# Patient Record
Sex: Female | Born: 1950 | Race: White | Hispanic: No | Marital: Single | Smoking: Former smoker
Health system: Southern US, Community
[De-identification: ages and names within clinical notes are randomized; demographics above are authoritative.]

## PROBLEM LIST (undated history)

## (undated) DIAGNOSIS — I77810 Thoracic aortic ectasia: Secondary | ICD-10-CM

## (undated) DIAGNOSIS — J309 Allergic rhinitis, unspecified: Secondary | ICD-10-CM

## (undated) DIAGNOSIS — M1990 Unspecified osteoarthritis, unspecified site: Secondary | ICD-10-CM

## (undated) DIAGNOSIS — I25119 Atherosclerotic heart disease of native coronary artery with unspecified angina pectoris: Secondary | ICD-10-CM

## (undated) DIAGNOSIS — K3189 Other diseases of stomach and duodenum: Secondary | ICD-10-CM

## (undated) DIAGNOSIS — K219 Gastro-esophageal reflux disease without esophagitis: Secondary | ICD-10-CM

## (undated) DIAGNOSIS — Z720 Tobacco use: Secondary | ICD-10-CM

## (undated) DIAGNOSIS — E782 Mixed hyperlipidemia: Secondary | ICD-10-CM

## (undated) DIAGNOSIS — F32A Depression, unspecified: Secondary | ICD-10-CM

## (undated) DIAGNOSIS — G8929 Other chronic pain: Secondary | ICD-10-CM

## (undated) DIAGNOSIS — I10 Essential (primary) hypertension: Secondary | ICD-10-CM

## (undated) DIAGNOSIS — I493 Ventricular premature depolarization: Secondary | ICD-10-CM

## (undated) DIAGNOSIS — H269 Unspecified cataract: Secondary | ICD-10-CM

## (undated) DIAGNOSIS — J449 Chronic obstructive pulmonary disease, unspecified: Secondary | ICD-10-CM

## (undated) DIAGNOSIS — M546 Pain in thoracic spine: Secondary | ICD-10-CM

## (undated) DIAGNOSIS — M545 Low back pain: Secondary | ICD-10-CM

## (undated) DIAGNOSIS — F329 Major depressive disorder, single episode, unspecified: Secondary | ICD-10-CM

## (undated) DIAGNOSIS — I359 Nonrheumatic aortic valve disorder, unspecified: Secondary | ICD-10-CM

## (undated) DIAGNOSIS — I2510 Atherosclerotic heart disease of native coronary artery without angina pectoris: Secondary | ICD-10-CM

## (undated) DIAGNOSIS — T4145XA Adverse effect of unspecified anesthetic, initial encounter: Secondary | ICD-10-CM

## (undated) DIAGNOSIS — J189 Pneumonia, unspecified organism: Secondary | ICD-10-CM

## (undated) DIAGNOSIS — G4700 Insomnia, unspecified: Secondary | ICD-10-CM

## (undated) DIAGNOSIS — IMO0002 Reserved for concepts with insufficient information to code with codable children: Secondary | ICD-10-CM

## (undated) DIAGNOSIS — E785 Hyperlipidemia, unspecified: Secondary | ICD-10-CM

## (undated) DIAGNOSIS — M542 Cervicalgia: Secondary | ICD-10-CM

## (undated) DIAGNOSIS — F419 Anxiety disorder, unspecified: Secondary | ICD-10-CM

## (undated) DIAGNOSIS — T7840XA Allergy, unspecified, initial encounter: Secondary | ICD-10-CM

## (undated) DIAGNOSIS — F411 Generalized anxiety disorder: Secondary | ICD-10-CM

## (undated) DIAGNOSIS — T380X5A Adverse effect of glucocorticoids and synthetic analogues, initial encounter: Secondary | ICD-10-CM

## (undated) DIAGNOSIS — N3281 Overactive bladder: Secondary | ICD-10-CM

## (undated) HISTORY — DX: Low back pain: M54.5

## (undated) HISTORY — DX: Major depressive disorder, single episode, unspecified: F32.9

## (undated) HISTORY — DX: Overactive bladder: N32.81

## (undated) HISTORY — DX: Chronic obstructive pulmonary disease, unspecified: J44.9

## (undated) HISTORY — DX: Cervicalgia: M54.2

## (undated) HISTORY — DX: Unspecified osteoarthritis, unspecified site: M19.90

## (undated) HISTORY — DX: Tobacco use: Z72.0

## (undated) HISTORY — DX: Atherosclerotic heart disease of native coronary artery without angina pectoris: I25.10

## (undated) HISTORY — DX: Depression, unspecified: F32.A

## (undated) HISTORY — DX: Nonrheumatic aortic valve disorder, unspecified: I35.9

## (undated) HISTORY — DX: Anxiety disorder, unspecified: F41.9

## (undated) HISTORY — DX: Essential (primary) hypertension: I10

## (undated) HISTORY — PX: EYE SURGERY: SHX253

## (undated) HISTORY — DX: Ventricular premature depolarization: I49.3

## (undated) HISTORY — DX: Pain in thoracic spine: M54.6

## (undated) HISTORY — DX: Insomnia, unspecified: G47.00

## (undated) HISTORY — DX: Gastro-esophageal reflux disease without esophagitis: K21.9

## (undated) HISTORY — DX: Mixed hyperlipidemia: E78.2

## (undated) HISTORY — DX: Hyperlipidemia, unspecified: E78.5

## (undated) HISTORY — DX: Other chronic pain: G89.29

## (undated) HISTORY — DX: Reserved for concepts with insufficient information to code with codable children: IMO0002

## (undated) HISTORY — DX: Unspecified cataract: H26.9

## (undated) HISTORY — DX: Thoracic aortic ectasia: I77.810

## (undated) HISTORY — DX: Allergy, unspecified, initial encounter: T78.40XA

## (undated) HISTORY — DX: Atherosclerotic heart disease of native coronary artery with unspecified angina pectoris: I25.119

## (undated) HISTORY — PX: CARPAL TUNNEL RELEASE: SHX101

## (undated) HISTORY — PX: BREAST BIOPSY: SHX20

## (undated) HISTORY — DX: Allergic rhinitis, unspecified: J30.9

## (undated) HISTORY — DX: Other diseases of stomach and duodenum: K31.89

## (undated) HISTORY — DX: Adverse effect of glucocorticoids and synthetic analogues, initial encounter: T38.0X5A

## (undated) HISTORY — DX: Generalized anxiety disorder: F41.1

---
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
## 2001-04-03 DIAGNOSIS — T8859XA Other complications of anesthesia, initial encounter: Secondary | ICD-10-CM

## 2001-04-03 HISTORY — DX: Other complications of anesthesia, initial encounter: T88.59XA

## 2001-04-03 HISTORY — PX: OTHER SURGICAL HISTORY: SHX169

## 2001-12-11 ENCOUNTER — Ambulatory Visit (HOSPITAL_BASED_OUTPATIENT_CLINIC_OR_DEPARTMENT_OTHER): Admission: RE | Admit: 2001-12-11 | Discharge: 2001-12-11 | Payer: Self-pay | Admitting: Orthopedic Surgery

## 2002-06-20 ENCOUNTER — Other Ambulatory Visit: Admission: RE | Admit: 2002-06-20 | Discharge: 2002-06-20 | Payer: Self-pay | Admitting: Family Medicine

## 2003-04-20 ENCOUNTER — Ambulatory Visit (HOSPITAL_COMMUNITY): Admission: RE | Admit: 2003-04-20 | Discharge: 2003-04-20 | Payer: Self-pay | Admitting: Family Medicine

## 2003-07-07 ENCOUNTER — Encounter: Admission: RE | Admit: 2003-07-07 | Discharge: 2003-07-07 | Payer: Self-pay | Admitting: Family Medicine

## 2003-08-18 ENCOUNTER — Other Ambulatory Visit: Admission: RE | Admit: 2003-08-18 | Discharge: 2003-08-18 | Payer: Self-pay | Admitting: Family Medicine

## 2004-09-26 ENCOUNTER — Encounter: Admission: RE | Admit: 2004-09-26 | Discharge: 2004-09-26 | Payer: Self-pay | Admitting: General Surgery

## 2004-09-28 ENCOUNTER — Ambulatory Visit (HOSPITAL_BASED_OUTPATIENT_CLINIC_OR_DEPARTMENT_OTHER): Admission: RE | Admit: 2004-09-28 | Discharge: 2004-09-28 | Payer: Self-pay | Admitting: General Surgery

## 2004-09-28 ENCOUNTER — Ambulatory Visit (HOSPITAL_COMMUNITY): Admission: RE | Admit: 2004-09-28 | Discharge: 2004-09-28 | Payer: Self-pay | Admitting: General Surgery

## 2004-09-29 ENCOUNTER — Encounter (INDEPENDENT_AMBULATORY_CARE_PROVIDER_SITE_OTHER): Payer: Self-pay | Admitting: *Deleted

## 2004-12-16 ENCOUNTER — Ambulatory Visit: Payer: Self-pay | Admitting: Family Medicine

## 2004-12-20 ENCOUNTER — Encounter: Admission: RE | Admit: 2004-12-20 | Discharge: 2004-12-20 | Payer: Self-pay | Admitting: Family Medicine

## 2005-01-02 ENCOUNTER — Ambulatory Visit: Payer: Self-pay | Admitting: Family Medicine

## 2005-06-21 ENCOUNTER — Ambulatory Visit: Payer: Self-pay | Admitting: Family Medicine

## 2005-06-29 ENCOUNTER — Ambulatory Visit: Payer: Self-pay | Admitting: Family Medicine

## 2006-02-06 ENCOUNTER — Ambulatory Visit: Payer: Self-pay | Admitting: Family Medicine

## 2006-05-29 ENCOUNTER — Ambulatory Visit: Payer: Self-pay | Admitting: Family Medicine

## 2006-07-12 ENCOUNTER — Ambulatory Visit: Payer: Self-pay | Admitting: Family Medicine

## 2006-07-12 LAB — CONVERTED CEMR LAB
ALT: 18 units/L (ref 0–40)
AST: 20 units/L (ref 0–37)
Albumin: 3.7 g/dL (ref 3.5–5.2)
Alkaline Phosphatase: 54 units/L (ref 39–117)
BUN: 13 mg/dL (ref 6–23)
Basophils Absolute: 0 10*3/uL (ref 0.0–0.1)
Basophils Relative: 0.2 % (ref 0.0–1.0)
Bilirubin, Direct: 0.1 mg/dL (ref 0.0–0.3)
CO2: 29 meq/L (ref 19–32)
Calcium: 8.7 mg/dL (ref 8.4–10.5)
Chloride: 109 meq/L (ref 96–112)
Cholesterol: 108 mg/dL (ref 0–200)
Creatinine, Ser: 0.6 mg/dL (ref 0.4–1.2)
Eosinophils Absolute: 0.2 10*3/uL (ref 0.0–0.6)
Eosinophils Relative: 1.9 % (ref 0.0–5.0)
GFR calc Af Amer: 133 mL/min
GFR calc non Af Amer: 110 mL/min
Glucose, Bld: 95 mg/dL (ref 70–99)
HCT: 39.9 % (ref 36.0–46.0)
HDL: 43.4 mg/dL (ref >39.0)
Hemoglobin: 13.7 g/dL (ref 12.0–15.0)
LDL Cholesterol: 49 mg/dL (ref 0–99)
Lymphocytes Relative: 40.2 % (ref 12.0–46.0)
MCHC: 34.4 g/dL (ref 30.0–36.0)
MCV: 94.3 fL (ref 78.0–100.0)
Monocytes Absolute: 0.6 10*3/uL (ref 0.2–0.7)
Monocytes Relative: 8 % (ref 3.0–11.0)
Neutro Abs: 4 10*3/uL (ref 1.4–7.7)
Neutrophils Relative %: 49.7 % (ref 43.0–77.0)
Platelets: 348 10*3/uL (ref 150–400)
Potassium: 3.3 meq/L — ABNORMAL LOW (ref 3.5–5.1)
RBC: 4.23 M/uL (ref 3.87–5.11)
RDW: 13.4 % (ref 11.5–14.6)
Sodium: 145 meq/L (ref 135–145)
TSH: 1.85 microintl units/mL (ref 0.35–5.50)
Total Bilirubin: 0.4 mg/dL (ref 0.3–1.2)
Total CHOL/HDL Ratio: 2.5
Total Protein: 6.7 g/dL (ref 6.0–8.3)
Triglycerides: 78 mg/dL (ref 0–149)
VLDL: 16 mg/dL (ref 0–40)
WBC: 8.1 10*3/uL (ref 4.5–10.5)

## 2007-03-13 ENCOUNTER — Encounter: Payer: Self-pay | Admitting: Family Medicine

## 2007-05-08 ENCOUNTER — Ambulatory Visit: Payer: Self-pay | Admitting: Family Medicine

## 2007-05-08 DIAGNOSIS — F321 Major depressive disorder, single episode, moderate: Secondary | ICD-10-CM

## 2007-05-08 DIAGNOSIS — J209 Acute bronchitis, unspecified: Secondary | ICD-10-CM | POA: Insufficient documentation

## 2007-05-08 DIAGNOSIS — E782 Mixed hyperlipidemia: Secondary | ICD-10-CM | POA: Insufficient documentation

## 2007-05-08 DIAGNOSIS — M1990 Unspecified osteoarthritis, unspecified site: Secondary | ICD-10-CM

## 2007-05-08 DIAGNOSIS — R51 Headache: Secondary | ICD-10-CM | POA: Insufficient documentation

## 2007-05-08 DIAGNOSIS — I10 Essential (primary) hypertension: Secondary | ICD-10-CM | POA: Insufficient documentation

## 2007-05-08 DIAGNOSIS — Z8711 Personal history of peptic ulcer disease: Secondary | ICD-10-CM | POA: Insufficient documentation

## 2007-05-08 DIAGNOSIS — J309 Allergic rhinitis, unspecified: Secondary | ICD-10-CM

## 2007-05-08 DIAGNOSIS — R519 Headache, unspecified: Secondary | ICD-10-CM | POA: Insufficient documentation

## 2007-05-08 DIAGNOSIS — K219 Gastro-esophageal reflux disease without esophagitis: Secondary | ICD-10-CM

## 2007-05-08 DIAGNOSIS — Z9189 Other specified personal risk factors, not elsewhere classified: Secondary | ICD-10-CM | POA: Insufficient documentation

## 2007-05-08 DIAGNOSIS — F17200 Nicotine dependence, unspecified, uncomplicated: Secondary | ICD-10-CM

## 2007-05-08 HISTORY — DX: Allergic rhinitis, unspecified: J30.9

## 2007-05-08 HISTORY — DX: Nicotine dependence, unspecified, uncomplicated: F17.200

## 2007-05-08 HISTORY — DX: Major depressive disorder, single episode, moderate: F32.1

## 2007-05-08 HISTORY — DX: Gastro-esophageal reflux disease without esophagitis: K21.9

## 2007-07-26 ENCOUNTER — Ambulatory Visit: Payer: Self-pay | Admitting: Family Medicine

## 2007-07-26 LAB — CONVERTED CEMR LAB
Bilirubin Urine: NEGATIVE
Blood in Urine, dipstick: NEGATIVE
Glucose, Urine, Semiquant: NEGATIVE
Ketones, urine, test strip: NEGATIVE
Nitrite: NEGATIVE
Protein, U semiquant: NEGATIVE
Specific Gravity, Urine: 1.015
Urobilinogen, UA: 0.2
WBC Urine, dipstick: NEGATIVE
pH: 5.5

## 2007-08-01 ENCOUNTER — Other Ambulatory Visit: Admission: RE | Admit: 2007-08-01 | Discharge: 2007-08-01 | Payer: Self-pay | Admitting: Family Medicine

## 2007-08-01 ENCOUNTER — Ambulatory Visit: Payer: Self-pay | Admitting: Family Medicine

## 2007-08-01 ENCOUNTER — Encounter: Payer: Self-pay | Admitting: Family Medicine

## 2007-08-01 LAB — CONVERTED CEMR LAB
ALT: 16 units/L (ref 0–35)
AST: 19 units/L (ref 0–37)
Albumin: 3.9 g/dL (ref 3.5–5.2)
Alkaline Phosphatase: 61 units/L (ref 39–117)
BUN: 16 mg/dL (ref 6–23)
Basophils Absolute: 0 10*3/uL (ref 0.0–0.1)
Basophils Relative: 0 % (ref 0.0–1.0)
Bilirubin, Direct: 0.1 mg/dL (ref 0.0–0.3)
CO2: 32 meq/L (ref 19–32)
Calcium: 9.2 mg/dL (ref 8.4–10.5)
Chloride: 106 meq/L (ref 96–112)
Cholesterol: 154 mg/dL (ref 0–200)
Creatinine, Ser: 0.7 mg/dL (ref 0.4–1.2)
Eosinophils Absolute: 0.2 10*3/uL (ref 0.0–0.7)
Eosinophils Relative: 2.6 % (ref 0.0–5.0)
GFR calc Af Amer: 111 mL/min
GFR calc non Af Amer: 91 mL/min
Glucose, Bld: 106 mg/dL — ABNORMAL HIGH (ref 70–99)
HCT: 41.3 % (ref 36.0–46.0)
HDL: 48.2 mg/dL (ref >39.0)
Hemoglobin: 14.2 g/dL (ref 12.0–15.0)
LDL Cholesterol: 74 mg/dL (ref 0–99)
Lymphocytes Relative: 35.3 % (ref 12.0–46.0)
MCHC: 34.2 g/dL (ref 30.0–36.0)
MCV: 92.6 fL (ref 78.0–100.0)
Monocytes Absolute: 0.6 10*3/uL (ref 0.1–1.0)
Monocytes Relative: 7.9 % (ref 3.0–12.0)
Neutro Abs: 4.1 10*3/uL (ref 1.4–7.7)
Neutrophils Relative %: 54.2 % (ref 43.0–77.0)
Platelets: 364 10*3/uL (ref 150–400)
Potassium: 4.1 meq/L (ref 3.5–5.1)
RBC: 4.46 M/uL (ref 3.87–5.11)
RDW: 13.8 % (ref 11.5–14.6)
Sodium: 144 meq/L (ref 135–145)
TSH: 1.86 microintl units/mL (ref 0.35–5.50)
Total Bilirubin: 0.5 mg/dL (ref 0.3–1.2)
Total CHOL/HDL Ratio: 3.2
Total Protein: 7.5 g/dL (ref 6.0–8.3)
Triglycerides: 159 mg/dL — ABNORMAL HIGH (ref 0–149)
VLDL: 32 mg/dL (ref 0–40)
WBC: 7.6 10*3/uL (ref 4.5–10.5)

## 2007-08-08 ENCOUNTER — Encounter: Payer: Self-pay | Admitting: Family Medicine

## 2008-10-07 ENCOUNTER — Telehealth: Payer: Self-pay | Admitting: Family Medicine

## 2008-11-16 ENCOUNTER — Ambulatory Visit: Payer: Self-pay | Admitting: Family Medicine

## 2008-11-19 LAB — CONVERTED CEMR LAB
ALT: 11 units/L (ref 0–35)
AST: 15 units/L (ref 0–37)
Albumin: 4 g/dL (ref 3.5–5.2)
Alkaline Phosphatase: 52 units/L (ref 39–117)
BUN: 18 mg/dL (ref 6–23)
Basophils Absolute: 0 10*3/uL (ref 0.0–0.1)
Basophils Relative: 0.4 % (ref 0.0–3.0)
Bilirubin, Direct: 0 mg/dL (ref 0.0–0.3)
CO2: 27 meq/L (ref 19–32)
Calcium: 9.2 mg/dL (ref 8.4–10.5)
Chloride: 108 meq/L (ref 96–112)
Cholesterol: 208 mg/dL — ABNORMAL HIGH (ref 0–200)
Creatinine, Ser: 0.7 mg/dL (ref 0.4–1.2)
Direct LDL: 130.9 mg/dL
Eosinophils Absolute: 0.3 10*3/uL (ref 0.0–0.7)
Eosinophils Relative: 3.8 % (ref 0.0–5.0)
GFR calc non Af Amer: 90.79 mL/min (ref >60)
Glucose, Bld: 105 mg/dL — ABNORMAL HIGH (ref 70–99)
HCT: 44.5 % (ref 36.0–46.0)
HDL: 47.8 mg/dL (ref >39.00)
Hemoglobin: 15.3 g/dL — ABNORMAL HIGH (ref 12.0–15.0)
Lymphocytes Relative: 33.2 % (ref 12.0–46.0)
Lymphs Abs: 2.3 10*3/uL (ref 0.7–4.0)
MCHC: 34.3 g/dL (ref 30.0–36.0)
MCV: 92.7 fL (ref 78.0–100.0)
Monocytes Absolute: 0.5 10*3/uL (ref 0.1–1.0)
Monocytes Relative: 6.7 % (ref 3.0–12.0)
Neutro Abs: 3.8 10*3/uL (ref 1.4–7.7)
Neutrophils Relative %: 55.9 % (ref 43.0–77.0)
Platelets: 315 10*3/uL (ref 150.0–400.0)
Potassium: 4 meq/L (ref 3.5–5.1)
RBC: 4.8 M/uL (ref 3.87–5.11)
RDW: 13.9 % (ref 11.5–14.6)
Sodium: 142 meq/L (ref 135–145)
TSH: 1.49 microintl units/mL (ref 0.35–5.50)
Total Bilirubin: 0.6 mg/dL (ref 0.3–1.2)
Total CHOL/HDL Ratio: 4
Total Protein: 7.5 g/dL (ref 6.0–8.3)
Triglycerides: 176 mg/dL — ABNORMAL HIGH (ref 0.0–149.0)
VLDL: 35.2 mg/dL (ref 0.0–40.0)
WBC: 6.9 10*3/uL (ref 4.5–10.5)

## 2008-11-25 ENCOUNTER — Ambulatory Visit: Payer: Self-pay | Admitting: Family Medicine

## 2008-11-27 LAB — CONVERTED CEMR LAB
Bilirubin Urine: NEGATIVE
Glucose, Urine, Semiquant: NEGATIVE
Ketones, urine, test strip: NEGATIVE
Nitrite: NEGATIVE
Specific Gravity, Urine: 1.015
Urobilinogen, UA: 0.2
pH: 7

## 2009-04-20 ENCOUNTER — Ambulatory Visit: Payer: Self-pay | Admitting: Family Medicine

## 2009-04-20 DIAGNOSIS — J1189 Influenza due to unidentified influenza virus with other manifestations: Secondary | ICD-10-CM

## 2009-12-20 ENCOUNTER — Ambulatory Visit: Payer: Self-pay | Admitting: Family Medicine

## 2009-12-20 LAB — CONVERTED CEMR LAB
Bilirubin Urine: NEGATIVE
Blood in Urine, dipstick: NEGATIVE
Glucose, Urine, Semiquant: NEGATIVE
Ketones, urine, test strip: NEGATIVE
Nitrite: NEGATIVE
Protein, U semiquant: NEGATIVE
Specific Gravity, Urine: 1.025
Urobilinogen, UA: 0.2
pH: 5.5

## 2009-12-22 LAB — CONVERTED CEMR LAB
ALT: 14 units/L (ref 0–35)
AST: 19 units/L (ref 0–37)
Albumin: 3.9 g/dL (ref 3.5–5.2)
Alkaline Phosphatase: 54 units/L (ref 39–117)
BUN: 19 mg/dL (ref 6–23)
Basophils Absolute: 0.1 10*3/uL (ref 0.0–0.1)
Basophils Relative: 1 % (ref 0.0–3.0)
Bilirubin, Direct: 0 mg/dL (ref 0.0–0.3)
CO2: 26 meq/L (ref 19–32)
Calcium: 9.1 mg/dL (ref 8.4–10.5)
Chloride: 107 meq/L (ref 96–112)
Cholesterol: 170 mg/dL (ref 0–200)
Creatinine, Ser: 0.7 mg/dL (ref 0.4–1.2)
Eosinophils Absolute: 0.2 10*3/uL (ref 0.0–0.7)
Eosinophils Relative: 2.7 % (ref 0.0–5.0)
GFR calc non Af Amer: 93.53 mL/min (ref >60)
Glucose, Bld: 105 mg/dL — ABNORMAL HIGH (ref 70–99)
HCT: 46.3 % — ABNORMAL HIGH (ref 36.0–46.0)
HDL: 55.2 mg/dL (ref >39.00)
Hemoglobin: 15.6 g/dL — ABNORMAL HIGH (ref 12.0–15.0)
LDL Cholesterol: 88 mg/dL (ref 0–99)
Lymphocytes Relative: 41.7 % (ref 12.0–46.0)
Lymphs Abs: 3.1 10*3/uL (ref 0.7–4.0)
MCHC: 33.7 g/dL (ref 30.0–36.0)
MCV: 93.4 fL (ref 78.0–100.0)
Monocytes Absolute: 0.5 10*3/uL (ref 0.1–1.0)
Monocytes Relative: 7.4 % (ref 3.0–12.0)
Neutro Abs: 3.5 10*3/uL (ref 1.4–7.7)
Neutrophils Relative %: 47.2 % (ref 43.0–77.0)
Platelets: 283 10*3/uL (ref 150.0–400.0)
Potassium: 4 meq/L (ref 3.5–5.1)
RBC: 4.96 M/uL (ref 3.87–5.11)
RDW: 15.1 % — ABNORMAL HIGH (ref 11.5–14.6)
Sodium: 144 meq/L (ref 135–145)
TSH: 1.88 microintl units/mL (ref 0.35–5.50)
Total Bilirubin: 0.4 mg/dL (ref 0.3–1.2)
Total CHOL/HDL Ratio: 3
Total Protein: 6.7 g/dL (ref 6.0–8.3)
Triglycerides: 135 mg/dL (ref 0.0–149.0)
VLDL: 27 mg/dL (ref 0.0–40.0)
WBC: 7.4 10*3/uL (ref 4.5–10.5)

## 2010-03-07 ENCOUNTER — Telehealth: Payer: Self-pay | Admitting: Family Medicine

## 2010-05-05 NOTE — Assessment & Plan Note (Signed)
Summary: FLU-LIKE SYMPTOMS // RS   Vital Signs:  Patient profile:   60 year old female Weight:      207 pounds Temp:     98.1 degrees F oral BP sitting:   122 / 92  (left arm) Cuff size:   large  Vitals Entered By: Alfred Levins, CMA (April 20, 2009 2:38 PM) CC: achy, fever, bilateral ear pain   History of Present Illness: Here for 3 days of diffuse body aches, fevers, and a dry cough. Some nausea but no vomitting.   Allergies: 1)  ! Vicodin 2)  ! Percocet  Past History:  Past Medical History: Reviewed history from 05/08/2007 and no changes required. Depression GERD Osteoarthritis Allergic rhinitis Headache Hyperlipidemia Hypertension insomnia overactive bladder DEXA 2002 normal  Review of Systems  The patient denies anorexia, weight loss, weight gain, vision loss, decreased hearing, hoarseness, chest pain, syncope, dyspnea on exertion, peripheral edema, hemoptysis, abdominal pain, melena, hematochezia, severe indigestion/heartburn, hematuria, incontinence, genital sores, muscle weakness, suspicious skin lesions, transient blindness, difficulty walking, depression, unusual weight change, abnormal bleeding, enlarged lymph nodes, angioedema, breast masses, and testicular masses.    Physical Exam  General:  Well-developed,well-nourished,in no acute distress; alert,appropriate and cooperative throughout examination Head:  Normocephalic and atraumatic without obvious abnormalities. No apparent alopecia or balding. Eyes:  No corneal or conjunctival inflammation noted. EOMI. Perrla. Funduscopic exam benign, without hemorrhages, exudates or papilledema. Vision grossly normal. Ears:  External ear exam shows no significant lesions or deformities.  Otoscopic examination reveals clear canals, tympanic membranes are intact bilaterally without bulging, retraction, inflammation or discharge. Hearing is grossly normal bilaterally. Nose:  External nasal examination shows no deformity  or inflammation. Nasal mucosa are pink and moist without lesions or exudates. Mouth:  Oral mucosa and oropharynx without lesions or exudates.  Teeth in good repair. Neck:  No deformities, masses, or tenderness noted. Lungs:  Normal respiratory effort, chest expands symmetrically. Lungs are clear to auscultation, no crackles or wheezes. Heart:  Normal rate and regular rhythm. S1 and S2 normal without gallop, murmur, click, rub or other extra sounds. Abdomen:  Bowel sounds positive,abdomen soft and non-tender without masses, organomegaly or hernias noted.   Impression & Recommendations:  Problem # 1:  INFLUENZA (ICD-487.8)  Complete Medication List: 1)  Avapro 300 Mg Tabs (Irbesartan) .... Once daily 2)  Norvasc 5 Mg Tabs (Amlodipine besylate) .Marland Kitchen.. 1 by mouth once daily 3)  Protonix 40 Mg Tbec (Pantoprazole sodium) .... Once daily 4)  Zoloft 100 Mg Tabs (Sertraline hcl) .... 2 by mouth once daily 5)  Zyrtec Allergy 10 Mg Tabs (Cetirizine hcl) .Marland Kitchen.. 1 once daily prn 6)  Darvocet-n 100 100-650 Mg Tabs (Propoxyphene n-apap) .Marland Kitchen.. 1 by mouth four times a day as needed 7)  Lasix 20 Mg Tabs (Furosemide) .Marland Kitchen.. 1 by mouth once daily as needed 8)  Cyclobenzaprine Hcl 10 Mg Tabs (Cyclobenzaprine hcl) .Marland Kitchen.. 1 two times a day as needed 9)  Ambien 10 Mg Tabs (Zolpidem tartrate) .Marland Kitchen.. 1 by mouth at bedtime as needed 10)  Nasonex 50 Mcg/act Susp (Mometasone furoate) .... 2 sprays each nostril once daily 11)  Potassium Chloride Cr 10 Meq Tbcr (Potassium chloride) .Marland Kitchen.. 1 by mouth once daily 12)  Inderal La 160 Mg Xr24h-cap (Propranolol hcl) .... Once daily 13)  Zocor 40 Mg Tabs (Simvastatin) .... Once daily 14)  Tamiflu 75 Mg Caps (Oseltamivir phosphate) .... Two times a day  Patient Instructions: 1)  Off work today. rest , fluids.  2)  Please schedule  a follow-up appointment as needed .  Prescriptions: TAMIFLU 75 MG CAPS (OSELTAMIVIR PHOSPHATE) two times a day  #10 x 0   Entered and Authorized by:    [name] MD   Signed by:   [name] MD on 04/20/2009   Method used:   Electronically to        [id number] (ret[facility])       [address]       [place], [place]  [address]       Ph: [phone] or [phone]       Fax: [phone]   RxID:   [id number]

## 2010-05-05 NOTE — Progress Notes (Signed)
Summary: refills  Phone Note Call from Patient   Summary of Call: Refill Inderal LA  and Zoloft to [facility])  Has no insurance to get CPX. [phone] Initial call taken by: [facility] CMA AAMA,  March 07, 2010 3:08 PM  Follow-up for Phone Call        call in 6 months of both. Hopefully she can come in for just an ROV next year to see how she is doing  Follow-up by: [name] MD,  March 07, 2010 3:59 PM  Additional Follow-up for Phone Call Additional follow up Details #1::        pt notified. Additional Follow-up by: Pura Spice, RN,  March 07, 2010 5:05 PM    Additional Follow-up for Phone Call Additional follow up Details #2::    number given not accepting mess. work number --no longer employed there and home number not in service  Follow-up by: Pura Spice, RN,  March 07, 2010 5:07 PM  New/Updated Medications: ZOLOFT 100 MG TABS (SERTRALINE HCL) 2 by mouth once daily INDERAL LA 160 MG XR24H-CAP (PROPRANOLOL HCL) once daily Prescriptions: INDERAL LA 160 MG XR24H-CAP (PROPRANOLOL HCL) once daily  #30 x 5   Entered by:   Pura Spice, RN   Authorized by:   [name] MD   Signed by:   Pura Spice, RN on 03/07/2010   Method used:   Electronically to        [id number] (ret[facility])       [address]       [place], [place]  [address]       Ph: [phone] or [phone]       Fax: [phone]   RxID:   [id number] ZOLOFT 100 MG TABS (SERTRALINE HCL) 2 by mouth once daily  #60 x 5   Entered by:   Pura Spice, RN   Authorized by:   [name] MD   Signed by:   Pura Spice, RN on 03/07/2010   Method used:   Electronically to        [id number] (retail)       [address]       [place], [place]  [address]       Ph: [phone] or [phone]       Fax: [phone]   RxID:   [id number]   Appended Document: refills pt notifed with above number.

## 2010-08-17 ENCOUNTER — Telehealth: Payer: Self-pay | Admitting: Family Medicine

## 2010-08-17 NOTE — Telephone Encounter (Signed)
Has been sick since last Wednesday, with a low grade fever,congestion and a cough. She is requesting [facility] Patient does not have insurance, and cannot afford to come in. Please advise.

## 2010-08-18 NOTE — Telephone Encounter (Signed)
Call in a Zpack  ?

## 2010-08-18 NOTE — Telephone Encounter (Signed)
rx called into pharmacy

## 2010-08-19 NOTE — Assessment & Plan Note (Signed)
[facility]                            [facility] OFFICE NOTE   NAME:[name], [name] [name]                       MRN:          [id number]  DATE:07/12/2006                            DOB:          15-Jul-1950    This is a 60 year old woman here for a nongynecological physical  examination. Generally she is doing well and has no complaints. She had  been seeing Dr. Katrinka Blazing for gynecologic examinations. Unfortunately, she  has not seen her since 2005. She did have a normal mammogram in 2007  otherwise she had a normal cardiac stress test in January 2005. She has  had several colonoscopies per Dr. Juanda Chance, the last one being in 2005,  some adenomatous polyps were removed at that time. A one year followup  colonoscopy was recommended by Dr. Juanda Chance, unfortunately she has not  been back since. On multiple occasions over the last several years, I  have tried to set her up for a repeat colonoscopy but for one reason or  another she keeps canceling it.   For further details of her past medical history, family history, social  history, habits, etc., refer to her last physical note dated January 02, 2005.   ALLERGIES:  HYDROCODONE, PERCOCET.   CURRENT MEDICATIONS:  1. Avapro 300 mg per day.  2. Norvasc 5 mg per day.  3. Inderal LA 160 mg per day.  4. Protonix 40 mg per day.  5. Celebrex 200 mg per day.  6. Lipitor 20 mg per day.  7. Zoloft 100 mg 2 tablets per day.  8. Zyrtec 10 mg per day as needed for allergies.  9. Darvocet as needed for joint pain.  10.Lasix 20 mg once a day as needed for edema.  11.Cyclobenzaprine 10 mg t.i.d. as needed for muscle spasms.  12.Ambien 10 mg q.h.s. as needed for sleep.   OBJECTIVE:  VITAL SIGNS:  Weight 211, blood pressure 150/92, pulse 80  and regular.  GENERAL:  She remains overweight.  SKIN:  Clear.  HEENT:  Eyes clear. Pharynx clear.  NECK:  Supple without lymphadenopathy or masses.  LUNGS:  Clear.  CARDIAC:  Rate and  rhythm regular without gallops, murmurs or rubs.  Distal pulses are full. EKG is within normal limits.  ABDOMEN:  Soft, normal bowel sounds, nontender, no masses.  EXTREMITIES:  No clubbing, cyanosis or edema.  NEUROLOGIC:  Grossly intact.   ASSESSMENT/PLAN:  1. Complete physical. Will send her for the usual laboratories today      and I encouraged her to lose weight.  2. Hyperlipidemia. Will check a fasting lipid panel today.  3. Health maintenance. I encouraged her to contact Dr. Michaelle Copas office      to get a GYN exam soon.  4. History of colon polyps. Will try yet again to set her up for a      repeat colonoscopy with Dr. Juanda Chance.  5. Hypertension. Probably stable. For some reason today it is up,      possibly because she has not taken her medications yet. She will      continue to watch  it closely at home.  6. Overactive bladder, stable. The patient stopped taking Vesicare at      one point because of dry mouth. At this point she simply wants to      put up with her symptoms.  7. Chronic back pain, stable.  8. Anxiety, stable.     Tera Mater. Clent Ridges, MD  Electronically Signed    SAF/MedQ  DD: 07/13/2006  DT: 07/13/2006  Job #: [id number]

## 2010-08-19 NOTE — Op Note (Signed)
[name] [name], [name] [name]                ACCOUNT NO.:  [id number]   MEDICAL RECORD NO.:  [id number]          PATIENT TYPE:  AMB   LOCATION:  [facility]                          FACILITY:  [facility]   PHYSICIAN:  Sharlet Salina T. Hoxworth, M.D.DATE OF BIRTH:  [birth year]   DATE OF PROCEDURE:  09/28/2004  DATE OF DISCHARGE:                                 OPERATIVE REPORT   PREOPERATIVE DIAGNOSIS:  Abnormal mammogram/intraductal papilloma left  breast.   PREOPERATIVE DIAGNOSIS:  Abnormal mammogram/intraductal papilloma left  breast.   SURGICAL PROCEDURES:  Needle localized left breast biopsy.   SURGEON:  Lorne Skeens. Hoxworth, M.D.   ANESTHESIA:  General.   BRIEF HISTORY:  Ms. Kallen is a 60 year old female followed by Dr. Jeralyn Ruths for small area calcifications in the left breast. Recent mammogram  so some increase in the calcifications a large core needle biopsy was  performed. 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 With this diagnosis, it was felt that complete excision of  the papilloma was recommended and the patient is in agreement. Following  successful needle localization by Dr. Yolanda Bonine, the patient was brought the  operating room for excision.   DESCRIPTION OF PROCEDURE:  Patient was brought to the operating room, placed  in the supine position on the operating table and general anesthesia was  induced. Left breast was sterilely prepped and draped. A curvilinear  incision was made at the wire insertion site in the lateral left breast and  dissection carried down through the subcutaneous tissue to the breast  capsule. A core of breast tissue was then excised around the shaft and tip  of the wire with cautery.  Specimen mammography was performed which showed complete excision of the  area. Hemostasis was obtained with cautery. The skin was then closed with  running subcuticular  4-0 Monocryl and Steri-Strips.  Sponge, needle and  instrument counts  were correct Dry sterile dressings were applied. The  patient taken to the recovery room in good condition.       BTH/MEDQ  D:  09/28/2004  T:  09/28/2004  Job:  [id number]   cc:   Netty Starring, MD

## 2010-09-07 ENCOUNTER — Telehealth: Payer: Self-pay | Admitting: Family Medicine

## 2010-09-07 NOTE — Telephone Encounter (Signed)
Pt called and said that [facility] sent a refill req on Monday, and have not rcvd response. Pt req refill of generic Zoloft 100 mg bid and generic Enderal 160 mg LA 1 a day. Pls call in today.

## 2010-09-08 ENCOUNTER — Telehealth: Payer: Self-pay | Admitting: *Deleted

## 2010-09-08 MED ORDER — SERTRALINE HCL 100 MG PO TABS: ORAL_TABLET | ORAL | Status: DC

## 2010-09-08 NOTE — Telephone Encounter (Signed)
Pt needs OV then I can call in 1 mth supply

## 2010-09-09 MED ORDER — PROPRANOLOL HCL ER 160 MG PO CP24: 160.0000 mg | ORAL_CAPSULE | Freq: Every day | ORAL | Status: DC

## 2010-09-09 MED ORDER — SERTRALINE HCL 100 MG PO TABS: 100.0000 mg | ORAL_TABLET | Freq: Two times a day (BID) | ORAL | Status: DC

## 2010-09-09 NOTE — Telephone Encounter (Signed)
Call in Zoloft 100 mg to take 2 tabs a day, #60 with 5 rf, also Inderal LA 160 mg per day, #30 with 5 rf. She will absolutely need to see me for  any more refills after that

## 2010-09-09 NOTE — Telephone Encounter (Signed)
Is this okay to fill? 

## 2010-09-09 NOTE — Telephone Encounter (Signed)
Pt called to check on status of Zoloft and Enderal LA. This needs to be called in to [facility] According to pts med list, zoloft was sent to incorrect pharmacy location.Pt unable to sch ov at this time, because she does not have insurance and can't afford ov. Meds are expensive enough.

## 2010-09-12 ENCOUNTER — Ambulatory Visit (INDEPENDENT_AMBULATORY_CARE_PROVIDER_SITE_OTHER): Payer: Self-pay | Admitting: Internal Medicine

## 2010-09-12 ENCOUNTER — Telehealth: Payer: Self-pay | Admitting: *Deleted

## 2010-09-12 ENCOUNTER — Encounter: Payer: Self-pay | Admitting: Internal Medicine

## 2010-09-12 DIAGNOSIS — I10 Essential (primary) hypertension: Secondary | ICD-10-CM

## 2010-09-12 DIAGNOSIS — F329 Major depressive disorder, single episode, unspecified: Secondary | ICD-10-CM

## 2010-09-12 MED ORDER — PROPRANOLOL HCL ER 160 MG PO CP24: 160.0000 mg | ORAL_CAPSULE | Freq: Every day | ORAL | Status: DC

## 2010-09-12 MED ORDER — LISINOPRIL-HYDROCHLOROTHIAZIDE 20-12.5 MG PO TABS: 1.0000 | ORAL_TABLET | Freq: Every day | ORAL | Status: DC

## 2010-09-12 MED ORDER — SERTRALINE HCL 100 MG PO TABS: 200.0000 mg | ORAL_TABLET | Freq: Every day | ORAL | Status: DC

## 2010-09-12 MED ORDER — SIMVASTATIN 40 MG PO TABS: 40.0000 mg | ORAL_TABLET | Freq: Every day | ORAL | Status: DC

## 2010-09-12 NOTE — Progress Notes (Signed)
  Subjective:    Patient ID: [name] [name], female    DOB: 12/04/1950, 60 y.o.   MRN: [id number]  HPI  60 year old patient who is seen today for followup of her hypertension she has a history of depression. She has been out of all her medications and is seen today basically for a medicine refill and office evaluation. She presently is without health insurance. She has been on multiple medications including Inderal. This was prescribed for migraine prophylaxis as well as for palpitations. She requests a refill on Inderal and Zoloft but due to cost concerns does not feel she can afford other medications. Blood pressure today is elevated but has been off all blood pressure medication for several days    Review of Systems  Constitutional: Negative.   HENT: Negative for hearing loss, congestion, sore throat, rhinorrhea, dental problem, sinus pressure and tinnitus.   Eyes: Negative for pain, discharge and visual disturbance.  Respiratory: Negative for cough and shortness of breath.   Cardiovascular: Negative for chest pain, palpitations and leg swelling.  Gastrointestinal: Negative for nausea, vomiting, abdominal pain, diarrhea, constipation, blood in stool and abdominal distention.  Genitourinary: Negative for dysuria, urgency, frequency, hematuria, flank pain, vaginal bleeding, vaginal discharge, difficulty urinating, vaginal pain and pelvic pain.  Musculoskeletal: Negative for joint swelling, arthralgias and gait problem.  Skin: Negative for rash.  Neurological: Negative for dizziness, syncope, speech difficulty, weakness, numbness and headaches.  Hematological: Negative for adenopathy.  Psychiatric/Behavioral: Negative for behavioral problems, dysphoric mood and agitation. The patient is not nervous/anxious.        Objective:   Physical Exam  Constitutional: She is oriented to person, place, and time. She appears well-developed and well-nourished.  HENT:  Head: Normocephalic.  Right Ear:  External ear normal.  Left Ear: External ear normal.  Mouth/Throat: Oropharynx is clear and moist.  Eyes: Conjunctivae and EOM are normal. Pupils are equal, round, and reactive to light.  Neck: Normal range of motion. Neck supple. No thyromegaly present.  Cardiovascular: Normal rate, regular rhythm, normal heart sounds and intact distal pulses.   Pulmonary/Chest: Effort normal and breath sounds normal.  Abdominal: Soft. Bowel sounds are normal. She exhibits no mass. There is no tenderness.  Musculoskeletal: Normal range of motion.  Lymphadenopathy:    She has no cervical adenopathy.  Neurological: She is alert and oriented to person, place, and time.  Skin: Skin is warm and dry. No rash noted.  Psychiatric: She has a normal mood and affect. Her behavior is normal.          Assessment & Plan:   Hypertension. We'll resume Inderal. Patient has required triple therapy in the past Will also provide a prescription for lisinopril hydrochlorothiazide Depression. We'll resume sertraline  Return office visit 3 months

## 2010-09-12 NOTE — Patient Instructions (Signed)
Limit your sodium (Salt) intake  Please check your blood pressure on a regular basis.  If it is consistently greater than 150/90, please make an office appointment.  Follow up with Dr. Clent Ridges next month

## 2010-09-12 NOTE — Telephone Encounter (Signed)
error 

## 2010-10-20 NOTE — Telephone Encounter (Signed)
Take care of

## 2011-10-03 ENCOUNTER — Other Ambulatory Visit: Payer: Self-pay | Admitting: Family Medicine

## 2011-10-06 ENCOUNTER — Other Ambulatory Visit: Payer: Self-pay

## 2011-10-06 MED ORDER — PROPRANOLOL HCL ER 160 MG PO CP24: 160.0000 mg | ORAL_CAPSULE | Freq: Every day | ORAL | Status: DC

## 2011-10-20 ENCOUNTER — Other Ambulatory Visit: Payer: Self-pay | Admitting: Internal Medicine

## 2011-10-20 NOTE — Telephone Encounter (Signed)
Can we refill this? 

## 2011-10-20 NOTE — Telephone Encounter (Signed)
Dr Fry pt 

## 2011-10-20 NOTE — Telephone Encounter (Signed)
Call in #30 only. She needs an OV  

## 2011-10-25 ENCOUNTER — Other Ambulatory Visit: Payer: Self-pay | Admitting: Family Medicine

## 2011-10-26 NOTE — Telephone Encounter (Signed)
Call in #30 only. She needs an OV  

## 2011-10-26 NOTE — Telephone Encounter (Signed)
Please see note from pharmacy

## 2011-10-27 ENCOUNTER — Other Ambulatory Visit: Payer: Self-pay | Admitting: Family Medicine

## 2011-10-30 ENCOUNTER — Telehealth: Payer: Self-pay | Admitting: Family Medicine

## 2011-10-30 NOTE — Telephone Encounter (Signed)
Pt called and said that she does not have health insurance and can not afford ov at this time. Pt req to get a 90 day supply of lisinopril-hydrochlorothiazide (PRINZIDE,ZESTORETIC) 20-12.5 MG per tablet to [facility] [address] [facility]  Pt says 90 day supply is only $12.00, instead of $30.00 for 30 day supply.

## 2011-10-31 ENCOUNTER — Other Ambulatory Visit: Payer: Self-pay | Admitting: Family Medicine

## 2011-10-31 NOTE — Telephone Encounter (Signed)
Can you call pt and give the below message? 

## 2011-10-31 NOTE — Telephone Encounter (Signed)
Called and lft vm that pt will need to sch ov, before any more refills can be given.

## 2011-10-31 NOTE — Telephone Encounter (Signed)
NO refills without an OV  

## 2011-11-06 ENCOUNTER — Encounter: Payer: Self-pay | Admitting: Family Medicine

## 2011-11-06 ENCOUNTER — Ambulatory Visit (INDEPENDENT_AMBULATORY_CARE_PROVIDER_SITE_OTHER): Payer: Self-pay | Admitting: Family Medicine

## 2011-11-06 VITALS — BP 148/94 | HR 79 | Temp 98.8°F | Wt 205.0 lb

## 2011-11-06 DIAGNOSIS — I10 Essential (primary) hypertension: Secondary | ICD-10-CM

## 2011-11-06 DIAGNOSIS — F411 Generalized anxiety disorder: Secondary | ICD-10-CM

## 2011-11-06 DIAGNOSIS — J449 Chronic obstructive pulmonary disease, unspecified: Secondary | ICD-10-CM

## 2011-11-06 DIAGNOSIS — F419 Anxiety disorder, unspecified: Secondary | ICD-10-CM

## 2011-11-06 MED ORDER — LISINOPRIL-HYDROCHLOROTHIAZIDE 20-12.5 MG PO TABS: 1.0000 | ORAL_TABLET | Freq: Every day | ORAL | Status: DC

## 2011-11-06 MED ORDER — METOPROLOL SUCCINATE ER 100 MG PO TB24: 100.0000 mg | ORAL_TABLET | Freq: Every day | ORAL | Status: DC

## 2011-11-06 MED ORDER — ALPRAZOLAM 1 MG PO TABS: 1.0000 mg | ORAL_TABLET | Freq: Two times a day (BID) | ORAL | Status: AC | PRN

## 2011-11-06 MED ORDER — ALBUTEROL SULFATE HFA 108 (90 BASE) MCG/ACT IN AERS: 2.0000 | INHALATION_SPRAY | Freq: Four times a day (QID) | RESPIRATORY_TRACT | Status: DC | PRN

## 2011-11-06 NOTE — Progress Notes (Signed)
  Subjective:    Patient ID: [name] [name], female    DOB: 1951-01-10, 61 y.o.   MRN: [id number]  HPI Here to follow up after not seeing me for 2 and 1/ 2 years. She has no insurance so she is trying not to come in. She ran out of BP meds a week ago, but she says they had been working well before then. She checks this at work and it usually runs 120s or 130s over 80s. She stopped taking Zoloft because it is too expensive, so she asks if we can try something on a prn basis. She [facility]. She still smokes and refuses to quit, abut she is getting more SOB all the time. She checks her oxygen at work and it runs 90-93 % most of the time. Again she refuses to get a CXR or any other type of testing for this.    Review of Systems  Constitutional: Negative.   Respiratory: Positive for shortness of breath. Negative for cough and wheezing.   Cardiovascular: Negative.   Psychiatric/Behavioral: The patient is nervous/anxious.        Objective:   Physical Exam  Constitutional: She is oriented to person, place, and time. She appears well-developed and well-nourished.  Neck: No thyromegaly present.  Cardiovascular: Normal rate, regular rhythm, normal heart sounds and intact distal pulses.   Pulmonary/Chest: Effort normal and breath sounds normal.  Lymphadenopathy:    She has no cervical adenopathy.  Neurological: She is alert and oriented to person, place, and time.  Psychiatric: She has a normal mood and affect.          Assessment & Plan:  She almost certainly has COPD but she will not quit smoking. I gave her a sample for Proair inhaler to use prn. Switched her HTN meds to lisinopril HCT and Metoprolol. Try Xanax prn. Recheck in one month

## 2011-11-28 ENCOUNTER — Telehealth: Payer: Self-pay | Admitting: Family Medicine

## 2011-11-28 NOTE — Telephone Encounter (Signed)
Pt would like to switch from Toprol XL to Metoprolol Tartrate due to cost savings.

## 2011-11-29 NOTE — Telephone Encounter (Signed)
Okay. Stop the Metoprolol succinate and switch to Metoprolol tartrate 50 mg bid. Call in one year supply

## 2011-11-30 MED ORDER — METOPROLOL TARTRATE 50 MG PO TABS: 50.0000 mg | ORAL_TABLET | Freq: Two times a day (BID) | ORAL | Status: DC

## 2011-11-30 NOTE — Telephone Encounter (Signed)
I sent new script e-scribe and I tried to call pt, no answer or option to leave a message.

## 2012-11-06 ENCOUNTER — Other Ambulatory Visit: Payer: Self-pay | Admitting: Family Medicine

## 2012-11-07 NOTE — Telephone Encounter (Signed)
Okay for 30 days only. Needs an OV  

## 2012-11-19 ENCOUNTER — Ambulatory Visit (INDEPENDENT_AMBULATORY_CARE_PROVIDER_SITE_OTHER): Payer: Self-pay | Admitting: Family Medicine

## 2012-11-19 ENCOUNTER — Encounter: Payer: Self-pay | Admitting: Family Medicine

## 2012-11-19 VITALS — BP 130/90 | HR 111 | Temp 98.5°F | Wt 192.0 lb

## 2012-11-19 DIAGNOSIS — I10 Essential (primary) hypertension: Secondary | ICD-10-CM

## 2012-11-19 DIAGNOSIS — M1990 Unspecified osteoarthritis, unspecified site: Secondary | ICD-10-CM

## 2012-11-19 LAB — CBC WITH DIFFERENTIAL/PLATELET
Basophils Absolute: 0.1 10*3/uL (ref 0.0–0.1)
Eosinophils Absolute: 0.2 10*3/uL (ref 0.0–0.7)
HCT: 45.3 % (ref 36.0–46.0)
Hemoglobin: 15.5 g/dL — ABNORMAL HIGH (ref 12.0–15.0)
Lymphs Abs: 4.4 10*3/uL — ABNORMAL HIGH (ref 0.7–4.0)
MCHC: 34.3 g/dL (ref 30.0–36.0)
Monocytes Relative: 6.7 % (ref 3.0–12.0)
Neutro Abs: 4.5 10*3/uL (ref 1.4–7.7)
RDW: 14.2 % (ref 11.5–14.6)

## 2012-11-19 LAB — BASIC METABOLIC PANEL
BUN: 20 mg/dL (ref 6–23)
CO2: 27 mEq/L (ref 19–32)
Chloride: 105 mEq/L (ref 96–112)
Glucose, Bld: 96 mg/dL (ref 70–99)
Potassium: 3.5 mEq/L (ref 3.5–5.1)

## 2012-11-19 MED ORDER — ALPRAZOLAM 1 MG PO TABS: 1.0000 mg | ORAL_TABLET | Freq: Two times a day (BID) | ORAL | Status: DC

## 2012-11-19 MED ORDER — METOPROLOL TARTRATE 50 MG PO TABS: 50.0000 mg | ORAL_TABLET | Freq: Three times a day (TID) | ORAL | Status: DC

## 2012-11-19 MED ORDER — LISINOPRIL-HYDROCHLOROTHIAZIDE 10-12.5 MG PO TABS: 1.0000 | ORAL_TABLET | Freq: Every day | ORAL | Status: DC

## 2012-11-19 MED ORDER — HYDROCODONE-ACETAMINOPHEN 5-325 MG PO TABS: 1.0000 | ORAL_TABLET | Freq: Four times a day (QID) | ORAL | Status: DC | PRN

## 2012-11-19 NOTE — Progress Notes (Signed)
  Subjective:    Patient ID: [name] [name], female    DOB: 19-Aug-1950, 62 y.o.   MRN: [id number]  HPI Here to follow up. Her BP has been stable but her heart rate remains high. Sometimes she feels her chest pounding. No chest pain or SOB. She asks for some pain meds for her neck and lower back. She still has no insurance and wants [place] to do the minimum of testing we can.    Review of Systems  Constitutional: Negative.   Respiratory: Negative.   Cardiovascular: Positive for palpitations. Negative for chest pain and leg swelling.       Objective:   Physical Exam  Constitutional: She appears well-developed and well-nourished.  Neck: No thyromegaly present.  Cardiovascular: Regular rhythm, normal heart sounds and intact distal pulses.   Rapid rate   Pulmonary/Chest: Effort normal and breath sounds normal.  Lymphadenopathy:    She has no cervical adenopathy.          Assessment & Plan:  We will increase the metoprolol to TID and decrease the lisinopril HCT to 10/12.5 daily. Get labs today

## 2013-01-14 ENCOUNTER — Telehealth: Payer: Self-pay | Admitting: Family Medicine

## 2013-01-14 MED ORDER — HYDROCODONE-ACETAMINOPHEN 5-325 MG PO TABS: 1.0000 | ORAL_TABLET | Freq: Four times a day (QID) | ORAL | Status: DC | PRN

## 2013-01-14 NOTE — Telephone Encounter (Signed)
Called and spoke with pt and rx is ready for pick up.

## 2013-01-14 NOTE — Telephone Encounter (Signed)
Rx last filled 11/19/12 #60 x 2rf.  Pt last seen 11/19/2012. Pls advise.

## 2013-01-14 NOTE — Telephone Encounter (Signed)
Pt would like a refill of HYDROcodone-acetaminophen (NORCO/VICODIN) 5-325 MG per tablet Need hard copy

## 2013-01-14 NOTE — Telephone Encounter (Signed)
Done

## 2013-04-09 ENCOUNTER — Telehealth: Payer: Self-pay | Admitting: Family Medicine

## 2013-04-09 NOTE — Telephone Encounter (Signed)
Requesting refill of HYDROcodone-acetaminophen (NORCO/VICODIN) 5-325 MG per tablet

## 2013-04-10 MED ORDER — HYDROCODONE-ACETAMINOPHEN 5-325 MG PO TABS: 1.0000 | ORAL_TABLET | Freq: Four times a day (QID) | ORAL | Status: DC | PRN

## 2013-04-10 NOTE — Telephone Encounter (Signed)
Done but she needs testing and a contract  

## 2013-04-10 NOTE — Addendum Note (Signed)
Addended by: Alysia Penna A on: 04/10/2013 12:23 PM   Modules accepted: Orders

## 2013-04-11 NOTE — Telephone Encounter (Signed)
Script is ready for pick up, contract printed and I left a voice message for pt. 

## 2013-05-07 ENCOUNTER — Encounter: Payer: Self-pay | Admitting: Family Medicine

## 2013-06-27 ENCOUNTER — Encounter: Payer: Self-pay | Admitting: Family Medicine

## 2013-06-27 ENCOUNTER — Ambulatory Visit (INDEPENDENT_AMBULATORY_CARE_PROVIDER_SITE_OTHER): Payer: No Typology Code available for payment source | Admitting: Family Medicine

## 2013-06-27 ENCOUNTER — Telehealth: Payer: Self-pay | Admitting: Family Medicine

## 2013-06-27 VITALS — BP 140/86 | HR 105 | Temp 99.4°F | Ht 61.0 in | Wt 182.0 lb

## 2013-06-27 DIAGNOSIS — I10 Essential (primary) hypertension: Secondary | ICD-10-CM

## 2013-06-27 DIAGNOSIS — E785 Hyperlipidemia, unspecified: Secondary | ICD-10-CM

## 2013-06-27 DIAGNOSIS — I499 Cardiac arrhythmia, unspecified: Secondary | ICD-10-CM

## 2013-06-27 DIAGNOSIS — M1990 Unspecified osteoarthritis, unspecified site: Secondary | ICD-10-CM

## 2013-06-27 LAB — BASIC METABOLIC PANEL
BUN: 16 mg/dL (ref 6–23)
CALCIUM: 9.5 mg/dL (ref 8.4–10.5)
CO2: 28 meq/L (ref 19–32)
CREATININE: 0.9 mg/dL (ref 0.4–1.2)
Chloride: 98 mEq/L (ref 96–112)
GFR: 69.59 mL/min (ref >60.00)
GLUCOSE: 98 mg/dL (ref 70–99)
Potassium: 3.6 mEq/L (ref 3.5–5.1)
Sodium: 137 mEq/L (ref 135–145)

## 2013-06-27 LAB — CBC WITH DIFFERENTIAL/PLATELET
BASOS ABS: 0.1 10*3/uL (ref 0.0–0.1)
BASOS PCT: 0.7 % (ref 0.0–3.0)
Eosinophils Absolute: 0.2 10*3/uL (ref 0.0–0.7)
Eosinophils Relative: 1.9 % (ref 0.0–5.0)
HEMATOCRIT: 48.7 % — AB (ref 36.0–46.0)
HEMOGLOBIN: 16.3 g/dL — AB (ref 12.0–15.0)
LYMPHS ABS: 4.2 10*3/uL — AB (ref 0.7–4.0)
LYMPHS PCT: 42.7 % (ref 12.0–46.0)
MCHC: 33.5 g/dL (ref 30.0–36.0)
MCV: 93.4 fl (ref 78.0–100.0)
MONOS PCT: 6.2 % (ref 3.0–12.0)
Monocytes Absolute: 0.6 10*3/uL (ref 0.1–1.0)
Neutro Abs: 4.8 10*3/uL (ref 1.4–7.7)
Neutrophils Relative %: 48.5 % (ref 43.0–77.0)
Platelets: 304 10*3/uL (ref 150.0–400.0)
RBC: 5.22 Mil/uL — AB (ref 3.87–5.11)
RDW: 13.5 % (ref 11.5–14.6)
WBC: 9.9 10*3/uL (ref 4.5–10.5)

## 2013-06-27 LAB — POCT URINALYSIS DIPSTICK
Bilirubin, UA: NEGATIVE
Blood, UA: NEGATIVE
GLUCOSE UA: NEGATIVE
KETONES UA: NEGATIVE
Nitrite, UA: NEGATIVE
PROTEIN UA: NEGATIVE
SPEC GRAV UA: 1.015
Urobilinogen, UA: 0.2
pH, UA: 7

## 2013-06-27 LAB — HEPATIC FUNCTION PANEL
ALT: 11 U/L (ref 0–35)
AST: 15 U/L (ref 0–37)
Albumin: 4.3 g/dL (ref 3.5–5.2)
Alkaline Phosphatase: 47 U/L (ref 39–117)
Bilirubin, Direct: 0 mg/dL (ref 0.0–0.3)
TOTAL PROTEIN: 7.6 g/dL (ref 6.0–8.3)
Total Bilirubin: 0.5 mg/dL (ref 0.3–1.2)

## 2013-06-27 LAB — LIPID PANEL
Cholesterol: 256 mg/dL — ABNORMAL HIGH (ref 0–200)
HDL: 54.1 mg/dL (ref >39.00)
LDL Cholesterol: 168 mg/dL — ABNORMAL HIGH (ref 0–99)
TRIGLYCERIDES: 168 mg/dL — AB (ref 0.0–149.0)
Total CHOL/HDL Ratio: 5
VLDL: 33.6 mg/dL (ref 0.0–40.0)

## 2013-06-27 LAB — TSH: TSH: 0.49 u[IU]/mL (ref 0.35–5.50)

## 2013-06-27 MED ORDER — PROPRANOLOL HCL ER 160 MG PO CP24: 160.0000 mg | ORAL_CAPSULE | Freq: Every day | ORAL | Status: DC

## 2013-06-27 MED ORDER — HYDROCODONE-ACETAMINOPHEN 5-325 MG PO TABS: 1.0000 | ORAL_TABLET | Freq: Four times a day (QID) | ORAL | Status: DC | PRN

## 2013-06-27 MED ORDER — ALPRAZOLAM 1 MG PO TABS: 1.0000 mg | ORAL_TABLET | Freq: Two times a day (BID) | ORAL | Status: DC

## 2013-06-27 NOTE — Progress Notes (Signed)
   Subjective:    Patient ID: [name] [name], female    DOB: October 02, 1950, 63 y.o.   MRN: [id number]  HPI Here asking about her BP and heart rate. She had been on Propranolol LA some years ago and she did very well. Then when she lost her insurance she could no longer afford this so we switched to Metoprolol. Ever since she has had labile BP which can drop low at times making her feel weak and tired. Also her heart rate is labile, and it may go up to 120 and then drop to the 40s. She now has medical insurance again and asks of she could go back on propranolol.    Review of Systems  Constitutional: Negative.   Respiratory: Negative.   Cardiovascular: Positive for palpitations. Negative for chest pain and leg swelling.       Objective:   Physical Exam  Constitutional: She appears well-developed and well-nourished. No distress.  Cardiovascular: Normal heart sounds and intact distal pulses.  Exam reveals no gallop and no friction rub.   No murmur heard. Rate is rapid and she has occasional ectopy. Her EKG is normal except for occasional PVCs.   Pulmonary/Chest: Effort normal and breath sounds normal.          Assessment & Plan:  Get las. Switch from Metoprolol to Propranolol ER 160 mg daily. Recheck in 2 weeks

## 2013-06-27 NOTE — Telephone Encounter (Signed)
Relevant patient education mailed to patient.  

## 2013-06-27 NOTE — Progress Notes (Signed)
Pre visit review using our clinic review tool, if applicable. No additional management support is needed unless otherwise documented below in the visit note. 

## 2013-06-28 ENCOUNTER — Telehealth: Payer: Self-pay | Admitting: Family Medicine

## 2013-06-28 NOTE — Telephone Encounter (Signed)
Relevant patient education mailed to patient.  

## 2013-07-14 ENCOUNTER — Ambulatory Visit (INDEPENDENT_AMBULATORY_CARE_PROVIDER_SITE_OTHER): Payer: No Typology Code available for payment source | Admitting: Family Medicine

## 2013-07-14 ENCOUNTER — Encounter: Payer: Self-pay | Admitting: Family Medicine

## 2013-07-14 ENCOUNTER — Telehealth: Payer: Self-pay | Admitting: Family Medicine

## 2013-07-14 ENCOUNTER — Encounter: Payer: Self-pay | Admitting: Internal Medicine

## 2013-07-14 VITALS — BP 130/80 | HR 93 | Temp 98.5°F | Ht 60.5 in | Wt 182.0 lb

## 2013-07-14 DIAGNOSIS — Z0000 Encounter for general adult medical examination without abnormal findings: Secondary | ICD-10-CM

## 2013-07-14 DIAGNOSIS — I10 Essential (primary) hypertension: Secondary | ICD-10-CM

## 2013-07-14 DIAGNOSIS — R0989 Other specified symptoms and signs involving the circulatory and respiratory systems: Secondary | ICD-10-CM

## 2013-07-14 MED ORDER — ATORVASTATIN CALCIUM 20 MG PO TABS: 20.0000 mg | ORAL_TABLET | Freq: Every day | ORAL | Status: DC

## 2013-07-14 MED ORDER — HYDROCODONE-ACETAMINOPHEN 5-325 MG PO TABS: 1.0000 | ORAL_TABLET | Freq: Four times a day (QID) | ORAL | Status: DC | PRN

## 2013-07-14 MED ORDER — HYDROCODONE-ACETAMINOPHEN 5-325 MG PO TABS
1.0000 | ORAL_TABLET | Freq: Four times a day (QID) | ORAL | Status: DC | PRN
Start: 2013-07-14 — End: 2013-07-14

## 2013-07-14 NOTE — Progress Notes (Signed)
Pre visit review using our clinic review tool, if applicable. No additional management support is needed unless otherwise documented below in the visit note. 

## 2013-07-14 NOTE — Progress Notes (Signed)
   Subjective:    Patient ID: [name] [name], female    DOB: 08-23-1950, 63 y.o.   MRN: [id number]  HPI 63 yr old female for a cpx. She has a few issues. We recently put he ron Propranolol ER and this has worked better to control her heart rate and BP, but she still has drops in rate to the 40s at times. She tolerates this well but it concerns her. Her labs show her LDL has jumped to 168 despite eating a healthy diet. She still smokes around 1/2 ppd. She still has daily low back pain.    Review of Systems  Constitutional: Negative.   HENT: Negative.   Eyes: Negative.   Respiratory: Negative.   Cardiovascular: Negative.   Gastrointestinal: Negative.   Genitourinary: Negative for dysuria, urgency, frequency, hematuria, flank pain, decreased urine volume, enuresis, difficulty urinating, pelvic pain and dyspareunia.  Musculoskeletal: Positive for back pain. Negative for arthralgias, gait problem, joint swelling, myalgias, neck pain and neck stiffness.  Skin: Negative.   Neurological: Negative.   Psychiatric/Behavioral: Negative.        Objective:   Physical Exam  Constitutional: She is oriented to person, place, and time. She appears well-developed and well-nourished. No distress.  HENT:  Head: Normocephalic and atraumatic.  Right Ear: External ear normal.  Left Ear: External ear normal.  Nose: Nose normal.  Mouth/Throat: Oropharynx is clear and moist. No oropharyngeal exudate.  Eyes: Conjunctivae and EOM are normal. Pupils are equal, round, and reactive to light. No scleral icterus.  Neck: Normal range of motion. Neck supple. No JVD present. No thyromegaly present.  Cardiovascular: Normal rate, regular rhythm, normal heart sounds and intact distal pulses.  Exam reveals no gallop and no friction rub.   No murmur heard. Pulmonary/Chest: Effort normal. No respiratory distress. She has no wheezes. She has no rales. She exhibits no tenderness.  Scattered rhonchi   Abdominal: Soft. Bowel  sounds are normal. She exhibits no distension and no mass. There is no tenderness. There is no rebound and no guarding.  Musculoskeletal: Normal range of motion. She exhibits no edema and no tenderness.  Lymphadenopathy:    She has no cervical adenopathy.  Neurological: She is alert and oriented to person, place, and time. She has normal reflexes. No cranial nerve deficit. She exhibits normal muscle tone. Coordination normal.  Skin: Skin is warm and dry. No rash noted. No erythema.  Psychiatric: She has a normal mood and affect. Her behavior is normal. Judgment and thought content normal.          Assessment & Plan:  Well exam. She has not had a CXR for 9 years so we will set one up. I urged her to quit smoking, and she agreed to try. Set up a colonoscopy. Refer to  Cardiology. Start on Lipitor.

## 2013-07-14 NOTE — Telephone Encounter (Signed)
Relevant patient education mailed to patient.  

## 2013-07-16 ENCOUNTER — Ambulatory Visit (INDEPENDENT_AMBULATORY_CARE_PROVIDER_SITE_OTHER)
Admission: RE | Admit: 2013-07-16 | Discharge: 2013-07-16 | Disposition: A | Discharge status: Home / Self Care | Payer: No Typology Code available for payment source | Source: Ambulatory Visit | Attending: Family Medicine | Admitting: Family Medicine

## 2013-07-16 DIAGNOSIS — R0989 Other specified symptoms and signs involving the circulatory and respiratory systems: Secondary | ICD-10-CM

## 2013-08-12 ENCOUNTER — Ambulatory Visit (INDEPENDENT_AMBULATORY_CARE_PROVIDER_SITE_OTHER): Payer: No Typology Code available for payment source | Admitting: Cardiovascular Disease

## 2013-08-12 ENCOUNTER — Encounter: Payer: Self-pay | Admitting: Cardiovascular Disease

## 2013-08-12 VITALS — BP 114/72 | HR 50 | Ht 61.5 in | Wt 181.0 lb

## 2013-08-12 DIAGNOSIS — I4949 Other premature depolarization: Secondary | ICD-10-CM

## 2013-08-12 DIAGNOSIS — I493 Ventricular premature depolarization: Secondary | ICD-10-CM

## 2013-08-12 DIAGNOSIS — R0600 Dyspnea, unspecified: Secondary | ICD-10-CM

## 2013-08-12 DIAGNOSIS — R0989 Other specified symptoms and signs involving the circulatory and respiratory systems: Secondary | ICD-10-CM

## 2013-08-12 DIAGNOSIS — R002 Palpitations: Secondary | ICD-10-CM

## 2013-08-12 DIAGNOSIS — R0609 Other forms of dyspnea: Secondary | ICD-10-CM

## 2013-08-12 DIAGNOSIS — R079 Chest pain, unspecified: Secondary | ICD-10-CM

## 2013-08-12 NOTE — Patient Instructions (Signed)
Your physician recommends that you schedule a follow-up appointment in:  About 6 weeks.   Your physician has requested that you have an echocardiogram. Echocardiography is a painless test that uses sound waves to create images of your heart. It provides your doctor with information about the size and shape of your heart and how well your heart's chambers and valves are working. This procedure takes approximately one hour. There are no restrictions for this procedure.   Your physician has recommended that you wear an event monitor. Event monitors are medical devices that record the heart's electrical activity. Doctors most often us these monitors to diagnose arrhythmias. Arrhythmias are problems with the speed or rhythm of the heartbeat. The monitor is a small, portable device. You can wear one while you do your normal daily activities. This is usually used to diagnose what is causing palpitations/syncope (passing out).   

## 2013-08-12 NOTE — Progress Notes (Signed)
/    History of Present Illness: 63 yo female with history of HTN, HLD, GERD, OA, depression, ongoing tobacco abuse, palpitations here today as a new patient for cardiovascular examination and risk assessment. She tells me that she has been on a beta blocker for years. She had been on metoprolol in the past and now is on Propranolol. She tells me that she has been having episodes of weakness and feels that her heart rate is slow. Her HR has been as low as 40 bpm at home. She has noted HR of 120 at rest at home. She has no prior documented CAD. She had a stress test remotely. She tells me that she has dyspnea with exertion. She also has exertional chest pain occasionally but not more than once per month.  EKG from 06/27/13 with sinus, PVCs. No LE edema.   Primary Care Physician: Sharlene Motts  Last Lipid Profile:Lipid Panel     Component Value Date/Time   CHOL 256* 06/27/2013 1011   TRIG 168.0* 06/27/2013 1011   HDL 54.10 06/27/2013 1011   CHOLHDL 5 06/27/2013 1011   VLDL 33.6 06/27/2013 1011   LDLCALC 168* 06/27/2013 1011     Past Medical History  Diagnosis Date  . Depression   . GERD (gastroesophageal reflux disease)   . Osteoarthritis   . Allergic rhinitis   . Headache(784.0)   . Hyperlipidemia   . Hypertension   . Insomnia   . Overactive bladder   . Dexamethasone adverse reaction     2002 normal    Past Surgical History  Procedure Laterality Date  . Tonsillectomy    . Carpal tunnel release    . Torn cartilage repaired rt wrist  2003  . Colonoscopy  2005    per Dr. Olevia Perches, polpys, repeat in 5 years    Current Outpatient Prescriptions  Medication Sig Dispense Refill  . ALPRAZolam (XANAX) 1 MG tablet Take 1 tablet (1 mg total) by mouth 2 (two) times daily.  60 tablet  5  . atorvastatin (LIPITOR) 20 MG tablet Take 1 tablet (20 mg total) by mouth daily.  30 tablet  11  . cetirizine (ZYRTEC) 10 MG chewable tablet Chew 10 mg by mouth daily.      Marland Kitchen HYDROcodone-acetaminophen  (NORCO/VICODIN) 5-325 MG per tablet Take 1 tablet by mouth every 6 (six) hours as needed for moderate pain.  100 tablet  0  . lisinopril-hydrochlorothiazide (PRINZIDE,ZESTORETIC) 10-12.5 MG per tablet Take 1 tablet by mouth daily.  90 tablet  3  . propranolol ER (INDERAL LA) 160 MG SR capsule Take 1 capsule (160 mg total) by mouth daily.  30 capsule  11  . albuterol (PROVENTIL HFA;VENTOLIN HFA) 108 (90 BASE) MCG/ACT inhaler Inhale 2 puffs into the lungs every 6 (six) hours as needed for wheezing.  1 Inhaler  0   No current facility-administered medications for this visit.    Allergies  Allergen Reactions  . Oxycodone-Acetaminophen     REACTION: n/v    History   Social History  . Marital Status: Single    Spouse Name: N/A    Number of Children: 0  . Years of Education: N/A   Occupational History  . CNA    Social History Main Topics  . Smoking status: Current Every Day Smoker -- 1.00 packs/day for 52 years    Types: Cigarettes  . Smokeless tobacco: Never Used     Comment: 1/2 pack per day or less  . Alcohol Use: No  . Drug Use:  No  . Sexual Activity: Not on file   Other Topics Concern  . Not on file   Social History Narrative  . No narrative on file    Family History  Problem Relation Age of Onset  . Hyperlipidemia    . Hypertension    . Kidney disease    . CAD Brother   . Emphysema Mother     Review of Systems:  As stated in the HPI and otherwise negative.   BP 114/72  Pulse 50  Ht 5' 1.5" (1.562 m)  Wt 181 lb (82.101 kg)  BMI 33.65 kg/m2  Physical Examination: General: Well developed, well nourished, NAD HEENT: OP clear, mucus membranes moist SKIN: warm, dry. No rashes. Neuro: No focal deficits Musculoskeletal: Muscle strength 5/5 all ext Psychiatric: Mood and affect normal Neck: No JVD, no carotid bruits, no thyromegaly, no lymphadenopathy. Lungs:Clear bilaterally, no wheezes, rhonci, crackles Cardiovascular: Regular rate and rhythm. No murmurs,  gallops or rubs. Abdomen:Soft. Bowel sounds present. Non-tender.  Extremities: No lower extremity edema. Pulses are 2 + in the bilateral DP/PT.  EKG: Sinus, PVCs.   Assessment and Plan:   1. Palpitations/PVCs: She describes episodes of tachycardia with palpitations and episodes of bradycardia with weakness. Will arrange 30 day cardiac monitor to assess for bradycardia and tachycardia, exclude arrythmias. Will arrange echo to assess LV size and function.   2. Dyspnea/chest pain: Will assess LV function with echo. She has risk factors for CAD including 50 pack year tobacco abuse, HTN, HLD. I discussed arranging a stress test but she refuses.

## 2013-08-14 ENCOUNTER — Encounter: Payer: Self-pay | Admitting: *Deleted

## 2013-08-14 ENCOUNTER — Encounter (INDEPENDENT_AMBULATORY_CARE_PROVIDER_SITE_OTHER): Payer: No Typology Code available for payment source

## 2013-08-14 DIAGNOSIS — I4949 Other premature depolarization: Secondary | ICD-10-CM

## 2013-08-14 DIAGNOSIS — R002 Palpitations: Secondary | ICD-10-CM

## 2013-08-14 DIAGNOSIS — I493 Ventricular premature depolarization: Secondary | ICD-10-CM

## 2013-08-14 NOTE — Progress Notes (Signed)
Patient ID: [name] [name], female   DOB: [birth year], 63 y.o.   MRN: [id number] E-Cardio verite 30 day cardiac event monitor applied to patient.

## 2013-08-27 ENCOUNTER — Encounter: Payer: No Typology Code available for payment source | Admitting: Internal Medicine

## 2013-09-03 ENCOUNTER — Ambulatory Visit (HOSPITAL_COMMUNITY): Payer: No Typology Code available for payment source | Attending: Cardiology | Admitting: Radiology

## 2013-09-03 DIAGNOSIS — I77819 Aortic ectasia, unspecified site: Secondary | ICD-10-CM | POA: Insufficient documentation

## 2013-09-03 DIAGNOSIS — E785 Hyperlipidemia, unspecified: Secondary | ICD-10-CM | POA: Insufficient documentation

## 2013-09-03 DIAGNOSIS — R0609 Other forms of dyspnea: Secondary | ICD-10-CM | POA: Insufficient documentation

## 2013-09-03 DIAGNOSIS — F17200 Nicotine dependence, unspecified, uncomplicated: Secondary | ICD-10-CM | POA: Insufficient documentation

## 2013-09-03 DIAGNOSIS — R002 Palpitations: Secondary | ICD-10-CM | POA: Insufficient documentation

## 2013-09-03 DIAGNOSIS — I10 Essential (primary) hypertension: Secondary | ICD-10-CM | POA: Insufficient documentation

## 2013-09-03 DIAGNOSIS — R0989 Other specified symptoms and signs involving the circulatory and respiratory systems: Secondary | ICD-10-CM | POA: Insufficient documentation

## 2013-09-03 DIAGNOSIS — R079 Chest pain, unspecified: Secondary | ICD-10-CM

## 2013-09-03 DIAGNOSIS — R0600 Dyspnea, unspecified: Secondary | ICD-10-CM

## 2013-09-03 DIAGNOSIS — I4949 Other premature depolarization: Secondary | ICD-10-CM | POA: Insufficient documentation

## 2013-09-03 NOTE — Progress Notes (Signed)
Echocardiogram performed.  

## 2013-09-04 ENCOUNTER — Telehealth: Payer: Self-pay | Admitting: Cardiovascular Disease

## 2013-09-04 DIAGNOSIS — R079 Chest pain, unspecified: Secondary | ICD-10-CM

## 2013-09-04 DIAGNOSIS — R0989 Other specified symptoms and signs involving the circulatory and respiratory systems: Principal | ICD-10-CM

## 2013-09-04 DIAGNOSIS — R0609 Other forms of dyspnea: Secondary | ICD-10-CM

## 2013-09-04 NOTE — Telephone Encounter (Signed)
Spoke with pt and she would like to proceed with cardiac CT prior to follow up appt with Dr. Angelena Form. She will come in for BMP early next week.

## 2013-09-04 NOTE — Telephone Encounter (Signed)
New Message  Pt called states that she is ready to complete the CT scan. Please call back to discuss.

## 2013-09-08 ENCOUNTER — Other Ambulatory Visit: Payer: No Typology Code available for payment source

## 2013-09-10 ENCOUNTER — Encounter: Payer: No Typology Code available for payment source | Admitting: Internal Medicine

## 2013-09-17 ENCOUNTER — Encounter: Payer: Self-pay | Admitting: Cardiovascular Disease

## 2013-09-18 ENCOUNTER — Ambulatory Visit (INDEPENDENT_AMBULATORY_CARE_PROVIDER_SITE_OTHER): Payer: No Typology Code available for payment source | Admitting: *Deleted

## 2013-09-18 DIAGNOSIS — R0989 Other specified symptoms and signs involving the circulatory and respiratory systems: Principal | ICD-10-CM

## 2013-09-18 DIAGNOSIS — R0609 Other forms of dyspnea: Secondary | ICD-10-CM

## 2013-09-18 LAB — BASIC METABOLIC PANEL
BUN: 13 mg/dL (ref 6–23)
CHLORIDE: 104 meq/L (ref 96–112)
CO2: 28 mEq/L (ref 19–32)
Calcium: 9.3 mg/dL (ref 8.4–10.5)
Creatinine, Ser: 0.9 mg/dL (ref 0.4–1.2)
GFR: 70.47 mL/min (ref >60.00)
Glucose, Bld: 145 mg/dL — ABNORMAL HIGH (ref 70–99)
POTASSIUM: 3.6 meq/L (ref 3.5–5.1)
SODIUM: 140 meq/L (ref 135–145)

## 2013-09-23 ENCOUNTER — Ambulatory Visit (HOSPITAL_COMMUNITY): Admission: RE | Admit: 2013-09-23 | Payer: No Typology Code available for payment source | Source: Ambulatory Visit

## 2013-09-24 ENCOUNTER — Ambulatory Visit (HOSPITAL_COMMUNITY): Payer: No Typology Code available for payment source

## 2013-09-29 ENCOUNTER — Ambulatory Visit (HOSPITAL_COMMUNITY)
Admission: RE | Admit: 2013-09-29 | Discharge: 2013-09-29 | Disposition: A | Discharge status: Home / Self Care | Payer: No Typology Code available for payment source | Source: Ambulatory Visit | Attending: Cardiovascular Disease | Admitting: Cardiovascular Disease

## 2013-09-29 ENCOUNTER — Other Ambulatory Visit: Payer: Self-pay | Admitting: Cardiovascular Disease

## 2013-09-29 DIAGNOSIS — I7100 Dissection of unspecified site of aorta: Secondary | ICD-10-CM

## 2013-09-29 DIAGNOSIS — R0609 Other forms of dyspnea: Secondary | ICD-10-CM

## 2013-09-29 DIAGNOSIS — R0989 Other specified symptoms and signs involving the circulatory and respiratory systems: Secondary | ICD-10-CM

## 2013-09-29 DIAGNOSIS — R079 Chest pain, unspecified: Secondary | ICD-10-CM

## 2013-09-29 DIAGNOSIS — I2510 Atherosclerotic heart disease of native coronary artery without angina pectoris: Secondary | ICD-10-CM | POA: Insufficient documentation

## 2013-09-29 DIAGNOSIS — E041 Nontoxic single thyroid nodule: Secondary | ICD-10-CM | POA: Insufficient documentation

## 2013-09-29 DIAGNOSIS — I700 Atherosclerosis of aorta: Secondary | ICD-10-CM | POA: Insufficient documentation

## 2013-09-29 MED ORDER — IOHEXOL 300 MG/ML  SOLN
80.0000 mL | Freq: Once | INTRAMUSCULAR | Status: AC | PRN
  Administered 2013-09-29: 80 mL via INTRAVENOUS

## 2013-09-29 MED ORDER — METOPROLOL TARTRATE 1 MG/ML IV SOLN
INTRAVENOUS | Status: AC
  Filled 2013-09-29: qty 5

## 2013-09-29 MED ORDER — NITROGLYCERIN 0.4 MG SL SUBL
SUBLINGUAL_TABLET | SUBLINGUAL | Status: AC
  Filled 2013-09-29: qty 1

## 2013-09-29 MED ORDER — IOHEXOL 350 MG/ML SOLN: 80.0000 mL | Freq: Once | INTRAVENOUS | Status: AC | PRN

## 2013-09-29 NOTE — Progress Notes (Signed)
Pt brought back to radiology nurses station w/ sudden onset of nausea and diaphoresis in waiting area.  Cool cloth placed to head.  Pt laid on stretcher to monitor. VSS.

## 2013-09-30 ENCOUNTER — Encounter: Payer: Self-pay | Admitting: Cardiovascular Disease

## 2013-09-30 ENCOUNTER — Ambulatory Visit (INDEPENDENT_AMBULATORY_CARE_PROVIDER_SITE_OTHER): Payer: No Typology Code available for payment source | Admitting: Cardiovascular Disease

## 2013-09-30 VITALS — BP 142/94 | HR 70 | Ht 61.5 in | Wt 177.0 lb

## 2013-09-30 DIAGNOSIS — R002 Palpitations: Secondary | ICD-10-CM

## 2013-09-30 DIAGNOSIS — R079 Chest pain, unspecified: Secondary | ICD-10-CM

## 2013-09-30 DIAGNOSIS — R0600 Dyspnea, unspecified: Secondary | ICD-10-CM

## 2013-09-30 DIAGNOSIS — I493 Ventricular premature depolarization: Secondary | ICD-10-CM

## 2013-09-30 DIAGNOSIS — R0609 Other forms of dyspnea: Secondary | ICD-10-CM

## 2013-09-30 DIAGNOSIS — I4949 Other premature depolarization: Secondary | ICD-10-CM

## 2013-09-30 DIAGNOSIS — R0989 Other specified symptoms and signs involving the circulatory and respiratory systems: Secondary | ICD-10-CM

## 2013-09-30 MED ORDER — METOPROLOL TARTRATE 50 MG PO TABS: 50.0000 mg | ORAL_TABLET | Freq: Two times a day (BID) | ORAL | Status: DC

## 2013-09-30 NOTE — Patient Instructions (Addendum)
Your physician wants you to follow-up in:  Mid November. You will receive a reminder letter in the mail two months in advance. If you don't receive a letter, please call our office to schedule the follow-up appointment.     Your physician has recommended you make the following change in your medication: Stop Inderal. Start metoprolol 50 mg by mouth twice daily.

## 2013-09-30 NOTE — Progress Notes (Signed)
/    History of Present Illness: 63 yo female with history of HTN, HLD, GERD, OA, depression, ongoing tobacco abuse, palpitations here today for cardiac follow up. I saw her as a new patient 08/12/13 for cardiovascular examination and risk assessment. She told me that she has been on a beta blocker for years. She had been on metoprolol in the past and now is on Propranolol. She tells me that she has been having episodes of weakness and feels that her heart rate is slow. She also described dyspnea on exertion. She has no prior documented CAD. She had a stress test remotely.  She also has exertional chest pain occasionally but not more than once per month.  EKG from 06/27/13 with sinus, PVCs. No LE edema. I arranged an echo on 09/03/13 which showed normal LV size and function, possible bicuspid aortic valve, mildly dilated aortic root with possible dissection flap in aorta. CTA chest with no evidence of aortic dissection, thoracic aorta 3.7 cm. Coronary calcification noted. Event monitor showed sinus rhythm with PVCs, bigeminy.   She is here today for follow up. She continues to have dyspnea with exertion and chest pains. No near syncope or syncope.   Primary Care Physician: Sharlene Motts  Last Lipid Profile:Lipid Panel     Component Value Date/Time   CHOL 256* 06/27/2013 1011   TRIG 168.0* 06/27/2013 1011   HDL 54.10 06/27/2013 1011   CHOLHDL 5 06/27/2013 1011   VLDL 33.6 06/27/2013 1011   LDLCALC 168* 06/27/2013 1011     Past Medical History  Diagnosis Date  . Depression   . GERD (gastroesophageal reflux disease)   . Osteoarthritis   . Allergic rhinitis   . Headache(784.0)   . Hyperlipidemia   . Hypertension   . Insomnia   . Overactive bladder   . Dexamethasone adverse reaction     2002 normal    Past Surgical History  Procedure Laterality Date  . Tonsillectomy    . Carpal tunnel release    . Torn cartilage repaired rt wrist  2003  . Colonoscopy  2005    per Dr. Olevia Perches, polpys, repeat in 5  years    Current Outpatient Prescriptions  Medication Sig Dispense Refill  . ALPRAZolam (XANAX) 1 MG tablet Take 1 tablet (1 mg total) by mouth 2 (two) times daily.  60 tablet  5  . atorvastatin (LIPITOR) 20 MG tablet Take 1 tablet (20 mg total) by mouth daily.  30 tablet  11  . cetirizine (ZYRTEC) 10 MG tablet Take 10 mg by mouth daily.      Marland Kitchen HYDROcodone-acetaminophen (NORCO/VICODIN) 5-325 MG per tablet Take 1 tablet by mouth every 6 (six) hours as needed for moderate pain.  100 tablet  0  . lisinopril-hydrochlorothiazide (PRINZIDE,ZESTORETIC) 10-12.5 MG per tablet Take 1 tablet by mouth daily.  90 tablet  3  . propranolol ER (INDERAL LA) 160 MG SR capsule Take 1 capsule (160 mg total) by mouth daily.  30 capsule  11  . albuterol (PROVENTIL HFA;VENTOLIN HFA) 108 (90 BASE) MCG/ACT inhaler Inhale 2 puffs into the lungs every 6 (six) hours as needed for wheezing.  1 Inhaler  0   No current facility-administered medications for this visit.    Allergies  Allergen Reactions  . Oxycodone-Acetaminophen     REACTION: n/v    History   Social History  . Marital Status: Single    Spouse Name: N/A    Number of Children: 0  . Years of Education: N/A  Occupational History  . CNA    Social History Main Topics  . Smoking status: Current Every Day Smoker -- 1.00 packs/day for 52 years    Types: Cigarettes  . Smokeless tobacco: Never Used     Comment: 1/2 pack per day or less  . Alcohol Use: No  . Drug Use: No  . Sexual Activity: Not on file   Other Topics Concern  . Not on file   Social History Narrative  . No narrative on file    Family History  Problem Relation Age of Onset  . Hyperlipidemia    . Hypertension    . Kidney disease    . CAD Brother   . Emphysema Mother     Review of Systems:  As stated in the HPI and otherwise negative.   BP 142/94  Pulse 70  Ht 5' 1.5" (1.562 m)  Wt 177 lb (80.287 kg)  BMI 32.91 kg/m2  Physical Examination: General: Well developed,  well nourished, NAD HEENT: OP clear, mucus membranes moist SKIN: warm, dry. No rashes. Neuro: No focal deficits Musculoskeletal: Muscle strength 5/5 all ext Psychiatric: Mood and affect normal Neck: No JVD, no carotid bruits, no thyromegaly, no lymphadenopathy. Lungs:Clear bilaterally, no wheezes, rhonci, crackles Cardiovascular: Regular rate and rhythm. No murmurs, gallops or rubs. Abdomen:Soft. Bowel sounds present. Non-tender.  Extremities: No lower extremity edema. Pulses are 2 + in the bilateral DP/PT.  EKG: Sinus, PVCs.   Echo 09/03/13: Left ventricle: The cavity size was normal. Systolic function was normal. The estimated ejection fraction was in the range of 60% to 65%. Wall motion was normal; there were no regional wall motion abnormalities. - Aortic valve: Possibly bicuspid; normal thickness leaflets. - Aorta: Dilated aortic root and ascending aorta measuring 40 mm. There is possible dissection flap in the ascending aorta 48 mm from the aortic annulus. - Mitral valve: Structurally normal valve. There was no regurgitation. - Left atrium: The atrium was normal in size. - Right ventricle: The cavity size was normal. Wall thickness was normal. - Right atrium: The atrium was normal in size. - Tricuspid valve: Structurally normal valve. There was no regurgitation. - Pulmonic valve: Structurally normal valve. - Inferior vena cava: The vessel was normal in size. - Pericardium, extracardiac: There was no pericardial effusion. Impressions: - Dilated aortic root and ascending aorta measuring 40 mm. There is possible dissection flap in the ascending aorta that needs to be further evaluated by either TEE or CTA.  Chest CTA 09/29/13:  1. Prominent ascending aorta measuring 3.7 cm. 2. Thoracic aorta atherosclerosis without dissection. 3. Age advanced coronary artery atherosclerosis. Recommend assessment of coronary risk factors and consideration of medical therapy. 4. Faint  airspace disease especially involving the upper lobes, consistent with alveolitis. Correlation is recommended with pulmonary symptoms. 5. Small right thyroid nodule. No further evaluation is felt to be necessary. This follows ACR consensus guidelines: Managing Incidental Thyroid Nodules Detected on Imaging: White Paper of the ACR Incidental Thyroid Findings Committee. J Am Coll Radiol 2015; 12:143-150. 6. Possible adrenal hyperplasia.    Assessment and Plan:   1. Palpitations/PVCs: She is seen to have multiple PVCs, bigeminy on her event monitor. She feels frequent palpitations. Will stop Inderal and will start metoprolol 50 mg po BID.   2. Dyspnea/chest pain: LV function is normal. No severe valvular disease. She has risk factors for CAD including 50 pack year tobacco abuse, HTN, HLD. I have recommended a cardiac cath but she does not wish to schedule at this  time due to finances. She will call back if her symptoms changes. I will see her back in 3 months to discuss. I also suspect that she has underlying lung disease.

## 2013-10-02 ENCOUNTER — Ambulatory Visit (AMBULATORY_SURGERY_CENTER): Payer: Self-pay | Admitting: *Deleted

## 2013-10-02 VITALS — Ht 60.5 in | Wt 177.0 lb

## 2013-10-02 DIAGNOSIS — Z86010 Personal history of colonic polyps: Secondary | ICD-10-CM

## 2013-10-02 MED ORDER — MOVIPREP 100 G PO SOLR: 1.0000 | Freq: Once | ORAL | Status: DC

## 2013-10-02 NOTE — Progress Notes (Signed)
No egg or soy allergy. No anesthesia problems.  No home O2.  No diet meds.  

## 2013-10-06 ENCOUNTER — Encounter: Payer: Self-pay | Admitting: Internal Medicine

## 2013-10-17 ENCOUNTER — Ambulatory Visit (AMBULATORY_SURGERY_CENTER): Payer: No Typology Code available for payment source | Admitting: Internal Medicine

## 2013-10-17 ENCOUNTER — Encounter: Payer: Self-pay | Admitting: Internal Medicine

## 2013-10-17 VITALS — BP 141/85 | HR 64 | Temp 96.9°F | Resp 26 | Ht 60.0 in | Wt 177.0 lb

## 2013-10-17 DIAGNOSIS — D126 Benign neoplasm of colon, unspecified: Secondary | ICD-10-CM

## 2013-10-17 DIAGNOSIS — Z1211 Encounter for screening for malignant neoplasm of colon: Secondary | ICD-10-CM

## 2013-10-17 DIAGNOSIS — Z86010 Personal history of colonic polyps: Secondary | ICD-10-CM

## 2013-10-17 HISTORY — PX: COLONOSCOPY: SHX174

## 2013-10-17 MED ORDER — SODIUM CHLORIDE 0.9 % IV SOLN: 500.0000 mL | INTRAVENOUS | Status: DC

## 2013-10-17 NOTE — Progress Notes (Signed)
Called to room to assist during endoscopic procedure.  Patient ID and intended procedure confirmed with present staff. Received instructions for my participation in the procedure from the performing physician.  

## 2013-10-17 NOTE — Progress Notes (Signed)
A/ox3 pleased with MAC, report to Suzanne RN 

## 2013-10-17 NOTE — Patient Instructions (Addendum)
YOU HAD AN ENDOSCOPIC PROCEDURE TODAY AT THE [facility]: Refer to the procedure report that was given to you for any specific questions about what was found during the examination.  If the procedure report does not answer your questions, please call your gastroenterologist to clarify.  If you requested that your care partner not be given the details of your procedure findings, then the procedure report has been included in a sealed envelope for you to review at your convenience later.  YOU SHOULD EXPECT: Some feelings of bloating in the abdomen. Passage of more gas than usual.  Walking can help get rid of the air that was put into your GI tract during the procedure and reduce the bloating. If you had a lower endoscopy (such as a colonoscopy or flexible sigmoidoscopy) you may notice spotting of blood in your stool or on the toilet paper. If you underwent a bowel prep for your procedure, then you may not have a normal bowel movement for a few days.  DIET: Your first meal following the procedure should be a light meal and then it is ok to progress to your normal diet.  A half-sandwich or bowl of soup is an example of a good first meal.  Heavy or fried foods are harder to digest and may make you feel nauseous or bloated.  Likewise meals heavy in dairy and vegetables can cause extra gas to form and this can also increase the bloating.  Drink plenty of fluids but you should avoid alcoholic beverages for 24 hours.  ACTIVITY: Your care partner should take you home directly after the procedure.  You should plan to take it easy, moving slowly for the rest of the day.  You can resume normal activity the day after the procedure however you should NOT DRIVE or use heavy machinery for 24 hours (because of the sedation medicines used during the test).    SYMPTOMS TO REPORT IMMEDIATELY: A gastroenterologist can be reached at any hour.  During normal business hours, 8:30 AM to 5:00 PM Monday through Friday,  call [phone].  After hours and on weekends, please call the GI answering service at [phone] who will take a message and have the physician on call contact you.   Following lower endoscopy (colonoscopy or flexible sigmoidoscopy):  Excessive amounts of blood in the stool  Significant tenderness or worsening of abdominal pains  Swelling of the abdomen that is new, acute  Fever of 100F or higher  FOLLOW UP: If any biopsies were taken you will be contacted by phone or by letter within the next 1-3 weeks.  Call your gastroenterologist if you have not heard about the biopsies in 3 weeks.  Our staff will call the home number listed on your records the next business day following your procedure to check on you and address any questions or concerns that you may have at that time regarding the information given to you following your procedure. This is a courtesy call and so if there is no answer at the home number and we have not heard from you through the emergency physician on call, we will assume that you have returned to your regular daily activities without incident.  SIGNATURES/CONFIDENTIALITY: You and/or your care partner have signed paperwork which will be entered into your electronic medical record.  These signatures attest to the fact that that the information above on your After Visit Summary has been reviewed and is understood.  Full responsibility of the confidentiality of this   discharge information lies with you and/or your care-partner.  Dr.Brodie suggested mirilax 1 capful as needed.  If you get diarrhea, cut your mirilax back.   She also suggested more fiber in your diet.  Also milk of magnesia is good for you too.  Read all of your handouts about Diverticulosis and polyps given to you by your recovery room nurse.

## 2013-10-17 NOTE — Op Note (Signed)
[place]  [address] [place], [id number]   COLONOSCOPY PROCEDURE REPORT  PATIENT: [name] [name], [name] [name].  MR#: [id number] BIRTHDATE: 1950/10/07 , 62  yrs. old GENDER: Female ENDOSCOPIST: Lafayette Dragon, MD REFERRED ZE:[id number] Sarajane Jews, M.D. PROCEDURE DATE:  10/17/2013 PROCEDURE:   Colonoscopy with cold biopsy polypectomy First Screening Colonoscopy - Avg.  risk and is 50 yrs.  old or older - No.  Prior Negative Screening - Now for repeat screening. N/A  History of Adenoma - Now for follow-up colonoscopy & has been > or = to 3 yrs.  N/A  Polyps Removed Today? Yes. ASA CLASS:   Class II INDICATIONS:last colonoscopy June 2004 areas hyperplastic polyp removed. MEDICATIONS: MAC sedation, administered by CRNA and Propofol (Diprivan) 180 mg IV  DESCRIPTION OF PROCEDURE:   After the risks benefits and alternatives of the procedure were thoroughly explained, informed consent was obtained.  A digital rectal exam revealed no abnormalities of the rectum.   The [id number]  endoscope was introduced through the anus and advanced to the cecum, which was identified by both the appendix and ileocecal valve. No adverse events experienced.   The quality of the prep was good, using MoviPrep  The instrument was then slowly withdrawn as the colon was fully examined.      COLON FINDINGS: A single polyp ranging between 3-84mm in size was found in the descending colon.  A polypectomy was performed with cold forceps.  The resection was complete and the polyp tissue was completely retrieved.   Mild diverticulosis was noted in the descending colon.  Retroflexed views revealed no abnormalities. The time to cecum=5.  minutes 52 seconds.  Withdrawal time=10 minutes 17 seconds.  The scope was withdrawn and the procedure completed. COMPLICATIONS: There were no complications.  ENDOSCOPIC IMPRESSION: 1.   Single polyp ranging between 3-36mm in size was found in the descending colon;  polypectomy was performed with cold forceps 2.   Mild diverticulosis was noted in the descending colon  RECOMMENDATIONS: 1.  Await pathology results 2.  high-fiber diet recall colonoscopy pending path report   eSigned:  Lafayette Dragon, MD 10/17/2013 11:57 AM   cc:   PATIENT NAME:  [name] [name], [name] [name]. MR#: [id number]

## 2013-10-20 ENCOUNTER — Telehealth: Payer: Self-pay | Admitting: *Deleted

## 2013-10-20 NOTE — Telephone Encounter (Signed)
  Follow up Call-  Call back number 10/17/2013  Post procedure Call Back phone  # [phone]  Permission to leave phone message Yes     Patient questions:  Do you have a fever, pain , or abdominal swelling? No. Pain Score  0 *  Have you tolerated food without any problems? Yes.    Have you been able to return to your normal activities? Yes.    Do you have any questions about your discharge instructions: Diet   No. Medications  No. Follow up visit  No.  Do you have questions or concerns about your Care? Yes.  Questions about constipation  Actions: * If pain score is 4 or above:0 No action needed, pain <4.

## 2013-10-21 ENCOUNTER — Encounter: Payer: Self-pay | Admitting: Internal Medicine

## 2013-10-29 ENCOUNTER — Other Ambulatory Visit: Payer: Self-pay | Admitting: Family Medicine

## 2013-12-09 ENCOUNTER — Telehealth: Payer: Self-pay | Admitting: Family Medicine

## 2013-12-09 NOTE — Telephone Encounter (Signed)
Pt requesting refill of HYDROcodone-acetaminophen (NORCO/VICODIN) 5-325 MG per tablet.  Pt advised PCP is out of office today.

## 2013-12-10 MED ORDER — HYDROCODONE-ACETAMINOPHEN 5-325 MG PO TABS: 1.0000 | ORAL_TABLET | Freq: Four times a day (QID) | ORAL | Status: DC | PRN

## 2013-12-10 NOTE — Telephone Encounter (Signed)
Script is ready for pick up and I spoke with pt.  

## 2013-12-10 NOTE — Telephone Encounter (Signed)
done

## 2014-01-09 ENCOUNTER — Encounter: Payer: Self-pay | Admitting: Internal Medicine

## 2014-02-17 ENCOUNTER — Other Ambulatory Visit: Payer: Self-pay | Admitting: Family Medicine

## 2014-02-17 NOTE — Telephone Encounter (Signed)
Call in #[phone] with 5 rf 

## 2014-03-17 ENCOUNTER — Telehealth: Payer: Self-pay | Admitting: Family Medicine

## 2014-03-17 NOTE — Telephone Encounter (Signed)
Pt request refill of the following: HYDROcodone-acetaminophen (NORCO/VICODIN) 5-325 MG per tablet  Pt said she now  has [facility] and the rx can be filled for 120 pills   Phamacy: pick up

## 2014-03-18 MED ORDER — HYDROCODONE-ACETAMINOPHEN 5-325 MG PO TABS: 1.0000 | ORAL_TABLET | Freq: Four times a day (QID) | ORAL | Status: DC | PRN

## 2014-03-18 NOTE — Telephone Encounter (Signed)
done

## 2014-03-18 NOTE — Telephone Encounter (Signed)
Script is ready for pick up and I spoke with pt.  

## 2014-04-09 ENCOUNTER — Encounter: Payer: Self-pay | Admitting: Cardiovascular Disease

## 2014-04-09 ENCOUNTER — Ambulatory Visit (INDEPENDENT_AMBULATORY_CARE_PROVIDER_SITE_OTHER): Payer: Medicare Other | Admitting: Cardiovascular Disease

## 2014-04-09 ENCOUNTER — Encounter: Payer: Self-pay | Admitting: *Deleted

## 2014-04-09 VITALS — BP 110/84 | HR 72 | Ht 60.0 in | Wt 165.0 lb

## 2014-04-09 DIAGNOSIS — R072 Precordial pain: Secondary | ICD-10-CM

## 2014-04-09 DIAGNOSIS — R002 Palpitations: Secondary | ICD-10-CM | POA: Diagnosis not present

## 2014-04-09 DIAGNOSIS — R0600 Dyspnea, unspecified: Secondary | ICD-10-CM | POA: Diagnosis not present

## 2014-04-09 NOTE — Progress Notes (Signed)
/    History of Present Illness: 64 yo female with history of HTN, HLD, GERD, OA, depression, ongoing tobacco abuse, palpitations here today for cardiac follow up. I saw her as a new patient 08/12/13 for cardiovascular examination and risk assessment. She told me that she has been on a beta blocker for years. She had been on metoprolol in the past and now is on Propranolol. She tells me that she has been having episodes of weakness and feels that her heart rate is slow. She also described dyspnea on exertion. She has no prior documented CAD. She had a stress test remotely.  She also has exertional chest pain occasionally but not more than once per month.  EKG from 06/27/13 with sinus, PVCs. No LE edema. I arranged an echo on 09/03/13 which showed normal LV size and function, possible bicuspid aortic valve, mildly dilated aortic root with possible dissection flap in aorta. CTA chest with no evidence of aortic dissection, thoracic aorta 3.7 cm. Coronary calcification noted. Event monitor showed sinus rhythm with PVCs, bigeminy. Inderal changed to metoprolol.   She is here today for follow up. She continues to have dyspnea with exertion and chest pains. No near syncope or syncope. She is still smoking.   Primary Care Physician: Sharlene Motts  Last Lipid Profile:Lipid Panel     Component Value Date/Time   CHOL 256* 06/27/2013 1011   TRIG 168.0* 06/27/2013 1011   HDL 54.10 06/27/2013 1011   CHOLHDL 5 06/27/2013 1011   VLDL 33.6 06/27/2013 1011   LDLCALC 168* 06/27/2013 1011     Past Medical History  Diagnosis Date  . Depression   . GERD (gastroesophageal reflux disease)   . Osteoarthritis   . Allergic rhinitis   . Headache(784.0)   . Hyperlipidemia   . Hypertension   . Insomnia   . Overactive bladder   . Dexamethasone adverse reaction     2002 normal  . Allergy     seasonal  . Anxiety   . Cataract   . Ulcer     duodenal    Past Surgical History  Procedure Laterality Date  . Tonsillectomy     . Carpal tunnel release    . Torn cartilage repaired rt wrist  2003  . Colonoscopy  2005    per Dr. Olevia Perches, polpys, repeat in 5 years    Current Outpatient Prescriptions  Medication Sig Dispense Refill  . ALPRAZolam (XANAX) 1 MG tablet TAKE 1 TABLET TWICE DAILY (Patient taking differently: TAKE 1 TABLET BY MOUTH TWICE DAILY) 60 tablet 5  . atorvastatin (LIPITOR) 20 MG tablet Take 1 tablet (20 mg total) by mouth daily. 30 tablet 11  . cetirizine (ZYRTEC) 10 MG tablet Take 10 mg by mouth daily.    Marland Kitchen HYDROcodone-acetaminophen (NORCO/VICODIN) 5-325 MG per tablet Take 1 tablet by mouth every 6 (six) hours as needed for moderate pain. 120 tablet 0  . ibuprofen (ADVIL,MOTRIN) 200 MG tablet Take 200 mg by mouth every 6 (six) hours as needed for moderate pain.     Marland Kitchen lisinopril-hydrochlorothiazide (PRINZIDE,ZESTORETIC) 10-12.5 MG per tablet TAKE 1 TABLET BY MOUTH EVERY DAY 90 tablet 3  . metoprolol (LOPRESSOR) 50 MG tablet Take 1 tablet (50 mg total) by mouth 2 (two) times daily. 60 tablet 11  . MOVIPREP 100 G SOLR Take 1 kit (200 g total) by mouth once. Name brand only, movi prep as directed, no substitutions. 1 kit 0  . ranitidine (ZANTAC) 150 MG capsule Take 150 mg by mouth as  needed for heartburn.    . albuterol (PROVENTIL HFA;VENTOLIN HFA) 108 (90 BASE) MCG/ACT inhaler Inhale 2 puffs into the lungs every 6 (six) hours as needed for wheezing. 1 Inhaler 0   No current facility-administered medications for this visit.    Allergies  Allergen Reactions  . Dexamethasone Other (See Comments)    Pt thinks they used it during surgery and she quit breathing, not sure this was the exact medication.  . Oxycodone-Acetaminophen     REACTION: n/v    History   Social History  . Marital Status: Single    Spouse Name: N/A    Number of Children: 0  . Years of Education: N/A   Occupational History  . CNA    Social History Main Topics  . Smoking status: Current Every Day Smoker -- 0.75 packs/day  for 52 years    Types: Cigarettes  . Smokeless tobacco: Never Used     Comment: 1/2 pack per day or less  . Alcohol Use: No  . Drug Use: No  . Sexual Activity: Not on file   Other Topics Concern  . Not on file   Social History Narrative    Family History  Problem Relation Age of Onset  . Hyperlipidemia    . Hypertension    . Kidney disease    . CAD Brother   . Emphysema Mother   . Colon cancer Neg Hx     Review of Systems:  As stated in the HPI and otherwise negative.   BP 110/84 mmHg  Pulse 72  Ht 5' (1.524 m)  Wt 165 lb (74.844 kg)  BMI 32.22 kg/m2  SpO2 93%  Physical Examination: General: Well developed, well nourished, NAD HEENT: OP clear, mucus membranes moist SKIN: warm, dry. No rashes. Neuro: No focal deficits Musculoskeletal: Muscle strength 5/5 all ext Psychiatric: Mood and affect normal Neck: No JVD, no carotid bruits, no thyromegaly, no lymphadenopathy. Lungs:Clear bilaterally, no wheezes, rhonci, crackles Cardiovascular: Regular rate and rhythm. No murmurs, gallops or rubs. Abdomen:Soft. Bowel sounds present. Non-tender.  Extremities: No lower extremity edema. Pulses are 2 + in the bilateral DP/PT.  EKG: Sinus, PVCs.   Echo 09/03/13: Left ventricle: The cavity size was normal. Systolic function was normal. The estimated ejection fraction was in the range of 60% to 65%. Wall motion was normal; there were no regional wall motion abnormalities. - Aortic valve: Possibly bicuspid; normal thickness leaflets. - Aorta: Dilated aortic root and ascending aorta measuring 40 mm. There is possible dissection flap in the ascending aorta 48 mm from the aortic annulus. - Mitral valve: Structurally normal valve. There was no regurgitation. - Left atrium: The atrium was normal in size. - Right ventricle: The cavity size was normal. Wall thickness was normal. - Right atrium: The atrium was normal in size. - Tricuspid valve: Structurally normal valve. There was  no regurgitation. - Pulmonic valve: Structurally normal valve. - Inferior vena cava: The vessel was normal in size. - Pericardium, extracardiac: There was no pericardial effusion. Impressions: - Dilated aortic root and ascending aorta measuring 40 mm. There is possible dissection flap in the ascending aorta that needs to be further evaluated by either TEE or CTA.  Chest CTA 09/29/13:  1. Prominent ascending aorta measuring 3.7 cm. 2. Thoracic aorta atherosclerosis without dissection. 3. Age advanced coronary artery atherosclerosis. Recommend assessment of coronary risk factors and consideration of medical therapy. 4. Faint airspace disease especially involving the upper lobes, consistent with alveolitis. Correlation is recommended with pulmonary symptoms.   5. Small right thyroid nodule. No further evaluation is felt to be necessary. This follows ACR consensus guidelines: Managing Incidental Thyroid Nodules Detected on Imaging: White Paper of the ACR Incidental Thyroid Findings Committee. J Am Coll Radiol 2015; 12:143-150. 6. Possible adrenal hyperplasia.  EKG: NSR, rate 72 bpm.   Assessment and Plan:   1. Palpitations/PVCs: She is seen to have multiple PVCs, bigeminy on her event monitor. Improved on metoprolol.    2. Dyspnea/chest pain: LV function is normal. No severe valvular disease. She has risk factors for CAD including 50 pack year tobacco abuse, HTN, HLD. I have recommended a cardiac cath. She now wishes to proceed. Will arrange on 04/22/14 at [facility]. Risks and benefits reviewed with pt.

## 2014-04-09 NOTE — Patient Instructions (Addendum)
Your physician recommends that you schedule a follow-up appointment in:  4-6 weeks after procedure.  Scheduled for June 04, 2014 at 10:30  Your physician has requested that you have a cardiac catheterization. Cardiac catheterization is used to diagnose and/or treat various heart conditions. Doctors may recommend this procedure for a number of different reasons. The most common reason is to evaluate chest pain. Chest pain can be a symptom of coronary artery disease (CAD), and cardiac catheterization can show whether plaque is narrowing or blocking your heart's arteries. This procedure is also used to evaluate the valves, as well as measure the blood flow and oxygen levels in different parts of your heart. For further information please visit [email or web address]. Please follow instruction sheet, as given. Scheduled for April 22, 2014   Coronary Angiogram A coronary angiogram, also called coronary angiography, is an X-ray procedure used to look at the arteries in the heart. In this procedure, a dye (contrast dye) is injected through a long, hollow tube (catheter). The catheter is about the size of a piece of cooked spaghetti and is inserted through your groin, wrist, or arm. The dye is injected into each artery, and X-rays are then taken to show if there is a blockage in the arteries of your heart. LET [facility] CARE PROVIDER KNOW ABOUT:  Any allergies you have, including allergies to shellfish or contrast dye.   All medicines you are taking, including vitamins, herbs, eye drops, creams, and over-the-counter medicines.   Previous problems you or members of your family have had with the use of anesthetics.   Any blood disorders you have.   Previous surgeries you have had.  History of kidney problems or failure.   Other medical conditions you have. RISKS AND COMPLICATIONS  Generally, a coronary angiogram is a safe procedure. However, problems can occur and include:  Allergic reaction to  the dye.  Bleeding from the access site or other locations.  Kidney injury, especially in people with impaired kidney function.  Stroke (rare).  Heart attack (rare). BEFORE THE PROCEDURE   Do not eat or drink anything after midnight the night before the procedure or as directed by your health care provider.   Ask your health care provider about changing or stopping your regular medicines. This is especially important if you are taking diabetes medicines or blood thinners. PROCEDURE  You may be given a medicine to help you relax (sedative) before the procedure. This medicine is given through an intravenous (IV) access tube that is inserted into one of your veins.   The area where the catheter will be inserted will be washed and shaved. This is usually done in the groin but may be done in the fold of your arm (near your elbow) or in the wrist.   A medicine will be given to numb the area where the catheter will be inserted (local anesthetic).   The health care provider will insert the catheter into an artery. The catheter will be guided by using a special type of X-ray (fluoroscopy) of the blood vessel being examined.   A special dye will then be injected into the catheter, and X-rays will be taken. The dye will help to show where any narrowing or blockages are located in the heart arteries.  AFTER THE PROCEDURE   If the procedure is done through the leg, you will be kept in bed lying flat for several hours. You will be instructed to not bend or cross your legs.  The insertion site will  be checked frequently.   The pulse in your feet or wrist will be checked frequently.   Additional blood tests, X-rays, and an electrocardiogram may be done.  Document Released: 09/24/2002 Document Revised: 08/04/2013 Document Reviewed: 08/12/2012 [facility] Patient Information 2015 [place], [place]. This information is not intended to replace advice given to you by your health care provider.  Make sure you discuss any questions you have with your health care provider.

## 2014-04-20 ENCOUNTER — Other Ambulatory Visit (INDEPENDENT_AMBULATORY_CARE_PROVIDER_SITE_OTHER): Payer: Medicare Other | Admitting: *Deleted

## 2014-04-20 DIAGNOSIS — R072 Precordial pain: Secondary | ICD-10-CM | POA: Diagnosis not present

## 2014-04-20 DIAGNOSIS — R0600 Dyspnea, unspecified: Secondary | ICD-10-CM

## 2014-04-20 LAB — BASIC METABOLIC PANEL
BUN: 17 mg/dL (ref 6–23)
CO2: 26 mEq/L (ref 19–32)
Calcium: 9.4 mg/dL (ref 8.4–10.5)
Chloride: 101 mEq/L (ref 96–112)
Creatinine, Ser: 0.88 mg/dL (ref 0.40–1.20)
GFR: 68.5 mL/min (ref >60.00)
GLUCOSE: 92 mg/dL (ref 70–99)
Potassium: 4.3 mEq/L (ref 3.5–5.1)
Sodium: 135 mEq/L (ref 135–145)

## 2014-04-20 LAB — CBC WITH DIFFERENTIAL/PLATELET
Basophils Absolute: 0 10*3/uL (ref 0.0–0.1)
Basophils Relative: 0.5 % (ref 0.0–3.0)
Eosinophils Absolute: 0.2 10*3/uL (ref 0.0–0.7)
Eosinophils Relative: 2.4 % (ref 0.0–5.0)
HCT: 45.3 % (ref 36.0–46.0)
Hemoglobin: 14.9 g/dL (ref 12.0–15.0)
Lymphocytes Relative: 59.4 % — ABNORMAL HIGH (ref 12.0–46.0)
Lymphs Abs: 5.2 10*3/uL — ABNORMAL HIGH (ref 0.7–4.0)
MCHC: 32.9 g/dL (ref 30.0–36.0)
MCV: 93.2 fl (ref 78.0–100.0)
Monocytes Absolute: 0.6 10*3/uL (ref 0.1–1.0)
Monocytes Relative: 6.8 % (ref 3.0–12.0)
Neutro Abs: 2.7 10*3/uL (ref 1.4–7.7)
Neutrophils Relative %: 30.9 % — ABNORMAL LOW (ref 43.0–77.0)
PLATELETS: 307 10*3/uL (ref 150.0–400.0)
RBC: 4.85 Mil/uL (ref 3.87–5.11)
RDW: 14.2 % (ref 11.5–15.5)
WBC: 8.7 10*3/uL (ref 4.0–10.5)

## 2014-04-20 LAB — PROTIME-INR
INR: 1 ratio (ref 0.8–1.0)
Prothrombin Time: 10.8 s (ref 9.6–13.1)

## 2014-04-22 ENCOUNTER — Encounter (HOSPITAL_COMMUNITY)
Admission: RE | Disposition: A | Discharge status: Home / Self Care | Payer: Self-pay | Source: Ambulatory Visit | Attending: Cardiovascular Disease

## 2014-04-22 ENCOUNTER — Encounter (HOSPITAL_COMMUNITY): Payer: Self-pay | Admitting: Cardiovascular Disease

## 2014-04-22 ENCOUNTER — Ambulatory Visit (HOSPITAL_COMMUNITY)
Admission: RE | Admit: 2014-04-22 | Discharge: 2014-04-22 | Disposition: A | Discharge status: Home / Self Care | Payer: Medicare Other | Source: Ambulatory Visit | Attending: Cardiovascular Disease | Admitting: Cardiovascular Disease

## 2014-04-22 DIAGNOSIS — E785 Hyperlipidemia, unspecified: Secondary | ICD-10-CM | POA: Insufficient documentation

## 2014-04-22 DIAGNOSIS — F17210 Nicotine dependence, cigarettes, uncomplicated: Secondary | ICD-10-CM | POA: Insufficient documentation

## 2014-04-22 DIAGNOSIS — I10 Essential (primary) hypertension: Secondary | ICD-10-CM | POA: Diagnosis not present

## 2014-04-22 DIAGNOSIS — F329 Major depressive disorder, single episode, unspecified: Secondary | ICD-10-CM | POA: Diagnosis not present

## 2014-04-22 DIAGNOSIS — Z8249 Family history of ischemic heart disease and other diseases of the circulatory system: Secondary | ICD-10-CM | POA: Insufficient documentation

## 2014-04-22 DIAGNOSIS — R079 Chest pain, unspecified: Secondary | ICD-10-CM | POA: Diagnosis present

## 2014-04-22 DIAGNOSIS — Z791 Long term (current) use of non-steroidal anti-inflammatories (NSAID): Secondary | ICD-10-CM | POA: Diagnosis not present

## 2014-04-22 DIAGNOSIS — G4700 Insomnia, unspecified: Secondary | ICD-10-CM | POA: Diagnosis not present

## 2014-04-22 DIAGNOSIS — R072 Precordial pain: Secondary | ICD-10-CM

## 2014-04-22 DIAGNOSIS — M1990 Unspecified osteoarthritis, unspecified site: Secondary | ICD-10-CM | POA: Diagnosis not present

## 2014-04-22 DIAGNOSIS — K219 Gastro-esophageal reflux disease without esophagitis: Secondary | ICD-10-CM | POA: Insufficient documentation

## 2014-04-22 DIAGNOSIS — Z79899 Other long term (current) drug therapy: Secondary | ICD-10-CM | POA: Insufficient documentation

## 2014-04-22 DIAGNOSIS — I2510 Atherosclerotic heart disease of native coronary artery without angina pectoris: Secondary | ICD-10-CM | POA: Diagnosis not present

## 2014-04-22 HISTORY — PX: LEFT AND RIGHT HEART CATHETERIZATION WITH CORONARY ANGIOGRAM: SHX5449

## 2014-04-22 LAB — POCT I-STAT 3, ART BLOOD GAS (G3+)
ACID-BASE DEFICIT: 2 mmol/L (ref 0.0–2.0)
Bicarbonate: 23.6 mEq/L (ref 20.0–24.0)
O2 Saturation: 99 %
TCO2: 25 mmol/L (ref 0–100)
pCO2 arterial: 42.5 mmHg (ref 35.0–45.0)
pH, Arterial: 7.352 (ref 7.350–7.450)
pO2, Arterial: 122 mmHg — ABNORMAL HIGH (ref 80.0–100.0)

## 2014-04-22 LAB — POCT I-STAT 3, VENOUS BLOOD GAS (G3P V)
ACID-BASE DEFICIT: 2 mmol/L (ref 0.0–2.0)
Bicarbonate: 24.4 mEq/L — ABNORMAL HIGH (ref 20.0–24.0)
O2 Saturation: 69 %
PO2 VEN: 39 mmHg (ref 30.0–45.0)
TCO2: 26 mmol/L (ref 0–100)
pCO2, Ven: 47.2 mmHg (ref 45.0–50.0)
pH, Ven: 7.322 — ABNORMAL HIGH (ref 7.250–7.300)

## 2014-04-22 LAB — POCT ACTIVATED CLOTTING TIME: ACTIVATED CLOTTING TIME: 313 s

## 2014-04-22 SURGERY — LEFT AND RIGHT HEART CATHETERIZATION WITH CORONARY ANGIOGRAM
Anesthesia: LOCAL

## 2014-04-22 MED ORDER — FENTANYL CITRATE 0.05 MG/ML IJ SOLN
INTRAMUSCULAR | Status: AC
  Filled 2014-04-22: qty 2

## 2014-04-22 MED ORDER — SODIUM CHLORIDE 0.9 % IJ SOLN
3.0000 mL | Freq: Two times a day (BID) | INTRAMUSCULAR | Status: DC
Start: 2014-04-22 — End: 2014-04-22

## 2014-04-22 MED ORDER — HEPARIN SODIUM (PORCINE) 1000 UNIT/ML IJ SOLN
INTRAMUSCULAR | Status: AC
  Filled 2014-04-22: qty 1

## 2014-04-22 MED ORDER — LIDOCAINE HCL (PF) 1 % IJ SOLN
INTRAMUSCULAR | Status: AC
  Filled 2014-04-22: qty 30

## 2014-04-22 MED ORDER — SODIUM CHLORIDE 0.9 % IV SOLN
INTRAVENOUS | Status: DC
  Administered 2014-04-22: 08:00:00 via INTRAVENOUS

## 2014-04-22 MED ORDER — VERAPAMIL HCL 2.5 MG/ML IV SOLN
INTRAVENOUS | Status: AC
  Filled 2014-04-22: qty 2

## 2014-04-22 MED ORDER — MIDAZOLAM HCL 2 MG/2ML IJ SOLN
INTRAMUSCULAR | Status: AC
  Filled 2014-04-22: qty 2

## 2014-04-22 MED ORDER — SODIUM CHLORIDE 0.9 % IV SOLN: 250.0000 mL | INTRAVENOUS | Status: DC | PRN

## 2014-04-22 MED ORDER — HEPARIN (PORCINE) IN NACL 2-0.9 UNIT/ML-% IJ SOLN
INTRAMUSCULAR | Status: AC
  Filled 2014-04-22: qty 1000

## 2014-04-22 MED ORDER — HEPARIN (PORCINE) IN NACL 2-0.9 UNIT/ML-% IJ SOLN
INTRAMUSCULAR | Status: AC
  Filled 2014-04-22: qty 500

## 2014-04-22 MED ORDER — ASPIRIN 81 MG PO CHEW
CHEWABLE_TABLET | ORAL | Status: AC
  Administered 2014-04-22: 81 mg via ORAL
  Filled 2014-04-22: qty 1

## 2014-04-22 MED ORDER — ADENOSINE 12 MG/4ML IV SOLN
12.0000 mL | Freq: Once | INTRAVENOUS | Status: DC
  Filled 2014-04-22: qty 12

## 2014-04-22 MED ORDER — SODIUM CHLORIDE 0.9 % IJ SOLN: 3.0000 mL | INTRAMUSCULAR | Status: DC | PRN

## 2014-04-22 MED ORDER — NITROGLYCERIN 1 MG/10 ML FOR IR/CATH LAB
INTRA_ARTERIAL | Status: AC
  Filled 2014-04-22: qty 10

## 2014-04-22 MED ORDER — ASPIRIN 81 MG PO CHEW
81.0000 mg | CHEWABLE_TABLET | ORAL | Status: AC
  Administered 2014-04-22: 81 mg via ORAL

## 2014-04-22 NOTE — Progress Notes (Signed)
Pt states her heart feels like it is "pounding funny".  Pts HR is 44 on the monitor and 48 when counted for 1 full min. Pts HR has been 70-80.  Occasionally dropping down in the 40s.  Pt states this is when she feels the pounding.  Dr. Angelena Form notified and came to assess and speak with pt. Changed pts dose of lopressor to half.  Pt verbalizes understanding. Instructions repeated verbally and in writing.

## 2014-04-22 NOTE — H&P (View-Only) (Signed)
/    History of Present Illness: 64 yo female with history of HTN, HLD, GERD, OA, depression, ongoing tobacco abuse, palpitations here today for cardiac follow up. I saw her as a new patient 08/12/13 for cardiovascular examination and risk assessment. She told me that she has been on a beta blocker for years. She had been on metoprolol in the past and now is on Propranolol. She tells me that she has been having episodes of weakness and feels that her heart rate is slow. She also described dyspnea on exertion. She has no prior documented CAD. She had a stress test remotely.  She also has exertional chest pain occasionally but not more than once per month.  EKG from 06/27/13 with sinus, PVCs. No LE edema. I arranged an echo on 09/03/13 which showed normal LV size and function, possible bicuspid aortic valve, mildly dilated aortic root with possible dissection flap in aorta. CTA chest with no evidence of aortic dissection, thoracic aorta 3.7 cm. Coronary calcification noted. Event monitor showed sinus rhythm with PVCs, bigeminy. Inderal changed to metoprolol.   She is here today for follow up. She continues to have dyspnea with exertion and chest pains. No near syncope or syncope. She is still smoking.   Primary Care Physician: Sharlene Motts  Last Lipid Profile:Lipid Panel     Component Value Date/Time   CHOL 256* 06/27/2013 1011   TRIG 168.0* 06/27/2013 1011   HDL 54.10 06/27/2013 1011   CHOLHDL 5 06/27/2013 1011   VLDL 33.6 06/27/2013 1011   LDLCALC 168* 06/27/2013 1011     Past Medical History  Diagnosis Date  . Depression   . GERD (gastroesophageal reflux disease)   . Osteoarthritis   . Allergic rhinitis   . Headache(784.0)   . Hyperlipidemia   . Hypertension   . Insomnia   . Overactive bladder   . Dexamethasone adverse reaction     2002 normal  . Allergy     seasonal  . Anxiety   . Cataract   . Ulcer     duodenal    Past Surgical History  Procedure Laterality Date  . Tonsillectomy     . Carpal tunnel release    . Torn cartilage repaired rt wrist  2003  . Colonoscopy  2005    per Dr. Olevia Perches, polpys, repeat in 5 years    Current Outpatient Prescriptions  Medication Sig Dispense Refill  . ALPRAZolam (XANAX) 1 MG tablet TAKE 1 TABLET TWICE DAILY (Patient taking differently: TAKE 1 TABLET BY MOUTH TWICE DAILY) 60 tablet 5  . atorvastatin (LIPITOR) 20 MG tablet Take 1 tablet (20 mg total) by mouth daily. 30 tablet 11  . cetirizine (ZYRTEC) 10 MG tablet Take 10 mg by mouth daily.    Marland Kitchen HYDROcodone-acetaminophen (NORCO/VICODIN) 5-325 MG per tablet Take 1 tablet by mouth every 6 (six) hours as needed for moderate pain. 120 tablet 0  . ibuprofen (ADVIL,MOTRIN) 200 MG tablet Take 200 mg by mouth every 6 (six) hours as needed for moderate pain.     Marland Kitchen lisinopril-hydrochlorothiazide (PRINZIDE,ZESTORETIC) 10-12.5 MG per tablet TAKE 1 TABLET BY MOUTH EVERY DAY 90 tablet 3  . metoprolol (LOPRESSOR) 50 MG tablet Take 1 tablet (50 mg total) by mouth 2 (two) times daily. 60 tablet 11  . MOVIPREP 100 G SOLR Take 1 kit (200 g total) by mouth once. Name brand only, movi prep as directed, no substitutions. 1 kit 0  . ranitidine (ZANTAC) 150 MG capsule Take 150 mg by mouth as  needed for heartburn.    Marland Kitchen albuterol (PROVENTIL HFA;VENTOLIN HFA) 108 (90 BASE) MCG/ACT inhaler Inhale 2 puffs into the lungs every 6 (six) hours as needed for wheezing. 1 Inhaler 0   No current facility-administered medications for this visit.    Allergies  Allergen Reactions  . Dexamethasone Other (See Comments)    Pt thinks they used it during surgery and she quit breathing, not sure this was the exact medication.  . Oxycodone-Acetaminophen     REACTION: n/v    History   Social History  . Marital Status: Single    Spouse Name: N/A    Number of Children: 0  . Years of Education: N/A   Occupational History  . CNA    Social History Main Topics  . Smoking status: Current Every Day Smoker -- 0.75 packs/day  for 52 years    Types: Cigarettes  . Smokeless tobacco: Never Used     Comment: 1/2 pack per day or less  . Alcohol Use: No  . Drug Use: No  . Sexual Activity: Not on file   Other Topics Concern  . Not on file   Social History Narrative    Family History  Problem Relation Age of Onset  . Hyperlipidemia    . Hypertension    . Kidney disease    . CAD Brother   . Emphysema Mother   . Colon cancer Neg Hx     Review of Systems:  As stated in the HPI and otherwise negative.   BP 110/84 mmHg  Pulse 72  Ht 5' (1.524 m)  Wt 165 lb (74.844 kg)  BMI 32.22 kg/m2  SpO2 93%  Physical Examination: General: Well developed, well nourished, NAD HEENT: OP clear, mucus membranes moist SKIN: warm, dry. No rashes. Neuro: No focal deficits Musculoskeletal: Muscle strength 5/5 all ext Psychiatric: Mood and affect normal Neck: No JVD, no carotid bruits, no thyromegaly, no lymphadenopathy. Lungs:Clear bilaterally, no wheezes, rhonci, crackles Cardiovascular: Regular rate and rhythm. No murmurs, gallops or rubs. Abdomen:Soft. Bowel sounds present. Non-tender.  Extremities: No lower extremity edema. Pulses are 2 + in the bilateral DP/PT.  EKG: Sinus, PVCs.   Echo 09/03/13: Left ventricle: The cavity size was normal. Systolic function was normal. The estimated ejection fraction was in the range of 60% to 65%. Wall motion was normal; there were no regional wall motion abnormalities. - Aortic valve: Possibly bicuspid; normal thickness leaflets. - Aorta: Dilated aortic root and ascending aorta measuring 40 mm. There is possible dissection flap in the ascending aorta 48 mm from the aortic annulus. - Mitral valve: Structurally normal valve. There was no regurgitation. - Left atrium: The atrium was normal in size. - Right ventricle: The cavity size was normal. Wall thickness was normal. - Right atrium: The atrium was normal in size. - Tricuspid valve: Structurally normal valve. There was  no regurgitation. - Pulmonic valve: Structurally normal valve. - Inferior vena cava: The vessel was normal in size. - Pericardium, extracardiac: There was no pericardial effusion. Impressions: - Dilated aortic root and ascending aorta measuring 40 mm. There is possible dissection flap in the ascending aorta that needs to be further evaluated by either TEE or CTA.  Chest CTA 09/29/13:  1. Prominent ascending aorta measuring 3.7 cm. 2. Thoracic aorta atherosclerosis without dissection. 3. Age advanced coronary artery atherosclerosis. Recommend assessment of coronary risk factors and consideration of medical therapy. 4. Faint airspace disease especially involving the upper lobes, consistent with alveolitis. Correlation is recommended with pulmonary symptoms.  5. Small right thyroid nodule. No further evaluation is felt to be necessary. This follows ACR consensus guidelines: Managing Incidental Thyroid Nodules Detected on Imaging: White Paper of the ACR Incidental Thyroid Findings Committee. J Am Coll Radiol 2015; 12:143-150. 6. Possible adrenal hyperplasia.  EKG: NSR, rate 72 bpm.   Assessment and Plan:   1. Palpitations/PVCs: She is seen to have multiple PVCs, bigeminy on her event monitor. Improved on metoprolol.    2. Dyspnea/chest pain: LV function is normal. No severe valvular disease. She has risk factors for CAD including 50 pack year tobacco abuse, HTN, HLD. I have recommended a cardiac cath. She now wishes to proceed. Will arrange on 04/22/14 at [facility]. Risks and benefits reviewed with pt.

## 2014-04-22 NOTE — Discharge Instructions (Signed)
Cut  Lopressor dose in half and take 25mg   twice daily.    Radial Site Care Refer to this sheet in the next few weeks. These instructions provide you with information on caring for yourself after your procedure. Your caregiver may also give you more specific instructions. Your treatment has been planned according to current medical practices, but problems sometimes occur. Call your caregiver if you have any problems or questions after your procedure. HOME CARE INSTRUCTIONS  You may shower the day after the procedure.Remove the bandage (dressing) and gently wash the site with plain soap and water.Gently pat the site dry.  Do not apply powder or lotion to the site.  Do not submerge the affected site in water for 3 to 5 days.  Inspect the site at least twice daily.  Do not flex or bend the affected arm for 24 hours.  No lifting over 5 pounds (2.3 kg) for 5 days after your procedure.  Do not drive home if you are discharged the same day of the procedure. Have someone else drive you.  You may drive 24 hours after the procedure unless otherwise instructed by your caregiver.  Do not operate machinery or power tools for 24 hours.  A responsible adult should be with you for the first 24 hours after you arrive home. What to expect:  Any bruising will usually fade within 1 to 2 weeks.  Blood that collects in the tissue (hematoma) may be painful to the touch. It should usually decrease in size and tenderness within 1 to 2 weeks. SEEK IMMEDIATE MEDICAL CARE IF:  You have unusual pain at the radial site.  You have redness, warmth, swelling, or pain at the radial site.  You have drainage (other than a small amount of blood on the dressing).  You have chills.  You have a fever or persistent symptoms for more than 72 hours.  You have a fever and your symptoms suddenly get worse.  Your arm becomes pale, cool, tingly, or numb.  You have heavy bleeding from the site. Hold pressure on the  site. Document Released: 04/22/2010 Document Revised: 06/12/2011 Document Reviewed: 04/22/2010 [facility] Patient Information 2015 [place], [place]. This information is not intended to replace advice given to you by your health care provider. Make sure you discuss any questions you have with your health care provider.

## 2014-04-22 NOTE — Interval H&P Note (Signed)
History and Physical Interval Note:  04/22/2014 8:24 AM  Arie Sabina  has presented today for cardiac cath with the diagnosis of cp, sob.  The various methods of treatment have been discussed with the patient and family. After consideration of risks, benefits and other options for treatment, the patient has consented to  Procedure(s): LEFT AND RIGHT HEART CATHETERIZATION WITH CORONARY ANGIOGRAM (N/A) as a surgical intervention .  The patient's history has been reviewed, patient examined, no change in status, stable for surgery.  I have reviewed the patient's chart and labs.  Questions were answered to the patient's satisfaction.    Cath Lab Visit (complete for each Cath Lab visit)  Clinical Evaluation Leading to the Procedure:   ACS: No.  Non-ACS:    Anginal Classification: CCS III  Anti-ischemic medical therapy: Minimal Therapy (1 class of medications)  Non-Invasive Test Results: No non-invasive testing performed  Prior CABG: No previous CABG         Hau Sanor

## 2014-04-22 NOTE — Progress Notes (Signed)
Site area: right brachial  Site Prior to Removal:  Level 0  Pressure Applied For 20 MINUTES    Minutes Beginning at 1030  Manual:   Yes.    Patient Status During Pull: stable  Post Pull brachial Site:  Level 0  Post Pull Instructions Given:  Yes.    Post Pull Pulses Present:  Yes.    Dressing Applied:  Yes.    Comments:  Pt tolerated well

## 2014-04-22 NOTE — CV Procedure (Signed)
Cardiac Catheterization Operative Report  [name] [name] [id number] 1/20/20169:43 AM [name], MD  Procedure Performed:  1. Left Heart Catheterization 2. Selective Coronary Angiography 3. Right Heart Catheterization 4. Left ventricular angiogram 5. Fractional flow reserve of the LAD  Operator: Lauree Chandler, MD  Access: Right radial artery and right antecubital vein  Indication:  64 yo female  with history of HTN, HLD, GERD, OA, depression, ongoing tobacco abuse, palpitations here today for cardiac follow up. I saw her as a new patient 08/12/13 for cardiovascular examination and risk assessment. She told me that she has been on a beta blocker for years. She had been on metoprolol in the past and now is on Propranolol. She tells me that she has been having episodes of weakness and feels that her heart rate is slow. She also described dyspnea on exertion. She has no prior documented CAD. She had a stress test remotely. She also has exertional chest pain occasionally but not more than once per month. EKG from 06/27/13 with sinus, PVCs. No LE edema. I arranged an echo on 09/03/13 which showed normal LV size and function, possible bicuspid aortic valve, mildly dilated aortic root with possible dissection flap in aorta. CTA chest with no evidence of aortic dissection, thoracic aorta 3.7 cm. Coronary calcification noted. Event monitor showed sinus rhythm with PVCs, bigeminy. Inderal changed to metoprolol.  Still having palpitations and associated chest pressure/dyspnea so cardiac cath arranged.                          Procedure Details: The risks, benefits, complications, treatment options, and expected outcomes were discussed with the patient. The patient and/or family concurred with the proposed plan, giving informed consent. The patient was brought to the cath lab after IV hydration was begun and oral premedication was given. The patient was further sedated with Versed and  Fentanyl. The right antecubital IV was prepped and draped. I then exchanged the IV in the antecubital vein for a 5 Fr sheath. Right heart cath performed via the right antecubital vein. I then prepped the right wrist. A 5 Fr sheath was placed in the right radial artery. 3500 units IV heparin given. 3 mg Verapamil given through the sheath. Standard diagnostic catheters were used to perform selective coronary angiography. A pigtail catheter was used to perform a left ventricular angiogram. She was found to have a moderate stenosis in the mid LAD. I elected to perform FFR of the LAD.   PCI Note: Left main engaged with a XB LAD 3.5 guiding catheter. Additional heparin 5000 units given x 1. ACT over 300. Volcano flow wire down LAD. FFR baseline 0.90. After 3 minutes of adenosine infusion, FFR was 0.82-0.85.   There were no immediate complications. The patient was taken to the recovery area in stable condition.   Hemodynamic Findings: Ao:   134/74             LV: 132/8/18 RA: 1           RV: 38/4/8 PA:  33/12 (mean 21) PCWP:  4 Fick Cardiac Output: 3.8 L/min Fick Cardiac Index: 2.19 L/min/m2 Central Aortic Saturation: 99% Pulmonary Artery Saturation: 69%  Angiographic Findings:  Left main: No obstructive disease  Left Anterior Descending Artery: Large caliber vessel that courses to the apex. The proximal vessel has diffuse 20% stenosis. The mid vessel has a 50-60% stenosis. There is a small septal branch that arises from this plaque  with ostial 90% stenosis. Several small caliber diagonal branches.   Circumflex Artery:  Large caliber vessel with large caliber first obtuse marginal branch. No obstructive disease.   Right Coronary Artery: Large dominant vessel with proximal and mid calcification, diffuse 20% stenosis in the proximal and mid vessel.   Left Ventricular Angiogram: LVEF=55-60%.   Impression: 1. Moderate non-obstructive CAD 2. Moderate mid LAD stenosis with FFR borderline at 0.82.    3. Normal LV systolic function  Recommendations: Will attempt medical management of mid LAD stenosis per guidelines (FFR is not flow limiting). Will increase beta blocker. Continue statin.        Complications:  None; patient tolerated the procedure well.

## 2014-04-23 ENCOUNTER — Telehealth: Payer: Self-pay | Admitting: Cardiovascular Disease

## 2014-04-23 NOTE — Telephone Encounter (Signed)
New message     Pt had a cath yesterday.  Today her fingers are tingling and a little numb.  They are also a little swollen.  This is the same arm where they did the cath.  Please advise

## 2014-04-23 NOTE — Telephone Encounter (Signed)
Pt c/o of tingling, cool, puffy fingers after right radial cath on 1/20.  Pt states that there is no pain or having bleeding from cath site, band-aide still applied to site. Pt states there is not radiation up the arm or difficulty completing ADL's.  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  Pt instructed it is okay to write, she is just to restrain from strenuous activity and heavy lifting on this side for a few days.  The symptoms she is experiencing are expected and to continue to monitor and let us know if it gets worse or new symptoms start.  Pt instructed to call our office anytime if she has any other questions or concerns. Pt expressed understanding agreed with plan.

## 2014-05-25 ENCOUNTER — Ambulatory Visit (INDEPENDENT_AMBULATORY_CARE_PROVIDER_SITE_OTHER): Payer: Medicare Other | Admitting: Family Medicine

## 2014-05-25 ENCOUNTER — Encounter: Payer: Self-pay | Admitting: Family Medicine

## 2014-05-25 VITALS — BP 131/95 | HR 70 | Temp 98.5°F | Ht 61.0 in | Wt 162.0 lb

## 2014-05-25 DIAGNOSIS — R002 Palpitations: Secondary | ICD-10-CM | POA: Diagnosis not present

## 2014-05-25 DIAGNOSIS — I10 Essential (primary) hypertension: Secondary | ICD-10-CM | POA: Diagnosis not present

## 2014-05-25 DIAGNOSIS — J209 Acute bronchitis, unspecified: Secondary | ICD-10-CM | POA: Diagnosis not present

## 2014-05-25 DIAGNOSIS — F411 Generalized anxiety disorder: Secondary | ICD-10-CM

## 2014-05-25 HISTORY — DX: Generalized anxiety disorder: F41.1

## 2014-05-25 MED ORDER — AZITHROMYCIN 250 MG PO TABS: ORAL_TABLET | ORAL | Status: DC

## 2014-05-25 MED ORDER — HYDROCODONE-ACETAMINOPHEN 5-325 MG PO TABS: 1.0000 | ORAL_TABLET | Freq: Four times a day (QID) | ORAL | Status: DC | PRN

## 2014-05-25 MED ORDER — ALPRAZOLAM 0.25 MG PO TABS: 0.7500 mg | ORAL_TABLET | Freq: Four times a day (QID) | ORAL | Status: DC | PRN

## 2014-05-25 NOTE — Progress Notes (Signed)
Pre visit review using our clinic review tool, if applicable. No additional management support is needed unless otherwise documented below in the visit note. 

## 2014-05-25 NOTE — Progress Notes (Signed)
   Subjective:    Patient ID: [name] [name], female    DOB: 10-17-1950, 64 y.o.   MRN: [id number]  HPI Here for several things. First she asks about the pounding in her chest. She has been taking Metoprolol for some time and she is currently on 50 mg bid. She recently had a full workup with Dr. Glennie Hawk and she has non-obstructive CAD. She has a lot of ectopy so beta blockers seem to be a good choice for her to take. However if the dose of metoprolol is increased this drops her heart rate too low. She also has a lot of anxiety and she finds that Xanax helps the pounding in her chest fairly well. However her current dose of 0.5 mg is too low to help much, and increasing the dose to 1 mg really helps the pounding. However she cannot take this regularly due to sedation. Second, she has had one week of chest congestion and a dry cough. No fever.    Review of Systems  Constitutional: Negative.   HENT: Positive for congestion. Negative for postnasal drip and sinus pressure.   Eyes: Negative.   Respiratory: Positive for cough. Negative for shortness of breath.   Cardiovascular: Positive for palpitations. Negative for chest pain and leg swelling.       Objective:   Physical Exam  Constitutional: She appears well-developed and well-nourished.  HENT:  Right Ear: External ear normal.  Left Ear: External ear normal.  Nose: Nose normal.  Mouth/Throat: Oropharynx is clear and moist.  Eyes: Conjunctivae are normal.  Cardiovascular: Normal rate, regular rhythm, normal heart sounds and intact distal pulses.   Pulmonary/Chest: Effort normal. No respiratory distress. She has wheezes. She has no rales.  Lymphadenopathy:    She has no cervical adenopathy.          Assessment & Plan:  Treat the bronchitis with a Zpack. I think she should stay on her currrent dose of metoprolol but we can hopefully get her anxiety under better control. Increase the dose of Xanax to the equivalent of 0.75 mg every 6  hours.

## 2014-05-29 ENCOUNTER — Telehealth: Payer: Self-pay | Admitting: Family Medicine

## 2014-05-29 NOTE — Telephone Encounter (Signed)
Pt said she has to go to work tonight and is not feeling any better and would like to know what else she can do. Pt request a call back .

## 2014-05-29 NOTE — Telephone Encounter (Signed)
Pt was seen on 05-25-14 and has taken last  abx zpak. Pt is little better. Pt has been taking mucinex. Pt still is congestion.[facility] [address]. Please advise. Pt will make other appt if needed

## 2014-05-30 ENCOUNTER — Encounter: Payer: Self-pay | Admitting: Family Medicine

## 2014-05-30 ENCOUNTER — Ambulatory Visit (INDEPENDENT_AMBULATORY_CARE_PROVIDER_SITE_OTHER): Payer: Medicare Other | Admitting: Family Medicine

## 2014-05-30 VITALS — BP 128/88 | HR 60 | Temp 97.8°F | Resp 17 | Wt 164.8 lb

## 2014-05-30 DIAGNOSIS — R062 Wheezing: Secondary | ICD-10-CM | POA: Diagnosis not present

## 2014-05-30 DIAGNOSIS — J209 Acute bronchitis, unspecified: Secondary | ICD-10-CM | POA: Diagnosis not present

## 2014-05-30 DIAGNOSIS — M546 Pain in thoracic spine: Secondary | ICD-10-CM

## 2014-05-30 HISTORY — DX: Pain in thoracic spine: M54.6

## 2014-05-30 MED ORDER — ALBUTEROL SULFATE (2.5 MG/3ML) 0.083% IN NEBU
2.5000 mg | INHALATION_SOLUTION | Freq: Once | RESPIRATORY_TRACT | Status: AC
  Administered 2014-05-30: 2.5 mg via RESPIRATORY_TRACT

## 2014-05-30 MED ORDER — ALBUTEROL SULFATE HFA 108 (90 BASE) MCG/ACT IN AERS: 2.0000 | INHALATION_SPRAY | Freq: Four times a day (QID) | RESPIRATORY_TRACT | Status: DC | PRN

## 2014-05-30 MED ORDER — PREDNISONE 10 MG PO TABS: ORAL_TABLET | ORAL | Status: DC

## 2014-05-30 NOTE — Patient Instructions (Signed)
Follow up as needed Start the Prednisone to improve both your air movement and your back pain You can take tylenol as needed for breakthrough soreness If no improvement or symptoms worsen- please call or go to the ER Call with any questions or concerns Hang in there!!

## 2014-05-30 NOTE — Assessment & Plan Note (Signed)
New.  Pt's sxs not consistent w/ UTI or pyelo- no CVA tenderness, no dysuria, frequency/urgency.  Pt's lungs cleared after neb tx in office w/o obvious crackles or wheeze.  Explained that the only way to r/o PNA would be w/ xray- pt not interested in this due to cost and her recent medical bills.  Pt not having CP- had recent cath.  Will not repeat abx at this time.  Pain may be pleurisy or musculoskeletal but not reproducible on exam.  Start Prednisone.  No need to repeat abx at this time.  Reviewed supportive care and red flags that should prompt return.  Pt expressed understanding and is in agreement w/ plan.

## 2014-05-30 NOTE — Progress Notes (Signed)
   Subjective:    Patient ID: [name] [name], female    DOB: March 17, 1951, 64 y.o.   MRN: [id number]  HPI Back pain- pt was seen on 2/22 and dx'd w/ bronchitis and tx'd w/ Zpack.  + tobacco abuse.  Pt reports Zpack did not improve sxs.  Cough is intermittent and rarely productive.  But having back pain bilaterally that radiates around to front.  'it's just sore'.  Not worse w/ movement.  No burning w/ urination or change in frequency.  Denies increased SOB.  Not using albuterol inhaler- needs a refill.  Back pain started Thursday but worsened on Friday.     Review of Systems For ROS see HPI     Objective:   Physical Exam  Constitutional: She is oriented to person, place, and time. She appears well-developed and well-nourished. No distress.  HENT:  Head: Normocephalic and atraumatic.  Nose: Nose normal.  Mouth/Throat: Oropharynx is clear and moist. No oropharyngeal exudate.  TMs obscured by cerumen  Neck: Normal range of motion. Neck supple.  Cardiovascular: Normal rate, regular rhythm and normal heart sounds.   Pulmonary/Chest:  Poor air movement diffusely- improved s/p neb tx.  No wheezes or crackles heard  Musculoskeletal: She exhibits no tenderness (no CVA tenderness, no TTP over spine).  Lymphadenopathy:    She has no cervical adenopathy.  Neurological: She is alert and oriented to person, place, and time.  Skin: Skin is warm and dry.  Psychiatric: She has a normal mood and affect. Her behavior is normal.  Vitals reviewed.         Assessment & Plan:

## 2014-05-30 NOTE — Progress Notes (Signed)
Pre visit review using our clinic review tool, if applicable. No additional management support is needed unless otherwise documented below in the visit note. 

## 2014-06-01 NOTE — Telephone Encounter (Signed)
Left message for pt to call back  °

## 2014-06-01 NOTE — Telephone Encounter (Signed)
If she is still feeling bad, call in Augmentin [phone] bid for 10 days

## 2014-06-03 NOTE — Telephone Encounter (Signed)
Pt was seen at the [facility] clinic on Saturday 05/30/14 for same reason. She as given a prednisone dose pack and started on that, now she has some itching in the hands & feet, she does not want to stop the medication because it is helping. Per Dr. Sarajane Jews pt can try some oral benadryl over the counter and it should help with the itching. I spoke with pt and went over this information. She will give this a try and call back if needed. She was also given a work note from the Saturday clinic.

## 2014-06-04 ENCOUNTER — Encounter: Payer: Self-pay | Admitting: Cardiovascular Disease

## 2014-06-04 ENCOUNTER — Ambulatory Visit (INDEPENDENT_AMBULATORY_CARE_PROVIDER_SITE_OTHER): Payer: Medicare Other | Admitting: Cardiovascular Disease

## 2014-06-04 VITALS — BP 110/70 | HR 67 | Ht 61.0 in | Wt 163.0 lb

## 2014-06-04 DIAGNOSIS — I493 Ventricular premature depolarization: Secondary | ICD-10-CM | POA: Diagnosis not present

## 2014-06-04 DIAGNOSIS — Z720 Tobacco use: Secondary | ICD-10-CM

## 2014-06-04 DIAGNOSIS — I2510 Atherosclerotic heart disease of native coronary artery without angina pectoris: Secondary | ICD-10-CM

## 2014-06-04 DIAGNOSIS — R002 Palpitations: Secondary | ICD-10-CM

## 2014-06-04 NOTE — Patient Instructions (Signed)
Your physician wants you to follow-up in: 6 months with Dr. Angelena Form. You will receive a reminder letter in the mail two months in advance. If you don't receive a letter, please call our office to schedule the follow-up appointment.  You have been referred to  Dr. Rayann Heman.  Please schedule patient for new patient appt.

## 2014-06-04 NOTE — Progress Notes (Signed)
/    History of Present Illness: 64 yo female with history of HTN, HLD, GERD, OA, depression, ongoing tobacco abuse, palpitations here today for cardiac follow up. I saw her as a new patient 08/12/13 for cardiovascular examination and risk assessment. She told me that she has been on a beta blocker for years. She had been on metoprolol in the past and now is on Propranolol. She c/o episodes of weakness and feels that her heart rate is slow. She also described dyspnea on exertion. She has no prior documented CAD. She had a stress test remotely.  She also has exertional chest pain occasionally but not more than once per month.  EKG from 06/27/13 with sinus, PVCs. No LE edema. I arranged an echo on 09/03/13 which showed normal LV size and function, possible bicuspid aortic valve, mildly dilated aortic root with possible dissection flap in aorta. CTA chest with no evidence of aortic dissection, thoracic aorta 3.7 cm. Coronary calcification noted. Event monitor showed sinus rhythm with PVCs, bigeminy. Inderal changed to metoprolol. Given ongoing dyspnea and chest pressure, I arranged a right and left heart cath on 04/22/14. She was found to have a moderate mid LAD stenosis (50-60% stenosis). FFR was 0.82-0.85 suggesting this stenosis was not flow limiting. The Circumflex and RCA were free of significant disease.   She is here today for follow up. She continues to have dyspnea and is being treated for a URI by Dr. Sharlene Motts. She has no chest pain. She is very concerned about her palpitations which seem to be worsened and are now making her feel weak and dizzy. She describes daily palpitations with tightness in her neck when she notices these. She then feels weak and dizzy. Taking metoprolol 50 mg twice daily but still having frequent palpitations and noting HR of 35-45 bpm at times on her BP cuff at home. Of note, HR dropped to 45 in post cath holding and she felt dizzy. She is still smoking. She tells me that she cannot keep  working if she continues having these palpitations.   Primary Care Physician: Sharlene Motts  Last Lipid Profile:Lipid Panel     Component Value Date/Time   CHOL 256* 06/27/2013 1011   TRIG 168.0* 06/27/2013 1011   HDL 54.10 06/27/2013 1011   CHOLHDL 5 06/27/2013 1011   VLDL 33.6 06/27/2013 1011   LDLCALC 168* 06/27/2013 1011     Past Medical History  Diagnosis Date  . Depression   . GERD (gastroesophageal reflux disease)   . Osteoarthritis   . Allergic rhinitis   . Headache(784.0)   . Hyperlipidemia   . Hypertension   . Insomnia   . Overactive bladder   . Dexamethasone adverse reaction     2002 normal  . Allergy     seasonal  . Anxiety   . Cataract   . Ulcer     duodenal    Past Surgical History  Procedure Laterality Date  . Tonsillectomy    . Carpal tunnel release    . Torn cartilage repaired rt wrist  2003  . Colonoscopy  2005    per Dr. Olevia Perches, polpys, repeat in 5 years  . Left and right heart catheterization with coronary angiogram N/A 04/22/2014    Procedure: LEFT AND RIGHT HEART CATHETERIZATION WITH CORONARY ANGIOGRAM;  Surgeon: Burnell Blanks, MD;  Location: [facility] CATH LAB;  Service: Cardiovascular;  Laterality: N/A;    Current Outpatient Prescriptions  Medication Sig Dispense Refill  . albuterol (PROVENTIL HFA;VENTOLIN HFA) 108 (90  BASE) MCG/ACT inhaler Inhale 2 puffs into the lungs every 6 (six) hours as needed for wheezing. 1 Inhaler 1  . ALPRAZolam (XANAX) 0.25 MG tablet Take 3 tablets (0.75 mg total) by mouth every 6 (six) hours as needed for anxiety. 360 tablet 5  . atorvastatin (LIPITOR) 20 MG tablet Take 1 tablet (20 mg total) by mouth daily. 30 tablet 11  . cetirizine (ZYRTEC) 10 MG tablet Take 10 mg by mouth daily.    Marland Kitchen HYDROcodone-acetaminophen (NORCO/VICODIN) 5-325 MG per tablet Take 1 tablet by mouth every 6 (six) hours as needed for moderate pain. 120 tablet 0  . ibuprofen (ADVIL,MOTRIN) 200 MG tablet Take 200 mg by mouth every 6 (six) hours as  needed for moderate pain.     Marland Kitchen lisinopril-hydrochlorothiazide (PRINZIDE,ZESTORETIC) 10-12.5 MG per tablet TAKE 1 TABLET BY MOUTH EVERY DAY 90 tablet 3  . metoprolol (LOPRESSOR) 50 MG tablet Take 1 tablet (50 mg total) by mouth 2 (two) times daily. 60 tablet 11  . predniSONE (DELTASONE) 10 MG tablet 3 tabs x3 days and then 2 tabs x3 days and then 1 tab x3 days.  Take w/ food. 18 tablet 0  . ranitidine (ZANTAC) 150 MG capsule Take 150 mg by mouth as needed for heartburn.     No current facility-administered medications for this visit.    Allergies  Allergen Reactions  . Dexamethasone Other (See Comments)    Pt thinks they used it during surgery and she quit breathing, not sure this was the exact medication.  . Oxycodone-Acetaminophen     REACTION: n/v    History   Social History  . Marital Status: Single    Spouse Name: N/A  . Number of Children: 0  . Years of Education: N/A   Occupational History  . CNA    Social History Main Topics  . Smoking status: Current Every Day Smoker -- 0.75 packs/day for 52 years    Types: Cigarettes  . Smokeless tobacco: Never Used     Comment: 1/2 pack per day or less  . Alcohol Use: No  . Drug Use: No  . Sexual Activity: Not on file   Other Topics Concern  . Not on file   Social History Narrative    Family History  Problem Relation Age of Onset  . Hyperlipidemia    . Hypertension    . Kidney disease    . CAD Brother   . Emphysema Mother   . Colon cancer Neg Hx     Review of Systems:  As stated in the HPI and otherwise negative.   BP 110/70 mmHg  Pulse 67  Ht 5\' 1"  (1.549 m)  Wt 163 lb (73.936 kg)  BMI 30.81 kg/m2  SpO2 95%  Physical Examination: General: Well developed, well nourished, NAD HEENT: OP clear, mucus membranes moist SKIN: warm, dry. No rashes. Neuro: No focal deficits Musculoskeletal: Muscle strength 5/5 all ext Psychiatric: Mood and affect normal Neck: No JVD, no carotid bruits, no thyromegaly, no  lymphadenopathy. Lungs:Clear bilaterally, no wheezes, rhonci, crackles Cardiovascular: Regular rate and rhythm. No murmurs, gallops or rubs. Abdomen:Soft. Bowel sounds present. Non-tender.  Extremities: No lower extremity edema. Pulses are 2 + in the bilateral DP/PT.  Echo 09/03/13: Left ventricle: The cavity size was normal. Systolic function was normal. The estimated ejection fraction was in the range of 60% to 65%. Wall motion was normal; there were no regional wall motion abnormalities. - Aortic valve: Possibly bicuspid; normal thickness leaflets. - Aorta: Dilated aortic root  and ascending aorta measuring 40 mm. - Mitral valve: Structurally normal valve. There was no regurgitation. - Left atrium: The atrium was normal in size. - Right ventricle: The cavity size was normal. Wall thickness was normal. - Right atrium: The atrium was normal in size. - Tricuspid valve: Structurally normal valve. There was no regurgitation. - Pulmonic valve: Structurally normal valve. - Inferior vena cava: The vessel was normal in size. - Pericardium, extracardiac: There was no pericardial effusion. Impressions: - Dilated aortic root and ascending aorta measuring 40 mm. There is possible dissection flap in the ascending aorta that needs to be further evaluated by either TEE or CTA.  Chest CTA 09/29/13:  1. Prominent ascending aorta measuring 3.7 cm. 2. Thoracic aorta atherosclerosis without dissection. 3. Age advanced coronary artery atherosclerosis. Recommend assessment of coronary risk factors and consideration of medical therapy. 4. Faint airspace disease especially involving the upper lobes, consistent with alveolitis. Correlation is recommended with pulmonary symptoms. 5. Small right thyroid nodule. No further evaluation is felt to be necessary. This follows ACR consensus guidelines: Managing Incidental Thyroid Nodules Detected on Imaging: White Paper of the ACR Incidental Thyroid Findings  Committee. J Am Coll Radiol 2015; 12:143-150. 6. Possible adrenal hyperplasia.  Cardiac cath 04/22/14: Hemodynamic Findings: Ao: 134/74  LV: 132/8/18 RA: 1  RV: 38/4/8 PA: 33/12 (mean 21) PCWP: 4 Fick Cardiac Output: 3.8 L/min Fick Cardiac Index: 2.19 L/min/m2 Central Aortic Saturation: 99% Pulmonary Artery Saturation: 69%  Angiographic Findings: Left main: No obstructive disease Left Anterior Descending Artery: Large caliber vessel that courses to the apex. The proximal vessel has diffuse 20% stenosis. The mid vessel has a 50-60% stenosis. There is a small septal branch that arises from this plaque with ostial 90% stenosis. Several small caliber diagonal branches.  Circumflex Artery: Large caliber vessel with large caliber first obtuse marginal branch. No obstructive disease.  Right Coronary Artery: Large dominant vessel with proximal and mid calcification, diffuse 20% stenosis in the proximal and mid vessel.  Left Ventricular Angiogram: LVEF=55-60%.  Impression: 1. Moderate non-obstructive CAD 2. Moderate mid LAD stenosis with FFR borderline at 0.82.  3. Normal LV systolic function  EKG:   Assessment and Plan:   1. Palpitations/PVCs: She appears to be symptomatic from her PVCs. Recent event monitor shows frequent PVCs with bigeminy. She is describing palpitations with pressure in her throat when she notices these associated with weakness and dizziness. She has seen no improvement on metoprolol. She has been noted at home and while in the recovery from her cath to have bradycardia with heart rates in the 30-40s. I do not think I can titrate her beta blocker further as she is symptomatic when she is bradycardic. As above, she does have CAD althought not felt to be obstructive. Her LV function is normal. She continues to smoke and this is likely making her PVCs worse. I will refer her to see EP to discuss treatment strategies and possible PVC ablation as I  think medical management will be difficult. She is very symptomatic and I think this is primary driven by her PVCs.   2. CAD: Moderate LAD stenosis by cath 04/22/14. FFR of the LAD showed that this lesion is not flow limiting. Will continue beta blocker and statin  3. Tobacco abuse: Smoking cessation is encouraged.   4. Bicuspid aortic valve: No aortic stenosis at this time. Will follow

## 2014-06-14 ENCOUNTER — Other Ambulatory Visit: Payer: Self-pay | Admitting: Family Medicine

## 2014-06-18 ENCOUNTER — Telehealth: Payer: Self-pay | Admitting: Family Medicine

## 2014-06-18 NOTE — Telephone Encounter (Signed)
PA was approved.  Called pharmacy and it is currently a re-fill to soon, next fill date is 06/21/14.  Tried to call patient but voicemail didn't have patient's name on it for me to know if I was calling the correct number, did not leave a message.

## 2014-06-18 NOTE — Telephone Encounter (Signed)
Received a call from [facility] stating patient received a letter stating she needs a PA for Xanax.  PA has been submitted. [id number]

## 2014-07-15 ENCOUNTER — Other Ambulatory Visit: Payer: Self-pay

## 2014-07-16 ENCOUNTER — Ambulatory Visit (INDEPENDENT_AMBULATORY_CARE_PROVIDER_SITE_OTHER): Payer: Medicare Other | Admitting: Internal Medicine

## 2014-07-16 ENCOUNTER — Encounter: Payer: Self-pay | Admitting: Internal Medicine

## 2014-07-16 VITALS — BP 120/78 | HR 65 | Ht 61.0 in | Wt 161.6 lb

## 2014-07-16 DIAGNOSIS — Z720 Tobacco use: Secondary | ICD-10-CM | POA: Diagnosis not present

## 2014-07-16 DIAGNOSIS — I10 Essential (primary) hypertension: Secondary | ICD-10-CM | POA: Diagnosis not present

## 2014-07-16 DIAGNOSIS — F17200 Nicotine dependence, unspecified, uncomplicated: Secondary | ICD-10-CM

## 2014-07-16 DIAGNOSIS — I493 Ventricular premature depolarization: Secondary | ICD-10-CM | POA: Diagnosis not present

## 2014-07-16 NOTE — Progress Notes (Signed)
Electrophysiology Office Note   Date:  07/16/2014   ID:  [name] [name], DOB 1951/02/22, MRN [id number]  PCP:  [name], MD  Cardiologist:  Dr Angelena Form Primary Electrophysiologist: Thompson Grayer, MD    Chief Complaint  Patient presents with  . Palpitations     History of Present Illness: [name] [name] is a 64 y.o. female who presents today for electrophysiology evaluation.   She presents for evaluation of PVCs.  She reports frequent heart pounding with fatigue and weakness.  She is unware of triggers or precipitants.  She has tried metoprolol with some relief.   She also has dyspnea on exertion.  She is a heavy smoker. Echo on 09/03/13 showed normal LV size and function, possible bicuspid aortic valve, mildly dilated aortic root with possible dissection flap in aorta. CTA chest with no evidence of aortic dissection, thoracic aorta 3.7 cm. Coronary calcification noted. Event monitor showed sinus rhythm with PVCs, bigeminy. Inderal changed to metoprolol.  She is s/p heart cath on 04/22/14. She was found to have a moderate mid LAD stenosis (50-60% stenosis). FFR was 0.82-0.85 suggesting this stenosis was not flow limiting. The Circumflex and RCA were free of significant disease.   Today, she denies symptoms of  chest pain,   orthopnea, PND, lower extremity edema, claudication, dizziness, presyncope, syncope, bleeding, or neurologic sequela. The patient is tolerating medications without difficulties and is otherwise without complaint today.    Past Medical History  Diagnosis Date  . Depression   . GERD (gastroesophageal reflux disease)   . Osteoarthritis   . Allergic rhinitis   . Headache(784.0)   . Hyperlipidemia   . Hypertension   . Insomnia   . Overactive bladder   . Dexamethasone adverse reaction     2002 normal  . Allergy     seasonal  . Anxiety   . Cataract   . Ulcer     duodenal  . Premature ventricular contractions     LBBB inferior axis PVCs  . Tobacco abuse     Past Surgical History  Procedure Laterality Date  . Tonsillectomy    . Carpal tunnel release    . Torn cartilage repaired rt wrist  2003  . Colonoscopy  2005    per Dr. Olevia Perches, polpys, repeat in 5 years  . Left and right heart catheterization with coronary angiogram N/A 04/22/2014    Procedure: LEFT AND RIGHT HEART CATHETERIZATION WITH CORONARY ANGIOGRAM;  Surgeon: Burnell Blanks, MD;  Location: [facility] CATH LAB;  Service: Cardiovascular;  Laterality: N/A;     Current Outpatient Prescriptions  Medication Sig Dispense Refill  . albuterol (PROVENTIL HFA;VENTOLIN HFA) 108 (90 BASE) MCG/ACT inhaler Inhale 2 puffs into the lungs every 6 (six) hours as needed for wheezing. 1 Inhaler 1  . ALPRAZolam (XANAX) 0.25 MG tablet Take 3 tablets (0.75 mg total) by mouth every 6 (six) hours as needed for anxiety. 360 tablet 5  . atorvastatin (LIPITOR) 20 MG tablet TAKE 1 TABLET DAILY (Patient taking differently: TAKE 1 TABLET BY MOUTH DAILY) 30 tablet 1  . cetirizine (ZYRTEC) 10 MG tablet Take 10 mg by mouth daily.    Marland Kitchen HYDROcodone-acetaminophen (NORCO/VICODIN) 5-325 MG per tablet Take 1 tablet by mouth every 6 (six) hours as needed for moderate pain. 120 tablet 0  . ibuprofen (ADVIL,MOTRIN) 200 MG tablet Take 200 mg by mouth every 6 (six) hours as needed for moderate pain.     Marland Kitchen lisinopril-hydrochlorothiazide (PRINZIDE,ZESTORETIC) 10-12.5 MG per tablet TAKE 1 TABLET  BY MOUTH EVERY DAY 90 tablet 3  . metoprolol (LOPRESSOR) 50 MG tablet Take 1 tablet (50 mg total) by mouth 2 (two) times daily. 60 tablet 11  . ranitidine (ZANTAC) 150 MG capsule Take 150 mg by mouth daily as needed for heartburn.      No current facility-administered medications for this visit.    Allergies:   Dexamethasone and Oxycodone-acetaminophen   Social History:  The patient  reports that she has been smoking Cigarettes.  She has a 39 pack-year smoking history. She has never used smokeless tobacco. She reports that she does not  drink alcohol or use illicit drugs.   Family History:  The patient's  family history includes CAD in her brother; Emphysema in her mother; Hyperlipidemia in an other family member; Hypertension in an other family member; Kidney disease in an other family member. There is no history of Colon cancer.    ROS:  Please see the history of present illness.   All other systems are reviewed and negative.    PHYSICAL EXAM: VS:  BP 120/78 mmHg  Pulse 65  Ht 5\' 1"  (1.549 m)  Wt 161 lb 9.6 oz (73.301 kg)  BMI 30.55 kg/m2 , BMI Body mass index is 30.55 kg/(m^2). GEN: Well nourished, well developed, in no acute distress, smells heavily of tobacco HEENT: normal, poor dentition, Neck: no JVD, carotid bruits, or masses Cardiac: RRR with frequent ectopy; no murmurs, rubs, or gallops,no edema  Respiratory:  clear to auscultation bilaterally, normal work of breathing GI: soft, nontender, nondistended, + BS MS: no deformity or atrophy Skin: warm and dry  Neuro:  Strength and sensation are intact Psych: euthymic mood, full affect  EKG:  EKG is ordered today. The ekg ordered today shows sinus rhythm 65 bpm, LBBB inferior axis pvcs with precordial transition at V3.   Recent Labs: 04/20/2014: BUN 17; Creatinine 0.88; Hemoglobin 14.9; Platelets 307.0; Potassium 4.3; Sodium 135    Lipid Panel     Component Value Date/Time   CHOL 256* 06/27/2013 1011   TRIG 168.0* 06/27/2013 1011   HDL 54.10 06/27/2013 1011   CHOLHDL 5 06/27/2013 1011   VLDL 33.6 06/27/2013 1011   LDLCALC 168* 06/27/2013 1011   LDLDIRECT 130.9 11/16/2008 1013     Wt Readings from Last 3 Encounters:  07/16/14 161 lb 9.6 oz (73.301 kg)  06/04/14 163 lb (73.936 kg)  05/30/14 164 lb 12 oz (74.73 kg)      Other studies Reviewed: Additional studies/ records that were reviewed today include: Dr Camillia Herter notes are reviewed    ASSESSMENT AND PLAN:  1.  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  She is  quite symptomatic with these.  At this point, I will advise 24 hour holter to better characterize the burden/ frequency of PVCs.  Given nonobstructive CAD and significant symptoms, I would consider initiation of low dose flecainide at that time.  I would reserve ablation for medicine refractory PVCs.  2. Tobacco Smells heavily of tobacco She needs to quit.  She becomes angry when I suggest that she quit and does not wish to discuss further.  3. HTN Stable No change required today   Current medicines are reviewed at length with the patient today.   The patient does not have concerns regarding her medicines.  The following changes were made today:  none  Follow-up: return to see me in 2weeks to discuss results of holter   Signed, Thompson Grayer, MD  07/16/2014 12:21  PM     [address] [address] [phone] (office) [phone] (fax)

## 2014-07-16 NOTE — Patient Instructions (Signed)
Your physician recommends that you schedule a follow-up appointment in: 2 weeks with Dr Rayann Heman  Your physician has recommended that you wear a holter monitor. Holter monitors are medical devices that record the heart's electrical activity. Doctors most often use these monitors to diagnose arrhythmias. Arrhythmias are problems with the speed or rhythm of the heartbeat. The monitor is a small, portable device. You can wear one while you do your normal daily activities. This is usually used to diagnose what is causing palpitations/syncope (passing out).

## 2014-07-22 ENCOUNTER — Encounter: Payer: Self-pay | Admitting: *Deleted

## 2014-07-22 ENCOUNTER — Encounter (INDEPENDENT_AMBULATORY_CARE_PROVIDER_SITE_OTHER): Payer: Medicare Other

## 2014-07-22 DIAGNOSIS — I493 Ventricular premature depolarization: Secondary | ICD-10-CM | POA: Diagnosis not present

## 2014-07-22 NOTE — Progress Notes (Unsigned)
Patient ID: [name] [name], female   DOB: November 15, 1950, 64 y.o.   MRN: [id number] [facility] 24 hour holter monitor applied

## 2014-07-28 ENCOUNTER — Other Ambulatory Visit: Payer: Self-pay

## 2014-07-29 ENCOUNTER — Ambulatory Visit (INDEPENDENT_AMBULATORY_CARE_PROVIDER_SITE_OTHER): Payer: Medicare Other | Admitting: Internal Medicine

## 2014-07-29 ENCOUNTER — Encounter: Payer: Self-pay | Admitting: Internal Medicine

## 2014-07-29 VITALS — BP 100/62 | HR 72 | Ht 61.0 in | Wt 162.0 lb

## 2014-07-29 DIAGNOSIS — I493 Ventricular premature depolarization: Secondary | ICD-10-CM | POA: Diagnosis not present

## 2014-07-29 DIAGNOSIS — I10 Essential (primary) hypertension: Secondary | ICD-10-CM | POA: Diagnosis not present

## 2014-07-29 MED ORDER — LISINOPRIL 10 MG PO TABS: 10.0000 mg | ORAL_TABLET | Freq: Every day | ORAL | Status: DC

## 2014-07-29 MED ORDER — FLECAINIDE ACETATE 50 MG PO TABS: 50.0000 mg | ORAL_TABLET | Freq: Two times a day (BID) | ORAL | Status: DC

## 2014-07-29 NOTE — Patient Instructions (Signed)
Medication Instructions:  Your physician has recommended you make the following change in your medication:  1) Start Flecainide 50 mg twice daily 2) Start Lisinopril 10 mg daily 3) Stop Lisinipril/HCTZ   Labwork: None ordered  Testing/Procedures: None ordered  Follow-Up: Your physician recommends that you schedule a follow-up appointment in: 4 weeks with Chanetta Marshall, NP   Any Other Special Instructions Will Be Listed Below (If Applicable).

## 2014-07-31 ENCOUNTER — Encounter: Payer: Self-pay | Admitting: Internal Medicine

## 2014-07-31 NOTE — Progress Notes (Signed)
Electrophysiology Office Note   Date:  07/31/2014   ID:  [name] [name], DOB 1950/04/25, MRN [id number]  PCP:  [name], MD  Cardiologist:  Dr Angelena Form Primary Electrophysiologist: Thompson Grayer, MD    Chief Complaint  Patient presents with  . PVCs     History of Present Illness: [name] [name] is a 64 y.o. female who presents today for electrophysiology follow-up.   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  With nadolol, episodes have improved but not resolved.  She is unware of triggers or precipitants.    She also has dyspnea on exertion.  She is a heavy smoker.  Echo on 09/03/13 showed normal LV size and function, possible bicuspid aortic valve, mildly dilated aortic root with possible dissection flap in aorta. CTA chest with no evidence of aortic dissection, thoracic aorta 3.7 cm. Coronary calcification noted. Event monitor showed sinus rhythm with PVCs, bigeminy. Inderal changed to metoprolol.  She is s/p heart cath on 04/22/14. She was found to have a moderate mid LAD stenosis (50-60% stenosis). FFR was 0.82-0.85 suggesting this stenosis was not flow limiting. The Circumflex and RCA were free of significant disease.   Today, she denies symptoms of  chest pain,   orthopnea, PND, lower extremity edema, claudication, dizziness, presyncope, syncope, bleeding, or neurologic sequela. The patient is tolerating medications without difficulties and is otherwise without complaint today.    Past Medical History  Diagnosis Date  . Depression   . GERD (gastroesophageal reflux disease)   . Osteoarthritis   . Allergic rhinitis   . Headache(784.0)   . Hyperlipidemia   . Hypertension   . Insomnia   . Overactive bladder   . Dexamethasone adverse reaction     2002 normal  . Allergy     seasonal  . Anxiety   . Cataract   . Ulcer     duodenal  . Premature ventricular contractions     LBBB inferior axis PVCs  . Tobacco abuse    Past Surgical History  Procedure  Laterality Date  . Tonsillectomy    . Carpal tunnel release    . Torn cartilage repaired rt wrist  2003  . Colonoscopy  2005    per Dr. Olevia Perches, polpys, repeat in 5 years  . Left and right heart catheterization with coronary angiogram N/A 04/22/2014    Procedure: LEFT AND RIGHT HEART CATHETERIZATION WITH CORONARY ANGIOGRAM;  Surgeon: Burnell Blanks, MD;  Location: [facility] CATH LAB;  Service: Cardiovascular;  Laterality: N/A;     Current Outpatient Prescriptions  Medication Sig Dispense Refill  . albuterol (PROVENTIL HFA;VENTOLIN HFA) 108 (90 BASE) MCG/ACT inhaler Inhale 2 puffs into the lungs every 6 (six) hours as needed for wheezing. 1 Inhaler 1  . ALPRAZolam (XANAX) 0.25 MG tablet Take 3 tablets (0.75 mg total) by mouth every 6 (six) hours as needed for anxiety. 360 tablet 5  . atorvastatin (LIPITOR) 20 MG tablet TAKE 1 TABLET DAILY (Patient taking differently: TAKE 1 TABLET BY MOUTH DAILY) 30 tablet 1  . cetirizine (ZYRTEC) 10 MG tablet Take 10 mg by mouth daily.    Marland Kitchen HYDROcodone-acetaminophen (NORCO/VICODIN) 5-325 MG per tablet Take 1 tablet by mouth every 6 (six) hours as needed for moderate pain. 120 tablet 0  . ibuprofen (ADVIL,MOTRIN) 200 MG tablet Take 400 mg by mouth every 6 (six) hours as needed for moderate pain.     . metoprolol (LOPRESSOR) 50 MG tablet Take 1 tablet (50 mg total) by  mouth 2 (two) times daily. 60 tablet 11  . ranitidine (ZANTAC) 150 MG capsule Take 150 mg by mouth daily as needed for heartburn.     . flecainide (TAMBOCOR) 50 MG tablet Take 1 tablet (50 mg total) by mouth 2 (two) times daily. 180 tablet 3  . lisinopril (PRINIVIL,ZESTRIL) 10 MG tablet Take 1 tablet (10 mg total) by mouth daily. 90 tablet 3   No current facility-administered medications for this visit.    Allergies:   Dexamethasone and Oxycodone-acetaminophen   Social History:  The patient  reports that she has been smoking Cigarettes.  She has a 39 pack-year smoking history. She has never  used smokeless tobacco. She reports that she does not drink alcohol or use illicit drugs.   Family History:  The patient's  family history includes CAD in her brother; Emphysema in her mother; Hyperlipidemia in an other family member; Hypertension in an other family member; Kidney disease in an other family member. There is no history of Colon cancer.    ROS:  Please see the history of present illness.   All other systems are reviewed and negative.    PHYSICAL EXAM: VS:  BP 100/62 mmHg  Pulse 72  Ht 5\' 1"  (1.549 m)  Wt 162 lb (73.483 kg)  BMI 30.63 kg/m2 , BMI Body mass index is 30.63 kg/(m^2). GEN: Well nourished, well developed, in no acute distress, smells heavily of tobacco HEENT: normal, poor dentition, Neck: no JVD, carotid bruits, or masses Cardiac: RRR with frequent ectopy; no murmurs, rubs, or gallops,no edema  Respiratory:  clear to auscultation bilaterally, normal work of breathing GI: soft, nontender, nondistended, + BS MS: no deformity or atrophy Skin: warm and dry  Neuro:  Strength and sensation are intact Psych: euthymic mood, full affect  EKG:  EKG is ordered today. The ekg ordered today shows sinus rhythm today.   Recent Labs: 04/20/2014: BUN 17; Creatinine 0.88; Hemoglobin 14.9; Platelets 307.0; Potassium 4.3; Sodium 135    Lipid Panel     Component Value Date/Time   CHOL 256* 06/27/2013 1011   TRIG 168.0* 06/27/2013 1011   HDL 54.10 06/27/2013 1011   CHOLHDL 5 06/27/2013 1011   VLDL 33.6 06/27/2013 1011   LDLCALC 168* 06/27/2013 1011   LDLDIRECT 130.9 11/16/2008 1013     Wt Readings from Last 3 Encounters:  07/29/14 162 lb (73.483 kg)  07/16/14 161 lb 9.6 oz (73.301 kg)  06/04/14 163 lb (73.936 kg)      ASSESSMENT AND PLAN:  1.  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  She is quite symptomatic with these. 24 hour monitor is reviewed and reveals modest PVC burden.  Given nonobstructive CAD and significant symptoms, I  would favor initiation of flecainide at this time.  I have therefore started flecainide 50mg  BID.   I would reserve ablation for medicine refractory PVCs.  2. Tobacco Smells heavily of tobacco She needs to quit.  She becomes angry when I suggest that she quit and does not wish to discuss further.  3. HTN Stable No change required today  Current medicines are reviewed at length with the patient today.   The patient does not have concerns regarding her medicines.  The following changes were made today:  none  Follow-up with EP NP in 2 weeks for ekg and follow-up.  If PVCs are improved, would favor continuing this strategy going forward.  IF not improved, could either consider increasing flecainide to 100mg  BID or  scheduling for ablation with me.  Would hold all medicine 72 hours prior to ablation.  I worry that she may not have PVCs at the time of her ablation.   Army Fossa, MD  07/31/2014 11:22 AM     [facility] [address] [place] [address] [phone] (office) [phone] (fax)

## 2014-08-12 ENCOUNTER — Other Ambulatory Visit: Payer: Self-pay | Admitting: Family Medicine

## 2014-08-25 ENCOUNTER — Telehealth: Payer: Self-pay

## 2014-08-25 NOTE — Progress Notes (Signed)
Electrophysiology Office Note Date: 08/26/2014  ID:  [name] [name], DOB 1950-08-17, MRN [id number]  PCP: [name], MD Primary Cardiologist: Julianne Handler Electrophysiologist: Allred  CC: follow up on PVC's  [name] [name] is a 64 y.o. female is seen today for Dr Rayann Heman.  She presents today for routine electrophysiology followup.  She has symptomiatic PVC's and has failed medical therapy with Nadolol.  At last visit with Dr Rayann Heman, she was started on Flecainide 50mg  twice daily with planned follow up today.  Since last being seen in our clinic, the patient reports doing reasonably well.  She is struggling with fatigue and daytime somnolence. Her PVC burden is improved but not resolved. She feels that they are most bothersome at work or when active. She denies chest pain, palpitations, dyspnea, PND, orthopnea, nausea, vomiting, dizziness, syncope.  Past Medical History  Diagnosis Date  . Depression   . GERD (gastroesophageal reflux disease)   . Osteoarthritis   . Allergic rhinitis   . Headache(784.0)   . Hyperlipidemia   . Hypertension   . Insomnia   . Overactive bladder   . Dexamethasone adverse reaction     2002 normal  . Allergy     seasonal  . Anxiety   . Cataract   . Ulcer     duodenal  . Premature ventricular contractions     LBBB inferior axis PVCs  . Tobacco abuse    Past Surgical History  Procedure Laterality Date  . Tonsillectomy    . Carpal tunnel release    . Torn cartilage repaired rt wrist  2003  . Colonoscopy  2005    per Dr. Olevia Perches, polpys, repeat in 5 years  . Left and right heart catheterization with coronary angiogram N/A 04/22/2014    Procedure: LEFT AND RIGHT HEART CATHETERIZATION WITH CORONARY ANGIOGRAM;  Surgeon: Burnell Blanks, MD;  Location: [facility] CATH LAB;  Service: Cardiovascular;  Laterality: N/A;    Current Outpatient Prescriptions  Medication Sig Dispense Refill  . albuterol (PROVENTIL HFA;VENTOLIN HFA) 108 (90 BASE) MCG/ACT  inhaler Inhale 2 puffs into the lungs every 6 (six) hours as needed for wheezing. 1 Inhaler 1  . ALPRAZolam (XANAX) 0.25 MG tablet Take 3 tablets (0.75 mg total) by mouth every 6 (six) hours as needed for anxiety. 360 tablet 5  . atorvastatin (LIPITOR) 20 MG tablet TAKE 1 TABLET BY MOUTH EVERY DAY 30 tablet 1  . cetirizine (ZYRTEC) 10 MG tablet Take 10 mg by mouth daily.    . flecainide (TAMBOCOR) 50 MG tablet Take 1 tablet (50 mg total) by mouth 2 (two) times daily. 180 tablet 3  . HYDROcodone-acetaminophen (NORCO/VICODIN) 5-325 MG per tablet Take 1 tablet by mouth every 6 (six) hours as needed for moderate pain. 120 tablet 0  . ibuprofen (ADVIL,MOTRIN) 200 MG tablet Take 400 mg by mouth every 6 (six) hours as needed for moderate pain.     Marland Kitchen lisinopril (PRINIVIL,ZESTRIL) 10 MG tablet Take 1 tablet (10 mg total) by mouth daily. 90 tablet 3  . metoprolol (LOPRESSOR) 50 MG tablet Take 1 tablet (50 mg total) by mouth 2 (two) times daily. 60 tablet 11  . ranitidine (ZANTAC) 150 MG capsule Take 150 mg by mouth daily as needed for heartburn.      No current facility-administered medications for this visit.    Allergies:   Dexamethasone and Oxycodone-acetaminophen   Social History: History   Social History  . Marital Status: Single    Spouse Name: N/A  .  Number of Children: 0  . Years of Education: N/A   Occupational History  . CNA    Social History Main Topics  . Smoking status: Current Every Day Smoker -- 0.75 packs/day for 52 years    Types: Cigarettes  . Smokeless tobacco: Never Used     Comment: 1/2 pack per day or less  . Alcohol Use: No  . Drug Use: No  . Sexual Activity: Not on file   Other Topics Concern  . Not on file   Social History Narrative   Works as Quarry manager at [employer].    Family History: Family History  Problem Relation Age of Onset  . Hyperlipidemia    . Hypertension    . Kidney disease    . CAD Brother   . Emphysema Mother   . Colon cancer Neg Hx      Review of Systems: All other systems reviewed and are otherwise negative except as noted above.   Physical Exam: VS:  BP 152/98 mmHg  Pulse 61  Ht 5\' 1"  (1.549 m)  Wt 163 lb (73.936 kg)  BMI 30.81 kg/m2 , BMI Body mass index is 30.81 kg/(m^2). Wt Readings from Last 3 Encounters:  08/26/14 163 lb (73.936 kg)  07/29/14 162 lb (73.483 kg)  07/16/14 161 lb 9.6 oz (73.301 kg)    GEN- The patient is elderly appearing, alert and oriented x 3 today +tobacco smell HEENT: normocephalic, atraumatic; sclera clear, conjunctiva pink; hearing intact; oropharynx clear; neck supple, no JVP Lymph- no cervical lymphadenopathy Lungs- Clear to ausculation bilaterally, normal work of breathing.  No wheezes, rales, rhonchi Heart- Regular rate and rhythm, no murmurs, rubs or gallops, PMI not laterally displaced GI- soft, non-tender, non-distended, bowel sounds present, no hepatosplenomegaly Extremities- no clubbing, cyanosis, or edema; DP/PT/radial pulses 2+ bilaterally MS- no significant deformity or atrophy Skin- warm and dry, no rash or lesion  Psych- euthymic mood, full affect Neuro- strength and sensation are intact   EKG:  EKG is ordered today. The ekg ordered today shows sinus rhythm, rate 61, normal intervals  Recent Labs: 04/20/2014: BUN 17; Creatinine 0.88; Hemoglobin 14.9; Platelets 307.0; Potassium 4.3; Sodium 135    Other studies Reviewed: Additional studies/ records that were reviewed today include: Dr Jackalyn Lombard office note  Assessment and Plan: 1.  Symptomatic PVC's She has symptomatic PVC's and has failed medial therapy with nadolol. She was started on Flecainide 50mg  twice daily at last office visit.  Today, she reports that her PVC burden is improved. Will continue Flecainide 50mg  twice daily for now.   2.  Tobacco abuse Cessation advised  3.  HTN Blood pressure elevated in office today but has been stable at home.  No changes today  4.  Fatigue/daytime somnolence The  patient has been having worsening fatigue and daytime somnolence.  She is unsure if she snores. She has not seen her PCP recently for physical. Will check TSH, BMET, CBC today in anticipation of that visit. Will also order sleep study to evaluate for sleep apnea.    5.  Depression Previously on SSRI but with little benefit. She is currently taking Xanax as prescribed by her PCP.  Recommended following up with PCP for alternative therapies.     Current medicines are reviewed at length with the patient today.   The patient does not have concerns regarding her medicines.  The following changes were made today: none  Labs/ tests ordered today include: BMET, CBC, TSH, sleep study   Disposition:  Follow up with Dr Rayann Heman in 6 months, follow up with Dr Julianne Handler as scheduled   Signed, Chanetta Marshall, NP 08/26/2014 1:38 PM   [place] [address] [phone] (office) [phone] (fax)

## 2014-08-25 NOTE — Telephone Encounter (Signed)
Left message for pt to call back concerning need for mammogram 

## 2014-08-26 ENCOUNTER — Ambulatory Visit (INDEPENDENT_AMBULATORY_CARE_PROVIDER_SITE_OTHER): Payer: Medicare Other | Admitting: Nurse Practitioner

## 2014-08-26 ENCOUNTER — Encounter: Payer: Self-pay | Admitting: Nurse Practitioner

## 2014-08-26 VITALS — BP 152/98 | HR 61 | Ht 61.0 in | Wt 163.0 lb

## 2014-08-26 DIAGNOSIS — I493 Ventricular premature depolarization: Secondary | ICD-10-CM | POA: Diagnosis not present

## 2014-08-26 DIAGNOSIS — Z79899 Other long term (current) drug therapy: Secondary | ICD-10-CM | POA: Diagnosis not present

## 2014-08-26 DIAGNOSIS — R400 Somnolence: Secondary | ICD-10-CM

## 2014-08-26 DIAGNOSIS — G4710 Hypersomnia, unspecified: Secondary | ICD-10-CM | POA: Diagnosis not present

## 2014-08-26 DIAGNOSIS — R5382 Chronic fatigue, unspecified: Secondary | ICD-10-CM | POA: Diagnosis not present

## 2014-08-26 DIAGNOSIS — I10 Essential (primary) hypertension: Secondary | ICD-10-CM

## 2014-08-26 LAB — BASIC METABOLIC PANEL
BUN: 12 mg/dL (ref 6–23)
CHLORIDE: 104 meq/L (ref 96–112)
CO2: 29 meq/L (ref 19–32)
Calcium: 9 mg/dL (ref 8.4–10.5)
Creatinine, Ser: 0.85 mg/dL (ref 0.40–1.20)
GFR: 71.22 mL/min (ref >60.00)
Glucose, Bld: 116 mg/dL — ABNORMAL HIGH (ref 70–99)
POTASSIUM: 3.7 meq/L (ref 3.5–5.1)
Sodium: 139 mEq/L (ref 135–145)

## 2014-08-26 LAB — CBC
HCT: 44.1 % (ref 36.0–46.0)
Hemoglobin: 14.9 g/dL (ref 12.0–15.0)
MCHC: 33.8 g/dL (ref 30.0–36.0)
MCV: 91.3 fl (ref 78.0–100.0)
PLATELETS: 298 10*3/uL (ref 150.0–400.0)
RBC: 4.84 Mil/uL (ref 3.87–5.11)
RDW: 14.8 % (ref 11.5–15.5)
WBC: 9.4 10*3/uL (ref 4.0–10.5)

## 2014-08-26 LAB — TSH: TSH: 1.64 u[IU]/mL (ref 0.35–4.50)

## 2014-08-26 NOTE — Patient Instructions (Addendum)
Medication Instructions:   Your physician recommends that you continue on your current medications as directed. Please refer to the Current Medication list given to you today.   Labwork:  CBC BMET AND TSH   Testing/Procedures:  Your physician has recommended that you have a sleep study. This test records several body functions during sleep, including: brain activity, eye movement, oxygen and carbon dioxide blood levels, heart rate and rhythm, breathing rate and rhythm, the flow of air through your mouth and nose, snoring, body muscle movements, and chest and belly movement.    Follow-Up:                     WITH DR Rayann Heman IN 6 MONTHS RECALL PUT IN SYSTEM                      WITH DR Angelena Form  IN 3 MONTHS   Any Other Special Instructions Will Be Listed Below (If Applicable).

## 2014-08-27 ENCOUNTER — Ambulatory Visit (HOSPITAL_BASED_OUTPATIENT_CLINIC_OR_DEPARTMENT_OTHER): Payer: Medicare Other

## 2014-09-08 ENCOUNTER — Other Ambulatory Visit (INDEPENDENT_AMBULATORY_CARE_PROVIDER_SITE_OTHER): Payer: Medicare Other

## 2014-09-08 ENCOUNTER — Ambulatory Visit (INDEPENDENT_AMBULATORY_CARE_PROVIDER_SITE_OTHER): Payer: Medicare Other | Admitting: Family Medicine

## 2014-09-08 ENCOUNTER — Encounter: Payer: Self-pay | Admitting: Family Medicine

## 2014-09-08 VITALS — BP 132/74 | HR 71 | Temp 98.4°F | Wt 162.9 lb

## 2014-09-08 DIAGNOSIS — E785 Hyperlipidemia, unspecified: Secondary | ICD-10-CM

## 2014-09-08 DIAGNOSIS — Z0000 Encounter for general adult medical examination without abnormal findings: Secondary | ICD-10-CM | POA: Diagnosis not present

## 2014-09-08 DIAGNOSIS — R5382 Chronic fatigue, unspecified: Secondary | ICD-10-CM | POA: Diagnosis not present

## 2014-09-08 DIAGNOSIS — R0602 Shortness of breath: Secondary | ICD-10-CM

## 2014-09-08 DIAGNOSIS — I10 Essential (primary) hypertension: Secondary | ICD-10-CM | POA: Diagnosis not present

## 2014-09-08 LAB — POCT URINALYSIS DIPSTICK
Bilirubin, UA: NEGATIVE
GLUCOSE UA: NEGATIVE
Ketones, UA: NEGATIVE
Nitrite, UA: NEGATIVE
Protein, UA: NEGATIVE
RBC UA: NEGATIVE
Spec Grav, UA: 1.015
Urobilinogen, UA: 0.2
pH, UA: 6

## 2014-09-08 LAB — CBC WITH DIFFERENTIAL/PLATELET
BASOS PCT: 0.7 % (ref 0.0–3.0)
Basophils Absolute: 0.1 10*3/uL (ref 0.0–0.1)
Eosinophils Absolute: 0.2 10*3/uL (ref 0.0–0.7)
Eosinophils Relative: 2.4 % (ref 0.0–5.0)
HCT: 42.5 % (ref 36.0–46.0)
HEMOGLOBIN: 14.1 g/dL (ref 12.0–15.0)
Lymphocytes Relative: 49.7 % — ABNORMAL HIGH (ref 12.0–46.0)
Lymphs Abs: 4 10*3/uL (ref 0.7–4.0)
MCHC: 33.3 g/dL (ref 30.0–36.0)
MCV: 93.1 fl (ref 78.0–100.0)
MONO ABS: 0.5 10*3/uL (ref 0.1–1.0)
Monocytes Relative: 6.1 % (ref 3.0–12.0)
NEUTROS PCT: 41.1 % — AB (ref 43.0–77.0)
Neutro Abs: 3.3 10*3/uL (ref 1.4–7.7)
Platelets: 251 10*3/uL (ref 150.0–400.0)
RBC: 4.57 Mil/uL (ref 3.87–5.11)
RDW: 14.7 % (ref 11.5–15.5)
WBC: 8 10*3/uL (ref 4.0–10.5)

## 2014-09-08 LAB — BASIC METABOLIC PANEL
BUN: 15 mg/dL (ref 6–23)
CALCIUM: 9 mg/dL (ref 8.4–10.5)
CHLORIDE: 105 meq/L (ref 96–112)
CO2: 28 mEq/L (ref 19–32)
CREATININE: 0.96 mg/dL (ref 0.40–1.20)
GFR: 61.88 mL/min (ref >60.00)
GLUCOSE: 88 mg/dL (ref 70–99)
POTASSIUM: 4.3 meq/L (ref 3.5–5.1)
Sodium: 139 mEq/L (ref 135–145)

## 2014-09-08 LAB — HEPATIC FUNCTION PANEL
ALT: 6 U/L (ref 0–35)
AST: 12 U/L (ref 0–37)
Albumin: 4.1 g/dL (ref 3.5–5.2)
Alkaline Phosphatase: 49 U/L (ref 39–117)
Bilirubin, Direct: 0.1 mg/dL (ref 0.0–0.3)
Total Bilirubin: 0.4 mg/dL (ref 0.2–1.2)
Total Protein: 6.9 g/dL (ref 6.0–8.3)

## 2014-09-08 LAB — LIPID PANEL
Cholesterol: 134 mg/dL (ref 0–200)
HDL: 49.7 mg/dL (ref >39.00)
LDL CALC: 65 mg/dL (ref 0–99)
NonHDL: 84.3
Total CHOL/HDL Ratio: 3
Triglycerides: 96 mg/dL (ref 0.0–149.0)
VLDL: 19.2 mg/dL (ref 0.0–40.0)

## 2014-09-08 LAB — TSH: TSH: 1.34 u[IU]/mL (ref 0.35–4.50)

## 2014-09-08 MED ORDER — HYDROCODONE-ACETAMINOPHEN 5-325 MG PO TABS: 1.0000 | ORAL_TABLET | Freq: Four times a day (QID) | ORAL | Status: DC | PRN

## 2014-09-08 NOTE — Progress Notes (Signed)
Pre visit review using our clinic review tool, if applicable. No additional management support is needed unless otherwise documented below in the visit note. 

## 2014-09-08 NOTE — Progress Notes (Signed)
   Subjective:    Patient ID: [name] [name], female    DOB: 1951-02-08, 64 y.o.   MRN: [id number]  HPI Here to discuss chronic fatigue. She feels tired all the time and often feels sleepy. She has trouble sleeping quite often but even if she does sleep well, this does not help. She as been seeing Cardiology a lot recently and they have actually set her up for a sleep study on 11-06-14. She used to snore a lot but now she does not know if she snores or not. She has some SOB on exertion and she does have an occasional dry cough. No chest pains. Her last CXR was in 2006. She continues to smoke daily. Her labs from this am are totally normal.    Review of Systems  Constitutional: Positive for fatigue.  Respiratory: Positive for cough and shortness of breath. Negative for chest tightness.   Cardiovascular: Negative.        Objective:   Physical Exam  Constitutional: She appears well-developed and well-nourished.  Neck: Neck supple. No thyromegaly present.  Cardiovascular: Normal rate, regular rhythm, normal heart sounds and intact distal pulses.   Pulmonary/Chest: Effort normal and breath sounds normal. No respiratory distress. She has no rales.  Lymphadenopathy:    She has no cervical adenopathy.          Assessment & Plan:  SOB probably of mixed etiology. She likely has some COPD so we will set up a CXR. Of course she needs to stop smoking. She may also have sleep apnea, so I encouraged her to participate in the sleep study.

## 2014-09-09 ENCOUNTER — Ambulatory Visit (INDEPENDENT_AMBULATORY_CARE_PROVIDER_SITE_OTHER)
Admission: RE | Admit: 2014-09-09 | Discharge: 2014-09-09 | Disposition: A | Discharge status: Home / Self Care | Payer: Medicare Other | Source: Ambulatory Visit | Attending: Family Medicine | Admitting: Family Medicine

## 2014-09-09 DIAGNOSIS — R0602 Shortness of breath: Secondary | ICD-10-CM

## 2014-09-09 DIAGNOSIS — R062 Wheezing: Secondary | ICD-10-CM | POA: Diagnosis not present

## 2014-09-16 ENCOUNTER — Ambulatory Visit (INDEPENDENT_AMBULATORY_CARE_PROVIDER_SITE_OTHER): Payer: Medicare Other | Admitting: Family Medicine

## 2014-09-16 ENCOUNTER — Encounter: Payer: Self-pay | Admitting: Family Medicine

## 2014-09-16 VITALS — BP 155/103 | HR 66 | Temp 98.6°F | Ht 61.0 in | Wt 162.0 lb

## 2014-09-16 DIAGNOSIS — Z0000 Encounter for general adult medical examination without abnormal findings: Secondary | ICD-10-CM | POA: Diagnosis not present

## 2014-09-16 DIAGNOSIS — G4733 Obstructive sleep apnea (adult) (pediatric): Secondary | ICD-10-CM

## 2014-09-16 DIAGNOSIS — J438 Other emphysema: Secondary | ICD-10-CM | POA: Diagnosis not present

## 2014-09-16 MED ORDER — LISINOPRIL 20 MG PO TABS: 20.0000 mg | ORAL_TABLET | Freq: Every day | ORAL | Status: DC

## 2014-09-16 MED ORDER — BUPROPION HCL ER (XL) 150 MG PO TB24: 150.0000 mg | ORAL_TABLET | Freq: Every day | ORAL | Status: DC

## 2014-09-16 NOTE — Progress Notes (Signed)
   Subjective:    Patient ID: [name] [name], female    DOB: [birth year], 64 y.o.   MRN: [id number]  HPI 64 yr old female for a well exam. At our last exam she described SOB which sounds clinically to be emphysema. Her CXR that day was clear. She is also scheduled for a split sleep study on 11-06-14. She still describes feeling tired most of the time. Also today she wants to be treated for depression again. She had taken Zoloft a few years ago for this but she says it never really helped much. She now descrinbes feeling sad or hopeless at times, of decreased interest in things, and of social isolation.    Review of Systems  Constitutional: Positive for fatigue. Negative for fever, chills, diaphoresis, activity change, appetite change and unexpected weight change.  HENT: Negative.   Eyes: Negative.   Respiratory: Negative.   Cardiovascular: Negative.   Gastrointestinal: Negative.   Genitourinary: Negative for dysuria, urgency, frequency, hematuria, flank pain, decreased urine volume, enuresis, difficulty urinating, pelvic pain and dyspareunia.  Musculoskeletal: Negative.   Skin: Negative.   Neurological: Negative.   Psychiatric/Behavioral: Positive for dysphoric mood and decreased concentration. Negative for suicidal ideas, hallucinations, behavioral problems, confusion, self-injury and agitation. The patient is nervous/anxious. The patient is not hyperactive.        Objective:   Physical Exam  Constitutional: She is oriented to person, place, and time. She appears well-developed and well-nourished. No distress.  HENT:  Head: Normocephalic and atraumatic.  Right Ear: External ear normal.  Left Ear: External ear normal.  Nose: Nose normal.  Mouth/Throat: Oropharynx is clear and moist. No oropharyngeal exudate.  Eyes: Conjunctivae and EOM are normal. Pupils are equal, round, and reactive to light. No scleral icterus.  Neck: Normal range of motion. Neck supple. No JVD present. No thyromegaly  present.  Cardiovascular: Normal rate, regular rhythm, normal heart sounds and intact distal pulses.  Exam reveals no gallop and no friction rub.   No murmur heard. Pulmonary/Chest: Effort normal and breath sounds normal. No respiratory distress. She has no wheezes. She has no rales. She exhibits no tenderness.  Abdominal: Soft. Bowel sounds are normal. She exhibits no distension and no mass. There is no tenderness. There is no rebound and no guarding.  Musculoskeletal: Normal range of motion. She exhibits no edema or tenderness.  Lymphadenopathy:    She has no cervical adenopathy.  Neurological: She is alert and oriented to person, place, and time. She has normal reflexes. No cranial nerve deficit. She exhibits normal muscle tone. Coordination normal.  Skin: Skin is warm and dry. No rash noted. No erythema.  Psychiatric: She has a normal mood and affect. Her behavior is normal. Judgment and thought content normal.          Assessment & Plan:  We will increase the Lisinopril to 20 mg daily top get te BP down. Refer to Pulmonary to evaluate SOB and probable COPD. We will let Pulmonary evaluate possible sleep apnea as well. Start on Wellbutrin XL 150 mg daily for the depression. Recheck in one month

## 2014-09-16 NOTE — Progress Notes (Signed)
Pre visit review using our clinic review tool, if applicable. No additional management support is needed unless otherwise documented below in the visit note. 

## 2014-09-30 ENCOUNTER — Encounter: Payer: Self-pay | Admitting: Family Medicine

## 2014-09-30 ENCOUNTER — Ambulatory Visit (INDEPENDENT_AMBULATORY_CARE_PROVIDER_SITE_OTHER): Payer: Medicare Other | Admitting: Family Medicine

## 2014-09-30 VITALS — BP 144/100 | HR 65 | Temp 99.0°F | Ht 61.0 in | Wt 158.0 lb

## 2014-09-30 DIAGNOSIS — R1013 Epigastric pain: Secondary | ICD-10-CM | POA: Diagnosis not present

## 2014-09-30 MED ORDER — PANTOPRAZOLE SODIUM 40 MG PO TBEC: 40.0000 mg | DELAYED_RELEASE_TABLET | Freq: Two times a day (BID) | ORAL | Status: DC

## 2014-09-30 NOTE — Progress Notes (Signed)
   Subjective:    Patient ID: [name] [name], female    DOB: 1950-11-13, 64 y.o.   MRN: [id number]  HPI  Here for 4 days of epigastric pains which are constant but which wax and wane. They have a dull acing quality. They sometimes radiate around the right side to the back. No cough or SOB. No nausea or vomiting. No fever. Her BMs are normal. This reminds her of what she felt like when she had a duodenal ulcer. She had been off PPIs for several years but this week she has taken a few Zantac OTC with no benefit.   Review of Systems  Constitutional: Negative.   Respiratory: Negative.   Cardiovascular: Negative.   Gastrointestinal: Positive for abdominal pain. Negative for nausea, vomiting, diarrhea, constipation, blood in stool, abdominal distention, anal bleeding and rectal pain.  Genitourinary: Negative.        Objective:   Physical Exam  Constitutional: She appears well-developed and well-nourished. No distress.  Neck: Neck supple. No thyromegaly present.  Cardiovascular: Normal rate, regular rhythm, normal heart sounds and intact distal pulses.   Pulmonary/Chest: Effort normal and breath sounds normal.  Abdominal: Soft. Bowel sounds are normal. She exhibits no distension and no mass. There is no rebound and no guarding.  Mildly tender in the epigastrium and RUQ  Lymphadenopathy:    She has no cervical adenopathy.          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 We will get her back on Protonix 40 mg bid. Set up an [place] soon to rule out gall bladder disease.

## 2014-09-30 NOTE — Progress Notes (Signed)
Pre visit review using our clinic review tool, if applicable. No additional management support is needed unless otherwise documented below in the visit note. 

## 2014-10-02 ENCOUNTER — Ambulatory Visit
Admission: RE | Admit: 2014-10-02 | Discharge: 2014-10-02 | Disposition: A | Discharge status: Home / Self Care | Payer: Medicare Other | Source: Ambulatory Visit | Attending: Family Medicine | Admitting: Family Medicine

## 2014-10-02 ENCOUNTER — Telehealth: Payer: Self-pay | Admitting: Family Medicine

## 2014-10-02 DIAGNOSIS — N281 Cyst of kidney, acquired: Secondary | ICD-10-CM | POA: Diagnosis not present

## 2014-10-02 DIAGNOSIS — K828 Other specified diseases of gallbladder: Secondary | ICD-10-CM | POA: Diagnosis not present

## 2014-10-02 DIAGNOSIS — R1013 Epigastric pain: Secondary | ICD-10-CM

## 2014-10-02 MED ORDER — TRAMADOL HCL 50 MG PO TABS: 50.0000 mg | ORAL_TABLET | Freq: Four times a day (QID) | ORAL | Status: DC | PRN

## 2014-10-02 NOTE — Telephone Encounter (Signed)
Pt has gallstones and having pain. General surgeon can not see her until 7/14. I transferred pt to [place] office and ask her to ask their office can she be put on cancellation list. [facility] [address]

## 2014-10-02 NOTE — Telephone Encounter (Signed)
I spoke with pt and went over results. 

## 2014-10-02 NOTE — Telephone Encounter (Signed)
I spoke with pt and called in script.

## 2014-10-02 NOTE — Telephone Encounter (Signed)
Pt is on hydrocodone and that is not helping the pain. Pt takes med for back pain.

## 2014-10-02 NOTE — Addendum Note (Signed)
Addended by: Alysia Penna A on: 10/02/2014 01:55 PM   Modules accepted: Orders

## 2014-10-02 NOTE — Telephone Encounter (Signed)
[facility] called with results and pt would like instructions for the weekend.

## 2014-10-02 NOTE — Telephone Encounter (Signed)
See the result note

## 2014-10-02 NOTE — Telephone Encounter (Signed)
Call in Tramadol 50 mg to take 1-2 tabs every 6 hours prn pain, #60 with one rf

## 2014-10-06 ENCOUNTER — Other Ambulatory Visit: Payer: Self-pay | Admitting: Family Medicine

## 2014-10-07 ENCOUNTER — Telehealth: Payer: Self-pay | Admitting: Family Medicine

## 2014-10-07 ENCOUNTER — Emergency Department (HOSPITAL_COMMUNITY): Payer: Medicare Other

## 2014-10-07 ENCOUNTER — Emergency Department (HOSPITAL_COMMUNITY)
Admission: EM | Admit: 2014-10-07 | Discharge: 2014-10-07 | Disposition: A | Discharge status: Home / Self Care | Payer: Medicare Other | Attending: Emergency Medicine | Admitting: Emergency Medicine

## 2014-10-07 ENCOUNTER — Encounter (HOSPITAL_COMMUNITY): Payer: Self-pay | Admitting: Emergency Medicine

## 2014-10-07 DIAGNOSIS — Z79899 Other long term (current) drug therapy: Secondary | ICD-10-CM | POA: Insufficient documentation

## 2014-10-07 DIAGNOSIS — K5900 Constipation, unspecified: Secondary | ICD-10-CM | POA: Diagnosis not present

## 2014-10-07 DIAGNOSIS — R110 Nausea: Secondary | ICD-10-CM | POA: Insufficient documentation

## 2014-10-07 DIAGNOSIS — I10 Essential (primary) hypertension: Secondary | ICD-10-CM | POA: Insufficient documentation

## 2014-10-07 DIAGNOSIS — Z8669 Personal history of other diseases of the nervous system and sense organs: Secondary | ICD-10-CM | POA: Insufficient documentation

## 2014-10-07 DIAGNOSIS — M1990 Unspecified osteoarthritis, unspecified site: Secondary | ICD-10-CM | POA: Insufficient documentation

## 2014-10-07 DIAGNOSIS — R1011 Right upper quadrant pain: Secondary | ICD-10-CM | POA: Diagnosis not present

## 2014-10-07 DIAGNOSIS — Z87448 Personal history of other diseases of urinary system: Secondary | ICD-10-CM | POA: Insufficient documentation

## 2014-10-07 DIAGNOSIS — F419 Anxiety disorder, unspecified: Secondary | ICD-10-CM | POA: Insufficient documentation

## 2014-10-07 DIAGNOSIS — R112 Nausea with vomiting, unspecified: Secondary | ICD-10-CM | POA: Diagnosis not present

## 2014-10-07 DIAGNOSIS — K219 Gastro-esophageal reflux disease without esophagitis: Secondary | ICD-10-CM | POA: Insufficient documentation

## 2014-10-07 DIAGNOSIS — F329 Major depressive disorder, single episode, unspecified: Secondary | ICD-10-CM | POA: Diagnosis not present

## 2014-10-07 DIAGNOSIS — R630 Anorexia: Secondary | ICD-10-CM | POA: Insufficient documentation

## 2014-10-07 DIAGNOSIS — Z720 Tobacco use: Secondary | ICD-10-CM | POA: Diagnosis not present

## 2014-10-07 DIAGNOSIS — R109 Unspecified abdominal pain: Secondary | ICD-10-CM | POA: Diagnosis present

## 2014-10-07 DIAGNOSIS — Z87828 Personal history of other (healed) physical injury and trauma: Secondary | ICD-10-CM | POA: Diagnosis not present

## 2014-10-07 DIAGNOSIS — R1013 Epigastric pain: Secondary | ICD-10-CM

## 2014-10-07 DIAGNOSIS — R1084 Generalized abdominal pain: Secondary | ICD-10-CM | POA: Diagnosis not present

## 2014-10-07 DIAGNOSIS — E785 Hyperlipidemia, unspecified: Secondary | ICD-10-CM | POA: Insufficient documentation

## 2014-10-07 LAB — COMPREHENSIVE METABOLIC PANEL
ALBUMIN: 4.1 g/dL (ref 3.5–5.0)
ALT: 10 U/L — ABNORMAL LOW (ref 14–54)
AST: 16 U/L (ref 15–41)
Alkaline Phosphatase: 54 U/L (ref 38–126)
Anion gap: 10 (ref 5–15)
BILIRUBIN TOTAL: 0.8 mg/dL (ref 0.3–1.2)
BUN: 13 mg/dL (ref 6–20)
CHLORIDE: 104 mmol/L (ref 101–111)
CO2: 24 mmol/L (ref 22–32)
Calcium: 9.2 mg/dL (ref 8.9–10.3)
Creatinine, Ser: 0.92 mg/dL (ref 0.44–1.00)
GFR calc Af Amer: >60 mL/min (ref >60)
GFR calc non Af Amer: >60 mL/min (ref >60)
Glucose, Bld: 113 mg/dL — ABNORMAL HIGH (ref 65–99)
Potassium: 3.7 mmol/L (ref 3.5–5.1)
Sodium: 138 mmol/L (ref 135–145)
Total Protein: 7.4 g/dL (ref 6.5–8.1)

## 2014-10-07 LAB — CBC WITH DIFFERENTIAL/PLATELET
BASOS ABS: 0.1 10*3/uL (ref 0.0–0.1)
Basophils Relative: 1 % (ref 0–1)
EOS PCT: 1 % (ref 0–5)
Eosinophils Absolute: 0.1 10*3/uL (ref 0.0–0.7)
HCT: 46.2 % — ABNORMAL HIGH (ref 36.0–46.0)
Hemoglobin: 15.4 g/dL — ABNORMAL HIGH (ref 12.0–15.0)
Lymphocytes Relative: 50 % — ABNORMAL HIGH (ref 12–46)
Lymphs Abs: 4.7 10*3/uL — ABNORMAL HIGH (ref 0.7–4.0)
MCH: 31.2 pg (ref 26.0–34.0)
MCHC: 33.3 g/dL (ref 30.0–36.0)
MCV: 93.5 fL (ref 78.0–100.0)
Monocytes Absolute: 0.4 10*3/uL (ref 0.1–1.0)
Monocytes Relative: 4 % (ref 3–12)
NEUTROS ABS: 4.2 10*3/uL (ref 1.7–7.7)
NEUTROS PCT: 44 % (ref 43–77)
Platelets: 283 10*3/uL (ref 150–400)
RBC: 4.94 MIL/uL (ref 3.87–5.11)
RDW: 13.6 % (ref 11.5–15.5)
WBC: 9.5 10*3/uL (ref 4.0–10.5)

## 2014-10-07 LAB — URINALYSIS, ROUTINE W REFLEX MICROSCOPIC
Bilirubin Urine: NEGATIVE
GLUCOSE, UA: NEGATIVE mg/dL
Hgb urine dipstick: NEGATIVE
KETONES UR: NEGATIVE mg/dL
Leukocytes, UA: NEGATIVE
Nitrite: NEGATIVE
PH: 6.5 (ref 5.0–8.0)
Protein, ur: NEGATIVE mg/dL
Specific Gravity, Urine: 1.007 (ref 1.005–1.030)
Urobilinogen, UA: 0.2 mg/dL (ref 0.0–1.0)

## 2014-10-07 LAB — LIPASE, BLOOD: Lipase: 17 U/L — ABNORMAL LOW (ref 22–51)

## 2014-10-07 MED ORDER — TECHNETIUM TC 99M MEBROFENIN IV KIT
5.0000 | PACK | Freq: Once | INTRAVENOUS | Status: AC | PRN
  Administered 2014-10-07: 5.4 via INTRAVENOUS

## 2014-10-07 MED ORDER — ONDANSETRON HCL 4 MG/2ML IJ SOLN
4.0000 mg | Freq: Once | INTRAMUSCULAR | Status: AC
  Administered 2014-10-07: 4 mg via INTRAVENOUS
  Filled 2014-10-07: qty 2

## 2014-10-07 MED ORDER — FENTANYL CITRATE (PF) 100 MCG/2ML IJ SOLN
75.0000 ug | Freq: Once | INTRAMUSCULAR | Status: AC
  Administered 2014-10-07: 75 ug via INTRAVENOUS
  Filled 2014-10-07: qty 2

## 2014-10-07 MED ORDER — KCL IN DEXTROSE-NACL 20-5-0.45 MEQ/L-%-% IV SOLN
INTRAVENOUS | Status: DC
  Administered 2014-10-07: 17:00:00 via INTRAVENOUS
  Filled 2014-10-07: qty 1000

## 2014-10-07 NOTE — Discharge Instructions (Signed)

## 2014-10-07 NOTE — Telephone Encounter (Signed)
Let's see what happens in the ER today

## 2014-10-07 NOTE — Telephone Encounter (Signed)
Looks like pt went to ER today.

## 2014-10-07 NOTE — Consult Note (Signed)
Reason for Consult: RUQ abdominal pain Referring Physician: Dr. Marye Round   HPI: [name] [name] is a 64 year old female with a history of tobacco use, anxiety/depression, PVCs, moderate CAD per left heart catheterization in January of 2016 by Dr. Angelena Form managed medically, GERD, remote duodenal ulcer as an adolescent, HTN, HLD presenting with abdominal pain.  Duration of symptoms is 9 days.  Onset was sudden.  Coarse is unchanged.  Mild to moderate in severity.  Time pattern is constant.  No aggravating or alleviating factors.  Modifying factors include; tramadol and hydrocodone which has not helped much.  It is not associated by oral intake.  She had a egg/sausage biscuit this morning and has not had worsening symptoms.  The patient also reports 59lb weight loss in the past year.  Last colonoscopy was 10/2013 which showed polyps. She has been smoking for 53 years and complains of fatigue, shortness of breath and chest pains with ADLs.  She was seen by her pcp and started on Protonix for suspicion of ulcer and also referred for an abdominal US which showed sludge.  She was due to see Dr. Arnoldo Morale on the 14th.  She was apparently told by surgeons office to come to the ED if symptoms persist.    LFTs are normal, normal lipase and normal white count.    Denies NSAID use.   Past Medical History  Diagnosis Date  . Depression   . GERD (gastroesophageal reflux disease)   . Osteoarthritis   . Allergic rhinitis   . Headache(784.0)   . Hyperlipidemia   . Hypertension   . Insomnia   . Overactive bladder   . Dexamethasone adverse reaction     2002 normal  . Allergy     seasonal  . Anxiety   . Cataract   . Ulcer     duodenal  . Premature ventricular contractions     LBBB inferior axis PVCs  . Tobacco abuse     Past Surgical History  Procedure Laterality Date  . Tonsillectomy    . Carpal tunnel release    . Torn cartilage repaired rt wrist  2003  . Colonoscopy  10-17-13    per Dr. Olevia Perches,  benign polyps, repeat in 10 years  . Left and right heart catheterization with coronary angiogram N/A 04/22/2014    Procedure: LEFT AND RIGHT HEART CATHETERIZATION WITH CORONARY ANGIOGRAM;  Surgeon: Burnell Blanks, MD;  Location: [facility] CATH LAB;  Service: Cardiovascular;  Laterality: N/A;    Family History  Problem Relation Age of Onset  . Hyperlipidemia    . Hypertension    . Kidney disease    . CAD Brother   . Kidney disease Brother   . Emphysema Mother   . Colon cancer Neg Hx   . Heart disease    . Other Father     brain tumor  . Throat cancer Brother     Social History:  reports that she has been smoking Cigarettes.  She has a 39 pack-year smoking history. She has never used smokeless tobacco. She reports that she does not drink alcohol or use illicit drugs.  Allergies:  Allergies  Allergen Reactions  . Dexamethasone Other (See Comments)    Pt thinks they used it during surgery and she quit breathing, not sure this was the exact medication.  . Oxycodone-Acetaminophen     REACTION: n/v    Medications:  Prior to Admission medications   Medication Sig Start Date End Date Taking? Authorizing Provider  albuterol (PROVENTIL HFA;VENTOLIN HFA) 108 (90 BASE) MCG/ACT inhaler Inhale 2 puffs into the lungs every 6 (six) hours as needed for wheezing. 05/30/14 05/30/15 Yes Midge Minium, MD  ALPRAZolam Duanne Moron) 0.25 MG tablet Take 3 tablets (0.75 mg total) by mouth every 6 (six) hours as needed for anxiety. 05/25/14  Yes [name], MD  buPROPion (WELLBUTRIN XL) 150 MG 24 hr tablet Take 1 tablet (150 mg total) by mouth daily. 09/16/14  Yes [name], MD  cetirizine (ZYRTEC) 10 MG tablet Take 10 mg by mouth daily as needed for allergies.    Yes Historical Provider, MD  flecainide (TAMBOCOR) 50 MG tablet Take 1 tablet (50 mg total) by mouth 2 (two) times daily. 07/29/14  Yes Thompson Grayer, MD  HYDROcodone-acetaminophen (NORCO/VICODIN) 5-325 MG per tablet Take 1 tablet by mouth  every 6 (six) hours as needed for moderate pain. 09/08/14  Yes [name], MD  ibuprofen (ADVIL,MOTRIN) 200 MG tablet Take 400 mg by mouth every 6 (six) hours as needed for moderate pain.    Yes Historical Provider, MD  lisinopril (PRINIVIL,ZESTRIL) 20 MG tablet Take 1 tablet (20 mg total) by mouth daily. 09/16/14  Yes [name], MD  metoprolol (LOPRESSOR) 50 MG tablet Take 1 tablet (50 mg total) by mouth 2 (two) times daily. 09/30/13  Yes Burnell Blanks, MD  pantoprazole (PROTONIX) 40 MG tablet Take 1 tablet (40 mg total) by mouth 2 (two) times daily. 09/30/14  Yes [name], MD  atorvastatin (LIPITOR) 20 MG tablet TAKE 1 TABLET BY MOUTH EVERY DAY 10/07/14   [name], MD  traMADol (ULTRAM) 50 MG tablet Take 1-2 tablets (50-100 mg total) by mouth every 6 (six) hours as needed. Patient not taking: Reported on 10/07/2014 10/02/14   [name], MD     Results for orders placed or performed during the hospital encounter of 10/07/14 (from the past 48 hour(s))  CBC with Differential     Status: Abnormal   Collection Time: 10/07/14  1:38 PM  Result Value Ref Range   WBC 9.5 4.0 - 10.5 K/uL   RBC 4.94 3.87 - 5.11 MIL/uL   Hemoglobin 15.4 (H) 12.0 - 15.0 g/dL   HCT 46.2 (H) 36.0 - 46.0 %   MCV 93.5 78.0 - 100.0 fL   MCH 31.2 26.0 - 34.0 pg   MCHC 33.3 30.0 - 36.0 g/dL   RDW 13.6 11.5 - 15.5 %   Platelets 283 150 - 400 K/uL   Neutrophils Relative % 44 43 - 77 %   Neutro Abs 4.2 1.7 - 7.7 K/uL   Lymphocytes Relative 50 (H) 12 - 46 %   Lymphs Abs 4.7 (H) 0.7 - 4.0 K/uL   Monocytes Relative 4 3 - 12 %   Monocytes Absolute 0.4 0.1 - 1.0 K/uL   Eosinophils Relative 1 0 - 5 %   Eosinophils Absolute 0.1 0.0 - 0.7 K/uL   Basophils Relative 1 0 - 1 %   Basophils Absolute 0.1 0.0 - 0.1 K/uL  Comprehensive metabolic panel     Status: Abnormal   Collection Time: 10/07/14  1:38 PM  Result Value Ref Range   Sodium 138 135 - 145 mmol/L   Potassium 3.7 3.5 - 5.1 mmol/L   Chloride 104 101  - 111 mmol/L   CO2 24 22 - 32 mmol/L   Glucose, Bld 113 (H) 65 - 99 mg/dL   BUN 13 6 - 20 mg/dL   Creatinine,  Ser 0.92 0.44 - 1.00 mg/dL   Calcium 9.2 8.9 - 10.3 mg/dL   Total Protein 7.4 6.5 - 8.1 g/dL   Albumin 4.1 3.5 - 5.0 g/dL   AST 16 15 - 41 U/L   ALT 10 (L) 14 - 54 U/L   Alkaline Phosphatase 54 38 - 126 U/L   Total Bilirubin 0.8 0.3 - 1.2 mg/dL   GFR calc non Af Amer >60 >60 mL/min   GFR calc Af Amer >60 >60 mL/min    Comment: (NOTE) The eGFR has been calculated using the CKD EPI equation. This calculation has not been validated in all clinical situations. eGFR's persistently <60 mL/min signify possible Chronic Kidney Disease.    Anion gap 10 5 - 15  Lipase, blood     Status: Abnormal   Collection Time: 10/07/14  1:38 PM  Result Value Ref Range   Lipase 17 (L) 22 - 51 U/L    No results found.  Review of Systems  Constitutional: Positive for fever, chills, weight loss, malaise/fatigue and diaphoresis.  Eyes: Negative.   Respiratory: Positive for shortness of breath. Negative for cough, hemoptysis and wheezing.   Cardiovascular: Positive for chest pain and palpitations. Negative for leg swelling.  Gastrointestinal: Positive for nausea and abdominal pain. Negative for heartburn, vomiting, diarrhea, constipation, blood in stool and melena.  Genitourinary: Negative for dysuria, urgency, frequency, hematuria and flank pain.  Musculoskeletal: Positive for myalgias and back pain.  Neurological: Positive for weakness and headaches. Negative for dizziness, tingling, sensory change, speech change, focal weakness, seizures and loss of consciousness.  Psychiatric/Behavioral: Positive for depression. Negative for suicidal ideas, hallucinations, memory loss and substance abuse. The patient is nervous/anxious. The patient does not have insomnia.    Blood pressure 159/102, pulse 65, temperature 98.1 F (36.7 C), temperature source Oral, resp. rate 16, SpO2 97 %. Physical Exam   Constitutional: She is oriented to person, place, and time. She appears well-developed and well-nourished. No distress.  Cardiovascular: Normal rate, regular rhythm, normal heart sounds and intact distal pulses.  Exam reveals no gallop and no friction rub.   No murmur heard. Respiratory: Effort normal and breath sounds normal. No respiratory distress. She has no wheezes. She has no rales. She exhibits no tenderness.  GI: Soft. Bowel sounds are normal. She exhibits no distension and no mass. There is no tenderness. There is no rebound and no guarding.  Musculoskeletal: Normal range of motion. She exhibits no edema or tenderness.  Neurological: She is alert and oriented to person, place, and time.  Skin: Skin is warm and dry. No rash noted. She is not diaphoretic. No erythema. No pallor.  Psychiatric: She has a normal mood and affect. Her behavior is normal. Judgment and thought content normal.    Assessment/Plan: Abdominal pain/nausea Weight loss Gallbladder sludge CAD PVCs Tobacco use HTN HLD  No acute surgical issues.  Symptoms are atypical for biliary disease.  Will proceed with a HIDA scan for further evaluation.  If positive, we will re-evaluate the patient and consider surgical intervention with cardiac evaluation before.  If HIDA is negative, she will need further evaluation ie CT and/or EGD per primary team.  Would keep her NPO.  Unfortunately, she had fentanyl and HIDA scan will need to be delayed, would hold off on additional narcotics until HIDA completed. Thank you for this interesting consult.    Fabiana Dromgoole ANP-BC 10/07/2014, 3:15 PM

## 2014-10-07 NOTE — ED Provider Notes (Signed)
Patient's HIDA scan is negative. Per prior plan with surgery, Dr. Dalbert Batman and Dr. Tomi Bamberger, patient is to be discharged home with f/u with PCP and surgery. Will give GI referral.  Results for orders placed or performed during the hospital encounter of 10/07/14  CBC with Differential  Result Value Ref Range   WBC 9.5 4.0 - 10.5 K/uL   RBC 4.94 3.87 - 5.11 MIL/uL   Hemoglobin 15.4 (H) 12.0 - 15.0 g/dL   HCT 46.2 (H) 36.0 - 46.0 %   MCV 93.5 78.0 - 100.0 fL   MCH 31.2 26.0 - 34.0 pg   MCHC 33.3 30.0 - 36.0 g/dL   RDW 13.6 11.5 - 15.5 %   Platelets 283 150 - 400 K/uL   Neutrophils Relative % 44 43 - 77 %   Neutro Abs 4.2 1.7 - 7.7 K/uL   Lymphocytes Relative 50 (H) 12 - 46 %   Lymphs Abs 4.7 (H) 0.7 - 4.0 K/uL   Monocytes Relative 4 3 - 12 %   Monocytes Absolute 0.4 0.1 - 1.0 K/uL   Eosinophils Relative 1 0 - 5 %   Eosinophils Absolute 0.1 0.0 - 0.7 K/uL   Basophils Relative 1 0 - 1 %   Basophils Absolute 0.1 0.0 - 0.1 K/uL  Comprehensive metabolic panel  Result Value Ref Range   Sodium 138 135 - 145 mmol/L   Potassium 3.7 3.5 - 5.1 mmol/L   Chloride 104 101 - 111 mmol/L   CO2 24 22 - 32 mmol/L   Glucose, Bld 113 (H) 65 - 99 mg/dL   BUN 13 6 - 20 mg/dL   Creatinine, Ser 0.92 0.44 - 1.00 mg/dL   Calcium 9.2 8.9 - 10.3 mg/dL   Total Protein 7.4 6.5 - 8.1 g/dL   Albumin 4.1 3.5 - 5.0 g/dL   AST 16 15 - 41 U/L   ALT 10 (L) 14 - 54 U/L   Alkaline Phosphatase 54 38 - 126 U/L   Total Bilirubin 0.8 0.3 - 1.2 mg/dL   GFR calc non Af Amer >60 >60 mL/min   GFR calc Af Amer >60 >60 mL/min   Anion gap 10 5 - 15  Lipase, blood  Result Value Ref Range   Lipase 17 (L) 22 - 51 U/L  Urinalysis, Routine w reflex microscopic (not at [facility])  Result Value Ref Range   Color, Urine YELLOW YELLOW   APPearance CLEAR CLEAR   Specific Gravity, Urine 1.007 1.005 - 1.030   pH 6.5 5.0 - 8.0   Glucose, UA NEGATIVE NEGATIVE mg/dL   Hgb urine dipstick NEGATIVE NEGATIVE   Bilirubin Urine NEGATIVE NEGATIVE    Ketones, ur NEGATIVE NEGATIVE mg/dL   Protein, ur NEGATIVE NEGATIVE mg/dL   Urobilinogen, UA 0.2 0.0 - 1.0 mg/dL   Nitrite NEGATIVE NEGATIVE   Leukocytes, UA NEGATIVE NEGATIVE   Dg Chest 2 View  09/09/2014   CLINICAL DATA:  Wheezing.  EXAM: CHEST  2 VIEW  COMPARISON:  Chest x-ray 07/16/2013, 09/26/2004.  FINDINGS: Mediastinum hilar structures are normal. Stable basal pleural parenchymal thickening consistent scarring . Heart size normal. Thoracic spine scoliosis and degenerative change .  IMPRESSION: No acute cardiopulmonary disease .   Electronically Signed   By: Marcello Moores  Register   On: 09/09/2014 10:08   Nm Hepatobiliary Liver Func  10/07/2014   CLINICAL DATA:  Subacute onset of generalized abdominal pain, nausea and vomiting. Initial encounter.  EXAM: NUCLEAR MEDICINE HEPATOBILIARY IMAGING  TECHNIQUE: Sequential images of the abdomen were  obtained out to 45 minutes following intravenous administration of radiopharmaceutical.  RADIOPHARMACEUTICALS:  5.4 mCi Tc-42m  Choletec IV  COMPARISON:  Abdominal ultrasound performed 10/02/2014  FINDINGS: Images demonstrate filling of the common bile duct at 15 minutes following intravenous administration of radiopharmaceutical. There is opacification of the small bowel starting at 20 minutes, with opacification of the gallbladder seen at 35 minutes. No abnormal focal uptake or defect is visualized.  IMPRESSION: Normal HIDA scan.   Electronically Signed   By: Garald Balding M.D.   On: 10/07/2014 21:53   US Abdomen Complete  10/02/2014   CLINICAL DATA:  Six days of abdominal pain without nausea or vomiting ; history of duodenal ulcer  EXAM: ULTRASOUND ABDOMEN COMPLETE  COMPARISON:  Abdominal ultrasound of July 07, 2003  FINDINGS: Gallbladder: The gallbladder is adequately distended. There is mobile echogenic bile or sludge present. There is a non mobile 4 mm echogenic non shadowing focus adherent to the mucosa. There is no gallbladder wall thickening,  pericholecystic fluid, or reported positive Murphy's sign.  Common bile duct: Diameter: 3.2-4.3 mm  Liver: The liver exhibits normal echotexture with no focal mass or ductal dilation.  IVC: No abnormality visualized.  Pancreas: Evaluation of the pancreas is limited by bowel gas.  Spleen: Size and appearance within normal limits.  Right Kidney: Length: 9.4 cm. The renal cortical echotexture remains slightly lower than that of the liver. There is an upper pole cyst measuring 1.6 cm in greatest dimension. There is no hydronephrosis.  Left Kidney: Length: 10.3 cm. Echogenicity within normal limits. No mass or hydronephrosis visualized.  Abdominal aorta: No aneurysm visualized.  Other findings: No ascites is observed.  IMPRESSION: 1. There is gallbladder sludge and probable adherent non shadowing stone versus polyp. There is no sonographic evidence of acute cholecystitis. Further evaluation with nuclear medicine hepatobiliary scanning may be useful if gallbladder dysfunction is felt to be likely. 2. Limited pancreatic evaluation due to bowel gas. 3. Simple appearing cyst in the upper pole of the right kidney.   Electronically Signed   By: David  Martinique M.D.   On: 10/02/2014 11:01      Sherwood Gambler, MD 10/07/14 [id number]

## 2014-10-07 NOTE — ED Notes (Signed)
Pt transported to [facility] 

## 2014-10-07 NOTE — Telephone Encounter (Signed)
Pt has an appt to see surgeon on 10/15/14. Pt needs a work note to return to work on 10/16/14. Pt states tramdol is not working. Pt would like to take hydrocodone every 4 hrs instead of 6 hrs.

## 2014-10-07 NOTE — ED Notes (Addendum)
Pt c/o right abdominal pain onset a couple weeks ago, anorexia, nausea without emesis, and constipation with last BM 4 days ago. Pt states that her PCP found sludge in her gall bladder and also believes pt may have an ulcer in her abdomen.

## 2014-10-07 NOTE — ED Notes (Addendum)
Surgeon and EDP notified that pt cannot have NM scan as she has had opiates in the past 6 hours (fentanyl at 2:45pm).

## 2014-10-07 NOTE — ED Notes (Signed)
NP at bedside.

## 2014-10-07 NOTE — Telephone Encounter (Signed)
Is pt taking this medication?

## 2014-10-07 NOTE — ED Notes (Signed)
NM to send in an on call tech for NM scan at 8:45. Nothing to eat or drink or opiates until after that time. EDP to be notified.

## 2014-10-07 NOTE — ED Provider Notes (Signed)
CSN: [id number]     Arrival date & time 10/07/14  1302 History   First MD Initiated Contact with Patient 10/07/14 1307     No chief complaint on file.   Patient is a 64 y.o. female presenting with abdominal pain. The history is provided by the patient.  Abdominal Pain Pain location:  RUQ Pain radiates to:  Epigastric region Pain severity:  Severe Onset quality:  Gradual Duration:  1 week Timing:  Constant Associated symptoms: anorexia, constipation, fever (100.1) and nausea   Associated symptoms: no diarrhea, no dysuria and no vomiting   Pt saw her PCP and had an [place] that showed gallbladder sludge.  She was referred to surgery.  Her appointment is not until the 14th.  Her symptoms are not getting better.  She called the office and was told to come the ED.  Past Medical History  Diagnosis Date  . Depression   . GERD (gastroesophageal reflux disease)   . Osteoarthritis   . Allergic rhinitis   . Headache(784.0)   . Hyperlipidemia   . Hypertension   . Insomnia   . Overactive bladder   . Dexamethasone adverse reaction     2002 normal  . Allergy     seasonal  . Anxiety   . Cataract   . Ulcer     duodenal  . Premature ventricular contractions     LBBB inferior axis PVCs  . Tobacco abuse    Past Surgical History  Procedure Laterality Date  . Tonsillectomy    . Carpal tunnel release    . Torn cartilage repaired rt wrist  2003  . Colonoscopy  10-17-13    per Dr. Olevia Perches, benign polyps, repeat in 10 years  . Left and right heart catheterization with coronary angiogram N/A 04/22/2014    Procedure: LEFT AND RIGHT HEART CATHETERIZATION WITH CORONARY ANGIOGRAM;  Surgeon: Burnell Blanks, MD;  Location: [facility] CATH LAB;  Service: Cardiovascular;  Laterality: N/A;   Family History  Problem Relation Age of Onset  . Hyperlipidemia    . Hypertension    . Kidney disease    . CAD Brother   . Kidney disease Brother   . Emphysema Mother   . Colon cancer Neg Hx   . Heart disease    .  Other Father     brain tumor  . Throat cancer Brother    History  Substance Use Topics  . Smoking status: Current Every Day Smoker -- 0.75 packs/day for 52 years    Types: Cigarettes  . Smokeless tobacco: Never Used     Comment: 1/2 pack per day or less  . Alcohol Use: No   OB History    No data available     Review of Systems  Constitutional: Positive for fever (100.1).  Gastrointestinal: Positive for nausea, abdominal pain, constipation and anorexia. Negative for vomiting and diarrhea.  Genitourinary: Negative for dysuria.  All other systems reviewed and are negative.     Allergies  Dexamethasone and Oxycodone-acetaminophen  Home Medications   Prior to Admission medications   Medication Sig Start Date End Date Taking? Authorizing Provider  albuterol (PROVENTIL HFA;VENTOLIN HFA) 108 (90 BASE) MCG/ACT inhaler Inhale 2 puffs into the lungs every 6 (six) hours as needed for wheezing. 05/30/14 05/30/15 Yes Midge Minium, MD  ALPRAZolam Duanne Moron) 0.25 MG tablet Take 3 tablets (0.75 mg total) by mouth every 6 (six) hours as needed for anxiety. 05/25/14  Yes [name], MD  buPROPion (WELLBUTRIN XL) 150  MG 24 hr tablet Take 1 tablet (150 mg total) by mouth daily. 09/16/14  Yes [name], MD  cetirizine (ZYRTEC) 10 MG tablet Take 10 mg by mouth daily as needed for allergies.    Yes Historical Provider, MD  flecainide (TAMBOCOR) 50 MG tablet Take 1 tablet (50 mg total) by mouth 2 (two) times daily. 07/29/14  Yes Thompson Grayer, MD  HYDROcodone-acetaminophen (NORCO/VICODIN) 5-325 MG per tablet Take 1 tablet by mouth every 6 (six) hours as needed for moderate pain. 09/08/14  Yes [name], MD  ibuprofen (ADVIL,MOTRIN) 200 MG tablet Take 400 mg by mouth every 6 (six) hours as needed for moderate pain.    Yes Historical Provider, MD  lisinopril (PRINIVIL,ZESTRIL) 20 MG tablet Take 1 tablet (20 mg total) by mouth daily. 09/16/14  Yes [name], MD  metoprolol (LOPRESSOR) 50 MG  tablet Take 1 tablet (50 mg total) by mouth 2 (two) times daily. 09/30/13  Yes Burnell Blanks, MD  pantoprazole (PROTONIX) 40 MG tablet Take 1 tablet (40 mg total) by mouth 2 (two) times daily. 09/30/14  Yes [name], MD  atorvastatin (LIPITOR) 20 MG tablet TAKE 1 TABLET BY MOUTH EVERY DAY 10/07/14   [name], MD  traMADol (ULTRAM) 50 MG tablet Take 1-2 tablets (50-100 mg total) by mouth every 6 (six) hours as needed. Patient not taking: Reported on 10/07/2014 10/02/14   [name], MD   BP 159/102 mmHg  Pulse 65  Temp(Src) 98.1 F (36.7 C) (Oral)  Resp 16  SpO2 97% Physical Exam  Constitutional: She appears well-developed and well-nourished. No distress.  HENT:  Head: Normocephalic and atraumatic.  Right Ear: External ear normal.  Left Ear: External ear normal.  Eyes: Conjunctivae are normal. Right eye exhibits no discharge. Left eye exhibits no discharge. No scleral icterus.  Neck: Neck supple. No tracheal deviation present.  Cardiovascular: Normal rate, regular rhythm and intact distal pulses.   Pulmonary/Chest: Effort normal and breath sounds normal. No stridor. No respiratory distress. She has no wheezes. She has no rales.  Abdominal: Soft. Bowel sounds are normal. She exhibits no distension. There is tenderness in the right upper quadrant. There is no rigidity, no rebound, no guarding and negative Murphy's sign. No hernia.  Musculoskeletal: She exhibits no edema or tenderness.  Neurological: She is alert. She has normal strength. No cranial nerve deficit (no facial droop, extraocular movements intact, no slurred speech) or sensory deficit. She exhibits normal muscle tone. She displays no seizure activity. Coordination normal.  Skin: Skin is warm and dry. No rash noted.  Psychiatric: She has a normal mood and affect.  Nursing note and vitals reviewed.   ED Course  Procedures (including critical care time) Labs Review Labs Reviewed  CBC WITH DIFFERENTIAL/PLATELET -  Abnormal; Notable for the following:    Hemoglobin 15.4 (*)    HCT 46.2 (*)    Lymphocytes Relative 50 (*)    Lymphs Abs 4.7 (*)    All other components within normal limits  COMPREHENSIVE METABOLIC PANEL - Abnormal; Notable for the following:    Glucose, Bld 113 (*)    ALT 10 (*)    All other components within normal limits  LIPASE, BLOOD - Abnormal; Notable for the following:    Lipase 17 (*)    All other components within normal limits  URINALYSIS, ROUTINE W REFLEX MICROSCOPIC (NOT AT [facility])    Medications  ondansetron (ZOFRAN) injection 4 mg (4 mg Intravenous Given 10/07/14  1443)  fentaNYL (SUBLIMAZE) injection 75 mcg (75 mcg Intravenous Given 10/07/14 1443)     MDM   Final diagnoses:  Abdominal pain    Pt with ruq pain, gallbladder sludge noted on [place].  Possible GB etiology.  Discussed case with general surgery who will see patient in the ED.  HIDA scan ordered by general surgery.  Dispo pending.    Dorie Rank, MD 10/07/14 [phone]

## 2014-10-08 ENCOUNTER — Institutional Professional Consult (permissible substitution): Payer: Medicare Other | Admitting: Pulmonary Disease

## 2014-10-08 ENCOUNTER — Telehealth: Payer: Self-pay | Admitting: Internal Medicine

## 2014-10-08 NOTE — Telephone Encounter (Signed)
Patient scheduled on 10/13/14 at 2:00 PM with Dr. Olevia Perches.

## 2014-10-08 NOTE — Telephone Encounter (Signed)
The work note is okay to give her. I referred her to see Dr Olevia Perches ([facility])

## 2014-10-08 NOTE — Telephone Encounter (Signed)
I spoke with pt and wrote work note, okay per Dr. Sarajane Jews.

## 2014-10-08 NOTE — Telephone Encounter (Signed)
Pt had an appt for 1 month follow that I changed to post hosp  Follow up on 10-16-14. Pt still needs work note to return to work on 10-16-14. Pt stated er md would like her to see gi specialist. Pt does not want to go to [facility]	[place].

## 2014-10-08 NOTE — Telephone Encounter (Signed)
Left a message for patient to call back. 

## 2014-10-12 ENCOUNTER — Encounter: Payer: Self-pay | Admitting: Pulmonary Disease

## 2014-10-12 ENCOUNTER — Other Ambulatory Visit: Payer: Self-pay | Admitting: Cardiovascular Disease

## 2014-10-12 ENCOUNTER — Ambulatory Visit (INDEPENDENT_AMBULATORY_CARE_PROVIDER_SITE_OTHER): Payer: Medicare Other | Admitting: Pulmonary Disease

## 2014-10-12 VITALS — BP 130/80 | HR 62 | Temp 97.6°F | Ht 65.0 in | Wt 156.2 lb

## 2014-10-12 DIAGNOSIS — J4489 Other specified chronic obstructive pulmonary disease: Secondary | ICD-10-CM | POA: Insufficient documentation

## 2014-10-12 DIAGNOSIS — J449 Chronic obstructive pulmonary disease, unspecified: Secondary | ICD-10-CM | POA: Diagnosis not present

## 2014-10-12 DIAGNOSIS — R0600 Dyspnea, unspecified: Secondary | ICD-10-CM | POA: Diagnosis not present

## 2014-10-12 HISTORY — DX: Chronic obstructive pulmonary disease, unspecified: J44.9

## 2014-10-12 HISTORY — DX: Other specified chronic obstructive pulmonary disease: J44.89

## 2014-10-12 MED ORDER — BUDESONIDE-FORMOTEROL FUMARATE 160-4.5 MCG/ACT IN AERO: 2.0000 | INHALATION_SPRAY | Freq: Two times a day (BID) | RESPIRATORY_TRACT | Status: DC

## 2014-10-12 NOTE — Patient Instructions (Signed)
[name]- it was nice meeting you today...  To help with your Stage2 COPD>>     You MUST quit the smoking!!!    Start the new SYMBICORT inhaler- 2 sprays twice daily on a regular basis...    Continue your ALBUTEROL rescue inhaler as needed...   Proceed w/ the sleep study that has been arranged by your cardiologists  Call for any questions...  Let's plan a follow up visit in 6weeks, sooner if needed for problems.Marland KitchenMarland Kitchen

## 2014-10-12 NOTE — Progress Notes (Signed)
Subjective:     Patient ID: [name] [name], female   DOB: 1950/12/14, 64 y.o.   MRN: [id number]  HPI 64 y/o WF, referred by DrFry, for a pulmonary evaluation due to dyspnea>   [name] has been a heavy smoker, starting at age 48, smoking up to 2ppd transiently but mostly 1ppd in her adult life, and still smoking 1/2-1ppd at present, for a 55-60 pack-year smoking history;  Her CC is SOB/ DOE esp when walking stairs but also notes SOB w/ ADLs over the last yr or so; she as assoc cough but not much sput & no hemoptysis;  She has had occas bouts of bronchitis treated over the yrs w/ antibiotics etc, notes prev pneumonia x1, no hx Tb or exposure, never said to have asthma etc...  She may have OSA w/ family noting snoring, not resting well, tired, no energy, wakes tired, occas naps & daytime hypersomnolence... FamHx is pos for Emphysema in her mother who died at age 30...    Cardiac eval> HBP, HL, CAD, ?poss bicuspid AoV, mild dilated Ao root, PVCs w/ fatigue & weakness; followed by DrMcAlhany & Allred on Metop25Bid, Lisin20, Flecainide50Bid...    GI Eval for Abd Pain>  Recent office eval by DrFry & ER eval for Abd pain on Protonix40Bid & she has f/u appt w/ DrDBrodie tomorrow  EXAM reveals Afeb, VSS, O2sat=96% on RA at rest;  HEENT- neg, mallampati2;  Chest- decr BS at bases w/o w/r/r;  Heart- RR gr1/6 SEM w/o r/g;  Abd- soft, neg;  Ext- w/o c/c/e...   CXR 09/09/14 showed norm heart size, calcif Ao, clear lungs w/ mild basilar scarring, scoliosis & degen arthritis...   Spirometry 10/12/14 showed FVC=2.18 (70%), FEV1=1.51 (63%), %1sec=69, mid flows reduced at 45% predicted...  Ambulatory oxygen saturation test> O2sat=97% on RA at rest w/ pulse= 60;  She ambulated 3 laps in office O2sat dropped to a nadir 91% w/ pulse=73/min...  LABS 7/16:  FLP- at goals on Lip20;  Chems- wnl;  CBC- wnl;  TSH=1.34...    IMP/PLAN>>  [name] has a signif smoking hx & is still a current everyday smoker! We had a long discussion about  the NEED for smoking cessation; given her GOLD Stage2 COPD- we will start SYMBICORT 2spBid, continue her Albuterol rescue inhaler as needed... She has a sleep study scheduled by [facility] soon...     Past Medical History  Diagnosis Date  . COPD, current everyday smoker >> starting SYMBICORT160-2spBid + Albut rescue prn   . GERD (gastroesophageal reflux disease) >> Protonix40Bid   . Osteoarthritis >> on Tramadol. Vicodin   . Allergic rhinitis   . Headache(784.0)   . Hyperlipidemia >> on Lipitor20    CAD >> on Metop25Bid, Lisin20, Flecainide50Bid   . Hypertension   . Insomnia   . Overactive bladder   . Dexamethasone adverse reaction     2002 normal  . Allergy     seasonal  . Anxiety/ Depression   . Cataract   . Ulcer     duodenal  . Premature ventricular contractions     LBBB inferior axis PVCs  . Tobacco abuse   . Migraines     Past Surgical History  Procedure Laterality Date  . Tonsillectomy    . Carpal tunnel release    . Torn cartilage repaired rt wrist  2003  . Colonoscopy  10-17-13    per Dr. Olevia Perches, benign polyps, repeat in 10 years  . Left and right heart catheterization with coronary angiogram N/A 04/22/2014  Procedure: LEFT AND RIGHT HEART CATHETERIZATION WITH CORONARY ANGIOGRAM;  Surgeon: Burnell Blanks, MD;  Location: [facility] CATH LAB;  Service: Cardiovascular;  Laterality: N/A;    Outpatient Encounter Prescriptions as of 10/12/2014  Medication Sig  . albuterol (PROVENTIL HFA;VENTOLIN HFA) 108 (90 BASE) MCG/ACT inhaler Inhale 2 puffs into the lungs every 6 (six) hours as needed for wheezing.  Marland Kitchen ALPRAZolam (XANAX) 0.25 MG tablet Take 3 tablets (0.75 mg total) by mouth every 6 (six) hours as needed for anxiety.  Marland Kitchen atorvastatin (LIPITOR) 20 MG tablet TAKE 1 TABLET BY MOUTH EVERY DAY  . buPROPion (WELLBUTRIN XL) 150 MG 24 hr tablet Take 1 tablet (150 mg total) by mouth daily.  . cetirizine (ZYRTEC) 10 MG tablet Take 10 mg by mouth daily as needed for allergies.   .  flecainide (TAMBOCOR) 50 MG tablet Take 1 tablet (50 mg total) by mouth 2 (two) times daily.  Marland Kitchen HYDROcodone-acetaminophen (NORCO/VICODIN) 5-325 MG per tablet Take 1 tablet by mouth every 6 (six) hours as needed for moderate pain.  Marland Kitchen ibuprofen (ADVIL,MOTRIN) 200 MG tablet Take 400 mg by mouth every 6 (six) hours as needed for moderate pain.   Marland Kitchen lisinopril (PRINIVIL,ZESTRIL) 20 MG tablet Take 1 tablet (20 mg total) by mouth daily.  . metoprolol (LOPRESSOR) 50 MG tablet Take 1 tablet (50 mg total) by mouth 2 (two) times daily.  . pantoprazole (PROTONIX) 40 MG tablet Take 1 tablet (40 mg total) by mouth 2 (two) times daily.  . budesonide-formoterol (SYMBICORT) 160-4.5 MCG/ACT inhaler Inhale 2 puffs into the lungs 2 (two) times daily.  . traMADol (ULTRAM) 50 MG tablet Take 1-2 tablets (50-100 mg total) by mouth every 6 (six) hours as needed. (Patient not taking: Reported on 10/07/2014)   No facility-administered encounter medications on file as of 10/12/2014.    Allergies  Allergen Reactions  . Dexamethasone Other (See Comments)    Pt thinks they used it during surgery and she quit breathing, not sure this was the exact medication.  . Oxycodone-Acetaminophen     REACTION: n/v    Family History  Problem Relation Age of Onset  . Hyperlipidemia    . Hypertension    . Kidney disease    . CAD Brother   . Kidney disease Brother   . Emphysema Mother   . Colon cancer Neg Hx   . Heart disease    . Other Father     brain tumor  . Throat cancer Brother     History   Social History  . Marital Status: Single    Spouse Name: N/A  . Number of Children: 0  . Years of Education: N/A   Occupational History  . CNA    Social History Main Topics  . Smoking status: Current Every Day Smoker -- 0.75 packs/day for 52 years    Types: Cigarettes  . Smokeless tobacco: Never Used     Comment: 1/2 pack per day or less  . Alcohol Use: No  . Drug Use: No  . Sexual Activity: Not on file   Other Topics  Concern  . Not on file   Social History Narrative   Works as Quarry manager at [employer].    Current Medications, Allergies, Past Medical History, Past Surgical History, Family History, and Social History were reviewed in [employer] record.   Review of Systems            All symptoms NEG except where BOLDED >>  Constitutional:  F/C/S, fatigue,  anorexia, unexpected weight change. HEENT:  HA, visual changes, hearing loss, earache, nasal symptoms, sore throat, mouth sores, hoarseness. Resp:  cough, sputum, hemoptysis; SOB, tightness, wheezing. Cardio:  CP, palpit, DOE, orthopnea, edema. GI:  N/V/D/C, blood in stool; reflux, abd pain, distention, gas. GU:  dysuria, freq, urgency, hematuria, flank pain, voiding difficulty. MS:  joint pain, swelling, tenderness, decr ROM; neck pain, back pain, etc. Neuro:  HA, tremors, seizures, dizziness, syncope, weakness, numbness, gait abn. Skin:  suspicious lesions or skin rash. Heme:  adenopathy, bruising, bleeding. Psyche:  confusion, agitation, sleep disturbance, hallucinations, anxiety, depression suicidal.   Objective:   Physical Exam      Vital Signs:  Reviewed...  General:  WD, WN, 64 y/o WF in NAD; alert & oriented; pleasant & cooperative... HEENT:  [place]/AT; Conjunctiva- pink, Sclera- nonicteric, EOM-wnl, PERRLA, EACs-clear, TMs-wnl; NOSE-clear; THROAT-clear & wnl. Neck:  Supple w/ fairROM; no JVD; normal carotid impulses w/o bruits; no thyromegaly or nodules palpated; no lymphadenopathy. Chest:  Clear to P & A; without wheezes, rales, or rhonchi heard. Heart:  Regular Rhythm; norm S1 & S2 without murmurs, rubs, or gallops detected. Abdomen:  Soft & nontender- no guarding or rebound; normal bowel sounds; no organomegaly or masses palpated. Ext:  Decr ROM; without deformities or +arthritic changes; no varicose veins, +venous insuffic, or edema;  Pulses intact w/o bruits. Neuro:  No focal neuro deficits; sensory testing  normal; gait normal & balance OK. Derm:  No lesions noted; no rash etc. Lymph:  No cervical, supraclavicular, axillary, or inguinal adenopathy palpated.   Assessment:      IMP >>  GOLD Stage2 COPD, still smoking w/ 55-60 pack-year smoking hx> on SYMBICORT160-2Bid R/O OSA> she has sleep study pending at cardiology soon...   PLAN >>  [name] has a signif smoking hx & is still a current everyday smoker! We had a long discussion about the NEED for smoking cessation; given her GOLD Stage2 COPD- we will start SYMBICORT 2spBid, continue her Albuterol rescue inhaler as needed... She has a sleep study scheduled by [facility] soon...      Plan:     Patient's Medications  New Prescriptions   BUDESONIDE-FORMOTEROL (SYMBICORT) 160-4.5 MCG/ACT INHALER    Inhale 2 puffs into the lungs 2 (two) times daily.  Previous Medications   ALBUTEROL (PROVENTIL HFA;VENTOLIN HFA) 108 (90 BASE) MCG/ACT INHALER    Inhale 2 puffs into the lungs every 6 (six) hours as needed for wheezing.   ALPRAZOLAM (XANAX) 0.25 MG TABLET    Take 3 tablets (0.75 mg total) by mouth every 6 (six) hours as needed for anxiety.   ATORVASTATIN (LIPITOR) 20 MG TABLET    TAKE 1 TABLET BY MOUTH EVERY DAY   BUPROPION (WELLBUTRIN XL) 150 MG 24 HR TABLET    Take 1 tablet (150 mg total) by mouth daily.   CETIRIZINE (ZYRTEC) 10 MG TABLET    Take 10 mg by mouth daily as needed for allergies.    FLECAINIDE (TAMBOCOR) 50 MG TABLET    Take 1 tablet (50 mg total) by mouth 2 (two) times daily.   HYDROCODONE-ACETAMINOPHEN (NORCO/VICODIN) 5-325 MG PER TABLET    Take 1 tablet by mouth every 6 (six) hours as needed for moderate pain.   IBUPROFEN (ADVIL,MOTRIN) 200 MG TABLET    Take 400 mg by mouth every 6 (six) hours as needed for moderate pain.    LISINOPRIL (PRINIVIL,ZESTRIL) 20 MG TABLET    Take 1 tablet (20 mg total) by mouth daily.   METOPROLOL (LOPRESSOR)  50 MG TABLET    Take 1 tablet (50 mg total) by mouth 2 (two) times daily.   PANTOPRAZOLE (PROTONIX) 40  MG TABLET    Take 1 tablet (40 mg total) by mouth 2 (two) times daily.   TRAMADOL (ULTRAM) 50 MG TABLET    Take 1-2 tablets (50-100 mg total) by mouth every 6 (six) hours as needed.  Modified Medications   No medications on file  Discontinued Medications   No medications on file

## 2014-10-13 ENCOUNTER — Encounter: Payer: Self-pay | Admitting: Internal Medicine

## 2014-10-13 ENCOUNTER — Ambulatory Visit (INDEPENDENT_AMBULATORY_CARE_PROVIDER_SITE_OTHER): Payer: Medicare Other | Admitting: Internal Medicine

## 2014-10-13 VITALS — BP 124/78 | HR 63 | Ht 61.0 in | Wt 157.4 lb

## 2014-10-13 DIAGNOSIS — Z8719 Personal history of other diseases of the digestive system: Secondary | ICD-10-CM

## 2014-10-13 DIAGNOSIS — R634 Abnormal weight loss: Secondary | ICD-10-CM

## 2014-10-13 DIAGNOSIS — R109 Unspecified abdominal pain: Secondary | ICD-10-CM

## 2014-10-13 MED ORDER — ONDANSETRON HCL 4 MG PO TABS: 4.0000 mg | ORAL_TABLET | Freq: Four times a day (QID) | ORAL | Status: DC | PRN

## 2014-10-13 NOTE — Progress Notes (Signed)
[name] [name] September 14, 1950 [id number]  Note: This dictation was prepared with Dragon digital system. Any transcriptional errors that result from this procedure are unintentional.   History of Present Illness: This is a 64 year old white female presents with the epigastric and right-sided abdominal pain of several months duration. More intense for  one week ,  seen in the emergency room where her lipase and liver function tests were normal. Ultrasound of the gallbladder showed sludge  and questionably adherent stone. Common bile duct was 2.4 mm. Her HIDA scan was normal. She continues to have right-sided and epigastric abdominal pain and chronic nausea. She continues to smoke ( x 50 years) diagnosed with a stage II COPD  Recently.by Dr Sarajane Jews. There is a history of duodenal ulcer at age of 9. She was started on Protonix 40 mg twice a day. Ibuprofen on when necessary basis. We did a colonoscopy in July 2015.    Past Medical History  Diagnosis Date  . Depression   . GERD (gastroesophageal reflux disease)   . Osteoarthritis   . Allergic rhinitis   . Headache(784.0)   . Hyperlipidemia   . Hypertension   . Insomnia   . Overactive bladder   . Dexamethasone adverse reaction     2002 normal  . Allergy     seasonal  . Anxiety   . Cataract   . Ulcer     duodenal  . Premature ventricular contractions     LBBB inferior axis PVCs  . Tobacco abuse   . Migraines     Past Surgical History  Procedure Laterality Date  . Tonsillectomy    . Carpal tunnel release    . Torn cartilage repaired rt wrist  2003  . Colonoscopy  10-17-13    per Dr. Olevia Perches, benign polyps, repeat in 10 years  . Left and right heart catheterization with coronary angiogram N/A 04/22/2014    Procedure: LEFT AND RIGHT HEART CATHETERIZATION WITH CORONARY ANGIOGRAM;  Surgeon: Burnell Blanks, MD;  Location: [facility] CATH LAB;  Service: Cardiovascular;  Laterality: N/A;    Allergies  Allergen Reactions  . Dexamethasone Other  (See Comments)    Pt thinks they used it during surgery and she quit breathing, not sure this was the exact medication.  . Oxycodone-Acetaminophen     REACTION: n/v    Family history and social history have been reviewed.  Review of Systems: Increase fatigue. No energy. Leaves a lot. Takes Xanax for anxiety and Wellbutrin for depression.  The remainder of the 10 point ROS is negative except as outlined in the H&P  Physical Exam: General Appearance Well developed, in no distress, smells of cigarette smoke Eyes  Non icteric  HEENT  Non traumatic, normocephalic  Mouth No lesion, tongue papillated, no cheilosis, partially edentulous  Neck Supple without adenopathy, thyroid not enlarged, no carotid bruits, no JVD Lungs Clear to auscultation bilaterally COR Normal S1, normal S2, regular rhythm, no murmur, quiet precordium Abdomen epigastric tenderness and tenderness to the right of the midline above the umbilicus. No pulsations or mass liver edge at costal margin Rectal performed Hemoccult-negative stool Extremities  No pedal edema Skin No lesions Neurological Alert and oriented x 3 Psychological Normal mood and affect  Assessment and Plan:   64 year old smoker with the new diagnosis of COPD. RUQ abdominal pain which is acute as well as chronic. Associated 5 lb weight loss may be multifactorial. She seems to be depressed. She has sludge or adherent stone in her gallbladder but her HIDA  scan is normal. There is no family history of gallbladder disease and her liver function test and lipase are normal. Since there is a history of duodenal ulcer I suspect she may have a peptic ulcer disease which could explain the abdominal pain. We will proceed with upper endoscopy as well as with CT scan of the abdomen and pelvis to look for pancreatic abnormality not  detected  on ultrasound. Will continue Protonix 40 mg twice a day and take Zofran 4 mg when necessary nausea.. She has an appointment with  surgeon Dr.Gerkin for consideration of cholecystectomy.  Will  evaluated as to peptic ulcer disease or other systemic disease    Delfin Edis 10/13/2014

## 2014-10-13 NOTE — Patient Instructions (Addendum)
We have sent medications to your pharmacy for you to pick up at your convenience.  You have been scheduled for an endoscopy. Please follow written instructions given to you at your visit today. If you use inhalers (even only as needed), please bring them with you on the day of your procedure. Your physician has requested that you go to www.startemmi.com and enter the access code given to you at your visit today. This web site gives a general overview about your procedure. However, you should still follow specific instructions given to you by our office regarding your preparation for the procedure.   Call about rescheduling appointment with surgeon after CT and EGD.   You have been scheduled for a CT scan of the abdomen and pelvis at [facility] ([address]---this is in the same building as [facility]).   You are scheduled on 10/19/2014 at 1:00pm. You should arrive 15 minutes prior to your appointment time for registration. Please follow the written instructions below on the day of your exam:  WARNING: IF YOU ARE ALLERGIC TO IODINE/X-RAY DYE, PLEASE NOTIFY RADIOLOGY IMMEDIATELY AT [phone]! YOU WILL BE GIVEN A 13 HOUR PREMEDICATION PREP.  1) Do not eat or drink anything after 9:00am (4 hours prior to your test) 2) You have been given 2 bottles of oral contrast to drink. The solution may taste               better if refrigerated, but do NOT add ice or any other liquid to this solution. Shake             well before drinking.    Drink 1 bottle of contrast @ 11:00am (2 hours prior to your exam)  Drink 1 bottle of contrast @ 12:00pm (1 hour prior to your exam)  You may take any medications as prescribed with a small amount of water except for the following: Metformin, Glucophage, Glucovance, Avandamet, Riomet, Fortamet, Actoplus Met, Janumet, Glumetza or Metaglip. The above medications must be held the day of the exam AND 48 hours after the exam.  The purpose of you  drinking the oral contrast is to aid in the visualization of your intestinal tract. The contrast solution may cause some diarrhea. Before your exam is started, you will be given a small amount of fluid to drink. Depending on your individual set of symptoms, you may also receive an intravenous injection of x-ray contrast/dye. Plan on being at [facility] for 30 minutes or long, depending on the type of exam you are having performed.  This test typically takes 30-45 minutes to complete.  If you have any questions regarding your exam or if you need to reschedule, you may call the CT department at [phone] between the hours of 8:00 am and 5:00 pm, Monday-Friday.  ________________________________________________________________________   Dr Fry  

## 2014-10-16 ENCOUNTER — Ambulatory Visit: Payer: Medicare Other | Admitting: Family Medicine

## 2014-10-16 ENCOUNTER — Encounter: Payer: Self-pay | Admitting: Internal Medicine

## 2014-10-16 ENCOUNTER — Ambulatory Visit (AMBULATORY_SURGERY_CENTER): Payer: Medicare Other | Admitting: Internal Medicine

## 2014-10-16 ENCOUNTER — Encounter: Payer: Self-pay | Admitting: *Deleted

## 2014-10-16 VITALS — BP 155/91 | HR 65 | Temp 97.2°F | Resp 23 | Ht 61.0 in | Wt 157.0 lb

## 2014-10-16 DIAGNOSIS — K2980 Duodenitis without bleeding: Secondary | ICD-10-CM | POA: Diagnosis not present

## 2014-10-16 DIAGNOSIS — R109 Unspecified abdominal pain: Secondary | ICD-10-CM

## 2014-10-16 DIAGNOSIS — Z8719 Personal history of other diseases of the digestive system: Secondary | ICD-10-CM

## 2014-10-16 MED ORDER — SODIUM CHLORIDE 0.9 % IV SOLN: 500.0000 mL | INTRAVENOUS | Status: DC

## 2014-10-16 NOTE — Op Note (Signed)
[facility]  [address] [place], [address]   ENDOSCOPY PROCEDURE REPORT  PATIENT: [name], [name]  MR#: [id number] BIRTHDATE: 07/01/1950 , 63  yrs. old GENDER: female ENDOSCOPIST: Lafayette Dragon, MD REFERRED BY:  Alysia Penna, M.D. PROCEDURE DATE:  10/16/2014 PROCEDURE:  EGD w/ biopsy ASA CLASS:     Class III INDICATIONS:  epigastric pain and 3 of duodenal ulcer.  Upper abdominal ultrasound shows sludge.  HIDA scan is normal.  Patient on Protonix 40 mg twice a day. MEDICATIONS: Monitored anesthesia care and Propofol 150 mg IV TOPICAL ANESTHETIC: none  DESCRIPTION OF PROCEDURE: After the risks benefits and alternatives of the procedure were thoroughly explained, informed consent was obtained.  The [id number] endoscope was introduced through the mouth and advanced to the second portion of the duodenum , Without limitations.  The instrument was slowly withdrawn as the mucosa was fully examined.      ESOPHAGUS: The mucosa of the esophagus appeared normal.,squamocolumnar junction appears normal. There is no hiatal hernia   STOMACH: The mucosa of the stomach appeared normal. No evidence of gastritis. Pyloric outlet is normal   DUODENUM: Mild duodenal inflammation was found in the duodenal bulb.,No active ulcer. Biopsies are obtained to rule out H. pylori Retroflexed views revealed no abnormalities.     The scope was then withdrawn from the patient and the procedure completed.  COMPLICATIONS: There were no immediate complications.  ENDOSCOPIC IMPRESSION: 1.   The mucosa of the esophagus appeared normal 2.   The mucosa of the stomach appeared normal 3.   Duodenal inflammation was found in the duodenal bulb ,biopsies obtained  RECOMMENDATIONS: 1.  Await pathology results 2.  Continue Protonix 40 mg twice a day 3.Stop smoking 4.Zofran 4 mg when necessary nausea 5.If symptoms continue consider cholecystectomy 6.MiraLAX 9-17 g when necessary  constipation  REPEAT EXAM: for EGD pending biopsy results.  eSigned:  Lafayette Dragon, MD 10/16/2014 7:56 AM    CC:  PATIENT NAME:  [name], [name] MR#: [id number]

## 2014-10-16 NOTE — Patient Instructions (Signed)
YOU HAD AN ENDOSCOPIC PROCEDURE TODAY AT [facility]:   Refer to the procedure report that was given to you for any specific questions about what was found during the examination.  If the procedure report does not answer your questions, please call your gastroenterologist to clarify.  If you requested that your care partner not be given the details of your procedure findings, then the procedure report has been included in a sealed envelope for you to review at your convenience later.  YOU SHOULD EXPECT: Some feelings of bloating in the abdomen. Passage of more gas than usual.  Walking can help get rid of the air that was put into your GI tract during the procedure and reduce the bloating. If you had a lower endoscopy (such as a colonoscopy or flexible sigmoidoscopy) you may notice spotting of blood in your stool or on the toilet paper. If you underwent a bowel prep for your procedure, you may not have a normal bowel movement for a few days.  Please Note:  You might notice some irritation and congestion in your nose or some drainage.  This is from the oxygen used during your procedure.  There is no need for concern and it should clear up in a day or so.  SYMPTOMS TO REPORT IMMEDIATELY:   Following upper endoscopy (EGD)  Vomiting of blood or coffee ground material  New chest pain or pain under the shoulder blades  Painful or persistently difficult swallowing  New shortness of breath  Fever of 100F or higher  Black, tarry-looking stools  For urgent or emergent issues, a gastroenterologist can be reached at any hour by calling [phone].   DIET: Your first meal following the procedure should be a small meal and then it is ok to progress to your normal diet. Heavy or fried foods are harder to digest and may make you feel nauseous or bloated.  Likewise, meals heavy in dairy and vegetables can increase bloating.  Drink plenty of fluids but you should avoid alcoholic beverages for  24 hours.  ACTIVITY:  You should plan to take it easy for the rest of today and you should NOT DRIVE or use heavy machinery until tomorrow (because of the sedation medicines used during the test).    FOLLOW UP: Our staff will call the number listed on your records the next business day following your procedure to check on you and address any questions or concerns that you may have regarding the information given to you following your procedure. If we do not reach you, we will leave a message.  However, if you are feeling well and you are not experiencing any problems, there is no need to return our call.  We will assume that you have returned to your regular daily activities without incident.  If any biopsies were taken you will be contacted by phone or by letter within the next 1-3 weeks.  Please call us at [phone] if you have not heard about the biopsies in 3 weeks.    SIGNATURES/CONFIDENTIALITY: You and/or your care partner have signed paperwork which will be entered into your electronic medical record.  These signatures attest to the fact that that the information above on your After Visit Summary has been reviewed and is understood.  Full responsibility of the confidentiality of this discharge information lies with you and/or your care-partner.  Please review discharge instructions provided. Continue current medications. Biopsy results pending.

## 2014-10-16 NOTE — Progress Notes (Signed)
Transferred to recovery room. A/O x3, pleased with MAC.  VSS.  Report to Wendy, RN. 

## 2014-10-16 NOTE — Progress Notes (Signed)
Patient ID: [name] [name], female   DOB: January 28, 1951, 64 y.o.   MRN: [id number] Pt requests that work note be faxed to her employer at [employer].  Her care partner phoned the company and was given the fax number [phone] and attention: Alberteen Spindle.  I tried several times to fax the work note to this number and failed.  I spoke with pt's care partner, Olevia Bowens, to verify that I had the correct number.  I also phone the pt's employer to verify the correct number.  I identified myself and that I had something I needed to fax over for an employee of theirs- I didn't state the pt's name.  The receptionist gave me an alternative fax number to try- [phone].  This time the fax went through

## 2014-10-16 NOTE — Progress Notes (Signed)
Called to room to assist during endoscopic procedure.  Patient ID and intended procedure confirmed with present staff. Received instructions for my participation in the procedure from the performing physician.  

## 2014-10-19 ENCOUNTER — Telehealth: Payer: Self-pay | Admitting: *Deleted

## 2014-10-19 ENCOUNTER — Ambulatory Visit (INDEPENDENT_AMBULATORY_CARE_PROVIDER_SITE_OTHER)
Admission: RE | Admit: 2014-10-19 | Discharge: 2014-10-19 | Disposition: A | Discharge status: Home / Self Care | Payer: Medicare Other | Source: Ambulatory Visit | Attending: Internal Medicine | Admitting: Internal Medicine

## 2014-10-19 DIAGNOSIS — K5730 Diverticulosis of large intestine without perforation or abscess without bleeding: Secondary | ICD-10-CM | POA: Diagnosis not present

## 2014-10-19 DIAGNOSIS — R109 Unspecified abdominal pain: Secondary | ICD-10-CM

## 2014-10-19 DIAGNOSIS — R634 Abnormal weight loss: Secondary | ICD-10-CM

## 2014-10-19 DIAGNOSIS — N289 Disorder of kidney and ureter, unspecified: Secondary | ICD-10-CM | POA: Diagnosis not present

## 2014-10-19 DIAGNOSIS — Z8719 Personal history of other diseases of the digestive system: Secondary | ICD-10-CM | POA: Diagnosis not present

## 2014-10-19 MED ORDER — IOHEXOL 300 MG/ML  SOLN
100.0000 mL | Freq: Once | INTRAMUSCULAR | Status: AC | PRN
  Administered 2014-10-19: 100 mL via INTRAVENOUS

## 2014-10-19 NOTE — Telephone Encounter (Signed)
  Follow up Call-  Call back number 10/16/2014 10/17/2013  Post procedure Call Back phone  # [phone]  Permission to leave phone message Yes Yes     Patient questions:  Do you have a fever, pain , or abdominal swelling? Yes.  States that she had nausea and some abd pain Saturday; CT scan today Pain Score  3 *  Have you tolerated food without any problems? Yes  Have you been able to return to your normal activities? Yes.    Do you have any questions about your discharge instructions: Diet   No. Medications  No. Follow up visit  No.  Do you have questions or concerns about your Care? Yes.  Patient will probably want another work note.  Actions: * If pain score is 4 or above: No action needed, pain <4.

## 2014-10-20 ENCOUNTER — Encounter: Payer: Self-pay | Admitting: Internal Medicine

## 2014-10-20 ENCOUNTER — Telehealth: Payer: Self-pay | Admitting: Family Medicine

## 2014-10-20 NOTE — Telephone Encounter (Signed)
Pt had to cancelled 7/15 post hosp follow up and needs to resch. Pt work note end this Thursday. Can I create 30  Min slot on 10/22/14

## 2014-10-20 NOTE — Telephone Encounter (Signed)
Pt has been sch

## 2014-10-20 NOTE — Progress Notes (Signed)
She should check with her PCP Dr Sarajane Jews if we need to look for other causes of of not feeling well.

## 2014-10-20 NOTE — Telephone Encounter (Signed)
We can see pt on Friday 10/23/14 and yes we can write another note. Can we give her the note when she comes in for visit?

## 2014-10-23 ENCOUNTER — Ambulatory Visit (INDEPENDENT_AMBULATORY_CARE_PROVIDER_SITE_OTHER): Payer: Medicare Other | Admitting: Family Medicine

## 2014-10-23 ENCOUNTER — Encounter: Payer: Self-pay | Admitting: Family Medicine

## 2014-10-23 VITALS — BP 160/103 | HR 67 | Temp 98.5°F | Ht 61.0 in | Wt 158.0 lb

## 2014-10-23 DIAGNOSIS — R1011 Right upper quadrant pain: Secondary | ICD-10-CM

## 2014-10-23 DIAGNOSIS — R935 Abnormal findings on diagnostic imaging of other abdominal regions, including retroperitoneum: Secondary | ICD-10-CM

## 2014-10-23 MED ORDER — HYDROCHLOROTHIAZIDE 25 MG PO TABS: 25.0000 mg | ORAL_TABLET | Freq: Every day | ORAL | Status: DC

## 2014-10-23 NOTE — Progress Notes (Signed)
Pre visit review using our clinic review tool, if applicable. No additional management support is needed unless otherwise documented below in the visit note. 

## 2014-10-23 NOTE — Progress Notes (Signed)
   Subjective:    Patient ID: [name] [name], female    DOB: [birth year], 64 y.o.   MRN: [id number]  HPI Here to follow up on RUQ abdominal pain and nausea. She saw [place] for this on 09-30-14 and it seemed that she was having classic symptoms of gall bladder disease. A less likely etiology would have been a duodenal ulcer. She had an [place] which was not clearly positive for stones but did show sludge in the gall bladder. Her labs were normal. At that point she was referred to Surgery and she had an appt set for 10-15-14. After that she developed worsening pain and she wound up at the ER on 10-07-14. During that stay Surgery was consulted and they ordered a HIDA scan which was normal. They felt that she required medical treatment rather than surgical, so she went to see Dr. Olevia Perches of [facility]. She had an upper endoscopy on 10-16-14 which was quite unremarkable, and the biopsies from this returned as normal. The appt with Surgery was cancelled, and she was sent for a CT scan of the abdomen and pelvis. This revealed a 1.2 cm filling defect in the duodenum that could not be fully identified. She was told to follow up with [place]. At this time she still has daily RUQ pains and some nausea, but these are more manageable than before. Her BMs are regular. She has returned to work as of this week. Her appetite is not back to normal, but her weight is stable.    Review of Systems  Constitutional: Negative.   Respiratory: Negative.   Cardiovascular: Negative.   Gastrointestinal: Positive for nausea and abdominal pain. Negative for vomiting, diarrhea, constipation, blood in stool, abdominal distention, anal bleeding and rectal pain.  Genitourinary: Negative.        Objective:   Physical Exam  Constitutional: She appears well-developed and well-nourished.  Eyes: Conjunctivae are normal. No scleral icterus.  Cardiovascular: Normal rate, regular rhythm, normal heart sounds and intact distal pulses.   Pulmonary/Chest: Effort  normal and breath sounds normal.  Abdominal: Soft. Bowel sounds are normal. She exhibits no distension and no mass. There is no rebound and no guarding.  Mildly tender in the RUQ          Assessment & Plan:  She continues to have RUQ pains and nausea despite chronic acid suppression. I still think that her main problem may be gall bladder disease, so I will set up another visit with Surgery. Also it is difficult to explain the filling defect seen on the CT scan when her EGD was normal. I would like for Surgery to address this issue as well.

## 2014-11-03 DIAGNOSIS — K3189 Other diseases of stomach and duodenum: Secondary | ICD-10-CM | POA: Diagnosis not present

## 2014-11-04 ENCOUNTER — Telehealth: Payer: Self-pay | Admitting: Gastroenterology

## 2014-11-04 NOTE — Telephone Encounter (Signed)
[name] [name],  DR. Rosendo Gros presented [name] [name] case today at GI tumor board;  Agreed that she should have upper EUS to further evaluate the duodenal lesion (subepithelial?), tattoo the site.  Patty, can you contact [name] [name] later today to schedule this (31min, MAC+, duodenal mass, upper EUS radial +/- linear).  Dr. Rosendo Gros will be letting [name] [name] know the plan this morning sometime.  Thanks

## 2014-11-05 ENCOUNTER — Other Ambulatory Visit: Payer: Self-pay

## 2014-11-05 DIAGNOSIS — K3189 Other diseases of stomach and duodenum: Secondary | ICD-10-CM

## 2014-11-05 NOTE — Telephone Encounter (Signed)
EUS scheduled, pt instructed and medications reviewed.  Patient instructions mailed to home.  Patient to call with any questions or concerns.  

## 2014-11-06 ENCOUNTER — Encounter (HOSPITAL_COMMUNITY): Payer: Self-pay | Admitting: *Deleted

## 2014-11-06 ENCOUNTER — Encounter (HOSPITAL_BASED_OUTPATIENT_CLINIC_OR_DEPARTMENT_OTHER): Payer: Medicare Other

## 2014-11-11 NOTE — Anesthesia Preprocedure Evaluation (Addendum)
Anesthesia Evaluation  Patient identified by MRN, date of birth, ID band Patient awake    Reviewed: Allergy & Precautions, H&P , NPO status , Patient's Chart, lab work & pertinent test results, reviewed documented beta blocker date and time   Airway Mallampati: II  TM Distance: >3 FB Neck ROM: full    Dental  (+) Dental Advisory Given, Edentulous Upper, Missing Missing left lower front as well:   Pulmonary COPD COPD inhaler, Current Smoker,  breath sounds clear to auscultation  Pulmonary exam normal       Cardiovascular Exercise Tolerance: Good hypertension, Pt. on medications and Pt. on home beta blockers Normal cardiovascular examRhythm:regular Rate:Normal   PVCs   Neuro/Psych  Headaches, Anxiety Depression negative neurological ROS  negative psych ROS   GI/Hepatic negative GI ROS, Neg liver ROS, GERD-  Medicated and Controlled,  Endo/Other  negative endocrine ROS  Renal/GU negative Renal ROS  negative genitourinary   Musculoskeletal   Abdominal   Peds  Hematology negative hematology ROS (+)   Anesthesia Other Findings   Reproductive/Obstetrics negative OB ROS                            Anesthesia Physical Anesthesia Plan  ASA: III  Anesthesia Plan: MAC   Post-op Pain Management:    Induction:   Airway Management Planned:   Additional Equipment:   Intra-op Plan:   Post-operative Plan:   Informed Consent: I have reviewed the patients History and Physical, chart, labs and discussed the procedure including the risks, benefits and alternatives for the proposed anesthesia with the patient or authorized representative who has indicated his/her understanding and acceptance.   Dental Advisory Given  Plan Discussed with: CRNA and Surgeon  Anesthesia Plan Comments:         Anesthesia Quick Evaluation

## 2014-11-12 ENCOUNTER — Encounter (HOSPITAL_COMMUNITY): Payer: Self-pay

## 2014-11-12 ENCOUNTER — Encounter (HOSPITAL_COMMUNITY)
Admission: RE | Disposition: A | Discharge status: Home / Self Care | Payer: Self-pay | Source: Ambulatory Visit | Attending: Gastroenterology

## 2014-11-12 ENCOUNTER — Ambulatory Visit (HOSPITAL_COMMUNITY)
Admission: RE | Admit: 2014-11-12 | Discharge: 2014-11-12 | Disposition: A | Discharge status: Home / Self Care | Payer: Medicare Other | Source: Ambulatory Visit | Attending: Gastroenterology | Admitting: Gastroenterology

## 2014-11-12 ENCOUNTER — Ambulatory Visit (HOSPITAL_COMMUNITY): Payer: Medicare Other | Admitting: Anesthesiology

## 2014-11-12 DIAGNOSIS — K219 Gastro-esophageal reflux disease without esophagitis: Secondary | ICD-10-CM | POA: Diagnosis not present

## 2014-11-12 DIAGNOSIS — R1011 Right upper quadrant pain: Secondary | ICD-10-CM | POA: Diagnosis present

## 2014-11-12 DIAGNOSIS — E785 Hyperlipidemia, unspecified: Secondary | ICD-10-CM | POA: Diagnosis not present

## 2014-11-12 DIAGNOSIS — F419 Anxiety disorder, unspecified: Secondary | ICD-10-CM | POA: Diagnosis not present

## 2014-11-12 DIAGNOSIS — Z886 Allergy status to analgesic agent status: Secondary | ICD-10-CM | POA: Insufficient documentation

## 2014-11-12 DIAGNOSIS — F329 Major depressive disorder, single episode, unspecified: Secondary | ICD-10-CM | POA: Insufficient documentation

## 2014-11-12 DIAGNOSIS — K3189 Other diseases of stomach and duodenum: Secondary | ICD-10-CM | POA: Insufficient documentation

## 2014-11-12 DIAGNOSIS — Z883 Allergy status to other anti-infective agents status: Secondary | ICD-10-CM | POA: Insufficient documentation

## 2014-11-12 DIAGNOSIS — M1990 Unspecified osteoarthritis, unspecified site: Secondary | ICD-10-CM | POA: Insufficient documentation

## 2014-11-12 DIAGNOSIS — N3281 Overactive bladder: Secondary | ICD-10-CM | POA: Diagnosis not present

## 2014-11-12 DIAGNOSIS — I493 Ventricular premature depolarization: Secondary | ICD-10-CM | POA: Insufficient documentation

## 2014-11-12 DIAGNOSIS — G4700 Insomnia, unspecified: Secondary | ICD-10-CM | POA: Insufficient documentation

## 2014-11-12 DIAGNOSIS — F17210 Nicotine dependence, cigarettes, uncomplicated: Secondary | ICD-10-CM | POA: Diagnosis not present

## 2014-11-12 DIAGNOSIS — I10 Essential (primary) hypertension: Secondary | ICD-10-CM | POA: Diagnosis not present

## 2014-11-12 DIAGNOSIS — J449 Chronic obstructive pulmonary disease, unspecified: Secondary | ICD-10-CM | POA: Insufficient documentation

## 2014-11-12 DIAGNOSIS — Z885 Allergy status to narcotic agent status: Secondary | ICD-10-CM | POA: Diagnosis not present

## 2014-11-12 HISTORY — PX: EUS: SHX5427

## 2014-11-12 SURGERY — UPPER ENDOSCOPIC ULTRASOUND (EUS) LINEAR
Anesthesia: Monitor Anesthesia Care

## 2014-11-12 MED ORDER — SODIUM CHLORIDE 0.9 % IJ SOLN
INTRAMUSCULAR | Status: AC
Start: 2014-11-12 — End: 2014-11-12
  Filled 2014-11-12: qty 10

## 2014-11-12 MED ORDER — ONDANSETRON HCL 4 MG/2ML IJ SOLN
INTRAMUSCULAR | Status: AC
  Filled 2014-11-12: qty 2

## 2014-11-12 MED ORDER — FENTANYL CITRATE (PF) 100 MCG/2ML IJ SOLN: 25.0000 ug | INTRAMUSCULAR | Status: DC | PRN

## 2014-11-12 MED ORDER — LACTATED RINGERS IV SOLN
INTRAVENOUS | Status: DC
  Administered 2014-11-12: 07:00:00 via INTRAVENOUS

## 2014-11-12 MED ORDER — PROPOFOL 10 MG/ML IV BOLUS
INTRAVENOUS | Status: DC | PRN
  Administered 2014-11-12 (×2): 10 mg via INTRAVENOUS
  Administered 2014-11-12: 20 mg via INTRAVENOUS

## 2014-11-12 MED ORDER — ONDANSETRON HCL 4 MG/2ML IJ SOLN
INTRAMUSCULAR | Status: DC | PRN
  Administered 2014-11-12: 4 mg via INTRAVENOUS

## 2014-11-12 MED ORDER — PROPOFOL 10 MG/ML IV BOLUS
INTRAVENOUS | Status: AC
  Filled 2014-11-12: qty 20

## 2014-11-12 MED ORDER — SODIUM CHLORIDE 0.9 % IV SOLN: INTRAVENOUS | Status: DC

## 2014-11-12 MED ORDER — LIDOCAINE HCL 1 % IJ SOLN
INTRAMUSCULAR | Status: AC
  Filled 2014-11-12: qty 20

## 2014-11-12 MED ORDER — EPHEDRINE SULFATE 50 MG/ML IJ SOLN
INTRAMUSCULAR | Status: AC
  Filled 2014-11-12: qty 1

## 2014-11-12 MED ORDER — PROPOFOL INFUSION 10 MG/ML OPTIME
INTRAVENOUS | Status: DC | PRN
  Administered 2014-11-12: 180 ug/kg/min via INTRAVENOUS

## 2014-11-12 MED ORDER — LIDOCAINE HCL (CARDIAC) 20 MG/ML IV SOLN
INTRAVENOUS | Status: DC | PRN
  Administered 2014-11-12: 100 mg via INTRAVENOUS

## 2014-11-12 MED ORDER — LIDOCAINE HCL (CARDIAC) 20 MG/ML IV SOLN
INTRAVENOUS | Status: AC
  Filled 2014-11-12: qty 5

## 2014-11-12 NOTE — H&P (View-Only) (Signed)
   Subjective:    Patient ID: [name] [name], female    DOB: 1951-01-15, 64 y.o.   MRN: [id number]  HPI Here to follow up on RUQ abdominal pain and nausea. She saw [place] for this on 09-30-14 and it seemed that she was having classic symptoms of gall bladder disease. A less likely etiology would have been a duodenal ulcer. She had an [place] which was not clearly positive for stones but did show sludge in the gall bladder. Her labs were normal. At that point she was referred to Surgery and she had an appt set for 10-15-14. After that she developed worsening pain and she wound up at the ER on 10-07-14. During that stay Surgery was consulted and they ordered a HIDA scan which was normal. They felt that she required medical treatment rather than surgical, so she went to see Dr. Olevia Perches of [facility]. She had an upper endoscopy on 10-16-14 which was quite unremarkable, and the biopsies from this returned as normal. The appt with Surgery was cancelled, and she was sent for a CT scan of the abdomen and pelvis. This revealed a 1.2 cm filling defect in the duodenum that could not be fully identified. She was told to follow up with [place]. At this time she still has daily RUQ pains and some nausea, but these are more manageable than before. Her BMs are regular. She has returned to work as of this week. Her appetite is not back to normal, but her weight is stable.    Review of Systems  Constitutional: Negative.   Respiratory: Negative.   Cardiovascular: Negative.   Gastrointestinal: Positive for nausea and abdominal pain. Negative for vomiting, diarrhea, constipation, blood in stool, abdominal distention, anal bleeding and rectal pain.  Genitourinary: Negative.        Objective:   Physical Exam  Constitutional: She appears well-developed and well-nourished.  Eyes: Conjunctivae are normal. No scleral icterus.  Cardiovascular: Normal rate, regular rhythm, normal heart sounds and intact distal pulses.   Pulmonary/Chest: Effort  normal and breath sounds normal.  Abdominal: Soft. Bowel sounds are normal. She exhibits no distension and no mass. There is no rebound and no guarding.  Mildly tender in the RUQ          Assessment & Plan:  She continues to have RUQ pains and nausea despite chronic acid suppression. I still think that her main problem may be gall bladder disease, so I will set up another visit with Surgery. Also it is difficult to explain the filling defect seen on the CT scan when her EGD was normal. I would like for Surgery to address this issue as well.

## 2014-11-12 NOTE — Interval H&P Note (Signed)
History and Physical Interval Note:  11/12/2014 7:08 AM  Arie Sabina  has presented today for surgery, with the diagnosis of duodenal mass  The various methods of treatment have been discussed with the patient and family. After consideration of risks, benefits and other options for treatment, the patient has consented to  Procedure(s): UPPER ENDOSCOPIC ULTRASOUND (EUS) LINEAR (N/A) as a surgical intervention .  The patient's history has been reviewed, patient examined, no change in status, stable for surgery.  I have reviewed the patient's chart and labs.  Questions were answered to the patient's satisfaction.     Milus Banister

## 2014-11-12 NOTE — Transfer of Care (Signed)
Immediate Anesthesia Transfer of Care Note  Patient: [name] [name]  Procedure(s) Performed: Procedure(s): UPPER ENDOSCOPIC ULTRASOUND (EUS) LINEAR (N/A)  Patient Location: ENDO  Anesthesia Type:MAC  Level of Consciousness:  sedated, patient cooperative and responds to stimulation  Airway & Oxygen Therapy:Patient Spontanous Breathing and Patient connected to face mask oxgen  Post-op Assessment:  Report given to ENDO RN and Post -op Vital signs reviewed and stable  Post vital signs:  Reviewed and stable  Last Vitals:  Filed Vitals:   11/12/14 0629  BP: 134/84  Pulse: 73  Temp: 36.4 C  Resp: 11    Complications: No apparent anesthesia complications

## 2014-11-12 NOTE — Op Note (Signed)
[facility] [place], [address]   ENDOSCOPIC ULTRASOUND PROCEDURE REPORT  PATIENT: [name] [name], [name] [name]  MR#: [id number] BIRTHDATE: 09-07-1950  GENDER: female ENDOSCOPIST: Milus Banister, MD REFERRED BY:  Ralene Ok, MD PROCEDURE DATE:  11/12/2014 PROCEDURE:   Upper EUS ASA CLASS:      Class III INDICATIONS:   1.  intermittent RUQ pains, abnormal GB on [place], normal HIDA, CT suggested filling defect in duodenum; EGD Dr.  Olevia Perches last month was essentially normal. MEDICATIONS: Monitored anesthesia care  DESCRIPTION OF PROCEDURE:   After the risks benefits and alternatives of the procedure were  explained, informed consent was obtained. The patient was then placed in the left, lateral, decubitus postion and IV sedation was administered. Throughout the procedure, the patients blood pressure, pulse and oxygen saturations were monitored continuously.  Under direct visualization, the Pentax Radial EUS [id number]  endoscope was introduced through the mouth  and advanced to the second portion of the duodenum .  Water was used as necessary to provide an acoustic interface.  Upon completion of the imaging, water was removed and the patient was sent to the recovery room in satisfactory condition.  Endoscopic findings: 1. Normal UGI tract, including normal duodenum  EUS findings: 1. Hypoehcoic, solid, 8.39mm round mass that clearly communicates with the muscularis propria layer of the lateral wall of the distal duodenum. The lesion is clearly demarcated. 2. No paraduodenal adenopathy 3. Gallbladder contains several echogenic but non-shadowing stones (vs sludge). 4. CBD was normal 5. Limited views of liver, pancreas, spleen, portal and splenic vessels were all normal.  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 This is very unlikely to be contributing to her symptoms and at this size (<2cm) there is no  need for surgical resection. I recommend proceeding with her cholecystectomy for symptomatic stones (vs sludge) in GB.  I will plan to repeat EUS in 18 months to check for interval changes.   _______________________________ eSignedMilus Banister, MD 11/12/2014 8:00 AM

## 2014-11-12 NOTE — Discharge Instructions (Signed)

## 2014-11-12 NOTE — Anesthesia Postprocedure Evaluation (Signed)
  Anesthesia Post-op Note  Patient: [name] [name]  Procedure(s) Performed: Procedure(s) (LRB): UPPER ENDOSCOPIC ULTRASOUND (EUS) LINEAR (N/A)  Patient Location: PACU  Anesthesia Type: MAC  Level of Consciousness: awake and alert   Airway and Oxygen Therapy: Patient Spontanous Breathing  Post-op Pain: mild  Post-op Assessment: Post-op Vital signs reviewed, Patient's Cardiovascular Status Stable, Respiratory Function Stable, Patent Airway and No signs of Nausea or vomiting  Last Vitals:  Filed Vitals:   11/12/14 0820  BP: 154/98  Pulse: 64  Temp:   Resp: 12    Post-op Vital Signs: stable   Complications: No apparent anesthesia complications

## 2014-11-13 ENCOUNTER — Encounter (HOSPITAL_COMMUNITY): Payer: Self-pay | Admitting: Gastroenterology

## 2014-11-23 ENCOUNTER — Ambulatory Visit: Payer: Medicare Other | Admitting: Pulmonary Disease

## 2014-11-23 DIAGNOSIS — K8020 Calculus of gallbladder without cholecystitis without obstruction: Secondary | ICD-10-CM | POA: Diagnosis not present

## 2014-11-25 ENCOUNTER — Institutional Professional Consult (permissible substitution): Payer: Medicare Other | Admitting: Pulmonary Disease

## 2014-12-01 ENCOUNTER — Ambulatory Visit: Payer: Medicare Other | Admitting: Pulmonary Disease

## 2014-12-27 ENCOUNTER — Other Ambulatory Visit: Payer: Self-pay | Admitting: Family Medicine

## 2014-12-28 ENCOUNTER — Ambulatory Visit: Payer: Medicare Other | Admitting: Cardiovascular Disease

## 2014-12-29 NOTE — Telephone Encounter (Signed)
Call in #[phone] with 5 rf 

## 2014-12-30 ENCOUNTER — Other Ambulatory Visit: Payer: Self-pay | Admitting: Cardiovascular Disease

## 2015-01-18 ENCOUNTER — Ambulatory Visit (INDEPENDENT_AMBULATORY_CARE_PROVIDER_SITE_OTHER): Payer: Medicare Other | Admitting: Cardiovascular Disease

## 2015-01-18 ENCOUNTER — Encounter: Payer: Self-pay | Admitting: Cardiovascular Disease

## 2015-01-18 VITALS — BP 130/80 | HR 61 | Ht 61.0 in | Wt 159.0 lb

## 2015-01-18 DIAGNOSIS — I10 Essential (primary) hypertension: Secondary | ICD-10-CM

## 2015-01-18 DIAGNOSIS — I493 Ventricular premature depolarization: Secondary | ICD-10-CM | POA: Diagnosis not present

## 2015-01-18 DIAGNOSIS — I25119 Atherosclerotic heart disease of native coronary artery with unspecified angina pectoris: Secondary | ICD-10-CM | POA: Diagnosis not present

## 2015-01-18 DIAGNOSIS — Z720 Tobacco use: Secondary | ICD-10-CM | POA: Diagnosis not present

## 2015-01-18 MED ORDER — ASPIRIN EC 81 MG PO TBEC: 81.0000 mg | DELAYED_RELEASE_TABLET | Freq: Every day | ORAL | Status: AC

## 2015-01-18 MED ORDER — METOPROLOL TARTRATE 50 MG PO TABS: ORAL_TABLET | ORAL | Status: DC

## 2015-01-18 MED ORDER — FLECAINIDE ACETATE 100 MG PO TABS: 100.0000 mg | ORAL_TABLET | Freq: Two times a day (BID) | ORAL | Status: DC

## 2015-01-18 MED ORDER — NITROGLYCERIN 0.4 MG SL SUBL
0.4000 mg | SUBLINGUAL_TABLET | SUBLINGUAL | Status: DC | PRN
Start: 2015-01-18 — End: 2015-05-06

## 2015-01-18 NOTE — Patient Instructions (Signed)
Medication Instructions:  Your physician has recommended you make the following change in your medication:  Increase flecainide to 100 mg by mouth twice daily. Start aspirin 81 mg by mouth daily    Labwork: none  Testing/Procedures: none  Follow-Up: Your physician wants you to follow-up in: 6 months.  You will receive a reminder letter in the mail two months in advance. If you don't receive a letter, please call our office to schedule the follow-up appointment.   Any Other Special Instructions Will Be Listed Below (If Applicable).

## 2015-01-18 NOTE — Progress Notes (Signed)
/   Chief Complaint  Patient presents with  . Shortness of Breath     History of Present Illness: 64 yo female with history of HTN, HLD, GERD, OA, depression, ongoing tobacco abuse, palpitations here today for cardiac follow up. I saw her as a new patient 08/12/13 for cardiovascular examination and risk assessment. She told me that she has been on a beta blocker for years. She had been on metoprolol in the past and was on Propranolol when I met her. She c/o episodes of weakness and felt that her heart rate was slow. She also described dyspnea on exertion. I arranged an echo on 09/03/13 which showed normal LV size and function, possible bicuspid aortic valve, mildly dilated aortic root with possible dissection flap in aorta. CTA chest with no evidence of aortic dissection, thoracic aorta 3.7 cm. Coronary calcification noted. Event monitor showed sinus rhythm with PVCs, bigeminy. Inderal changed to metoprolol. Given ongoing dyspnea and chest pressure, I arranged a right and left heart cath on 04/22/14. She was found to have a moderate mid LAD stenosis (50-60% stenosis). FFR was 0.82-0.85 suggesting this stenosis was not flow limiting. The Circumflex and RCA were free of significant disease. I saw her in March 2016 and she c/o palpitations associated with dizziness and weakness, bradycardia. She was seen by Dr. Rayann Heman and was started on Flecainide. Her PVC burden did improve with this.   She is here today for follow up. She is doing well overall. She feels much better on the Flecainide. She does think that it wears off after 5 hours. She has been diagnosed with COPD but she is still smoking but only 1/4 ppd. She is having rare chest pains at night, last for several minutes. She is having issues with her gallbladder and needs to have cholecystectomy.   Primary Care Physician: Sharlene Motts  Past Medical History  Diagnosis Date  . Depression   . GERD (gastroesophageal reflux disease)   . Allergic rhinitis   .  Headache(784.0)   . Hyperlipidemia   . Hypertension   . Insomnia   . Dexamethasone adverse reaction     2002 normal  . Allergy     seasonal  . Anxiety   . Cataract   . Ulcer     duodenal  . Premature ventricular contractions     LBBB inferior axis PVCs  . Tobacco abuse   . Migraines   . COPD (chronic obstructive pulmonary disease) ([place])   . Overactive bladder   . Osteoarthritis     Past Surgical History  Procedure Laterality Date  . Tonsillectomy    . Carpal tunnel release    . Torn cartilage repaired rt wrist  2003  . Colonoscopy  10-17-13    per Dr. Olevia Perches, benign polyps, repeat in 10 years  . Left and right heart catheterization with coronary angiogram N/A 04/22/2014    Procedure: LEFT AND RIGHT HEART CATHETERIZATION WITH CORONARY ANGIOGRAM;  Surgeon: Burnell Blanks, MD;  Location: [facility] CATH LAB;  Service: Cardiovascular;  Laterality: N/A;  . Eus N/A 11/12/2014    Procedure: UPPER ENDOSCOPIC ULTRASOUND (EUS) LINEAR;  Surgeon: Milus Banister, MD;  Location: [facility] ENDOSCOPY;  Service: Endoscopy;  Laterality: N/A;    Current Outpatient Prescriptions  Medication Sig Dispense Refill  . albuterol (PROVENTIL HFA;VENTOLIN HFA) 108 (90 BASE) MCG/ACT inhaler Inhale 2 puffs into the lungs every 6 (six) hours as needed for wheezing. 1 Inhaler 1  . ALPRAZolam (XANAX) 0.25 MG tablet TAKE 3 TABLETS BY  MOUTH EVERY 6 HOURS AS NEEDED FOR ANXIETY 360 tablet 5  . atorvastatin (LIPITOR) 20 MG tablet TAKE 1 TABLET BY MOUTH EVERY DAY 30 tablet 11  . budesonide-formoterol (SYMBICORT) 160-4.5 MCG/ACT inhaler Inhale 2 puffs into the lungs 2 (two) times daily. 1 Inhaler 6  . buPROPion (WELLBUTRIN XL) 150 MG 24 hr tablet Take 1 tablet (150 mg total) by mouth daily. 90 tablet 3  . cetirizine (ZYRTEC) 10 MG tablet Take 10 mg by mouth daily as needed for allergies.     . flecainide (TAMBOCOR) 100 MG tablet Take 1 tablet (100 mg total) by mouth 2 (two) times daily. 180 tablet 3  . hydrochlorothiazide  (HYDRODIURIL) 25 MG tablet Take 1 tablet (25 mg total) by mouth daily. 90 tablet 3  . HYDROcodone-acetaminophen (NORCO/VICODIN) 5-325 MG per tablet Take 1 tablet by mouth every 6 (six) hours as needed for moderate pain. 120 tablet 0  . ibuprofen (ADVIL,MOTRIN) 200 MG tablet Take 400 mg by mouth every 6 (six) hours as needed for moderate pain.     Marland Kitchen lisinopril (PRINIVIL,ZESTRIL) 20 MG tablet Take 1 tablet (20 mg total) by mouth daily. 90 tablet 3  . metoprolol (LOPRESSOR) 50 MG tablet TAKE 1 TABLET (50 MG TOTAL) BY MOUTH 2 (TWO) TIMES DAILY. 180 tablet 3  . ondansetron (ZOFRAN) 4 MG tablet Take 1 tablet (4 mg total) by mouth every 6 (six) hours as needed for nausea or vomiting. 20 tablet 0  . pantoprazole (PROTONIX) 40 MG tablet Take 1 tablet (40 mg total) by mouth 2 (two) times daily. 60 tablet 3  . traMADol (ULTRAM) 50 MG tablet Take 1-2 tablets (50-100 mg total) by mouth every 6 (six) hours as needed. 60 tablet 1  . aspirin EC 81 MG tablet Take 1 tablet (81 mg total) by mouth daily. 90 tablet 3  . nitroGLYCERIN (NITROSTAT) 0.4 MG SL tablet Place 1 tablet (0.4 mg total) under the tongue every 5 (five) minutes as needed for chest pain. 25 tablet 6   No current facility-administered medications for this visit.    Allergies  Allergen Reactions  . Dexamethasone Other (See Comments)    Pt thinks they used it during surgery and she quit breathing, not sure this was the exact medication.  . Clindamycin/Lincomycin Nausea And Vomiting  . Oxycodone-Acetaminophen Nausea And Vomiting    Social History   Social History  . Marital Status: Single    Spouse Name: N/A  . Number of Children: 0  . Years of Education: N/A   Occupational History  . CNA    Social History Main Topics  . Smoking status: Current Every Day Smoker -- 0.25 packs/day for 52 years    Types: Cigarettes  . Smokeless tobacco: Never Used     Comment: 1/2 pack per day or less  . Alcohol Use: No  . Drug Use: No  . Sexual  Activity: Not on file   Other Topics Concern  . Not on file   Social History Narrative   Works as Quarry manager at [employer].    Family History  Problem Relation Age of Onset  . Hyperlipidemia    . Hypertension    . Kidney disease    . Heart disease    . CAD Brother   . Kidney disease Brother   . Emphysema Mother   . Colon cancer Neg Hx   . Esophageal cancer Neg Hx   . Stomach cancer Neg Hx   . Rectal cancer Neg Hx   .  Other Father     brain tumor  . Throat cancer Brother     Review of Systems:  As stated in the HPI and otherwise negative.   BP 130/80 mmHg  Pulse 61  Ht _0  (1.549 m)  Wt 159 lb (72.122 kg)  BMI 30.06 kg/m2  SpO2 98%  Physical Examination: General: Well developed, well nourished, NAD HEENT: OP clear, mucus membranes moist SKIN: warm, dry. No rashes. Neuro: No focal deficits Musculoskeletal: Muscle strength 5/5 all ext Psychiatric: Mood and affect normal Neck: No JVD, no carotid bruits, no thyromegaly, no lymphadenopathy. Lungs:Clear bilaterally, no wheezes, rhonci, crackles Cardiovascular: Regular rate and rhythm. No murmurs, gallops or rubs. Abdomen:Soft. Bowel sounds present. Non-tender.  Extremities: No lower extremity edema. Pulses are 2 + in the bilateral DP/PT.  Echo 09/03/13: Left ventricle: The cavity size was normal. Systolic function was normal. The estimated ejection fraction was in the range of 60% to 65%. Wall motion was normal; there were no regional wall motion abnormalities. - Aortic valve: Possibly bicuspid; normal thickness leaflets. - Aorta: Dilated aortic root and ascending aorta measuring 40 mm. - Mitral valve: Structurally normal valve. There was no regurgitation. - Left atrium: The atrium was normal in size. - Right ventricle: The cavity size was normal. Wall thickness was normal. - Right atrium: The atrium was normal in size. - Tricuspid valve: Structurally normal valve. There was no regurgitation. - Pulmonic valve:  Structurally normal valve. - Inferior vena cava: The vessel was normal in size. - Pericardium, extracardiac: There was no pericardial effusion. Impressions: - Dilated aortic root and ascending aorta measuring 40 mm. There is possible dissection flap in the ascending aorta that needs to be further evaluated by either TEE or CTA.  Chest CTA 09/29/13:  1. Prominent ascending aorta measuring 3.7 cm. 2. Thoracic aorta atherosclerosis without dissection. 3. Age advanced coronary artery atherosclerosis. Recommend assessment of coronary risk factors and consideration of medical therapy. 4. Faint airspace disease especially involving the upper lobes, consistent with alveolitis. Correlation is recommended with pulmonary symptoms. 5. Small right thyroid nodule. No further evaluation is felt to be necessary. This follows ACR consensus guidelines: Managing Incidental Thyroid Nodules Detected on Imaging: White Paper of the ACR Incidental Thyroid Findings Committee. J Am Coll Radiol 2015; 12:143-150. 6. Possible adrenal hyperplasia.  Cardiac cath 04/22/14: Hemodynamic Findings: Ao: 134/74  LV: 132/8/18 RA: 1  RV: 38/4/8 PA: 33/12 (mean 21) PCWP: 4 Fick Cardiac Output: 3.8 L/min Fick Cardiac Index: 2.19 L/min/m2 Central Aortic Saturation: 99% Pulmonary Artery Saturation: 69%  Angiographic Findings: Left main: No obstructive disease Left Anterior Descending Artery: Large caliber vessel that courses to the apex. The proximal vessel has diffuse 20% stenosis. The mid vessel has a 50-60% stenosis. There is a small septal branch that arises from this plaque with ostial 90% stenosis. Several small caliber diagonal branches.  Circumflex Artery: Large caliber vessel with large caliber first obtuse marginal branch. No obstructive disease.  Right Coronary Artery: Large dominant vessel with proximal and mid calcification, diffuse 20% stenosis in the proximal and mid vessel.    Left Ventricular Angiogram: LVEF=55-60%.  Impression: 1. Moderate non-obstructive CAD 2. Moderate mid LAD stenosis with FFR borderline at 0.82.  3. Normal LV systolic function  EKG:  EKG is not ordered today. The ekg ordered today demonstrates   Recent Labs: 09/08/2014: TSH 1.34 10/07/2014: ALT 10*; BUN 13; Creatinine, Ser 0.92; Hemoglobin 15.4*; Platelets 283; Potassium 3.7; Sodium 138   Lipid Panel    Component Value  Date/Time   CHOL 134 09/08/2014 0958   TRIG 96.0 09/08/2014 0958   HDL 49.70 09/08/2014 0958   CHOLHDL 3 09/08/2014 0958   VLDL 19.2 09/08/2014 0958   LDLCALC 65 09/08/2014 0958   LDLDIRECT 130.9 11/16/2008 1013     Wt Readings from Last 3 Encounters:  01/18/15 159 lb (72.122 kg)  11/12/14 157 lb (71.215 kg)  10/23/14 158 lb (71.668 kg)     Other studies Reviewed: Additional studies/ records that were reviewed today include: . Review of the above records demonstrates:    Assessment and Plan:   1. Palpitations/PVCs: She is on Flecainide.  It seems to be wearing off after 5 hours. Will increase to 100 mg BID.   2. CAD: Stable angina. Moderate LAD stenosis by cath 04/22/14. FFR of the LAD showed that this lesion is not flow limiting. Will continue beta blocker and statin.. Will start ASA 81 mg daily. Will give her NTG to use prn.   3. Tobacco abuse: Smoking cessation is encouraged. She does not wish to stop. Counseling given on cessation.   4. Bicuspid aortic valve: No aortic stenosis at this time. Will follow.   Current medicines are reviewed at length with the patient today.  The patient does not have concerns regarding medicines.  The following changes have been made:    Labs/ tests ordered today include:  No orders of the defined types were placed in this encounter.    Disposition:   FU with me in 6 months  Signed, Lauree Chandler, MD 01/18/2015 12:21 PM    [facility] [address], [place], [place]   [address] Phone: [phone]; Fax: [phone]

## 2015-01-19 ENCOUNTER — Telehealth: Payer: Self-pay | Admitting: Cardiovascular Disease

## 2015-01-19 NOTE — Telephone Encounter (Signed)
I spoke with the patient. She saw Dr. Angelena Form yesterday and her flecainide dose was increased to 100 mg BID. She took her HR this morning on the higher dose and it was 41 sitting and 59 standing. She is asymptomatic currently. I advised her that typically we like her HR's to be in the 60-70 range, but if she is in the mid to upper 50's and asymptomatic, then that is acceptable. I have advised her to continue to monitor over the next few days and call back Friday if she is running in the 40's consistently or to call sooner if she starts to develop symptoms.  She is agreeable and voices understanding.

## 2015-01-19 NOTE — Telephone Encounter (Signed)
NewMessage  Pt was seen 10/17 w/ Dr Angelena Form and was told to record HR. Pt wanted to know how low her HR should get before she needs to call our office or go to ED. Please call back and discuss.

## 2015-01-22 ENCOUNTER — Telehealth: Payer: Self-pay | Admitting: Cardiovascular Disease

## 2015-01-22 NOTE — Telephone Encounter (Signed)
New Message       Pt calling stating that she is having issues with her heart rate. Pt states when she is laying down it is between 29-33, when she sits up it is around 40, and when standing it is only 63. Please call back and advise.

## 2015-01-22 NOTE — Telephone Encounter (Signed)
Called the patient. She went to take a nap before work this evening and realized she felt really weak and zero energy Checked her heart rate while lying down.  It was 29. After walking to BR it was up briefly to 60 then back to 33, stayed between 40s-50s.  She checked during our phone call and her heart rate stayed around 55 the whole time.

## 2015-01-22 NOTE — Telephone Encounter (Signed)
Spoke with Dr. Burt Knack, DOD Rec. Decreasing flecainide dose back to 50 mg twice daily but do not take any tonight. Per Dr. Burt Knack informed patient if she would like to come for an ekg today that would be ok with him. She works nightshift and hasn't slept yet so she will not do that but will call back on Monday if her HR is still slow and if she feels bad. Also advised that per Dr. Burt Knack if she is having PVCs sometimes the monitor wont read those and so her HR appears slower than it actually is. Pt states she thinks Dr. Angelena Form told her that.  She will call back on Monday if she is still having slow HR, symptoms.

## 2015-01-22 NOTE — Telephone Encounter (Signed)
Thanks. Agree. cdm 

## 2015-01-24 ENCOUNTER — Other Ambulatory Visit: Payer: Self-pay | Admitting: Family Medicine

## 2015-01-27 ENCOUNTER — Other Ambulatory Visit: Payer: Self-pay | Admitting: Family Medicine

## 2015-02-18 ENCOUNTER — Other Ambulatory Visit: Payer: Self-pay | Admitting: Family Medicine

## 2015-02-18 NOTE — Telephone Encounter (Signed)
Pt requesting refill of HYDROcodone-acetaminophen (NORCO/VICODIN) 5-325 MG per tablet 

## 2015-02-19 MED ORDER — HYDROCODONE-ACETAMINOPHEN 5-325 MG PO TABS: 1.0000 | ORAL_TABLET | Freq: Four times a day (QID) | ORAL | Status: DC | PRN

## 2015-02-19 NOTE — Telephone Encounter (Signed)
done

## 2015-02-19 NOTE — Telephone Encounter (Signed)
Called and spoke with pt and pt is aware rx is ready for pick up.  Pt will come on Monday. Pt scheduled for flu shot as well.

## 2015-02-22 ENCOUNTER — Ambulatory Visit (INDEPENDENT_AMBULATORY_CARE_PROVIDER_SITE_OTHER): Payer: Medicare Other

## 2015-02-22 DIAGNOSIS — Z23 Encounter for immunization: Secondary | ICD-10-CM | POA: Diagnosis not present

## 2015-03-02 ENCOUNTER — Ambulatory Visit (INDEPENDENT_AMBULATORY_CARE_PROVIDER_SITE_OTHER): Payer: Medicare Other | Admitting: Family Medicine

## 2015-03-02 ENCOUNTER — Encounter: Payer: Self-pay | Admitting: Family Medicine

## 2015-03-02 VITALS — BP 132/88 | HR 65 | Temp 97.5°F | Ht 61.0 in | Wt 160.0 lb

## 2015-03-02 DIAGNOSIS — S300XXA Contusion of lower back and pelvis, initial encounter: Secondary | ICD-10-CM

## 2015-03-02 DIAGNOSIS — S1093XA Contusion of unspecified part of neck, initial encounter: Secondary | ICD-10-CM | POA: Diagnosis not present

## 2015-03-02 DIAGNOSIS — S20229A Contusion of unspecified back wall of thorax, initial encounter: Secondary | ICD-10-CM | POA: Diagnosis not present

## 2015-03-02 MED ORDER — HYDROCODONE-ACETAMINOPHEN 10-325 MG PO TABS: 1.0000 | ORAL_TABLET | Freq: Four times a day (QID) | ORAL | Status: DC | PRN

## 2015-03-02 NOTE — Progress Notes (Signed)
   Subjective:    Patient ID: [name] [name], female    DOB: 07/21/1950, 64 y.o.   MRN: [id number]  HPI Here for injuries that occurred at home 36 hours ago. She was standing on a step ladder changing a filter when she lost her balance and fell backwards. She first landed on her bottom, then her middle back struck the floor, and finally her head struck the floor. The floor was carpeted. There was no LOC. She has no nausea or blurred vision or HA. She does have stiffness and pain in the back of the neck, the middle back, and the lower back. She has chronic low back pain and she takes Norco 5 mg for that. However since she fell this is not enough to help with the pain.    Review of Systems  Constitutional: Negative.   Respiratory: Negative.   Cardiovascular: Negative.   Musculoskeletal: Positive for back pain, neck pain and neck stiffness. Negative for gait problem.  Neurological: Negative.        Objective:   Physical Exam  Constitutional: She is oriented to person, place, and time. She appears well-developed and well-nourished. No distress.  Cardiovascular: Normal rate, regular rhythm, normal heart sounds and intact distal pulses.   Pulmonary/Chest: Effort normal and breath sounds normal.  Musculoskeletal:  She is mildly tender in the posterior neck with full ROM of the neck. She is mildly tender in the middle thoracic area with some spasm, but ROM is full. She is tender over the lower back with full extension, but flexion is limited by pain. Negative SLR  Neurological: She is alert and oriented to person, place, and time. She has normal reflexes. No cranial nerve deficit. She exhibits normal muscle tone. Coordination normal.          Assessment & Plan:  Contusions to the neck, middle back and lower back. Use moist heat. Wrote for a small supply of 10 mg Norco to use for pain. Recheck prn

## 2015-03-02 NOTE — Progress Notes (Signed)
Pre visit review using our clinic review tool, if applicable. No additional management support is needed unless otherwise documented below in the visit note. 

## 2015-03-03 ENCOUNTER — Encounter: Payer: Self-pay | Admitting: Internal Medicine

## 2015-03-03 ENCOUNTER — Ambulatory Visit (INDEPENDENT_AMBULATORY_CARE_PROVIDER_SITE_OTHER): Payer: Medicare Other | Admitting: Internal Medicine

## 2015-03-03 VITALS — BP 110/60 | HR 62 | Ht 61.0 in | Wt 161.8 lb

## 2015-03-03 DIAGNOSIS — I493 Ventricular premature depolarization: Secondary | ICD-10-CM

## 2015-03-03 DIAGNOSIS — F17200 Nicotine dependence, unspecified, uncomplicated: Secondary | ICD-10-CM

## 2015-03-03 DIAGNOSIS — I10 Essential (primary) hypertension: Secondary | ICD-10-CM

## 2015-03-03 MED ORDER — FLECAINIDE ACETATE 100 MG PO TABS: 50.0000 mg | ORAL_TABLET | Freq: Three times a day (TID) | ORAL | Status: DC

## 2015-03-03 NOTE — Patient Instructions (Signed)
Medication Instructions:  Your physician has recommended you make the following change in your medication:  1) Increase Flecainide to 50 mg three times daily   Labwork: None ordered   Testing/Procedures: None ordered   Follow-Up: Your physician recommends that you schedule a follow-up appointment in: 6 weeks with Chanetta Marshall, NP   Any Other Special Instructions Will Be Listed Below (If Applicable).     If you need a refill on your cardiac medications before your next appointment, please call your pharmacy.

## 2015-03-04 NOTE — Progress Notes (Signed)
Electrophysiology Office Note   Date:  03/04/2015   ID:  [name] [name], DOB 11-09-1950, MRN [id number]  PCP:  [name], MD  Cardiologist:  Dr Angelena Form Primary Electrophysiologist: Thompson Grayer, MD    Chief Complaint  Patient presents with  . PVCs     History of Present Illness: [name] [name] is a 64 y.o. female who presents today for electrophysiology follow-up.   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  With nadolol, episodes have improved but not resolved.  She is unware of triggers or precipitants.    She also has dyspnea on exertion.  She is a heavy smoker.  Echo on 09/03/13 showed normal LV size and function, possible bicuspid aortic valve, mildly dilated aortic root with possible dissection flap in aorta. CTA chest with no evidence of aortic dissection, thoracic aorta 3.7 cm. Coronary calcification noted. Event monitor showed sinus rhythm with PVCs, bigeminy. Inderal changed to metoprolol.  She is s/p heart cath on 04/22/14. She was found to have a moderate mid LAD stenosis (50-60% stenosis). FFR was 0.82-0.85 suggesting this stenosis was not flow limiting. The Circumflex and RCA were free of significant disease. She was placed on flecainide with some improvement but not resolution of PVCs.  She did note tolerate 100mg  BID very well and has reduced her dose to 50mg  BID.  Today, she denies symptoms of  chest pain,   orthopnea, PND, lower extremity edema, claudication, dizziness, presyncope, syncope, bleeding, or neurologic sequela. The patient is tolerating medications without difficulties and is otherwise without complaint today.    Past Medical History  Diagnosis Date  . Depression   . GERD (gastroesophageal reflux disease)   . Allergic rhinitis   . Headache(784.0)   . Hyperlipidemia   . Hypertension   . Insomnia   . Dexamethasone adverse reaction     2002 normal  . Allergy     seasonal  . Anxiety   . Cataract   . Ulcer     duodenal  .  Premature ventricular contractions     LBBB inferior axis PVCs  . Tobacco abuse   . Migraines   . COPD (chronic obstructive pulmonary disease) ([place])   . Overactive bladder   . Osteoarthritis    Past Surgical History  Procedure Laterality Date  . Tonsillectomy    . Carpal tunnel release    . Torn cartilage repaired rt wrist  2003  . Colonoscopy  10-17-13    per Dr. Olevia Perches, benign polyps, repeat in 10 years  . Left and right heart catheterization with coronary angiogram N/A 04/22/2014    Procedure: LEFT AND RIGHT HEART CATHETERIZATION WITH CORONARY ANGIOGRAM;  Surgeon: Burnell Blanks, MD;  Location: [facility] CATH LAB;  Service: Cardiovascular;  Laterality: N/A;  . Eus N/A 11/12/2014    Procedure: UPPER ENDOSCOPIC ULTRASOUND (EUS) LINEAR;  Surgeon: Milus Banister, MD;  Location: [facility] ENDOSCOPY;  Service: Endoscopy;  Laterality: N/A;     Current Outpatient Prescriptions  Medication Sig Dispense Refill  . albuterol (PROVENTIL HFA;VENTOLIN HFA) 108 (90 BASE) MCG/ACT inhaler Inhale 2 puffs into the lungs every 6 (six) hours as needed for wheezing. 1 Inhaler 1  . ALPRAZolam (XANAX) 0.25 MG tablet TAKE 3 TABLETS BY MOUTH EVERY 6 HOURS AS NEEDED FOR ANXIETY 360 tablet 5  . aspirin EC 81 MG tablet Take 1 tablet (81 mg total) by mouth daily. 90 tablet 3  . atorvastatin (LIPITOR) 20 MG tablet TAKE 1 TABLET BY MOUTH EVERY  DAY 30 tablet 11  . budesonide-formoterol (SYMBICORT) 160-4.5 MCG/ACT inhaler Inhale 2 puffs into the lungs 2 (two) times daily. 1 Inhaler 6  . buPROPion (WELLBUTRIN XL) 150 MG 24 hr tablet Take 1 tablet (150 mg total) by mouth daily. 90 tablet 3  . cetirizine (ZYRTEC) 10 MG tablet Take 10 mg by mouth daily as needed for allergies.     . flecainide (TAMBOCOR) 100 MG tablet Take 0.5 tablets (50 mg total) by mouth 3 (three) times daily. 135 tablet 3  . hydrochlorothiazide (HYDRODIURIL) 25 MG tablet Take 1 tablet (25 mg total) by mouth daily. 90 tablet 3  . HYDROcodone-acetaminophen  (NORCO) 10-325 MG tablet Take 1 tablet by mouth every 6 (six) hours as needed for severe pain. 60 tablet 0  . HYDROcodone-acetaminophen (NORCO/VICODIN) 5-325 MG tablet Take 1 tablet by mouth every 6 (six) hours as needed for moderate pain. 120 tablet 0  . ibuprofen (ADVIL,MOTRIN) 200 MG tablet Take 400 mg by mouth every 6 (six) hours as needed for moderate pain.     Marland Kitchen lisinopril (PRINIVIL,ZESTRIL) 20 MG tablet Take 1 tablet (20 mg total) by mouth daily. 90 tablet 3  . metoprolol (LOPRESSOR) 50 MG tablet TAKE 1 TABLET (50 MG TOTAL) BY MOUTH 2 (TWO) TIMES DAILY. 180 tablet 3  . nitroGLYCERIN (NITROSTAT) 0.4 MG SL tablet Place 1 tablet (0.4 mg total) under the tongue every 5 (five) minutes as needed for chest pain. 25 tablet 6  . ondansetron (ZOFRAN) 4 MG tablet Take 1 tablet (4 mg total) by mouth every 6 (six) hours as needed for nausea or vomiting. 20 tablet 0  . pantoprazole (PROTONIX) 40 MG tablet TAKE 1 TABLET (40 MG TOTAL) BY MOUTH 2 (TWO) TIMES DAILY. 60 tablet 6  . traMADol (ULTRAM) 50 MG tablet Take 50-100 mg by mouth every 6 (six) hours as needed (pain).     No current facility-administered medications for this visit.    Allergies:   Dexamethasone; Clindamycin/lincomycin; and Oxycodone-acetaminophen   Social History:  The patient  reports that she has been smoking Cigarettes.  She has a 13 pack-year smoking history. She has never used smokeless tobacco. She reports that she does not drink alcohol or use illicit drugs.   Family History:  The patient's  family history includes CAD in her brother; Emphysema in her mother; Heart disease in an other family member; Hyperlipidemia in an other family member; Hypertension in an other family member; Kidney disease in her brother and another family member; Other in her father; Throat cancer in her brother. There is no history of Colon cancer, Esophageal cancer, Stomach cancer, or Rectal cancer.    ROS:  Please see the history of present illness.    All other systems are reviewed and negative.    PHYSICAL EXAM: VS:  BP 110/60 mmHg  Pulse 62  Ht 5\' 1"  (1.549 m)  Wt 161 lb 12.8 oz (73.392 kg)  BMI 30.59 kg/m2 , BMI Body mass index is 30.59 kg/(m^2). GEN: Well nourished, well developed, in no acute distress, smells heavily of tobacco HEENT: normal, poor dentition, Neck: no JVD, carotid bruits, or masses Cardiac: RRR with frequent ectopy; no murmurs, rubs, or gallops,no edema  Respiratory:  clear to auscultation bilaterally, normal work of breathing GI: soft, nontender, nondistended, + BS MS: no deformity or atrophy Skin: warm and dry  Neuro:  Strength and sensation are intact Psych: euthymic mood, full affect  EKG:  EKG is ordered today. The ekg ordered today shows sinus rhythm  today.   Recent Labs: 09/08/2014: TSH 1.34 10/07/2014: ALT 10*; BUN 13; Creatinine, Ser 0.92; Hemoglobin 15.4*; Platelets 283; Potassium 3.7; Sodium 138    Lipid Panel     Component Value Date/Time   CHOL 134 09/08/2014 0958   TRIG 96.0 09/08/2014 0958   HDL 49.70 09/08/2014 0958   CHOLHDL 3 09/08/2014 0958   VLDL 19.2 09/08/2014 0958   LDLCALC 65 09/08/2014 0958   LDLDIRECT 130.9 11/16/2008 1013     Wt Readings from Last 3 Encounters:  03/03/15 161 lb 12.8 oz (73.392 kg)  03/02/15 160 lb (72.576 kg)  01/18/15 159 lb (72.122 kg)      ASSESSMENT AND PLAN:  1.  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  She is quite symptomatic with these. Unfortunately, they have been too infrequent for ablation.  At this time, we will try to increase flecainide to 50mg  TID.  We will consider propafenone 150mg  TID if she does not find flecainide to be helpful.  Ultimately, we may not have any real options for her PVCs.  2. Tobacco Cessation encouraged today  3. HTN Stable No change required today  Current medicines are reviewed at length with the patient today.   The patient does not have concerns regarding her medicines.  The  following changes were made today:  none  Follow-up with EP NP in 6 weeks.  Army Fossa, MD  03/04/2015 9:51 PM     [address] [place] [address] [phone] (office) [phone] (fax)

## 2015-03-09 ENCOUNTER — Ambulatory Visit: Payer: Self-pay | Admitting: General Surgery

## 2015-03-09 DIAGNOSIS — K8020 Calculus of gallbladder without cholecystitis without obstruction: Secondary | ICD-10-CM | POA: Diagnosis not present

## 2015-03-09 NOTE — H&P (Signed)
History of Present Illness [name] Ok MD; 03/09/2015 10:32 AM) The patient is a 64 year old female who presents for evaluation of gall stones. The patient is a 64 year old female with history of gallstones. Patient was seen last in August of this year. She states that since that time she is continue with constant right upper quadrant pain. She states it does not change in regards to the change in her diet.  Patient previously has had a cyst and duodenum which is felt to be benign.  The patient sees Drs. McElrhaney and Allred. Patient has a history of lower back pain is currently on Norco 10/325.   Allergies Elbert Ewings, CMA; 03/09/2015 10:19 AM) Dexamethasone (Ophth) *OPHTHALMIC AGENTS* OxyCODONE HCl *ANALGESICS - OPIOID*  Medication History Elbert Ewings, CMA; 03/09/2015 10:19 AM) Hydrocodone-Acetaminophen (5-325MG  Tablet, Oral) Active. ALPRAZolam (0.25MG  Tablet, Oral) Active. Symbicort (160-4.5MCG/ACT Aerosol, Inhalation) Active. PredniSONE (10MG  Tablet, Oral) Active. TraMADol HCl (50MG  Tablet, Oral) Active. Atorvastatin Calcium (20MG  Tablet, Oral) Active. BuPROPion HCl ER (XL) (150MG  Tablet ER 24HR, Oral) Active. Flecainide Acetate (50MG  Tablet, Oral) Active. Hydrochlorothiazide (25MG  Tablet, Oral) Active. Lisinopril (20MG  Tablet, Oral) Active. Metoprolol Tartrate (50MG  Tablet, Oral) Active. Ondansetron HCl (4MG  Tablet, Oral) Active. Pantoprazole Sodium (40MG  Tablet DR, Oral) Active. Albuterol Sulfate HFA (108MCG/ACT Aerosol Soln, Inhalation) Active. Medications Reconciled    Review of Systems [name] Ok, MD; 03/09/2015 10:32 AM) General Not Present- Appetite Loss, Chills, Fatigue, Fever, Night Sweats, Weight Gain and Weight Loss. Skin Not Present- Change in Wart/Mole, Dryness, Hives, Jaundice, New Lesions, Non-Healing Wounds, Rash and Ulcer. HEENT Present- Seasonal Allergies and Wears glasses/contact lenses. Not Present- Earache, Hearing Loss,  Hoarseness, Nose Bleed, Oral Ulcers, Ringing in the Ears, Sinus Pain, Sore Throat, Visual Disturbances and Yellow Eyes. Respiratory Present- Chronic Cough. Not Present- Bloody sputum, Difficulty Breathing, Snoring and Wheezing. Breast Not Present- Breast Mass, Breast Pain, Nipple Discharge and Skin Changes. Cardiovascular Not Present- Chest Pain, Difficulty Breathing Lying Down, Leg Cramps, Palpitations, Rapid Heart Rate, Shortness of Breath and Swelling of Extremities. Gastrointestinal Not Present- Abdominal Pain, Bloating, Bloody Stool, Change in Bowel Habits, Chronic diarrhea, Constipation, Difficulty Swallowing, Excessive gas, Gets full quickly at meals, Hemorrhoids, Indigestion, Nausea, Rectal Pain and Vomiting. Musculoskeletal Not Present- Back Pain, Joint Pain, Joint Stiffness, Muscle Pain, Muscle Weakness and Swelling of Extremities. Neurological Not Present- Decreased Memory, Fainting, Headaches, Numbness, Seizures, Tingling, Tremor, Trouble walking and Weakness. Psychiatric Present- Anxiety and Depression. Not Present- Bipolar, Change in Sleep Pattern, Fearful and Frequent crying. Endocrine Not Present- Cold Intolerance, Excessive Hunger, Hair Changes, Heat Intolerance and New Diabetes. Hematology Not Present- Easy Bruising, Excessive bleeding, Gland problems, HIV and Persistent Infections.  Vitals Elbert Ewings CMA; 03/09/2015 10:19 AM) 03/09/2015 10:19 AM Weight: 160.6 lb Height: 61in Body Surface Area: 1.72 m Body Mass Index: 30.34 kg/m  Temp.: 98.24F(Temporal)  Pulse: 61 (Regular)  BP: 132/60 (Sitting, Left Arm, Standard)       Physical Exam [name] Ok MD; 03/09/2015 10:32 AM) General Mental Status-Alert. General Appearance-Consistent with stated age. Hydration-Well hydrated. Voice-Normal.  Head and Neck Head-normocephalic, atraumatic with no lesions or palpable masses.  Eye Eyeball - Bilateral-Extraocular movements  intact. Sclera/Conjunctiva - Bilateral-No scleral icterus.  Chest and Lung Exam Inspection Chest Wall - Normal. Back - normal.  Cardiovascular Cardiovascular examination reveals -on palpation PMI is normal in location and amplitude, no palpable S3 or S4. Normal cardiac borders., normal heart sounds, regular rate and rhythm with no murmurs, carotid auscultation reveals no bruits and normal pedal pulses bilaterally.  Abdomen Inspection  Palpation/Percussion Tenderness - Right Upper Quadrant.  Neurologic Neurologic evaluation reveals -alert and oriented x 3 with no impairment of recent or remote memory. Mental Status-Normal.  Musculoskeletal Normal Exam - Left-Upper Extremity Strength Normal and Lower Extremity Strength Normal. Normal Exam - Right-Upper Extremity Strength Normal, Lower Extremity Weakness.    Assessment & Plan [name] Ok MD; 03/09/2015 10:33 AM) GALLSTONES (K80.20) Impression:  64 year old female with symptomatic cholelithiasis  1.  Will proceed to the operative for laparoscopic cholecystectomy 2.All risks and benefits were discussed with the patient to generally include: infection, bleeding, possible need for post op ERCP, damage to the bile ducts, and bile leak. Alternatives were offered and described.  All questions were answered and the patient voiced understanding of the procedure and wishes to proceed at this point with a laparoscopic cholecystectomy

## 2015-03-22 ENCOUNTER — Encounter (HOSPITAL_COMMUNITY): Payer: Self-pay | Admitting: *Deleted

## 2015-03-22 MED ORDER — CEFAZOLIN SODIUM-DEXTROSE 2-3 GM-% IV SOLR
2.0000 g | INTRAVENOUS | Status: AC
  Administered 2015-03-23: 2 g via INTRAVENOUS

## 2015-03-22 MED ORDER — CHLORHEXIDINE GLUCONATE 4 % EX LIQD: 1.0000 "application " | Freq: Once | CUTANEOUS | Status: DC

## 2015-03-22 NOTE — Progress Notes (Signed)
Anesthesia Chart Review:  Pt 64 year old female scheduled for laparoscopic cholecystectomy on 03/23/2015 with Dr. Rosendo Gros.   Pt is a same day work up.   Cardiologist is Dr. Angelena Form. EP cardiologist is Dr. Rayann Heman. PCP is Dr. Alysia Penna.   PMH includes:  HTN, PVCs, LBBB, hyperlipidemia, COPD, GERD. Current smoker. BMI 30.5.   Anesthesia history: Medication for block "went up to my brain and I stopped breathing"  Medications include: albuterol, ASA, lipitor, symbicort, flecainide, hctz, lisinopril, protonix.   Labs will be obtained DOS.   Chest x-ray 09/09/14 reviewed. No acute cardiopulmonary disease.   EKG 03/03/15: NSR.   Cardiac cath 04/22/13: Left main: No obstructive disease Left Anterior Descending Artery: Large caliber vessel that courses to the apex. The proximal vessel has diffuse 20% stenosis. The mid vessel has a 50-60% stenosis. There is a small septal branch that arises from this plaque with ostial 90% stenosis. Several small caliber diagonal branches.  Circumflex Artery: Large caliber vessel with large caliber first obtuse marginal branch. No obstructive disease.  Right Coronary Artery: Large dominant vessel with proximal and mid calcification, diffuse 20% stenosis in the proximal and mid vessel.  Left Ventricular Angiogram: LVEF=55-60%.  Impression: 1. Moderate non-obstructive CAD 2. Moderate mid LAD stenosis with FFR borderline at 0.82.  3. Normal LV systolic function 4. Medical management recommended  Dr. Camillia Herter note 01/18/15 indicates he is aware pt will need cholecystecomy.   If labs acceptable DOS, I anticipate pt can proceed as scheduled.   Willeen Cass, FNP-BC [facility] Short Stay Surgical Center/Anesthesiology Phone: [phone] 03/22/2015 1:40 PM   Echo 09/03/13:  - Left ventricle: The cavity size was normal. Systolic function was normal. The estimated ejection fraction was in the range of 60% to 65%. Wall motion was normal; there were no regional  wall motion abnormalities. - Aortic valve: Possibly bicuspid; normal thickness leaflets. - Aorta: Dilated aortic root and ascending aorta measuring 40 mm. There is possible dissection flap in the ascending aorta 48 mm from the aortic annulus. - Mitral valve: Structurally normal valve. There was no regurgitation. - Left atrium: The atrium was normal in size. - Right ventricle: The cavity size was normal. Wall thickness was normal. - Right atrium: The atrium was normal in size. - Tricuspid valve: Structurally normal valve. There was no regurgitation. - Pulmonic valve: Structurally normal valve. - Inferior vena cava: The vessel was normal in size. - Pericardium, extracardiac: There was no pericardial effusion. - Impressions: Dilated aortic root and ascending aorta measuring 40 mm. There ispossible dissection flap in the ascending aorta that needs to be further evaluated by either TEE or CTA.  CT angio chest 09/29/13:  1. Prominent ascending aorta measuring 3.7 cm. 2. Thoracic aorta atherosclerosis without dissection. 3. Age advanced coronary artery atherosclerosis. Recommend assessment of coronary risk factors and consideration of medical therapy. 4. Faint airspace disease especially involving the upper lobes, consistent with alveolitis. Correlation is recommended with pulmonary symptoms. 5. Small right thyroid nodule. No further evaluation is felt to be necessary.  6. Possible adrenal hyperplasia.

## 2015-03-22 NOTE — Progress Notes (Signed)
[name] [name] has a history of irregular heart beat, "it feels like it is racing, but when I check the pulse rate on a pulse ox it is slow.  Patient has documented frequent PVC's.  Patient states that when she fast heart rate that she has chest pressure that last less than a minute- "usually happens when I am lying down."  Patient denies noticing any increase in shortness of breath, "I have COPD and I am short of breath a lot." Denies N/V or light headedness.  Patient also has episodes that happen while she is walking, "I get really sweaty, lightheaded, nauseous and I fell like if I don't seat down that I will fall down."  Patient reports that she has had this for over a year and that her medical doctor and cardiologist can not figure out what it is.

## 2015-03-23 ENCOUNTER — Encounter (HOSPITAL_COMMUNITY)
Admission: RE | Disposition: A | Discharge status: Home / Self Care | Payer: Self-pay | Source: Ambulatory Visit | Attending: General Surgery

## 2015-03-23 ENCOUNTER — Ambulatory Visit (HOSPITAL_COMMUNITY)
Admission: RE | Admit: 2015-03-23 | Discharge: 2015-03-23 | Disposition: A | Discharge status: Home / Self Care | Payer: Medicare Other | Source: Ambulatory Visit | Attending: General Surgery | Admitting: General Surgery

## 2015-03-23 ENCOUNTER — Ambulatory Visit (HOSPITAL_COMMUNITY): Payer: Medicare Other | Admitting: Emergency Medicine

## 2015-03-23 DIAGNOSIS — Z7982 Long term (current) use of aspirin: Secondary | ICD-10-CM | POA: Insufficient documentation

## 2015-03-23 DIAGNOSIS — E785 Hyperlipidemia, unspecified: Secondary | ICD-10-CM | POA: Diagnosis not present

## 2015-03-23 DIAGNOSIS — I2510 Atherosclerotic heart disease of native coronary artery without angina pectoris: Secondary | ICD-10-CM | POA: Insufficient documentation

## 2015-03-23 DIAGNOSIS — K219 Gastro-esophageal reflux disease without esophagitis: Secondary | ICD-10-CM | POA: Insufficient documentation

## 2015-03-23 DIAGNOSIS — J449 Chronic obstructive pulmonary disease, unspecified: Secondary | ICD-10-CM | POA: Diagnosis not present

## 2015-03-23 DIAGNOSIS — Z6830 Body mass index (BMI) 30.0-30.9, adult: Secondary | ICD-10-CM | POA: Insufficient documentation

## 2015-03-23 DIAGNOSIS — M1990 Unspecified osteoarthritis, unspecified site: Secondary | ICD-10-CM | POA: Insufficient documentation

## 2015-03-23 DIAGNOSIS — Z7951 Long term (current) use of inhaled steroids: Secondary | ICD-10-CM | POA: Insufficient documentation

## 2015-03-23 DIAGNOSIS — Z79899 Other long term (current) drug therapy: Secondary | ICD-10-CM | POA: Diagnosis not present

## 2015-03-23 DIAGNOSIS — K8020 Calculus of gallbladder without cholecystitis without obstruction: Secondary | ICD-10-CM | POA: Insufficient documentation

## 2015-03-23 DIAGNOSIS — F17200 Nicotine dependence, unspecified, uncomplicated: Secondary | ICD-10-CM | POA: Insufficient documentation

## 2015-03-23 DIAGNOSIS — K8010 Calculus of gallbladder with chronic cholecystitis without obstruction: Secondary | ICD-10-CM | POA: Diagnosis not present

## 2015-03-23 DIAGNOSIS — I10 Essential (primary) hypertension: Secondary | ICD-10-CM | POA: Insufficient documentation

## 2015-03-23 HISTORY — DX: Adverse effect of unspecified anesthetic, initial encounter: T41.45XA

## 2015-03-23 HISTORY — PX: CHOLECYSTECTOMY: SHX55

## 2015-03-23 LAB — BASIC METABOLIC PANEL
Anion gap: 9 (ref 5–15)
BUN: 17 mg/dL (ref 6–20)
CALCIUM: 8.8 mg/dL — AB (ref 8.9–10.3)
CHLORIDE: 104 mmol/L (ref 101–111)
CO2: 26 mmol/L (ref 22–32)
CREATININE: 1.01 mg/dL — AB (ref 0.44–1.00)
GFR calc Af Amer: >60 mL/min (ref >60)
GFR calc non Af Amer: 57 mL/min — ABNORMAL LOW (ref >60)
GLUCOSE: 104 mg/dL — AB (ref 65–99)
Potassium: 3.8 mmol/L (ref 3.5–5.1)
Sodium: 139 mmol/L (ref 135–145)

## 2015-03-23 LAB — CBC
HCT: 42.4 % (ref 36.0–46.0)
Hemoglobin: 14.2 g/dL (ref 12.0–15.0)
MCH: 31.3 pg (ref 26.0–34.0)
MCHC: 33.5 g/dL (ref 30.0–36.0)
MCV: 93.6 fL (ref 78.0–100.0)
PLATELETS: 249 10*3/uL (ref 150–400)
RBC: 4.53 MIL/uL (ref 3.87–5.11)
RDW: 13.8 % (ref 11.5–15.5)
WBC: 7.4 10*3/uL (ref 4.0–10.5)

## 2015-03-23 SURGERY — LAPAROSCOPIC CHOLECYSTECTOMY
Anesthesia: General | Site: Abdomen

## 2015-03-23 MED ORDER — MIDAZOLAM HCL 2 MG/2ML IJ SOLN
INTRAMUSCULAR | Status: AC
  Filled 2015-03-23: qty 2

## 2015-03-23 MED ORDER — FENTANYL CITRATE (PF) 250 MCG/5ML IJ SOLN
INTRAMUSCULAR | Status: AC
  Filled 2015-03-23: qty 5

## 2015-03-23 MED ORDER — LIDOCAINE HCL (CARDIAC) 20 MG/ML IV SOLN
INTRAVENOUS | Status: DC | PRN
  Administered 2015-03-23: 50 mg via INTRAVENOUS
  Administered 2015-03-23: 50 mg via INTRATRACHEAL

## 2015-03-23 MED ORDER — LACTATED RINGERS IV SOLN
INTRAVENOUS | Status: DC | PRN
  Administered 2015-03-23: 11:00:00 via INTRAVENOUS

## 2015-03-23 MED ORDER — GLYCOPYRROLATE 0.2 MG/ML IJ SOLN
INTRAMUSCULAR | Status: DC | PRN
  Administered 2015-03-23: 0.2 mg via INTRAVENOUS

## 2015-03-23 MED ORDER — FENTANYL CITRATE (PF) 100 MCG/2ML IJ SOLN
INTRAMUSCULAR | Status: DC | PRN
  Administered 2015-03-23: 50 ug via INTRAVENOUS
  Administered 2015-03-23: 100 ug via INTRAVENOUS
  Administered 2015-03-23: 50 ug via INTRAVENOUS

## 2015-03-23 MED ORDER — BUPIVACAINE HCL 0.25 % IJ SOLN
INTRAMUSCULAR | Status: DC | PRN
  Administered 2015-03-23: 3 mL

## 2015-03-23 MED ORDER — EPHEDRINE SULFATE 50 MG/ML IJ SOLN
INTRAMUSCULAR | Status: DC | PRN
  Administered 2015-03-23: 5 mg via INTRAVENOUS
  Administered 2015-03-23: 10 mg via INTRAVENOUS
  Administered 2015-03-23: 5 mg via INTRAVENOUS

## 2015-03-23 MED ORDER — PROPOFOL 10 MG/ML IV BOLUS
INTRAVENOUS | Status: AC
  Filled 2015-03-23: qty 20

## 2015-03-23 MED ORDER — SODIUM CHLORIDE 0.9 % IJ SOLN: 3.0000 mL | INTRAMUSCULAR | Status: DC | PRN

## 2015-03-23 MED ORDER — MEPERIDINE HCL 25 MG/ML IJ SOLN: 6.2500 mg | INTRAMUSCULAR | Status: DC | PRN

## 2015-03-23 MED ORDER — MIDAZOLAM HCL 2 MG/2ML IJ SOLN: 0.5000 mg | Freq: Once | INTRAMUSCULAR | Status: DC | PRN

## 2015-03-23 MED ORDER — SUGAMMADEX SODIUM 200 MG/2ML IV SOLN
INTRAVENOUS | Status: AC
  Filled 2015-03-23: qty 2

## 2015-03-23 MED ORDER — MIDAZOLAM HCL 5 MG/5ML IJ SOLN
INTRAMUSCULAR | Status: DC | PRN
  Administered 2015-03-23 (×2): 1 mg via INTRAVENOUS

## 2015-03-23 MED ORDER — SUGAMMADEX SODIUM 200 MG/2ML IV SOLN
INTRAVENOUS | Status: DC | PRN
  Administered 2015-03-23: 50 mg via INTRAVENOUS
  Administered 2015-03-23: 150 mg via INTRAVENOUS
  Administered 2015-03-23: 50 mg via INTRAVENOUS

## 2015-03-23 MED ORDER — LACTATED RINGERS IV SOLN
INTRAVENOUS | Status: DC
  Administered 2015-03-23: 10:00:00 via INTRAVENOUS

## 2015-03-23 MED ORDER — HYDROCODONE-ACETAMINOPHEN 5-325 MG PO TABS
2.0000 | ORAL_TABLET | Freq: Once | ORAL | Status: AC
  Administered 2015-03-23: 2 via ORAL

## 2015-03-23 MED ORDER — ONDANSETRON HCL 4 MG/2ML IJ SOLN
INTRAMUSCULAR | Status: DC | PRN
  Administered 2015-03-23: 4 mg via INTRAVENOUS

## 2015-03-23 MED ORDER — PROPOFOL 10 MG/ML IV BOLUS
INTRAVENOUS | Status: DC | PRN
  Administered 2015-03-23: 80 mg via INTRAVENOUS

## 2015-03-23 MED ORDER — ONDANSETRON HCL 4 MG/2ML IJ SOLN
INTRAMUSCULAR | Status: AC
  Filled 2015-03-23: qty 2

## 2015-03-23 MED ORDER — ACETAMINOPHEN 650 MG RE SUPP: 650.0000 mg | RECTAL | Status: DC | PRN

## 2015-03-23 MED ORDER — ROCURONIUM BROMIDE 50 MG/5ML IV SOLN
INTRAVENOUS | Status: AC
  Filled 2015-03-23: qty 1

## 2015-03-23 MED ORDER — HYDROMORPHONE HCL 1 MG/ML IJ SOLN
INTRAMUSCULAR | Status: AC
  Administered 2015-03-23: 0.5 mg via INTRAVENOUS
  Filled 2015-03-23: qty 1

## 2015-03-23 MED ORDER — ONDANSETRON HCL 4 MG PO TABS
ORAL_TABLET | ORAL | Status: AC
  Administered 2015-03-23: 4 mg via ORAL
  Filled 2015-03-23: qty 1

## 2015-03-23 MED ORDER — PROMETHAZINE HCL 25 MG/ML IJ SOLN
INTRAMUSCULAR | Status: AC
  Filled 2015-03-23: qty 1

## 2015-03-23 MED ORDER — SODIUM CHLORIDE 0.9 % IV SOLN: 250.0000 mL | INTRAVENOUS | Status: DC | PRN

## 2015-03-23 MED ORDER — ONDANSETRON HCL 4 MG PO TABS
4.0000 mg | ORAL_TABLET | Freq: Once | ORAL | Status: AC
  Administered 2015-03-23: 4 mg via ORAL

## 2015-03-23 MED ORDER — PROMETHAZINE HCL 25 MG/ML IJ SOLN
6.2500 mg | INTRAMUSCULAR | Status: DC | PRN
  Administered 2015-03-23: 6.25 mg via INTRAVENOUS

## 2015-03-23 MED ORDER — BUPIVACAINE-EPINEPHRINE (PF) 0.25% -1:200000 IJ SOLN
INTRAMUSCULAR | Status: AC
  Filled 2015-03-23: qty 30

## 2015-03-23 MED ORDER — ARTIFICIAL TEARS OP OINT
TOPICAL_OINTMENT | OPHTHALMIC | Status: AC
  Filled 2015-03-23: qty 3.5

## 2015-03-23 MED ORDER — ROCURONIUM BROMIDE 100 MG/10ML IV SOLN
INTRAVENOUS | Status: DC | PRN
  Administered 2015-03-23: 40 mg via INTRAVENOUS

## 2015-03-23 MED ORDER — HYDROCODONE-ACETAMINOPHEN 5-325 MG PO TABS: 1.0000 | ORAL_TABLET | Freq: Four times a day (QID) | ORAL | Status: DC | PRN

## 2015-03-23 MED ORDER — HYDROCODONE-ACETAMINOPHEN 5-325 MG PO TABS
ORAL_TABLET | ORAL | Status: AC
  Administered 2015-03-23: 2 via ORAL
  Filled 2015-03-23: qty 2

## 2015-03-23 MED ORDER — SODIUM CHLORIDE 0.9 % IJ SOLN: 3.0000 mL | Freq: Two times a day (BID) | INTRAMUSCULAR | Status: DC

## 2015-03-23 MED ORDER — HYDROMORPHONE HCL 1 MG/ML IJ SOLN
0.2500 mg | INTRAMUSCULAR | Status: DC | PRN
  Administered 2015-03-23 (×4): 0.5 mg via INTRAVENOUS

## 2015-03-23 MED ORDER — HYDROMORPHONE HCL 1 MG/ML IJ SOLN
INTRAMUSCULAR | Status: AC
  Filled 2015-03-23: qty 1

## 2015-03-23 MED ORDER — MORPHINE SULFATE (PF) 2 MG/ML IV SOLN: 2.0000 mg | INTRAVENOUS | Status: DC | PRN

## 2015-03-23 MED ORDER — ACETAMINOPHEN 325 MG PO TABS: 650.0000 mg | ORAL_TABLET | ORAL | Status: DC | PRN

## 2015-03-23 MED ORDER — SODIUM CHLORIDE 0.9 % IR SOLN
Status: DC | PRN
  Administered 2015-03-23: 1000 mL

## 2015-03-23 SURGICAL SUPPLY — 37 items
BENZOIN TINCTURE PRP APPL 2/3 (GAUZE/BANDAGES/DRESSINGS) ×3 IMPLANT
CANISTER SUCTION 2500CC (MISCELLANEOUS) ×3 IMPLANT
CHLORAPREP W/TINT 26ML (MISCELLANEOUS) ×3 IMPLANT
CLIP LIGATING HEMO O LOK GREEN (MISCELLANEOUS) ×6 IMPLANT
CLOSURE STERI-STRIP 1/2X4 (GAUZE/BANDAGES/DRESSINGS) ×1
CLSR STERI-STRIP ANTIMIC 1/2X4 (GAUZE/BANDAGES/DRESSINGS) ×2 IMPLANT
COVER SURGICAL LIGHT HANDLE (MISCELLANEOUS) ×3 IMPLANT
COVER TRANSDUCER ULTRASND (DRAPES) ×3 IMPLANT
DEVICE TROCAR PUNCTURE CLOSURE (ENDOMECHANICALS) ×3 IMPLANT
DRSG TEGADERM 2-3/8X2-3/4 SM (GAUZE/BANDAGES/DRESSINGS) ×9 IMPLANT
ELECT REM PT RETURN 9FT ADLT (ELECTROSURGICAL) ×3
ELECTRODE REM PT RTRN 9FT ADLT (ELECTROSURGICAL) ×1 IMPLANT
GAUZE SPONGE 2X2 8PLY STRL LF (GAUZE/BANDAGES/DRESSINGS) ×1 IMPLANT
GLOVE BIO SURGEON STRL SZ7.5 (GLOVE) ×3 IMPLANT
GOWN STRL REUS W/ TWL LRG LVL3 (GOWN DISPOSABLE) ×2 IMPLANT
GOWN STRL REUS W/ TWL XL LVL3 (GOWN DISPOSABLE) ×1 IMPLANT
GOWN STRL REUS W/TWL LRG LVL3 (GOWN DISPOSABLE) ×4
GOWN STRL REUS W/TWL XL LVL3 (GOWN DISPOSABLE) ×2
KIT BASIN OR (CUSTOM PROCEDURE TRAY) ×3 IMPLANT
KIT ROOM TURNOVER OR (KITS) ×3 IMPLANT
NEEDLE INSUFFLATION 14GA 120MM (NEEDLE) ×3 IMPLANT
NS IRRIG 1000ML POUR BTL (IV SOLUTION) ×3 IMPLANT
PAD ARMBOARD 7.5X6 YLW CONV (MISCELLANEOUS) ×6 IMPLANT
POUCH RETRIEVAL ECOSAC 10 (ENDOMECHANICALS) IMPLANT
POUCH RETRIEVAL ECOSAC 10MM (ENDOMECHANICALS)
SCISSORS LAP 5X35 DISP (ENDOMECHANICALS) ×3 IMPLANT
SET IRRIG TUBING LAPAROSCOPIC (IRRIGATION / IRRIGATOR) ×3 IMPLANT
SLEEVE ENDOPATH XCEL 5M (ENDOMECHANICALS) ×3 IMPLANT
SPECIMEN JAR SMALL (MISCELLANEOUS) ×3 IMPLANT
SPONGE GAUZE 2X2 STER 10/PKG (GAUZE/BANDAGES/DRESSINGS) ×2
SUT MNCRL AB 3-0 PS2 18 (SUTURE) ×3 IMPLANT
TOWEL OR 17X24 6PK STRL BLUE (TOWEL DISPOSABLE) ×3 IMPLANT
TOWEL OR 17X26 10 PK STRL BLUE (TOWEL DISPOSABLE) ×3 IMPLANT
TRAY LAPAROSCOPIC MC (CUSTOM PROCEDURE TRAY) ×3 IMPLANT
TROCAR XCEL NON-BLD 11X100MML (ENDOMECHANICALS) ×3 IMPLANT
TROCAR XCEL NON-BLD 5MMX100MML (ENDOMECHANICALS) ×3 IMPLANT
TUBING INSUFFLATION (TUBING) ×3 IMPLANT

## 2015-03-23 NOTE — Progress Notes (Signed)
Pt brought to phase 2 unable to void and still c/o nausea. Charge RN notified-decided pt would best be placed in short stay. Verdis Frederickson, RN calling report to short stay

## 2015-03-23 NOTE — Progress Notes (Signed)
Pt states her nausea is resolved and her pain is tolerable. Discharge instructions given to pt who verbalizes understanding and denies further questions.

## 2015-03-23 NOTE — Op Note (Signed)
03/23/2015  12:09 PM  PATIENT:  [name] [name]  64 y.o. female  PRE-OPERATIVE DIAGNOSIS:  GALLSTONES  POST-OPERATIVE DIAGNOSIS:  GALLSTONES  PROCEDURE:  Procedure(s): LAPAROSCOPIC CHOLECYSTECTOMY (N/A)  SURGEON:  Surgeon(s) and Role:    * Ralene Ok, MD - Primary  ANESTHESIA:   local and general  EBL:   <5cc  BLOOD ADMINISTERED:none  DRAINS: none   LOCAL MEDICATIONS USED:  BUPIVICAINE   SPECIMEN:  Source of Specimen:  gallbladder  DISPOSITION OF SPECIMEN:  PATHOLOGY  COUNTS:  YES  TOURNIQUET:  * No tourniquets in log *  DICTATION: .Dragon Dictation  EBL: [id number]   Complications: none   Counts: reported as correct x 2   Findings:chronically inflammed gallbladder  Details of the procedure: The patient was taken to the operating and placed in the supine position with bilateral SCDs in place. A time out was called and all facts were verified. A pneumoperitoneum was obtained via A Veress needle technique to a pressure of 46mm of mercury. A 84mm trochar was then placed in the right upper quadrant under visualization, and there were no injuries to any abdominal organs. A 11 mm port was then placed in the umbilical region after infiltrating with local anesthesia under direct visualization. A second epigastric port was placed under direct visualization.   The gallbladder was identified and retracted, the peritoneum was then sharply dissected from the gallbladder and this dissection was carried down to Calot's triangle. The cystic duct was identified and stripped away circumferentially and seen going into the gallbladder 360, the critical angle was obtained.   3 clips were placed proximally one distally and the cystic duct transected. The cystic artery was identified and 2 clips placed proximally and one distally and transected. We then proceeded to remove the gallbladder off the hepatic fossa with Bovie cautery. A retrieval bag was then placed in the abdomen and gallbladder  placed in the bag. The hepatic fossa was then reexamined and hemostasis was achieved with Bovie cautery and was excellent at this portion of the case. The subhepatic fossa and perihepatic fossa was then irrigated until the effluent was clear. The specimen bag and specimen were removed from the abdominal cavity.  The 11 mm trocar fascia was reapproximated with the Endo Close #1 Vicryl x1. The pneumoperitoneum was evacuated and all trochars removed under direct visulalization. The skin was then closed with 4-0 Monocryl and the skin dressed with Steri-Strips, gauze, and tape. The patient was awaken from general anesthesia and taken to the recovery room in stable condition.   PLAN OF CARE: Discharge to home after PACU  PATIENT DISPOSITION:  PACU - hemodynamically stable.   Delay start of Pharmacological VTE agent (>24hrs) due to surgical blood loss or risk of bleeding: not applicable

## 2015-03-23 NOTE — H&P (View-Only) (Signed)
History of Present Illness [name] Ok MD; 03/09/2015 10:32 AM) The patient is a 64 year old female who presents for evaluation of gall stones. The patient is a 64 year old female with history of gallstones. Patient was seen last in August of this year. She states that since that time she is continue with constant right upper quadrant pain. She states it does not change in regards to the change in her diet.  Patient previously has had a cyst and duodenum which is felt to be benign.  The patient sees Drs. McElrhaney and Allred. Patient has a history of lower back pain is currently on Norco 10/325.   Allergies Elbert Ewings, CMA; 03/09/2015 10:19 AM) Dexamethasone (Ophth) *OPHTHALMIC AGENTS* OxyCODONE HCl *ANALGESICS - OPIOID*  Medication History Elbert Ewings, CMA; 03/09/2015 10:19 AM) Hydrocodone-Acetaminophen (5-325MG  Tablet, Oral) Active. ALPRAZolam (0.25MG  Tablet, Oral) Active. Symbicort (160-4.5MCG/ACT Aerosol, Inhalation) Active. PredniSONE (10MG  Tablet, Oral) Active. TraMADol HCl (50MG  Tablet, Oral) Active. Atorvastatin Calcium (20MG  Tablet, Oral) Active. BuPROPion HCl ER (XL) (150MG  Tablet ER 24HR, Oral) Active. Flecainide Acetate (50MG  Tablet, Oral) Active. Hydrochlorothiazide (25MG  Tablet, Oral) Active. Lisinopril (20MG  Tablet, Oral) Active. Metoprolol Tartrate (50MG  Tablet, Oral) Active. Ondansetron HCl (4MG  Tablet, Oral) Active. Pantoprazole Sodium (40MG  Tablet DR, Oral) Active. Albuterol Sulfate HFA (108MCG/ACT Aerosol Soln, Inhalation) Active. Medications Reconciled    Review of Systems [name] Ok, MD; 03/09/2015 10:32 AM) General Not Present- Appetite Loss, Chills, Fatigue, Fever, Night Sweats, Weight Gain and Weight Loss. Skin Not Present- Change in Wart/Mole, Dryness, Hives, Jaundice, New Lesions, Non-Healing Wounds, Rash and Ulcer. HEENT Present- Seasonal Allergies and Wears glasses/contact lenses. Not Present- Earache, Hearing Loss,  Hoarseness, Nose Bleed, Oral Ulcers, Ringing in the Ears, Sinus Pain, Sore Throat, Visual Disturbances and Yellow Eyes. Respiratory Present- Chronic Cough. Not Present- Bloody sputum, Difficulty Breathing, Snoring and Wheezing. Breast Not Present- Breast Mass, Breast Pain, Nipple Discharge and Skin Changes. Cardiovascular Not Present- Chest Pain, Difficulty Breathing Lying Down, Leg Cramps, Palpitations, Rapid Heart Rate, Shortness of Breath and Swelling of Extremities. Gastrointestinal Not Present- Abdominal Pain, Bloating, Bloody Stool, Change in Bowel Habits, Chronic diarrhea, Constipation, Difficulty Swallowing, Excessive gas, Gets full quickly at meals, Hemorrhoids, Indigestion, Nausea, Rectal Pain and Vomiting. Musculoskeletal Not Present- Back Pain, Joint Pain, Joint Stiffness, Muscle Pain, Muscle Weakness and Swelling of Extremities. Neurological Not Present- Decreased Memory, Fainting, Headaches, Numbness, Seizures, Tingling, Tremor, Trouble walking and Weakness. Psychiatric Present- Anxiety and Depression. Not Present- Bipolar, Change in Sleep Pattern, Fearful and Frequent crying. Endocrine Not Present- Cold Intolerance, Excessive Hunger, Hair Changes, Heat Intolerance and New Diabetes. Hematology Not Present- Easy Bruising, Excessive bleeding, Gland problems, HIV and Persistent Infections.  Vitals Elbert Ewings CMA; 03/09/2015 10:19 AM) 03/09/2015 10:19 AM Weight: 160.6 lb Height: 61in Body Surface Area: 1.72 m Body Mass Index: 30.34 kg/m  Temp.: 98.45F(Temporal)  Pulse: 61 (Regular)  BP: 132/60 (Sitting, Left Arm, Standard)       Physical Exam [name] Ok MD; 03/09/2015 10:32 AM) General Mental Status-Alert. General Appearance-Consistent with stated age. Hydration-Well hydrated. Voice-Normal.  Head and Neck Head-normocephalic, atraumatic with no lesions or palpable masses.  Eye Eyeball - Bilateral-Extraocular movements  intact. Sclera/Conjunctiva - Bilateral-No scleral icterus.  Chest and Lung Exam Inspection Chest Wall - Normal. Back - normal.  Cardiovascular Cardiovascular examination reveals -on palpation PMI is normal in location and amplitude, no palpable S3 or S4. Normal cardiac borders., normal heart sounds, regular rate and rhythm with no murmurs, carotid auscultation reveals no bruits and normal pedal pulses bilaterally.  Abdomen Inspection  Palpation/Percussion Tenderness - Right Upper Quadrant.  Neurologic Neurologic evaluation reveals -alert and oriented x 3 with no impairment of recent or remote memory. Mental Status-Normal.  Musculoskeletal Normal Exam - Left-Upper Extremity Strength Normal and Lower Extremity Strength Normal. Normal Exam - Right-Upper Extremity Strength Normal, Lower Extremity Weakness.    Assessment & Plan [name] Ok MD; 03/09/2015 10:33 AM) GALLSTONES (K80.20) Impression:  64 year old female with symptomatic cholelithiasis  1.  Will proceed to the operative for laparoscopic cholecystectomy 2.All risks and benefits were discussed with the patient to generally include: infection, bleeding, possible need for post op ERCP, damage to the bile ducts, and bile leak. Alternatives were offered and described.  All questions were answered and the patient voiced understanding of the procedure and wishes to proceed at this point with a laparoscopic cholecystectomy

## 2015-03-23 NOTE — Anesthesia Preprocedure Evaluation (Addendum)
Anesthesia Evaluation  Patient identified by MRN, date of birth, ID band Patient awake    Reviewed: Allergy & Precautions, NPO status , Patient's Chart, lab work & pertinent test results  History of Anesthesia Complications Negative for: history of anesthetic complications  Airway Mallampati: II  TM Distance: >3 FB Neck ROM: Full    Dental  (+) Partial Lower, Partial Upper, Missing, Chipped, Dental Advisory Given   Pulmonary COPD,  COPD inhaler, Current Smoker,    breath sounds clear to auscultation       Cardiovascular hypertension, Pt. on medications and Pt. on home beta blockers (-) angina+ dysrhythmias  Rhythm:Regular Rate:Normal  '15 ECHO: EF 60-65%, valves OK   Neuro/Psych Anxiety Depression negative neurological ROS     GI/Hepatic Neg liver ROS, GERD  Medicated and Controlled,  Endo/Other  Morbid obesity  Renal/GU negative Renal ROS     Musculoskeletal  (+) Arthritis , Osteoarthritis,    Abdominal (+) + obese,   Peds  Hematology negative hematology ROS (+)   Anesthesia Other Findings   Reproductive/Obstetrics                          Anesthesia Physical Anesthesia Plan  ASA: III  Anesthesia Plan: General   Post-op Pain Management:    Induction: Intravenous  Airway Management Planned: Oral ETT  Additional Equipment:   Intra-op Plan:   Post-operative Plan: Extubation in OR  Informed Consent: I have reviewed the patients History and Physical, chart, labs and discussed the procedure including the risks, benefits and alternatives for the proposed anesthesia with the patient or authorized representative who has indicated his/her understanding and acceptance.   Dental advisory given  Plan Discussed with: CRNA and Surgeon  Anesthesia Plan Comments: (Plan routine monitors, GETA)        Anesthesia Quick Evaluation

## 2015-03-23 NOTE — Interval H&P Note (Signed)
History and Physical Interval Note:  03/23/2015 7:18 AM  [name] [name]  has presented today for surgery, with the diagnosis of GALLSTONES  The various methods of treatment have been discussed with the patient and family. After consideration of risks, benefits and other options for treatment, the patient has consented to  Procedure(s): LAPAROSCOPIC CHOLECYSTECTOMY (N/A) as a surgical intervention .  The patient's history has been reviewed, patient examined, no change in status, stable for surgery.  I have reviewed the patient's chart and labs.  Questions were answered to the patient's satisfaction.     Rosario Jacks., Anne Hahn

## 2015-03-23 NOTE — Anesthesia Postprocedure Evaluation (Signed)
Anesthesia Post Note  Patient: [name] [name]  Procedure(s) Performed: Procedure(s) (LRB): LAPAROSCOPIC CHOLECYSTECTOMY (N/A)  Patient location during evaluation: PACU Anesthesia Type: General Level of consciousness: oriented, sedated and patient cooperative Pain management: pain level controlled Vital Signs Assessment: post-procedure vital signs reviewed and stable Respiratory status: spontaneous breathing, nonlabored ventilation, respiratory function stable and patient connected to nasal cannula oxygen Cardiovascular status: blood pressure returned to baseline and stable Postop Assessment: no signs of nausea or vomiting Anesthetic complications: no    Last Vitals:  Filed Vitals:   03/23/15 1245 03/23/15 1300  BP: 108/62 116/78  Pulse: 61 59  Temp:    Resp: 14 16    Last Pain:  Filed Vitals:   03/23/15 1310  PainSc: 0-No pain                 Riad Wagley,E. Leeloo Silverthorne

## 2015-03-23 NOTE — Transfer of Care (Signed)
Immediate Anesthesia Transfer of Care Note  Patient: [name] [name]  Procedure(s) Performed: Procedure(s): LAPAROSCOPIC CHOLECYSTECTOMY (N/A)  Patient Location: PACU  Anesthesia Type:General  Level of Consciousness: awake, alert  and oriented  Airway & Oxygen Therapy: Patient Spontanous Breathing  Post-op Assessment: Report given to RN and Post -op Vital signs reviewed and stable  Post vital signs: Reviewed and stable  Last Vitals:  Filed Vitals:   03/23/15 1008 03/23/15 1223  BP: 124/59 112/49  Pulse: 61 65  Temp: 36.7 C 36.5 C  Resp: 20 15    Complications: No apparent anesthesia complications

## 2015-03-23 NOTE — Discharge Instructions (Signed)
[facility] ______[facility] °LAPAROSCOPIC SURGERY: POST OP INSTRUCTIONS °Always review your discharge instruction sheet given to you by the facility where your surgery was performed. °IF YOU HAVE DISABILITY OR FAMILY LEAVE FORMS, YOU MUST BRING THEM TO THE OFFICE FOR PROCESSING.   °DO NOT GIVE THEM TO YOUR DOCTOR. ° °1. A prescription for pain medication may be given to you upon discharge.  Take your pain medication as prescribed, if needed.  If narcotic pain medicine is not needed, then you may take acetaminophen (Tylenol) or ibuprofen (Advil) as needed. °2. Take your usually prescribed medications unless otherwise directed. °3. If you need a refill on your pain medication, please contact your pharmacy.  They will contact our office to request authorization. Prescriptions will not be filled after 5pm or on week-ends. °4. You should follow a light diet the first few days after arrival home, such as soup and crackers, etc.  Be sure to include lots of fluids daily. °5. Most patients will experience some swelling and bruising in the area of the incisions.  Ice packs will help.  Swelling and bruising can take several days to resolve.  °6. It is common to experience some constipation if taking pain medication after surgery.  Increasing fluid intake and taking a stool softener (such as Colace) will usually help or prevent this problem from occurring.  A mild laxative (Milk of Magnesia or Miralax) should be taken according to package instructions if there are no bowel movements after 48 hours. °7. Unless discharge instructions indicate otherwise, you may remove your bandages 24-48 hours after surgery, and you may shower at that time.  You may have steri-strips (small skin tapes) in place directly over the incision.  These strips should be left on the skin for 7-10 days.  If your surgeon used skin glue on the incision, you may shower in 24 hours.  The glue will flake off over the next 2-3 weeks.  Any sutures or  staples will be removed at the office during your follow-up visit. °8. ACTIVITIES:  You may resume regular (light) daily activities beginning the next day--such as daily self-care, walking, climbing stairs--gradually increasing activities as tolerated.  You may have sexual intercourse when it is comfortable.  Refrain from any heavy lifting or straining until approved by your doctor. °a. You may drive when you are no longer taking prescription pain medication, you can comfortably wear a seatbelt, and you can safely maneuver your car and apply brakes. °b. RETURN TO WORK:  __________________________________________________________ °9. You should see your doctor in the office for a follow-up appointment approximately 2-3 weeks after your surgery.  Make sure that you call for this appointment within a day or two after you arrive home to insure a convenient appointment time. °10. OTHER INSTRUCTIONS: __________________________________________________________________________________________________________________________ __________________________________________________________________________________________________________________________ °WHEN TO CALL YOUR DOCTOR: °1. Fever over 101.0 °2. Inability to urinate °3. Continued bleeding from incision. °4. Increased pain, redness, or drainage from the incision. °5. Increasing abdominal pain ° °The clinic staff is available to answer your questions during regular business hours.  Please don’t hesitate to call and ask to speak to one of the nurses for clinical concerns.  If you have a medical emergency, go to the nearest emergency room or call 911.  A surgeon from [facility] is always on call at the hospital. °[address], [address], [place], [place]  [address] ? [address] [address], [place], [place]   [address] °[phone] ? [phone] ? FAX [phone] °Web site:   [email or web address] °

## 2015-03-23 NOTE — Anesthesia Procedure Notes (Signed)
Procedure Name: Intubation Date/Time: 03/23/2015 11:32 AM Performed by: Maryland Pink Pre-anesthesia Checklist: Patient identified, Emergency Drugs available, Suction available, Patient being monitored and Timeout performed Patient Re-evaluated:Patient Re-evaluated prior to inductionOxygen Delivery Method: Circle system utilized Preoxygenation: Pre-oxygenation with 100% oxygen Intubation Type: IV induction Ventilation: Mask ventilation without difficulty Grade View: Grade I Tube size: 7.0 mm Number of attempts: 1 Airway Equipment and Method: Stylet Placement Confirmation: ETT inserted through vocal cords under direct vision,  breath sounds checked- equal and bilateral,  positive ETCO2 and CO2 detector Secured at: 22 cm Tube secured with: Tape Dental Injury: Teeth and Oropharynx as per pre-operative assessment

## 2015-03-24 ENCOUNTER — Encounter (HOSPITAL_COMMUNITY): Payer: Self-pay | Admitting: General Surgery

## 2015-04-08 ENCOUNTER — Ambulatory Visit: Payer: Medicare Other | Admitting: Nurse Practitioner

## 2015-05-06 ENCOUNTER — Ambulatory Visit (INDEPENDENT_AMBULATORY_CARE_PROVIDER_SITE_OTHER): Payer: Medicare Other | Admitting: Nurse Practitioner

## 2015-05-06 ENCOUNTER — Encounter: Payer: Self-pay | Admitting: Nurse Practitioner

## 2015-05-06 VITALS — BP 136/84 | HR 60 | Ht 61.0 in | Wt 164.8 lb

## 2015-05-06 DIAGNOSIS — Z720 Tobacco use: Secondary | ICD-10-CM

## 2015-05-06 DIAGNOSIS — I10 Essential (primary) hypertension: Secondary | ICD-10-CM

## 2015-05-06 DIAGNOSIS — I493 Ventricular premature depolarization: Secondary | ICD-10-CM

## 2015-05-06 MED ORDER — FLECAINIDE ACETATE 50 MG PO TABS: 150.0000 mg | ORAL_TABLET | Freq: Every day | ORAL | Status: DC

## 2015-05-06 NOTE — Patient Instructions (Addendum)
Medication Instructions:   CONTINUE SAME MEDICATIONS   If you need a refill on your cardiac medications before your next appointment, please call your pharmacy.  Labwork: NONE ORDER TODAY    Testing/Procedures:  NONE ORDER TODAY    Follow-Up:  4 MONTHS WITH ALLRED    Any Other Special Instructions Will Be Listed Below (If Applicable).

## 2015-05-06 NOTE — Progress Notes (Signed)
Electrophysiology Office Note   Date:  05/06/2015   ID:  WELDON SOHMER, DOB 1950/12/20, MRN [id number]  PCP:  [name], MD  Cardiologist:  Dr Angelena Form Primary Electrophysiologist: Allred    History of Present Illness: [name] [name] is a 65 y.o. female seen today for Dr Rayann Heman. She presents today for routine electrophysiology follow-up of PVC's.  She was intolerant of Inderal and Metoprolol. She was placed on Flecainide 100mg  bid but did not tolerate this and decreased her dose to 50mg  bid prior to last office visit with Dr Rayann Heman without optimal control of PVC's.  At that visit, her Flecainide was increased to 50mg  tid and she presents today for further evaluation.  She still has PVC's but burden is improved on current regimen. 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 She continues to smoke and was unable to afford Symbicort inhaler.   Today, she denies symptoms of chest pain, orthopnea, PND, lower extremity edema, claudication, dizziness, presyncope, syncope, bleeding, or neurologic sequela. The patient is tolerating medications without difficulties and is otherwise without complaint today.    Past Medical History  Diagnosis Date  . Depression   . GERD (gastroesophageal reflux disease)   . Allergic rhinitis   . Hyperlipidemia   . Hypertension   . Insomnia   . Dexamethasone adverse reaction     2002 normal  . Allergy     seasonal  . Anxiety   . Cataract   . Ulcer     duodenal  . Premature ventricular contractions     LBBB inferior axis PVCs  . Tobacco abuse   . COPD (chronic obstructive pulmonary disease) ([place])   . Overactive bladder   . Osteoarthritis   . Complication of anesthesia 2003    Medication for block "went up to my brain and I stopped breathing"   Past Surgical History  Procedure Laterality Date  . Tonsillectomy    . Carpal tunnel release Left     1997 left,   . Torn cartilage repaired rt wrist Right 2003  . Colonoscopy   10-17-13    per Dr. Olevia Perches, benign polyps, repeat in 10 years  . Left and right heart catheterization with coronary angiogram N/A 04/22/2014    Procedure: LEFT AND RIGHT HEART CATHETERIZATION WITH CORONARY ANGIOGRAM;  Surgeon: Burnell Blanks, MD;  Location: [facility] CATH LAB;  Service: Cardiovascular;  Laterality: N/A;  . Eus N/A 11/12/2014    Procedure: UPPER ENDOSCOPIC ULTRASOUND (EUS) LINEAR;  Surgeon: Milus Banister, MD;  Location: [facility] ENDOSCOPY;  Service: Endoscopy;  Laterality: N/A;  . Cholecystectomy N/A 03/23/2015    Procedure: LAPAROSCOPIC CHOLECYSTECTOMY;  Surgeon: Ralene Ok, MD;  Location: [facility];  Service: General;  Laterality: N/A;     Current Outpatient Prescriptions  Medication Sig Dispense Refill  . albuterol (PROVENTIL HFA;VENTOLIN HFA) 108 (90 BASE) MCG/ACT inhaler Inhale 2 puffs into the lungs every 6 (six) hours as needed for wheezing. 1 Inhaler 1  . ALPRAZolam (XANAX) 0.25 MG tablet TAKE 3 TABLETS BY MOUTH EVERY 6 HOURS AS NEEDED FOR ANXIETY 360 tablet 5  . aspirin EC 81 MG tablet Take 1 tablet (81 mg total) by mouth daily. 90 tablet 3  . atorvastatin (LIPITOR) 20 MG tablet TAKE 1 TABLET BY MOUTH EVERY DAY 30 tablet 11  . buPROPion (WELLBUTRIN XL) 150 MG 24 hr tablet Take 1 tablet (150 mg total) by mouth daily. 90 tablet 3  . cetirizine (ZYRTEC) 10 MG tablet Take  10 mg by mouth daily as needed for allergies.     . hydrochlorothiazide (HYDRODIURIL) 25 MG tablet Take 1 tablet (25 mg total) by mouth daily. 90 tablet 3  . HYDROcodone-acetaminophen (NORCO) 10-325 MG tablet Take 1 tablet by mouth every 6 (six) hours as needed for severe pain. 60 tablet 0  . HYDROcodone-acetaminophen (NORCO/VICODIN) 5-325 MG tablet Take 1 tablet by mouth every 6 (six) hours as needed for moderate pain. 30 tablet 0  . ibuprofen (ADVIL,MOTRIN) 200 MG tablet Take 400 mg by mouth every 6 (six) hours as needed for moderate pain.     Marland Kitchen lisinopril (PRINIVIL,ZESTRIL) 20 MG tablet Take 1 tablet (20 mg  total) by mouth daily. 90 tablet 3  . metoprolol (LOPRESSOR) 50 MG tablet TAKE 1 TABLET (50 MG TOTAL) BY MOUTH 2 (TWO) TIMES DAILY. 180 tablet 3  . nitroGLYCERIN (NITROSTAT) 0.4 MG SL tablet Place 0.4 mg under the tongue every 5 (five) minutes as needed for chest pain (x 3 pills).    . ondansetron (ZOFRAN) 4 MG tablet Take 1 tablet (4 mg total) by mouth every 6 (six) hours as needed for nausea or vomiting. 20 tablet 0  . pantoprazole (PROTONIX) 40 MG tablet TAKE 1 TABLET (40 MG TOTAL) BY MOUTH 2 (TWO) TIMES DAILY. 60 tablet 6  . traMADol (ULTRAM) 50 MG tablet Take 50-100 mg by mouth every 6 (six) hours as needed for moderate pain.      No current facility-administered medications for this visit.    Allergies:   Dexamethasone; Clindamycin/lincomycin; and Oxycodone-acetaminophen   Social History:  The patient  reports that she has been smoking Cigarettes.  She has a 24.3 pack-year smoking history. She has never used smokeless tobacco. She reports that she does not drink alcohol or use illicit drugs.   Family History:  The patient's  family history includes CAD in her brother; Emphysema in her mother; Kidney disease in her brother; Other in her father; Throat cancer in her brother. There is no history of Colon cancer, Esophageal cancer, Stomach cancer, or Rectal cancer.    ROS:  Please see the history of present illness.   All other systems are reviewed and negative.    PHYSICAL EXAM: VS:  BP 136/84 mmHg  Pulse 60  Ht 5\' 1"  (1.549 m)  Wt 164 lb 12.8 oz (74.753 kg)  BMI 31.15 kg/m2 , BMI Body mass index is 31.15 kg/(m^2). GEN: Well nourished, well developed, in no acute distress, smells heavily of tobacco HEENT: normal, poor dentition, Neck: no JVD, carotid bruits, or masses Cardiac: RRR no murmurs, rubs, or gallops,no edema  Respiratory:  clear to auscultation bilaterally, normal work of breathing GI: soft, nontender, nondistended, + BS MS: no deformity or atrophy Skin: warm and dry    Neuro:  Strength and sensation are intact Psych: euthymic mood, full affect  EKG:  EKG is ordered today. The ekg ordered today shows sinus rhythm, rate 60, normal intervals    Recent Labs: 09/08/2014: TSH 1.34 10/07/2014: ALT 10* 03/23/2015: BUN 17; Creatinine, Ser 1.01*; Hemoglobin 14.2; Platelets 249; Potassium 3.8; Sodium 139    Lipid Panel     Component Value Date/Time   CHOL 134 09/08/2014 0958   TRIG 96.0 09/08/2014 0958   HDL 49.70 09/08/2014 0958   CHOLHDL 3 09/08/2014 0958   VLDL 19.2 09/08/2014 0958   LDLCALC 65 09/08/2014 0958   LDLDIRECT 130.9 11/16/2008 1013     Wt Readings from Last 3 Encounters:  05/06/15 164 lb 12.8 oz (  74.753 kg)  03/23/15 161 lb 12.8 oz (73.392 kg)  03/03/15 161 lb 12.8 oz (73.392 kg)      ASSESSMENT AND PLAN:  1.  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  She is quite symptomatic with these. Unfortunately, they have been too infrequent for ablation.   Continue flecainide 50mg  TID for now  2. Tobacco Cessation encouraged today  3. HTN Stable No change required today  Current medicines are reviewed at length with the patient today.   The patient does not have concerns regarding her medicines.  The following changes were made today:  none  Follow-up with Dr Rayann Heman in 4 months   Signed, Patsey Berthold, NP  05/06/2015 11:01 AM     [facility] [address] [place] [address] [phone] (office) [phone] (fax)

## 2015-05-07 ENCOUNTER — Encounter: Payer: Self-pay | Admitting: Internal Medicine

## 2015-05-10 ENCOUNTER — Ambulatory Visit (INDEPENDENT_AMBULATORY_CARE_PROVIDER_SITE_OTHER): Payer: Medicare Other | Admitting: Family Medicine

## 2015-05-10 ENCOUNTER — Encounter: Payer: Self-pay | Admitting: Family Medicine

## 2015-05-10 VITALS — BP 128/89 | HR 60 | Temp 97.7°F | Ht 61.0 in | Wt 162.0 lb

## 2015-05-10 DIAGNOSIS — J019 Acute sinusitis, unspecified: Secondary | ICD-10-CM | POA: Diagnosis not present

## 2015-05-10 MED ORDER — AMOXICILLIN-POT CLAVULANATE 875-125 MG PO TABS: 1.0000 | ORAL_TABLET | Freq: Two times a day (BID) | ORAL | Status: DC

## 2015-05-10 MED ORDER — PREDNISONE 10 MG PO TABS: ORAL_TABLET | ORAL | Status: DC

## 2015-05-10 NOTE — Progress Notes (Signed)
Pre visit review using our clinic review tool, if applicable. No additional management support is needed unless otherwise documented below in the visit note. 

## 2015-05-10 NOTE — Progress Notes (Signed)
   Subjective:    Patient ID: [name] [name], female    DOB: 03-Jun-1950, 65 y.o.   MRN: [id number]  HPI Here for 4 days of sinus pressure, HA, PND, and coughing up green sputum. No fever. On Ibuprofen.    Review of Systems  Constitutional: Negative.   HENT: Positive for congestion, postnasal drip and sinus pressure. Negative for ear pain and sore throat.   Eyes: Negative.   Respiratory: Positive for cough.        Objective:   Physical Exam  Constitutional: She appears well-developed and well-nourished.  HENT:  Right Ear: External ear normal.  Left Ear: External ear normal.  Nose: Nose normal.  Mouth/Throat: Oropharynx is clear and moist.  Eyes: Conjunctivae are normal.  Neck: No thyromegaly present.  Cardiovascular: Normal rate, regular rhythm, normal heart sounds and intact distal pulses.   Pulmonary/Chest: Effort normal and breath sounds normal.  Lymphadenopathy:    She has no cervical adenopathy.          Assessment & Plan:  Sinusitis, treat with Augmentin and a prednisone taper. Use albuterol inhaler prn. Written out of work today until 05-17-15.

## 2015-05-14 ENCOUNTER — Emergency Department (HOSPITAL_COMMUNITY): Payer: Medicare Other

## 2015-05-14 ENCOUNTER — Encounter (HOSPITAL_COMMUNITY): Payer: Self-pay

## 2015-05-14 ENCOUNTER — Emergency Department (HOSPITAL_COMMUNITY)
Admission: EM | Admit: 2015-05-14 | Discharge: 2015-05-14 | Disposition: A | Discharge status: Home / Self Care | Payer: Medicare Other | Attending: Emergency Medicine | Admitting: Emergency Medicine

## 2015-05-14 ENCOUNTER — Telehealth: Payer: Self-pay | Admitting: Family Medicine

## 2015-05-14 DIAGNOSIS — E785 Hyperlipidemia, unspecified: Secondary | ICD-10-CM | POA: Insufficient documentation

## 2015-05-14 DIAGNOSIS — Z7951 Long term (current) use of inhaled steroids: Secondary | ICD-10-CM | POA: Diagnosis not present

## 2015-05-14 DIAGNOSIS — Z87448 Personal history of other diseases of urinary system: Secondary | ICD-10-CM | POA: Diagnosis not present

## 2015-05-14 DIAGNOSIS — Z7982 Long term (current) use of aspirin: Secondary | ICD-10-CM | POA: Diagnosis not present

## 2015-05-14 DIAGNOSIS — K219 Gastro-esophageal reflux disease without esophagitis: Secondary | ICD-10-CM | POA: Diagnosis not present

## 2015-05-14 DIAGNOSIS — Z8669 Personal history of other diseases of the nervous system and sense organs: Secondary | ICD-10-CM | POA: Diagnosis not present

## 2015-05-14 DIAGNOSIS — Z79899 Other long term (current) drug therapy: Secondary | ICD-10-CM | POA: Insufficient documentation

## 2015-05-14 DIAGNOSIS — R112 Nausea with vomiting, unspecified: Secondary | ICD-10-CM | POA: Diagnosis not present

## 2015-05-14 DIAGNOSIS — M1990 Unspecified osteoarthritis, unspecified site: Secondary | ICD-10-CM | POA: Diagnosis not present

## 2015-05-14 DIAGNOSIS — F329 Major depressive disorder, single episode, unspecified: Secondary | ICD-10-CM | POA: Insufficient documentation

## 2015-05-14 DIAGNOSIS — R6889 Other general symptoms and signs: Secondary | ICD-10-CM | POA: Diagnosis not present

## 2015-05-14 DIAGNOSIS — Z9889 Other specified postprocedural states: Secondary | ICD-10-CM | POA: Insufficient documentation

## 2015-05-14 DIAGNOSIS — J441 Chronic obstructive pulmonary disease with (acute) exacerbation: Secondary | ICD-10-CM | POA: Insufficient documentation

## 2015-05-14 DIAGNOSIS — R1084 Generalized abdominal pain: Secondary | ICD-10-CM | POA: Diagnosis not present

## 2015-05-14 DIAGNOSIS — F17210 Nicotine dependence, cigarettes, uncomplicated: Secondary | ICD-10-CM | POA: Diagnosis not present

## 2015-05-14 DIAGNOSIS — I10 Essential (primary) hypertension: Secondary | ICD-10-CM | POA: Insufficient documentation

## 2015-05-14 DIAGNOSIS — F419 Anxiety disorder, unspecified: Secondary | ICD-10-CM | POA: Diagnosis not present

## 2015-05-14 DIAGNOSIS — R1110 Vomiting, unspecified: Secondary | ICD-10-CM | POA: Diagnosis not present

## 2015-05-14 LAB — CBC WITH DIFFERENTIAL/PLATELET
BASOS ABS: 0 10*3/uL (ref 0.0–0.1)
Basophils Relative: 0 %
EOS PCT: 0 %
Eosinophils Absolute: 0 10*3/uL (ref 0.0–0.7)
HEMATOCRIT: 44.4 % (ref 36.0–46.0)
Hemoglobin: 15.2 g/dL — ABNORMAL HIGH (ref 12.0–15.0)
LYMPHS PCT: 31 %
Lymphs Abs: 2.3 10*3/uL (ref 0.7–4.0)
MCH: 31 pg (ref 26.0–34.0)
MCHC: 34.2 g/dL (ref 30.0–36.0)
MCV: 90.4 fL (ref 78.0–100.0)
MONO ABS: 0.5 10*3/uL (ref 0.1–1.0)
MONOS PCT: 6 %
NEUTROS ABS: 4.6 10*3/uL (ref 1.7–7.7)
Neutrophils Relative %: 63 %
PLATELETS: 255 10*3/uL (ref 150–400)
RBC: 4.91 MIL/uL (ref 3.87–5.11)
RDW: 13 % (ref 11.5–15.5)
WBC: 7.5 10*3/uL (ref 4.0–10.5)

## 2015-05-14 LAB — COMPREHENSIVE METABOLIC PANEL
ALBUMIN: 4 g/dL (ref 3.5–5.0)
ALT: 14 U/L (ref 14–54)
AST: 16 U/L (ref 15–41)
Alkaline Phosphatase: 57 U/L (ref 38–126)
Anion gap: 14 (ref 5–15)
BILIRUBIN TOTAL: 0.9 mg/dL (ref 0.3–1.2)
BUN: 18 mg/dL (ref 6–20)
CHLORIDE: 94 mmol/L — AB (ref 101–111)
CO2: 25 mmol/L (ref 22–32)
CREATININE: 0.68 mg/dL (ref 0.44–1.00)
Calcium: 9.2 mg/dL (ref 8.9–10.3)
GFR calc Af Amer: >60 mL/min (ref >60)
GLUCOSE: 111 mg/dL — AB (ref 65–99)
Potassium: 3.2 mmol/L — ABNORMAL LOW (ref 3.5–5.1)
Sodium: 133 mmol/L — ABNORMAL LOW (ref 135–145)
Total Protein: 7.3 g/dL (ref 6.5–8.1)

## 2015-05-14 LAB — I-STAT TROPONIN, ED: Troponin i, poc: 0 ng/mL (ref 0.00–0.08)

## 2015-05-14 LAB — LIPASE, BLOOD: LIPASE: 19 U/L (ref 11–51)

## 2015-05-14 LAB — URINALYSIS, ROUTINE W REFLEX MICROSCOPIC
BILIRUBIN URINE: NEGATIVE
GLUCOSE, UA: NEGATIVE mg/dL
HGB URINE DIPSTICK: NEGATIVE
Ketones, ur: NEGATIVE mg/dL
Leukocytes, UA: NEGATIVE
Nitrite: NEGATIVE
PH: 6.5 (ref 5.0–8.0)
Protein, ur: NEGATIVE mg/dL
SPECIFIC GRAVITY, URINE: 1.021 (ref 1.005–1.030)

## 2015-05-14 MED ORDER — GUAIFENESIN 100 MG/5ML PO LIQD: 100.0000 mg | ORAL | Status: DC | PRN

## 2015-05-14 MED ORDER — PROMETHAZINE HCL 25 MG/ML IJ SOLN
12.5000 mg | Freq: Once | INTRAMUSCULAR | Status: AC
  Administered 2015-05-14: 12.5 mg via INTRAVENOUS
  Filled 2015-05-14: qty 1

## 2015-05-14 MED ORDER — SODIUM CHLORIDE 0.9 % IV BOLUS (SEPSIS)
1000.0000 mL | Freq: Once | INTRAVENOUS | Status: AC
  Administered 2015-05-14: 1000 mL via INTRAVENOUS

## 2015-05-14 MED ORDER — PROMETHAZINE HCL 25 MG PO TABS: 25.0000 mg | ORAL_TABLET | Freq: Three times a day (TID) | ORAL | Status: DC | PRN

## 2015-05-14 NOTE — ED Notes (Signed)
Patient desires to ambulate to the restroom versus getting an In and out cath. Patient requesting ginger ale at this time.

## 2015-05-14 NOTE — ED Notes (Signed)
Patient drinking ginger ale at this time. Patient tolerating fluids without vomiting. Patient reports feeling "cold", blanket provided to patient at this time.

## 2015-05-14 NOTE — Telephone Encounter (Signed)
I see she is in the ED now.

## 2015-05-14 NOTE — ED Notes (Addendum)
Attempted to discharge pt. She stated she doesn't know how she is going to get home or get her meds because her usual ride is also sick. Given phone to make calls to find ride. Updated primary nurse of same.

## 2015-05-14 NOTE — ED Notes (Signed)
Pt stated she did not want to get up to give urine sample

## 2015-05-14 NOTE — ED Notes (Signed)
Patient dry heaving in room, was given 4mg  of IV Zofran prior to arrival in EMS.

## 2015-05-14 NOTE — Discharge Instructions (Signed)
Drink plenty of fluids.    Nausea and Vomiting Nausea is a sick feeling that often comes before throwing up (vomiting). Vomiting is a reflex where stomach contents come out of your mouth. Vomiting can cause severe loss of body fluids (dehydration). Children and elderly adults can become dehydrated quickly, especially if they also have diarrhea. Nausea and vomiting are symptoms of a condition or disease. It is important to find the cause of your symptoms. CAUSES   Direct irritation of the stomach lining. This irritation can result from increased acid production (gastroesophageal reflux disease), infection, food poisoning, taking certain medicines (such as nonsteroidal anti-inflammatory drugs), alcohol use, or tobacco use.  Signals from the brain.These signals could be caused by a headache, heat exposure, an inner ear disturbance, increased pressure in the brain from injury, infection, a tumor, or a concussion, pain, emotional stimulus, or metabolic problems.  An obstruction in the gastrointestinal tract (bowel obstruction).  Illnesses such as diabetes, hepatitis, gallbladder problems, appendicitis, kidney problems, cancer, sepsis, atypical symptoms of a heart attack, or eating disorders.  Medical treatments such as chemotherapy and radiation.  Receiving medicine that makes you sleep (general anesthetic) during surgery. DIAGNOSIS Your caregiver may ask for tests to be done if the problems do not improve after a few days. Tests may also be done if symptoms are severe or if the reason for the nausea and vomiting is not clear. Tests may include:  Urine tests.  Blood tests.  Stool tests.  Cultures (to look for evidence of infection).  X-rays or other imaging studies. Test results can help your caregiver make decisions about treatment or the need for additional tests. TREATMENT You need to stay well hydrated. Drink frequently but in small amounts.You may wish to drink water, sports drinks,  clear broth, or eat frozen ice pops or gelatin dessert to help stay hydrated.When you eat, eating slowly may help prevent nausea.There are also some antinausea medicines that may help prevent nausea. HOME CARE INSTRUCTIONS   Take all medicine as directed by your caregiver.  If you do not have an appetite, do not force yourself to eat. However, you must continue to drink fluids.  If you have an appetite, eat a normal diet unless your caregiver tells you differently.  Eat a variety of complex carbohydrates (rice, wheat, potatoes, bread), lean meats, yogurt, fruits, and vegetables.  Avoid high-fat foods because they are more difficult to digest.  Drink enough water and fluids to keep your urine clear or pale yellow.  If you are dehydrated, ask your caregiver for specific rehydration instructions. Signs of dehydration may include:  Severe thirst.  Dry lips and mouth.  Dizziness.  Dark urine.  Decreasing urine frequency and amount.  Confusion.  Rapid breathing or pulse. SEEK IMMEDIATE MEDICAL CARE IF:   You have blood or brown flecks (like coffee grounds) in your vomit.  You have black or bloody stools.  You have a severe headache or stiff neck.  You are confused.  You have severe abdominal pain.  You have chest pain or trouble breathing.  You do not urinate at least once every 8 hours.  You develop cold or clammy skin.  You continue to vomit for longer than 24 to 48 hours.  You have a fever. MAKE SURE YOU:   Understand these instructions.  Will watch your condition.  Will get help right away if you are not doing well or get worse.   This information is not intended to replace advice given to you  by your health care provider. Make sure you discuss any questions you have with your health care provider.   Document Released: 03/20/2005 Document Revised: 06/12/2011 Document Reviewed: 08/17/2010 Elsevier Interactive Patient Education 2016 Elsevier Inc[employer]  Full  Liquid Diet A full liquid diet may be used:   To help you transition from a clear liquid diet to a soft diet.   When your body is healing and can only tolerate foods that are easy to digest.  Before or after certain a procedure, test, or surgery (such as stomach or intestinal surgeries).   If you have trouble swallowing or chewing.  A full liquid diet includes fluids and foods that are liquid or will become liquid at room temperature. The full liquid diet gives you the proteins, fluids, salts, and minerals that you need for energy. If you continue this diet for more than 72 hours, talk to your health care provider about how many calories you need to consume. If you continue the diet for more than 5 days, talk to your health care provider about taking a multivitamin or a nutritional supplement. WHAT DO I NEED TO KNOW ABOUT A FULL LIQUID DIET?  You may have any liquid.  You may have any food that becomes a liquid at room temperature. The food is considered a liquid if it can be poured off a spoon at room temperature.  Drink one serving of citrus or vitamin C-enriched fruit juice daily. WHAT FOODS CAN I EAT? Grains Any grain food that can be pureed in soup (such as crackers, pasta, and rice). Hot cereal (such as farina or oatmeal) that has been blended. Talk to your health care provider or dietitian about these foods. Vegetables Pulp-free tomato or vegetable juice. Vegetables pureed in soup.  Fruits Fruit juice, including nectars and juices with pulp. Meats and Other Protein Sources Eggs in custard, eggnog mix, and eggs used in ice cream or pudding. Strained meats, like in baby food, may be allowed. Consult your health care provider.  Dairy Milk and milk-based beverages, including milk shakes and instant breakfast mixes. Smooth yogurt. Pureed cottage cheese. Avoid these foods if they are not well tolerated. Beverages All beverages, including liquid nutritional supplements. Ask your  health care provider if you can have carbonated beverages. They may not be well tolerated. Condiments Iodized salt, pepper, spices, and flavorings. Cocoa powder. Vinegar, ketchup, yellow mustard, smooth sauces (such as hollandaise, cheese sauce, or white sauce), and soy sauce. Sweets and Desserts Custard, smooth pudding. Flavored gelatin. Tapioca, junket. Plain ice cream, sherbet, fruit ices. Frozen ice pops, frozen fudge pops, pudding pops, and other frozen bars with cream. Syrups, including chocolate syrup. Sugar, honey, jelly.  Fats and Oils Margarine, butter, cream, sour cream, and oils. Other Broth and cream soups. Strained, broth-based soups. The items listed above may not be a complete list of recommended foods or beverages. Contact your dietitian for more options.  WHAT FOODS CAN I NOT EAT? Grains All breads. Grains are not allowed unless they are pureed into soup. Vegetables Vegetables are not allowed unless they are juiced, or cooked and pureed into soup. Fruits Fruits are not allowed unless they are juiced. Meats and Other Protein Sources Any meat or fish. Cooked or raw eggs. Nut butters.  Dairy Cheese.  Condiments Stone ground mustards. Fats and Oils Fats that are coarse or chunky. Sweets and Desserts Ice cream or other frozen desserts that have any solids in them or on top, such as nuts, chocolate chips, and pieces of  cookies. Cakes. Cookies. Candy. Others Soups with chunks or pieces in them. The items listed above may not be a complete list of foods and beverages to avoid. Contact your dietitian for more information.   This information is not intended to replace advice given to you by your health care provider. Make sure you discuss any questions you have with your health care provider.   Document Released: 03/20/2005 Document Revised: 03/25/2013 Document Reviewed: 01/23/2013 Elsevier Interactive Patient Education [employer].

## 2015-05-14 NOTE — ED Provider Notes (Signed)
CSN: [id number]     Arrival date & time 05/14/15  1111 History   First MD Initiated Contact with Patient 05/14/15 1159     Chief Complaint  Patient presents with  . Emesis     Patient is a 65 y.o. female presenting with vomiting. The history is provided by the patient. No language interpreter was used.  Emesis  [name] [name] is a 65 y.o. female who presents to the Emergency Department complaining of vomiting.  She is 3 weeks status post cholecystectomy. She developed upper respiratory symptoms with nasal congestion and cough 6 days ago and was seen by her PCP and started on steroids and Augmentin. 2 days ago she started with significant nausea and vomiting. She is unable to tolerate any oral fluids. No diarrhea, constipation. Last bowel movement was 3 days ago. She is passing a small amount of flatus. She had a temperature to 99.9 three days ago. She is not taking any of her home medications due to continued vomiting.  Past Medical History  Diagnosis Date  . Depression   . GERD (gastroesophageal reflux disease)   . Allergic rhinitis   . Hyperlipidemia   . Hypertension   . Insomnia   . Dexamethasone adverse reaction     2002 normal  . Allergy     seasonal  . Anxiety   . Cataract   . Ulcer     duodenal  . Premature ventricular contractions     LBBB inferior axis PVCs  . Tobacco abuse   . COPD (chronic obstructive pulmonary disease) ([place])   . Overactive bladder   . Osteoarthritis   . Complication of anesthesia 2003    Medication for block "went up to my brain and I stopped breathing"   Past Surgical History  Procedure Laterality Date  . Tonsillectomy    . Carpal tunnel release Left     1997 left,   . Torn cartilage repaired rt wrist Right 2003  . Colonoscopy  10-17-13    per Dr. Olevia Perches, benign polyps, repeat in 10 years  . Left and right heart catheterization with coronary angiogram N/A 04/22/2014    Procedure: LEFT AND RIGHT HEART CATHETERIZATION WITH CORONARY ANGIOGRAM;   Surgeon: Burnell Blanks, MD;  Location: [facility] CATH LAB;  Service: Cardiovascular;  Laterality: N/A;  . Eus N/A 11/12/2014    Procedure: UPPER ENDOSCOPIC ULTRASOUND (EUS) LINEAR;  Surgeon: Milus Banister, MD;  Location: [facility] ENDOSCOPY;  Service: Endoscopy;  Laterality: N/A;  . Cholecystectomy N/A 03/23/2015    Procedure: LAPAROSCOPIC CHOLECYSTECTOMY;  Surgeon: Ralene Ok, MD;  Location: [facility] OR;  Service: General;  Laterality: N/A;   Family History  Problem Relation Age of Onset  . Hyperlipidemia    . Hypertension    . Kidney disease    . Heart disease    . CAD Brother   . Kidney disease Brother   . Emphysema Mother   . Colon cancer Neg Hx   . Esophageal cancer Neg Hx   . Stomach cancer Neg Hx   . Rectal cancer Neg Hx   . Other Father     brain tumor  . Throat cancer Brother    Social History  Substance Use Topics  . Smoking status: Current Every Day Smoker -- 0.45 packs/day for 54 years    Types: Cigarettes  . Smokeless tobacco: Never Used     Comment: 1/2 pack per day or less  . Alcohol Use: No   OB History    No  data available     Review of Systems  Gastrointestinal: Positive for vomiting.  All other systems reviewed and are negative.     Allergies  Dexamethasone; Clindamycin/lincomycin; and Oxycodone-acetaminophen  Home Medications   Prior to Admission medications   Medication Sig Start Date End Date Taking? Authorizing Provider  albuterol (PROVENTIL HFA;VENTOLIN HFA) 108 (90 BASE) MCG/ACT inhaler Inhale 2 puffs into the lungs every 6 (six) hours as needed for wheezing. 05/30/14 05/30/15 Yes Midge Minium, MD  ALPRAZolam Duanne Moron) 0.25 MG tablet TAKE 3 TABLETS BY MOUTH EVERY 6 HOURS AS NEEDED FOR ANXIETY 12/30/14  Yes [name], MD  aspirin EC 81 MG tablet Take 1 tablet (81 mg total) by mouth daily. 01/18/15  Yes Burnell Blanks, MD  atorvastatin (LIPITOR) 20 MG tablet TAKE 1 TABLET BY MOUTH EVERY DAY 10/07/14  Yes [name], MD  buPROPion  (WELLBUTRIN XL) 150 MG 24 hr tablet Take 1 tablet (150 mg total) by mouth daily. 09/16/14  Yes [name], MD  cetirizine (ZYRTEC) 10 MG tablet Take 10 mg by mouth daily as needed for allergies.    Yes Historical Provider, MD  flecainide (TAMBOCOR) 50 MG tablet Take 3 tablets (150 mg total) by mouth daily. 05/06/15  Yes Amber Sena Slate, NP  hydrochlorothiazide (HYDRODIURIL) 25 MG tablet Take 1 tablet (25 mg total) by mouth daily. 10/23/14  Yes [name], MD  HYDROcodone-acetaminophen (NORCO/VICODIN) 5-325 MG tablet Take 1 tablet by mouth every 6 (six) hours as needed for moderate pain. 03/23/15  Yes Ralene Ok, MD  ibuprofen (ADVIL,MOTRIN) 200 MG tablet Take 400 mg by mouth every 6 (six) hours as needed for moderate pain.    Yes Historical Provider, MD  lisinopril (PRINIVIL,ZESTRIL) 20 MG tablet Take 1 tablet (20 mg total) by mouth daily. 09/16/14  Yes [name], MD  metoprolol (LOPRESSOR) 50 MG tablet TAKE 1 TABLET (50 MG TOTAL) BY MOUTH 2 (TWO) TIMES DAILY. 01/18/15  Yes Burnell Blanks, MD  nitroGLYCERIN (NITROSTAT) 0.4 MG SL tablet Place 0.4 mg under the tongue every 5 (five) minutes as needed for chest pain (x 3 pills). Reported on 05/10/2015   Yes Historical Provider, MD  ondansetron (ZOFRAN) 4 MG tablet Take 4 mg by mouth every 8 (eight) hours as needed for nausea or vomiting.   Yes Historical Provider, MD  pantoprazole (PROTONIX) 40 MG tablet TAKE 1 TABLET (40 MG TOTAL) BY MOUTH 2 (TWO) TIMES DAILY. 01/26/15  Yes [name], MD  predniSONE (DELTASONE) 10 MG tablet Take 4 tabs a day for 4 days, then 3 a day for 4 days, then 2 a day for 4 days, then 1 a day for 4 days, then stop 05/10/15  Yes [name], MD  SYMBICORT 160-4.5 MCG/ACT inhaler Inhale 2 puffs into the lungs 2 (two) times daily. 03/13/15  Yes Historical Provider, MD  amoxicillin-clavulanate (AUGMENTIN) 875-125 MG tablet Take 1 tablet by mouth 2 (two) times daily. 05/10/15   [name], MD  guaiFENesin (ROBITUSSIN)  100 MG/5ML liquid Take 5-10 mLs (100-200 mg total) by mouth every 4 (four) hours as needed for cough. 05/14/15   Quintella Reichert, MD  promethazine (PHENERGAN) 25 MG tablet Take 1 tablet (25 mg total) by mouth every 8 (eight) hours as needed for nausea or vomiting. 05/14/15   Quintella Reichert, MD   BP 140/78 mmHg  Pulse 74  Temp(Src) 98.3 F (36.8 C) (Oral)  Resp 17  SpO2 95% Physical Exam  Constitutional:  She is oriented to person, place, and time. She appears well-developed and well-nourished.  HENT:  Head: Normocephalic and atraumatic.  Cardiovascular: Normal rate and regular rhythm.   No murmur heard. Pulmonary/Chest: Effort normal and breath sounds normal. No respiratory distress.  Abdominal: Soft. There is no rebound and no guarding.  Mild diffuse abdominal tenderness  Musculoskeletal: She exhibits no edema or tenderness.  Neurological: She is alert and oriented to person, place, and time.  Skin: Skin is warm and dry.  Psychiatric: She has a normal mood and affect. Her behavior is normal.  Nursing note and vitals reviewed.   ED Course  Procedures (including critical care time) Labs Review Labs Reviewed  COMPREHENSIVE METABOLIC PANEL - Abnormal; Notable for the following:    Sodium 133 (*)    Potassium 3.2 (*)    Chloride 94 (*)    Glucose, Bld 111 (*)    All other components within normal limits  CBC WITH DIFFERENTIAL/PLATELET - Abnormal; Notable for the following:    Hemoglobin 15.2 (*)    All other components within normal limits  LIPASE, BLOOD  URINALYSIS, ROUTINE W REFLEX MICROSCOPIC (NOT AT [facility])  I-STAT TROPOININ, ED    Imaging Review Dg Abd Acute W/chest  05/14/2015  CLINICAL DATA:  Vomiting, cough, sinus infection EXAM: DG ABDOMEN ACUTE W/ 1V CHEST COMPARISON:  CT abdomen 10/19/2014 FINDINGS: There is no evidence of dilated bowel loops or free intraperitoneal air. No radiopaque calculi or other significant radiographic abnormality is seen. Heart size and  mediastinal contours are within normal limits. Both lungs are clear. There is an S-shaped scoliosis of the thoracolumbar spine. IMPRESSION: Negative abdominal radiographs.  No acute cardiopulmonary disease. Electronically Signed   By: Kathreen Devoid   On: 05/14/2015 13:24   I have personally reviewed and evaluated these images and lab results as part of my medical decision-making.   EKG Interpretation None      MDM   Final diagnoses:  Non-intractable vomiting with nausea, vomiting of unspecified type   Patient with history of COPD here with vomiting. Recently started on prednisone and Augmentin for respiratory symptoms. She has no respiratory distress in the ER. No evidence of acute infectious process or pneumonia. Presentation is not consistent with acute abdomen or bowel obstruction. Discussed with patient discontinuing the antibiotics and steroids for now as this may be contributed to her vomiting. Providing prescription for Phenergan as patient states Zofran does not work for her at home. Discussed outpatient follow-up as well as close return precautions.    Quintella Reichert, MD 05/14/15 [phone]

## 2015-05-14 NOTE — ED Notes (Signed)
Patient not actively vomiting. Patient reports some nausea. Patient ambulated to [facility] and wheelchair used for halfway for patient to sit in in [place] while waiting for ride. Patient A&Ox4 and in NAD.

## 2015-05-14 NOTE — Telephone Encounter (Signed)
Please advise 

## 2015-05-14 NOTE — ED Notes (Signed)
Patient presents to ED with vomiting. Patient was seen at PCP on Monday and given an antibiotic and prednisone. Patient began vomiting Wednesday and has been unable to keep anything down. Patient called PCP who referred her to come to ED.

## 2015-05-14 NOTE — ED Notes (Signed)
Patient transported to X-ray 

## 2015-05-14 NOTE — Telephone Encounter (Signed)
Patient Name: [name] [name] DOB: [birth year] Initial Comment Caller states she was seen on Monday- Sinus infection. She's vomiting. Nurse Assessment Nurse: Vallery Sa, RN, Cathy Date/Time (Eastern Time): 05/14/2015 10:19:31 AM Confirm and document reason for call. If symptomatic, describe symptoms. You must click the next button to save text entered. ---Caller states [name] [name] started antibiotics for Sinus infection and Bronchitis 4 days ago and continues to have symptoms. She developed vomiting 2 nights (multiple episodes in the past 24 hours). No diarrhea. She has had moderate grade fever for the past 4 days. No severe breathing difficulty. Alert and responsive. Has the patient traveled out of the country within the last 30 days? ---No Does the patient have any new or worsening symptoms? ---Yes Will a triage be completed? ---Yes Related visit to physician within the last 2 weeks? ---Yes Does the PT have any chronic conditions? (i.e. diabetes, asthma, etc.) ---Yes List chronic conditions. ---Bronchitis, Sinus infections, High Blood Pressure, Irregular heart rhythms, High Cholesterol, Back pain, Depression, Anxiety, Nausea, COPD Is this a behavioral health or substance abuse call? ---No Guidelines Guideline Title Affirmed Question Affirmed Notes Sinus Infection on Antibiotic Follow-up Call [1] SEVERE headache AND [2] fever Final Disposition User Go to ED Now (or PCP triage) Vallery Sa, RN, Cathy Referrals [facility] - ED Disagree/Comply: Comply Carla's SPO2 level was 93 during a bad coughing/vomiting episode. Recheck shows it is 96 at this time. No severe breathing difficulty or blueness around her lips.

## 2015-05-17 ENCOUNTER — Encounter: Payer: Self-pay | Admitting: Family Medicine

## 2015-05-17 ENCOUNTER — Ambulatory Visit (INDEPENDENT_AMBULATORY_CARE_PROVIDER_SITE_OTHER): Payer: Medicare Other | Admitting: Family Medicine

## 2015-05-17 VITALS — BP 157/97 | HR 66 | Temp 98.4°F | Ht 61.0 in | Wt 158.0 lb

## 2015-05-17 DIAGNOSIS — J069 Acute upper respiratory infection, unspecified: Secondary | ICD-10-CM

## 2015-05-17 DIAGNOSIS — J449 Chronic obstructive pulmonary disease, unspecified: Secondary | ICD-10-CM

## 2015-05-17 DIAGNOSIS — R112 Nausea with vomiting, unspecified: Secondary | ICD-10-CM

## 2015-05-17 NOTE — Progress Notes (Signed)
   Subjective:    Patient ID: [name] [name], female    DOB: October 18, 1950, 65 y.o.   MRN: [id number]  HPI Here to follow up an ER visit on 05-14-15 for nausea and vomiting. She was seen her on 05-11-15 and diagnosed with bronchitis. She was started on Augmentin and prednisone. She then developed the nausea and vomiting, but at the time did not have diarrhea. At the ER it was felt tat this was a reaction to the Augmentin and prednisone, so she was told to stop taking both of these medications. She did stop the Augementin, but she started back on prednisone the very next day because she was coughing and was SOB. Since then the nausea has stopped, her appetite is normal, and she is eating an drinking normally. She is still coughing but this has improved. She is still a little weak.    Review of Systems  Constitutional: Positive for fatigue. Negative for fever.  HENT: Negative.   Eyes: Negative.   Respiratory: Positive for cough, chest tightness, shortness of breath and wheezing.   Cardiovascular: Negative.   Gastrointestinal: Negative.        Objective:   Physical Exam  Constitutional: She appears well-developed and well-nourished.  HENT:  Right Ear: External ear normal.  Left Ear: External ear normal.  Nose: Nose normal.  Mouth/Throat: Oropharynx is clear and moist.  Eyes: Conjunctivae are normal.  Neck: Neck supple. No thyromegaly present.  Cardiovascular: Normal rate, regular rhythm, normal heart sounds and intact distal pulses.   Pulmonary/Chest: Effort normal. No respiratory distress. She has no rales.  Scattered wheezes and rhonchi   Abdominal: Soft. Bowel sounds are normal. She exhibits no distension and no mass. There is no tenderness. There is no rebound and no guarding.  Lymphadenopathy:    She has no cervical adenopathy.          Assessment & Plan:  Her recent nausea and vomiting appear to have been side effects of Augmentin, and these have resolved since she stopped taking  it. Her coughing persists, and this appears to be lingering effects of a viral URI on top of her COPD. She is using her inhaler prn. Recheck prn. She will try to return to work tonight.

## 2015-05-17 NOTE — Progress Notes (Signed)
Pre visit review using our clinic review tool, if applicable. No additional management support is needed unless otherwise documented below in the visit note. 

## 2015-05-20 DIAGNOSIS — H524 Presbyopia: Secondary | ICD-10-CM | POA: Diagnosis not present

## 2015-05-20 DIAGNOSIS — H43813 Vitreous degeneration, bilateral: Secondary | ICD-10-CM | POA: Diagnosis not present

## 2015-05-20 DIAGNOSIS — H52223 Regular astigmatism, bilateral: Secondary | ICD-10-CM | POA: Diagnosis not present

## 2015-05-20 DIAGNOSIS — H5202 Hypermetropia, left eye: Secondary | ICD-10-CM | POA: Diagnosis not present

## 2015-05-20 DIAGNOSIS — H2513 Age-related nuclear cataract, bilateral: Secondary | ICD-10-CM | POA: Diagnosis not present

## 2015-06-21 ENCOUNTER — Encounter: Payer: Self-pay | Admitting: Family Medicine

## 2015-06-21 ENCOUNTER — Ambulatory Visit (INDEPENDENT_AMBULATORY_CARE_PROVIDER_SITE_OTHER): Payer: Medicare Other | Admitting: Family Medicine

## 2015-06-21 VITALS — BP 99/75 | HR 70 | Temp 98.2°F | Ht 61.0 in | Wt 156.0 lb

## 2015-06-21 DIAGNOSIS — B349 Viral infection, unspecified: Secondary | ICD-10-CM

## 2015-06-21 MED ORDER — PROCHLORPERAZINE MALEATE 10 MG PO TABS: 10.0000 mg | ORAL_TABLET | Freq: Four times a day (QID) | ORAL | Status: DC | PRN

## 2015-06-21 MED ORDER — PROMETHAZINE HCL 50 MG/ML IJ SOLN
50.0000 mg | Freq: Once | INTRAMUSCULAR | Status: AC
  Administered 2015-06-21: 50 mg via INTRAMUSCULAR

## 2015-06-21 NOTE — Progress Notes (Signed)
Pre visit review using our clinic review tool, if applicable. No additional management support is needed unless otherwise documented below in the visit note. 

## 2015-06-21 NOTE — Progress Notes (Signed)
   Subjective:    Patient ID: [name] [name], female    DOB: January 02, 1951, 65 y.o.   MRN: [id number]  HPI Here for one week of intermittent nausea, sometimes with vomiting. Also she has had body aches and a fever. She notes that Phenergan and Zofran do not work well for her.    Review of Systems  Constitutional: Positive for fatigue. Negative for fever.  HENT: Positive for congestion.   Eyes: Negative.   Respiratory: Positive for cough and choking.        Objective:   Physical Exam  Constitutional: She is oriented to person, place, and time. She appears well-developed and well-nourished.  HENT:  Right Ear: External ear normal.  Left Ear: External ear normal.  Nose: Nose normal.  Mouth/Throat: Oropharynx is clear and moist.  Eyes: Conjunctivae are normal.  Neck: No thyromegaly present.  Pulmonary/Chest: Effort normal and breath sounds normal.  Lymphadenopathy:    She has no cervical adenopathy.  Neurological: She is alert and oriented to person, place, and time.          Assessment & Plan:  Viral illness. Use Compazine for nausea. Drink fluids.

## 2015-06-24 ENCOUNTER — Telehealth: Payer: Self-pay | Admitting: Family Medicine

## 2015-06-24 NOTE — Telephone Encounter (Signed)
Patient informed of the message below.

## 2015-06-24 NOTE — Telephone Encounter (Signed)
Pt seen Monday and dx with the flu.  Pt states on Tuesdat pt started with severe acid reflux. Pt already takes pantoprazole (PROTONIX) 40 MG tablet, 1/ BID , and also took Mylanta.  Is that OK to take these 2 together? Pt states it feels like her throat is on fire. Pt would like to know if there is something else she can take or to increase what she has.  [place]

## 2015-06-24 NOTE — Telephone Encounter (Signed)
It is okay to take Protonix and Mylanta together. Try adding Cloraseptic lozenges for the sore throat

## 2015-06-28 ENCOUNTER — Ambulatory Visit: Payer: Medicare Other | Admitting: Family Medicine

## 2015-07-02 ENCOUNTER — Ambulatory Visit (INDEPENDENT_AMBULATORY_CARE_PROVIDER_SITE_OTHER): Payer: Medicare Other | Admitting: Family Medicine

## 2015-07-02 ENCOUNTER — Encounter: Payer: Self-pay | Admitting: Family Medicine

## 2015-07-02 VITALS — BP 131/90 | HR 69 | Temp 98.7°F | Ht 61.0 in | Wt 163.0 lb

## 2015-07-02 DIAGNOSIS — H6123 Impacted cerumen, bilateral: Secondary | ICD-10-CM | POA: Diagnosis not present

## 2015-07-02 MED ORDER — HYDROCODONE-ACETAMINOPHEN 5-325 MG PO TABS: 1.0000 | ORAL_TABLET | Freq: Four times a day (QID) | ORAL | Status: DC | PRN

## 2015-07-02 MED ORDER — ALBUTEROL SULFATE HFA 108 (90 BASE) MCG/ACT IN AERS: 2.0000 | INHALATION_SPRAY | Freq: Four times a day (QID) | RESPIRATORY_TRACT | Status: DC | PRN

## 2015-07-02 NOTE — Progress Notes (Signed)
Pre visit review using our clinic review tool, if applicable. No additional management support is needed unless otherwise documented below in the visit note. 

## 2015-07-02 NOTE — Progress Notes (Signed)
   Subjective:    Patient ID: [name] [name], female    DOB: 10/19/1950, 65 y.o.   MRN: [id number]  HPI Here for decreased hearing in both ears. No pain or sinus congestion. She recovered from the viral infection she had last week.   Review of Systems  Constitutional: Negative.   HENT: Positive for hearing loss. Negative for congestion, ear pain, postnasal drip, sinus pressure and sore throat.   Eyes: Negative.   Respiratory: Negative.        Objective:   Physical Exam  Constitutional: She appears well-developed and well-nourished.  HENT:  Nose: Nose normal.  Mouth/Throat: Oropharynx is clear and moist.  Both ear canals are full of cerumen   Eyes: Conjunctivae are normal.  Neck: No thyromegaly present.  Lymphadenopathy:    She has no cervical adenopathy.          Assessment & Plan:  Cerumen impactions, irrigated clear with water.  [name], MD

## 2015-07-07 DIAGNOSIS — H2513 Age-related nuclear cataract, bilateral: Secondary | ICD-10-CM | POA: Diagnosis not present

## 2015-07-07 DIAGNOSIS — H43813 Vitreous degeneration, bilateral: Secondary | ICD-10-CM | POA: Diagnosis not present

## 2015-07-07 DIAGNOSIS — H5711 Ocular pain, right eye: Secondary | ICD-10-CM | POA: Diagnosis not present

## 2015-07-08 ENCOUNTER — Other Ambulatory Visit: Payer: Self-pay | Admitting: Family Medicine

## 2015-07-09 NOTE — Telephone Encounter (Signed)
Call in #[phone] with 5 rf 

## 2015-07-19 DIAGNOSIS — H5711 Ocular pain, right eye: Secondary | ICD-10-CM | POA: Diagnosis not present

## 2015-07-19 DIAGNOSIS — H40053 Ocular hypertension, bilateral: Secondary | ICD-10-CM | POA: Diagnosis not present

## 2015-08-06 ENCOUNTER — Ambulatory Visit: Payer: Medicare Other | Admitting: Cardiovascular Disease

## 2015-08-09 DIAGNOSIS — H5711 Ocular pain, right eye: Secondary | ICD-10-CM | POA: Diagnosis not present

## 2015-08-09 DIAGNOSIS — H40053 Ocular hypertension, bilateral: Secondary | ICD-10-CM | POA: Diagnosis not present

## 2015-08-09 DIAGNOSIS — H2513 Age-related nuclear cataract, bilateral: Secondary | ICD-10-CM | POA: Diagnosis not present

## 2015-08-10 ENCOUNTER — Ambulatory Visit: Payer: Medicare Other | Admitting: Physician Assistant

## 2015-08-24 ENCOUNTER — Other Ambulatory Visit: Payer: Self-pay | Admitting: Family Medicine

## 2015-09-01 ENCOUNTER — Ambulatory Visit: Payer: Medicare Other | Admitting: Cardiology

## 2015-09-02 ENCOUNTER — Other Ambulatory Visit: Payer: Self-pay | Admitting: Family Medicine

## 2015-09-07 ENCOUNTER — Other Ambulatory Visit: Payer: Self-pay | Admitting: Family Medicine

## 2015-09-08 ENCOUNTER — Other Ambulatory Visit: Payer: Self-pay

## 2015-09-08 ENCOUNTER — Ambulatory Visit: Payer: Medicare Other | Admitting: Internal Medicine

## 2015-09-08 MED ORDER — LISINOPRIL 20 MG PO TABS: 20.0000 mg | ORAL_TABLET | Freq: Every day | ORAL | Status: DC

## 2015-09-13 ENCOUNTER — Ambulatory Visit (INDEPENDENT_AMBULATORY_CARE_PROVIDER_SITE_OTHER): Payer: Medicare Other | Admitting: Internal Medicine

## 2015-09-13 ENCOUNTER — Encounter: Payer: Self-pay | Admitting: Internal Medicine

## 2015-09-13 VITALS — BP 110/72 | HR 63 | Ht 61.0 in | Wt 163.6 lb

## 2015-09-13 DIAGNOSIS — I493 Ventricular premature depolarization: Secondary | ICD-10-CM

## 2015-09-13 DIAGNOSIS — I10 Essential (primary) hypertension: Secondary | ICD-10-CM

## 2015-09-13 NOTE — Patient Instructions (Signed)
Medication Instructions:  Your physician recommends that you continue on your current medications as directed. Please refer to the Current Medication list given to you today.   Labwork: None ordered   Testing/Procedures: None ordered   Follow-Up: Your physician wants you to follow-up in: 8 months with Chanetta Marshall, NP You will receive a reminder letter in the mail two months in advance. If you don't receive a letter, please call our office to schedule the follow-up appointment.   Any Other Special Instructions Will Be Listed Below (If Applicable).     If you need a refill on your cardiac medications before your next appointment, please call your pharmacy.

## 2015-09-13 NOTE — Progress Notes (Signed)
Electrophysiology Office Note   Date:  09/13/2015   ID:  [name] [name], DOB 1950/11/11, MRN [id number]  PCP:  [name], MD  Cardiologist:  Dr Angelena Form Primary Electrophysiologist: Thompson Grayer, MD    Chief Complaint  Patient presents with  . PVC     History of Present Illness: [name] [name] is a 65 y.o. female who presents today for electrophysiology follow-up.   She continues to have occasional PVCs with fatigue and weakness.  With nadolol, episodes have improved but not resolved.   She is s/p heart cath on 04/22/14. She was found to have a moderate mid LAD stenosis (50-60% stenosis). FFR was 0.82-0.85 suggesting this stenosis was not flow limiting. The Circumflex and RCA were free of significant disease. She was placed on flecainide with some improvement but not resolution of PVCs.  She did note tolerate 100mg  BID very well and has reduced her dose to 50mg  BID.  She takes a single additional 50mg  dose (up to 50mg  TID) when needed.  Today, she denies symptoms of  chest pain,   orthopnea, PND, lower extremity edema, claudication, dizziness, presyncope, syncope, bleeding, or neurologic sequela. The patient is tolerating medications without difficulties and is otherwise without complaint today.    Past Medical History  Diagnosis Date  . Depression   . GERD (gastroesophageal reflux disease)   . Allergic rhinitis   . Hyperlipidemia   . Hypertension   . Insomnia   . Dexamethasone adverse reaction     2002 normal  . Allergy     seasonal  . Anxiety   . Cataract   . Ulcer     duodenal  . Premature ventricular contractions     LBBB inferior axis PVCs  . Tobacco abuse   . COPD (chronic obstructive pulmonary disease) ([place])   . Overactive bladder   . Osteoarthritis   . Complication of anesthesia 2003    Medication for block "went up to my brain and I stopped breathing"   Past Surgical History  Procedure Laterality Date  . Tonsillectomy    . Carpal tunnel release Left       1997 left,   . Torn cartilage repaired rt wrist Right 2003  . Colonoscopy  10-17-13    per Dr. Olevia Perches, benign polyps, repeat in 10 years  . Left and right heart catheterization with coronary angiogram N/A 04/22/2014    Procedure: LEFT AND RIGHT HEART CATHETERIZATION WITH CORONARY ANGIOGRAM;  Surgeon: Burnell Blanks, MD;  Location: [facility] CATH LAB;  Service: Cardiovascular;  Laterality: N/A;  . Eus N/A 11/12/2014    Procedure: UPPER ENDOSCOPIC ULTRASOUND (EUS) LINEAR;  Surgeon: Milus Banister, MD;  Location: [facility] ENDOSCOPY;  Service: Endoscopy;  Laterality: N/A;  . Cholecystectomy N/A 03/23/2015    Procedure: LAPAROSCOPIC CHOLECYSTECTOMY;  Surgeon: Ralene Ok, MD;  Location: [place];  Service: General;  Laterality: N/A;     Current Outpatient Prescriptions  Medication Sig Dispense Refill  . albuterol (PROVENTIL HFA;VENTOLIN HFA) 108 (90 Base) MCG/ACT inhaler Inhale 2 puffs into the lungs every 6 (six) hours as needed for wheezing. 1 Inhaler 6  . ALPRAZolam (XANAX) 0.25 MG tablet TAKE 3 TABLETS BY MO)UTH EVERY 6 HOURS AS NEEDED AS DIRECTED    . aspirin EC 81 MG tablet Take 1 tablet (81 mg total) by mouth daily. 90 tablet 3  . atorvastatin (LIPITOR) 20 MG tablet TAKE 1 TABLET BY MOUTH EVERY DAY 30 tablet 11  . buPROPion (WELLBUTRIN XL) 150 MG 24 hr tablet  TAKE 1 TABLET (150 MG TOTAL) BY MOUTH DAILY. 90 tablet 3  . cetirizine (ZYRTEC) 10 MG tablet Take 10 mg by mouth daily as needed for allergies.     . flecainide (TAMBOCOR) 50 MG tablet Take 50 mg by mouth 3 (three) times daily.    . hydrochlorothiazide (HYDRODIURIL) 25 MG tablet Take 1 tablet (25 mg total) by mouth daily. 90 tablet 3  . HYDROcodone-acetaminophen (NORCO/VICODIN) 5-325 MG tablet Take 1 tablet by mouth every 6 (six) hours as needed for moderate pain. 120 tablet 0  . ibuprofen (ADVIL,MOTRIN) 200 MG tablet Take 400 mg by mouth every 6 (six) hours as needed for moderate pain.     Marland Kitchen lisinopril (PRINIVIL,ZESTRIL) 20 MG tablet  Take 1 tablet (20 mg total) by mouth daily. 90 tablet 3  . metoprolol (LOPRESSOR) 50 MG tablet TAKE 1 TABLET (50 MG TOTAL) BY MOUTH 2 (TWO) TIMES DAILY. 180 tablet 3  . nitroGLYCERIN (NITROSTAT) 0.4 MG SL tablet Place 0.4 mg under the tongue every 5 (five) minutes as needed for chest pain (MAX 3 TABLETS). Reported on 07/02/2015    . pantoprazole (PROTONIX) 40 MG tablet TAKE 1 TABLET (40 MG TOTAL) BY MOUTH 2 (TWO) TIMES DAILY. 60 tablet 6  . prochlorperazine (COMPAZINE) 10 MG tablet Take 1 tablet (10 mg total) by mouth every 6 (six) hours as needed for nausea or vomiting. 60 tablet 2  . SYMBICORT 160-4.5 MCG/ACT inhaler Inhale 2 puffs into the lungs 2 (two) times daily. Reported on 07/02/2015  6  . predniSONE (DELTASONE) 10 MG tablet Take 4 tabs a day for 4 days, then 3 a day for 4 days, then 2 a day for 4 days, then 1 a day for 4 days, then stop (Patient not taking: Reported on 09/13/2015) 60 tablet 0   No current facility-administered medications for this visit.    Allergies:   Dexamethasone; Augmentin; Clindamycin/lincomycin; and Oxycodone-acetaminophen   Social History:  The patient  reports that she has been smoking Cigarettes.  She has a 24.3 pack-year smoking history. She has never used smokeless tobacco. She reports that she does not drink alcohol or use illicit drugs.   Family History:  The patient's  family history includes CAD in her brother; Emphysema in her mother; Kidney disease in her brother; Other in her father; Throat cancer in her brother. There is no history of Colon cancer, Esophageal cancer, Stomach cancer, or Rectal cancer.    ROS:  Please see the history of present illness.   All other systems are reviewed and negative.    PHYSICAL EXAM: VS:  BP 110/72 mmHg  Pulse 63  Ht 5\' 1"  (1.549 m)  Wt 163 lb 9.6 oz (74.208 kg)  BMI 30.93 kg/m2 , BMI Body mass index is 30.93 kg/(m^2). GEN: Well nourished, well developed, in no acute distress, smells heavily of tobacco HEENT:  normal, poor dentition, Neck: no JVD, carotid bruits, or masses Cardiac: RRR with frequent ectopy; no murmurs, rubs, or gallops,no edema  Respiratory:  clear to auscultation bilaterally, normal work of breathing GI: soft, nontender, nondistended, + BS MS: no deformity or atrophy Skin: warm and dry  Neuro:  Strength and sensation are intact Psych: euthymic mood, full affect  EKG:  EKG is ordered today. The ekg ordered today shows sinus rhythm today.   Recent Labs: 05/14/2015: ALT 14; BUN 18; Creatinine, Ser 0.68; Hemoglobin 15.2*; Platelets 255; Potassium 3.2*; Sodium 133*    Lipid Panel     Component Value Date/Time  CHOL 134 09/08/2014 0958   TRIG 96.0 09/08/2014 0958   HDL 49.70 09/08/2014 0958   CHOLHDL 3 09/08/2014 0958   VLDL 19.2 09/08/2014 0958   LDLCALC 65 09/08/2014 0958   LDLDIRECT 130.9 11/16/2008 1013     Wt Readings from Last 3 Encounters:  09/13/15 163 lb 9.6 oz (74.208 kg)  07/02/15 163 lb (73.936 kg)  06/21/15 156 lb (70.761 kg)      ASSESSMENT AND PLAN:  1.  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  She is quite symptomatic with these. Unfortunately, they have been too infrequent for ablation.  I have offered  propafenone 150mg  TID which she declines.  Ultimately, we may not have any real options for her PVCs.  2. Tobacco Cessation encouraged today  3. HTN Stable No change required today  Current medicines are reviewed at length with the patient today.   The patient does not have concerns regarding her medicines.  The following changes were made today:  none  Follow-up with EP NP in 8 months Follow-up with Dr Angelena Form as scheduled  Signed, Thompson Grayer, MD  09/13/2015 10:29 AM     [place] [address] [phone] (office) [phone] (fax)

## 2015-09-23 ENCOUNTER — Other Ambulatory Visit: Payer: Self-pay | Admitting: Family Medicine

## 2015-10-06 ENCOUNTER — Other Ambulatory Visit: Payer: Self-pay | Admitting: Family Medicine

## 2015-11-02 ENCOUNTER — Encounter: Payer: Self-pay | Admitting: Cardiovascular Disease

## 2015-11-05 ENCOUNTER — Other Ambulatory Visit: Payer: Self-pay | Admitting: Family Medicine

## 2015-11-05 ENCOUNTER — Ambulatory Visit: Payer: Medicare Other | Admitting: Cardiovascular Disease

## 2015-11-15 ENCOUNTER — Other Ambulatory Visit: Payer: Self-pay | Admitting: Family Medicine

## 2015-11-15 MED ORDER — HYDROCODONE-ACETAMINOPHEN 5-325 MG PO TABS: 1.0000 | ORAL_TABLET | Freq: Four times a day (QID) | ORAL | 0 refills | Status: DC | PRN

## 2015-11-15 NOTE — Telephone Encounter (Signed)
Pt need new Rx for Hydrocodone °

## 2015-11-15 NOTE — Telephone Encounter (Signed)
done

## 2015-11-16 NOTE — Telephone Encounter (Signed)
Left voicemail letting patient know rx is ready for pick up at the front.

## 2015-11-17 ENCOUNTER — Other Ambulatory Visit: Payer: Self-pay | Admitting: *Deleted

## 2015-11-17 MED ORDER — FLECAINIDE ACETATE 50 MG PO TABS: 50.0000 mg | ORAL_TABLET | Freq: Three times a day (TID) | ORAL | 2 refills | Status: DC

## 2015-11-17 NOTE — Progress Notes (Addendum)
/   Chief Complaint  Patient presents with  . Premature Ventricular Contractions    follow up     History of Present Illness: 65 yo female with history of HTN, HLD, GERD, OA, depression, ongoing tobacco abuse, COPD, palpitations/PVCs  here today for cardiac follow up. I saw her as a new patient 08/12/13 for cardiovascular examination and risk assessment. She had been on beta blocker for years. She c/o episodes of weakness and felt that her heart rate was slow. She also described dyspnea on exertion. I arranged an echo on 09/03/13 which showed normal LV size and function, possible bicuspid aortic valve, mildly dilated aortic root with possible dissection flap in aorta. CTA chest with no evidence of aortic dissection, thoracic aorta 3.7 cm. Coronary calcification noted. Event monitor showed sinus rhythm with PVCs, bigeminy. Inderal changed to metoprolol. Given ongoing dyspnea and chest pressure, I arranged a right and left heart cath on 04/22/14. She was found to have a moderate mid LAD stenosis (50-60% stenosis). FFR was 0.82-0.85 suggesting this stenosis was not flow limiting. The Circumflex and RCA were free of significant disease. I saw her in March 2016 and she c/o palpitations associated with dizziness and weakness, bradycardia. She was seen by Dr. Rayann Heman and was started on Flecainide. Her PVC burden did improve with this but she has not tolerated high doses of Flecainide. She has tolerated metoprolol.   She is here today for follow up. She reports feeling her heart pounding when she first wakes up and then she feels hot and dizzy. She has been having episodes of chest pain at rest and with exertion. Severe chest pain 2 weeks ago. No change in breathing. She has baseline dyspnea with COPD and ongoing tobacco abuse.   Primary Care Physician: [name], MD   Past Medical History:  Diagnosis Date  . Allergic rhinitis   . Allergy    seasonal  . Anxiety   . Cataract   . Complication of  anesthesia 2003   Medication for block "went up to my brain and I stopped breathing"  . COPD (chronic obstructive pulmonary disease) ([place])   . Depression   . Dexamethasone adverse reaction    2002 normal  . GERD (gastroesophageal reflux disease)   . Hyperlipidemia   . Hypertension   . Insomnia   . Osteoarthritis   . Overactive bladder   . Premature ventricular contractions    LBBB inferior axis PVCs  . Tobacco abuse   . Ulcer    duodenal    Past Surgical History:  Procedure Laterality Date  . CARPAL TUNNEL RELEASE Left    1997 left,   . CHOLECYSTECTOMY N/A 03/23/2015   Procedure: LAPAROSCOPIC CHOLECYSTECTOMY;  Surgeon: Ralene Ok, MD;  Location: [facility];  Service: General;  Laterality: N/A;  . COLONOSCOPY  10-17-13   per Dr. Olevia Perches, benign polyps, repeat in 10 years  . EUS N/A 11/12/2014   Procedure: UPPER ENDOSCOPIC ULTRASOUND (EUS) LINEAR;  Surgeon: Milus Banister, MD;  Location: [facility] ENDOSCOPY;  Service: Endoscopy;  Laterality: N/A;  . LEFT AND RIGHT HEART CATHETERIZATION WITH CORONARY ANGIOGRAM N/A 04/22/2014   Procedure: LEFT AND RIGHT HEART CATHETERIZATION WITH CORONARY ANGIOGRAM;  Surgeon: Burnell Blanks, MD;  Location: [facility] CATH LAB;  Service: Cardiovascular;  Laterality: N/A;  . TONSILLECTOMY    . torn cartilage repaired rt wrist Right 2003    Current Outpatient Prescriptions  Medication Sig Dispense Refill  . albuterol (PROVENTIL HFA;VENTOLIN HFA) 108 (90 Base) MCG/ACT inhaler Inhale  2 puffs into the lungs every 6 (six) hours as needed for wheezing. 1 Inhaler 6  . ALPRAZolam (XANAX) 0.25 MG tablet Take 0.75 mg by mouth 3 (three) times daily. TAKE 3 TABLETS BY MO)UTH EVERY 6 HOURS AS NEEDED FOR ANXIETY    . aspirin EC 81 MG tablet Take 1 tablet (81 mg total) by mouth daily. 90 tablet 3  . atorvastatin (LIPITOR) 20 MG tablet TAKE 1 TABLET BY MOUTH EVERY DAY 30 tablet 0  . buPROPion (WELLBUTRIN XL) 150 MG 24 hr tablet TAKE 1 TABLET (150 MG TOTAL) BY MOUTH DAILY.  90 tablet 3  . cetirizine (ZYRTEC) 10 MG tablet Take 10 mg by mouth daily as needed for allergies.     . flecainide (TAMBOCOR) 50 MG tablet Take 1 tablet (50 mg total) by mouth 3 (three) times daily. 270 tablet 2  . hydrochlorothiazide (HYDRODIURIL) 25 MG tablet TAKE 1 TABLET BY MOUTH EVERY DAY 90 tablet 0  . HYDROcodone-acetaminophen (NORCO/VICODIN) 5-325 MG tablet Take 1 tablet by mouth every 6 (six) hours as needed for moderate pain. 120 tablet 0  . ibuprofen (ADVIL,MOTRIN) 200 MG tablet Take 400 mg by mouth every 6 (six) hours as needed for moderate pain.     Marland Kitchen lisinopril (PRINIVIL,ZESTRIL) 20 MG tablet Take 1 tablet (20 mg total) by mouth daily. 90 tablet 3  . metoprolol (LOPRESSOR) 50 MG tablet TAKE 1 TABLET (50 MG TOTAL) BY MOUTH 2 (TWO) TIMES DAILY. 180 tablet 3  . nitroGLYCERIN (NITROSTAT) 0.4 MG SL tablet Place 0.4 mg under the tongue every 5 (five) minutes as needed for chest pain (MAX 3 TABLETS). Reported on 07/02/2015    . pantoprazole (PROTONIX) 40 MG tablet TAKE 1 TABLET (40 MG TOTAL) BY MOUTH 2 (TWO) TIMES DAILY. 60 tablet 6  . prochlorperazine (COMPAZINE) 10 MG tablet Take 1 tablet (10 mg total) by mouth every 6 (six) hours as needed for nausea or vomiting. 60 tablet 2  . SYMBICORT 160-4.5 MCG/ACT inhaler Inhale 2 puffs into the lungs 2 (two) times daily. Reported on 07/02/2015  6   No current facility-administered medications for this visit.     Allergies  Allergen Reactions  . Dexamethasone Other (See Comments)    Pt thinks they used it during surgery and she quit breathing, not sure this was the exact medication.  . Augmentin [Amoxicillin-Pot Clavulanate] Nausea And Vomiting  . Clindamycin/Lincomycin Nausea And Vomiting  . Oxycodone-Acetaminophen Nausea And Vomiting    Social History   Social History  . Marital status: Single    Spouse name: N/A  . Number of children: 0  . Years of education: N/A   Occupational History  . CNA    Social History Main Topics  .  Smoking status: Current Every Day Smoker    Packs/day: 0.45    Years: 54.00    Types: Cigarettes  . Smokeless tobacco: Never Used     Comment: 1/2 pack per day or less  . Alcohol use No  . Drug use: No  . Sexual activity: Not on file   Other Topics Concern  . Not on file   Social History Narrative   Works as Quarry manager at [employer].    Family History  Problem Relation Age of Onset  . CAD Brother   . Kidney disease Brother   . Emphysema Mother   . Other Father     brain tumor  . Throat cancer Brother   . Hyperlipidemia    . Hypertension    .  Kidney disease    . Heart disease    . Colon cancer Neg Hx   . Esophageal cancer Neg Hx   . Stomach cancer Neg Hx   . Rectal cancer Neg Hx     Review of Systems:  As stated in the HPI and otherwise negative.   BP 110/70   Pulse 72   Ht 5\' 2"  (1.575 m)   Wt 164 lb 9.6 oz (74.7 kg)   SpO2 94%   BMI 30.11 kg/m   Physical Examination: General: Well developed, well nourished, NAD  HEENT: OP clear, mucus membranes moist  SKIN: warm, dry. No rashes. Neuro: No focal deficits  Musculoskeletal: Muscle strength 5/5 all ext  Psychiatric: Mood and affect normal  Neck: No JVD, no carotid bruits, no thyromegaly, no lymphadenopathy.  Lungs:Clear bilaterally, no wheezes, rhonci, crackles Cardiovascular: Regular rate and rhythm. No murmurs, gallops or rubs. Abdomen:Soft. Bowel sounds present. Non-tender.  Extremities: No lower extremity edema. Pulses are 2 + in the bilateral DP/PT.  Echo 09/03/13: Left ventricle: The cavity size was normal. Systolic function was normal. The estimated ejection fraction was in the range of 60% to 65%. Wall motion was normal; there were no regional wall motion abnormalities. - Aortic valve: Possibly bicuspid; normal thickness leaflets. - Aorta: Dilated aortic root and ascending aorta measuring 40 mm. - Mitral valve: Structurally normal valve. There was no regurgitation. - Left atrium: The atrium was  normal in size. - Right ventricle: The cavity size was normal. Wall thickness was normal. - Right atrium: The atrium was normal in size. - Tricuspid valve: Structurally normal valve. There was no regurgitation. - Pulmonic valve: Structurally normal valve. - Inferior vena cava: The vessel was normal in size. - Pericardium, extracardiac: There was no pericardial effusion. Impressions: - Dilated aortic root and ascending aorta measuring 40 mm. There is possible dissection flap in the ascending aorta that needs to be further evaluated by either TEE or CTA.  Chest CTA 09/29/13:  1. Prominent ascending aorta measuring 3.7 cm. 2. Thoracic aorta atherosclerosis without dissection. 3. Age advanced coronary artery atherosclerosis. Recommend assessment of coronary risk factors and consideration of medical therapy. 4. Faint airspace disease especially involving the upper lobes, consistent with alveolitis. Correlation is recommended with pulmonary symptoms. 5. Small right thyroid nodule. No further evaluation is felt to be necessary. This follows ACR consensus guidelines: Managing Incidental Thyroid Nodules Detected on Imaging: White Paper of the ACR Incidental Thyroid Findings Committee. J Am Coll Radiol 2015; 12:143-150. 6. Possible adrenal hyperplasia.  Cardiac cath 04/22/14: Hemodynamic Findings: Ao: 134/74  LV: 132/8/18 RA: 1  RV: 38/4/8 PA: 33/12 (mean 21) PCWP: 4 Fick Cardiac Output: 3.8 L/min Fick Cardiac Index: 2.19 L/min/m2 Central Aortic Saturation: 99% Pulmonary Artery Saturation: 69%  Angiographic Findings: Left main: No obstructive disease Left Anterior Descending Artery: Large caliber vessel that courses to the apex. The proximal vessel has diffuse 20% stenosis. The mid vessel has a 50-60% stenosis. There is a small septal branch that arises from this plaque with ostial 90% stenosis. Several small caliber diagonal branches.  Circumflex Artery:  Large caliber vessel with large caliber first obtuse marginal branch. No obstructive disease.  Right Coronary Artery: Large dominant vessel with proximal and mid calcification, diffuse 20% stenosis in the proximal and mid vessel.  Left Ventricular Angiogram: LVEF=55-60%.  Impression: 1. Moderate non-obstructive CAD 2. Moderate mid LAD stenosis with FFR borderline at 0.82.  3. Normal LV systolic function  EKG:  EKG is not  ordered today.  The ekg ordered today demonstrates   Recent Labs: 05/14/2015: ALT 14; BUN 18; Creatinine, Ser 0.68; Hemoglobin 15.2; Platelets 255; Potassium 3.2; Sodium 133   Lipid Panel    Component Value Date/Time   CHOL 134 09/08/2014 0958   TRIG 96.0 09/08/2014 0958   HDL 49.70 09/08/2014 0958   CHOLHDL 3 09/08/2014 0958   VLDL 19.2 09/08/2014 0958   LDLCALC 65 09/08/2014 0958   LDLDIRECT 130.9 11/16/2008 1013     Wt Readings from Last 3 Encounters:  11/18/15 164 lb 9.6 oz (74.7 kg)  09/13/15 163 lb 9.6 oz (74.2 kg)  07/02/15 163 lb (73.9 kg)     Other studies Reviewed: Additional studies/ records that were reviewed today include: . Review of the above records demonstrates:    Assessment and Plan:   1. PVCs: She is on Flecainide and metoprolol.  2. CAD with unstable angina: Moderate LAD stenosis by cath 04/22/14. FFR of the LAD showed that this lesion is not flow limiting. She is now having chest pain c/w unstable angina. Will plan cardiac cath 12/03/15. Risks and benefits reviewed. She agrees to proceed. Pre-cath labs next week. Will continue ASA, beta blocker and statin. LDL at goal.   3. Tobacco abuse: Smoking cessation is encouraged. She does not wish to stop. Counseling given on cessation.   4. Bicuspid aortic valve: No aortic stenosis at this time. No murmur. Will follow.   Current medicines are reviewed at length with the patient today.  The patient does not have concerns regarding medicines.  The following changes have been made:    Labs/  tests ordered today include:   Orders Placed This Encounter  Procedures  . Basic Metabolic Panel (BMET)  . CBC w/Diff  . INR/PT    Disposition:   FU with me in 6 months  Signed, Lauree Chandler, MD 11/18/2015 1:15 PM    [facility] [place], [place], [place]  [address] Phone: [phone]; Fax: [phone]

## 2015-11-18 ENCOUNTER — Encounter: Payer: Self-pay | Admitting: *Deleted

## 2015-11-18 ENCOUNTER — Ambulatory Visit (INDEPENDENT_AMBULATORY_CARE_PROVIDER_SITE_OTHER): Payer: Medicare Other | Admitting: Cardiovascular Disease

## 2015-11-18 ENCOUNTER — Encounter (INDEPENDENT_AMBULATORY_CARE_PROVIDER_SITE_OTHER): Payer: Self-pay

## 2015-11-18 VITALS — BP 110/70 | HR 72 | Ht 62.0 in | Wt 164.6 lb

## 2015-11-18 DIAGNOSIS — Z720 Tobacco use: Secondary | ICD-10-CM | POA: Diagnosis not present

## 2015-11-18 DIAGNOSIS — I25110 Atherosclerotic heart disease of native coronary artery with unstable angina pectoris: Secondary | ICD-10-CM

## 2015-11-18 DIAGNOSIS — I493 Ventricular premature depolarization: Secondary | ICD-10-CM | POA: Diagnosis not present

## 2015-11-18 NOTE — Patient Instructions (Signed)
Medication Instructions:  Your physician recommends that you continue on your current medications as directed. Please refer to the Current Medication list given to you today.   Labwork: 11/25/15 BMET, CBC W/DIFF, PT/INR  Testing/Procedures: Your physician has requested that you have a cardiac catheterization. Cardiac catheterization is used to diagnose and/or treat various heart conditions. Doctors may recommend this procedure for a number of different reasons. The most common reason is to evaluate chest pain. Chest pain can be a symptom of coronary artery disease (CAD), and cardiac catheterization can show whether plaque is narrowing or blocking your heart's arteries. This procedure is also used to evaluate the valves, as well as measure the blood flow and oxygen levels in different parts of your heart. For further information please visit [email or web address]. Please follow instruction sheet, as given.    Follow-Up: YOU WILL NEED A POST CATH FOLLOW UP 2 AFTER CATH WITH DR. Angelena Form  Any Other Special Instructions Will Be Listed Below (If Applicable).     If you need a refill on your cardiac medications before your next appointment, please call your pharmacy.

## 2015-11-25 ENCOUNTER — Other Ambulatory Visit: Payer: Medicare Other | Admitting: *Deleted

## 2015-11-25 DIAGNOSIS — I25110 Atherosclerotic heart disease of native coronary artery with unstable angina pectoris: Secondary | ICD-10-CM | POA: Diagnosis not present

## 2015-11-25 DIAGNOSIS — I493 Ventricular premature depolarization: Secondary | ICD-10-CM

## 2015-11-25 LAB — BASIC METABOLIC PANEL
BUN: 15 mg/dL (ref 7–25)
CHLORIDE: 96 mmol/L — AB (ref 98–110)
CO2: 28 mmol/L (ref 20–31)
Calcium: 8.9 mg/dL (ref 8.6–10.4)
Creat: 1.12 mg/dL — ABNORMAL HIGH (ref 0.50–0.99)
Glucose, Bld: 90 mg/dL (ref 65–99)
POTASSIUM: 3.9 mmol/L (ref 3.5–5.3)
Sodium: 132 mmol/L — ABNORMAL LOW (ref 135–146)

## 2015-11-25 LAB — CBC WITH DIFFERENTIAL/PLATELET
BASOS ABS: 81 {cells}/uL (ref 0–200)
BASOS PCT: 1 %
EOS ABS: 243 {cells}/uL (ref 15–500)
Eosinophils Relative: 3 %
HEMATOCRIT: 44.6 % (ref 35.0–45.0)
Hemoglobin: 14.9 g/dL (ref 11.7–15.5)
LYMPHS PCT: 55 %
Lymphs Abs: 4455 cells/uL — ABNORMAL HIGH (ref 850–3900)
MCH: 31.1 pg (ref 27.0–33.0)
MCHC: 33.4 g/dL (ref 32.0–36.0)
MCV: 93.1 fL (ref 80.0–100.0)
MONO ABS: 567 {cells}/uL (ref 200–950)
MPV: 9.2 fL (ref 7.5–12.5)
Monocytes Relative: 7 %
Neutro Abs: 2754 cells/uL (ref 1500–7800)
Neutrophils Relative %: 34 %
PLATELETS: 299 10*3/uL (ref 140–400)
RBC: 4.79 MIL/uL (ref 3.80–5.10)
RDW: 13.7 % (ref 11.0–15.0)
WBC: 8.1 10*3/uL (ref 3.8–10.8)

## 2015-11-25 LAB — PROTIME-INR
INR: 1
PROTHROMBIN TIME: 10.7 s (ref 9.0–11.5)

## 2015-11-29 ENCOUNTER — Encounter: Payer: Self-pay | Admitting: Cardiology

## 2015-12-03 ENCOUNTER — Encounter (HOSPITAL_COMMUNITY): Payer: Self-pay | Admitting: *Deleted

## 2015-12-03 ENCOUNTER — Ambulatory Visit (HOSPITAL_COMMUNITY)
Admission: RE | Admit: 2015-12-03 | Discharge: 2015-12-03 | Disposition: A | Discharge status: Home / Self Care | Payer: Medicare Other | Source: Ambulatory Visit | Attending: Cardiovascular Disease | Admitting: Cardiovascular Disease

## 2015-12-03 ENCOUNTER — Encounter (HOSPITAL_COMMUNITY)
Admission: RE | Disposition: A | Discharge status: Home / Self Care | Payer: Self-pay | Source: Ambulatory Visit | Attending: Cardiovascular Disease

## 2015-12-03 DIAGNOSIS — G4700 Insomnia, unspecified: Secondary | ICD-10-CM | POA: Diagnosis not present

## 2015-12-03 DIAGNOSIS — Z7951 Long term (current) use of inhaled steroids: Secondary | ICD-10-CM | POA: Insufficient documentation

## 2015-12-03 DIAGNOSIS — I2584 Coronary atherosclerosis due to calcified coronary lesion: Secondary | ICD-10-CM | POA: Diagnosis not present

## 2015-12-03 DIAGNOSIS — I493 Ventricular premature depolarization: Secondary | ICD-10-CM | POA: Diagnosis not present

## 2015-12-03 DIAGNOSIS — F329 Major depressive disorder, single episode, unspecified: Secondary | ICD-10-CM | POA: Diagnosis not present

## 2015-12-03 DIAGNOSIS — N3281 Overactive bladder: Secondary | ICD-10-CM | POA: Insufficient documentation

## 2015-12-03 DIAGNOSIS — J449 Chronic obstructive pulmonary disease, unspecified: Secondary | ICD-10-CM | POA: Insufficient documentation

## 2015-12-03 DIAGNOSIS — Z7982 Long term (current) use of aspirin: Secondary | ICD-10-CM | POA: Insufficient documentation

## 2015-12-03 DIAGNOSIS — F17210 Nicotine dependence, cigarettes, uncomplicated: Secondary | ICD-10-CM | POA: Insufficient documentation

## 2015-12-03 DIAGNOSIS — M1990 Unspecified osteoarthritis, unspecified site: Secondary | ICD-10-CM | POA: Diagnosis not present

## 2015-12-03 DIAGNOSIS — I25110 Atherosclerotic heart disease of native coronary artery with unstable angina pectoris: Secondary | ICD-10-CM | POA: Diagnosis not present

## 2015-12-03 DIAGNOSIS — F419 Anxiety disorder, unspecified: Secondary | ICD-10-CM | POA: Diagnosis not present

## 2015-12-03 DIAGNOSIS — K219 Gastro-esophageal reflux disease without esophagitis: Secondary | ICD-10-CM | POA: Insufficient documentation

## 2015-12-03 DIAGNOSIS — I25119 Atherosclerotic heart disease of native coronary artery with unspecified angina pectoris: Secondary | ICD-10-CM

## 2015-12-03 DIAGNOSIS — I700 Atherosclerosis of aorta: Secondary | ICD-10-CM | POA: Insufficient documentation

## 2015-12-03 DIAGNOSIS — Z8249 Family history of ischemic heart disease and other diseases of the circulatory system: Secondary | ICD-10-CM | POA: Insufficient documentation

## 2015-12-03 DIAGNOSIS — I10 Essential (primary) hypertension: Secondary | ICD-10-CM | POA: Insufficient documentation

## 2015-12-03 DIAGNOSIS — I447 Left bundle-branch block, unspecified: Secondary | ICD-10-CM | POA: Diagnosis not present

## 2015-12-03 DIAGNOSIS — J309 Allergic rhinitis, unspecified: Secondary | ICD-10-CM | POA: Insufficient documentation

## 2015-12-03 DIAGNOSIS — E785 Hyperlipidemia, unspecified: Secondary | ICD-10-CM | POA: Insufficient documentation

## 2015-12-03 DIAGNOSIS — E042 Nontoxic multinodular goiter: Secondary | ICD-10-CM | POA: Diagnosis not present

## 2015-12-03 HISTORY — PX: CARDIAC CATHETERIZATION: SHX172

## 2015-12-03 SURGERY — LEFT HEART CATH AND CORONARY ANGIOGRAPHY

## 2015-12-03 MED ORDER — LIDOCAINE HCL (PF) 1 % IJ SOLN
INTRAMUSCULAR | Status: AC
  Filled 2015-12-03: qty 30

## 2015-12-03 MED ORDER — FENTANYL CITRATE (PF) 100 MCG/2ML IJ SOLN
INTRAMUSCULAR | Status: AC
  Filled 2015-12-03: qty 2

## 2015-12-03 MED ORDER — HEPARIN (PORCINE) IN NACL 2-0.9 UNIT/ML-% IJ SOLN
INTRAMUSCULAR | Status: DC | PRN
  Administered 2015-12-03: 10 mL via INTRA_ARTERIAL

## 2015-12-03 MED ORDER — IOPAMIDOL (ISOVUE-370) INJECTION 76%
INTRAVENOUS | Status: AC
  Filled 2015-12-03: qty 100

## 2015-12-03 MED ORDER — SODIUM CHLORIDE 0.9 % IV SOLN
INTRAVENOUS | Status: AC
  Administered 2015-12-03: 11:00:00 via INTRAVENOUS

## 2015-12-03 MED ORDER — MIDAZOLAM HCL 2 MG/2ML IJ SOLN
INTRAMUSCULAR | Status: DC | PRN
  Administered 2015-12-03: 1 mg via INTRAVENOUS
  Administered 2015-12-03: 2 mg via INTRAVENOUS

## 2015-12-03 MED ORDER — SODIUM CHLORIDE 0.9 % IV SOLN: INTRAVENOUS | Status: AC

## 2015-12-03 MED ORDER — LIDOCAINE HCL (PF) 1 % IJ SOLN
INTRAMUSCULAR | Status: DC | PRN
  Administered 2015-12-03: 2 mL
  Administered 2015-12-03: 3 mL

## 2015-12-03 MED ORDER — SODIUM CHLORIDE 0.9% FLUSH: 3.0000 mL | Freq: Two times a day (BID) | INTRAVENOUS | Status: DC

## 2015-12-03 MED ORDER — FENTANYL CITRATE (PF) 100 MCG/2ML IJ SOLN
INTRAMUSCULAR | Status: DC | PRN
  Administered 2015-12-03 (×2): 25 ug via INTRAVENOUS

## 2015-12-03 MED ORDER — HEPARIN (PORCINE) IN NACL 2-0.9 UNIT/ML-% IJ SOLN
INTRAMUSCULAR | Status: AC
  Filled 2015-12-03: qty 1000

## 2015-12-03 MED ORDER — MIDAZOLAM HCL 2 MG/2ML IJ SOLN
INTRAMUSCULAR | Status: AC
  Filled 2015-12-03: qty 2

## 2015-12-03 MED ORDER — SODIUM CHLORIDE 0.9% FLUSH: 3.0000 mL | INTRAVENOUS | Status: DC | PRN

## 2015-12-03 MED ORDER — IOPAMIDOL (ISOVUE-370) INJECTION 76%
INTRAVENOUS | Status: DC | PRN
  Administered 2015-12-03: 80 mL via INTRA_ARTERIAL

## 2015-12-03 MED ORDER — VERAPAMIL HCL 2.5 MG/ML IV SOLN
INTRAVENOUS | Status: AC
Start: 2015-12-03 — End: 2015-12-03
  Filled 2015-12-03: qty 2

## 2015-12-03 MED ORDER — HEPARIN (PORCINE) IN NACL 2-0.9 UNIT/ML-% IJ SOLN
INTRAMUSCULAR | Status: DC | PRN
  Administered 2015-12-03: 1000 mL

## 2015-12-03 MED ORDER — SODIUM CHLORIDE 0.9 % IV SOLN: 250.0000 mL | INTRAVENOUS | Status: DC | PRN

## 2015-12-03 MED ORDER — HEPARIN SODIUM (PORCINE) 1000 UNIT/ML IJ SOLN
INTRAMUSCULAR | Status: AC
  Filled 2015-12-03: qty 1

## 2015-12-03 MED ORDER — ASPIRIN 81 MG PO CHEW: 81.0000 mg | CHEWABLE_TABLET | ORAL | Status: DC

## 2015-12-03 MED ORDER — HEPARIN SODIUM (PORCINE) 1000 UNIT/ML IJ SOLN
INTRAMUSCULAR | Status: DC | PRN
  Administered 2015-12-03: 4000 [IU] via INTRAVENOUS

## 2015-12-03 SURGICAL SUPPLY — 14 items
CATH INFINITI 5 FR JL3.5 (CATHETERS) ×3 IMPLANT
CATH INFINITI 5FR ANG PIGTAIL (CATHETERS) ×3 IMPLANT
CATH INFINITI JR4 5F (CATHETERS) ×3 IMPLANT
COVER PRB 48X5XTLSCP FOLD TPE (BAG) ×1 IMPLANT
COVER PROBE 5X48 (BAG) ×2
DEVICE RAD COMP TR BAND LRG (VASCULAR PRODUCTS) ×3 IMPLANT
GLIDESHEATH SLEND SS 6F .021 (SHEATH) ×3 IMPLANT
KIT HEART LEFT (KITS) ×3 IMPLANT
PACK CARDIAC CATHETERIZATION (CUSTOM PROCEDURE TRAY) ×3 IMPLANT
SYR MEDRAD MARK V 150ML (SYRINGE) ×3 IMPLANT
TRANSDUCER W/STOPCOCK (MISCELLANEOUS) ×3 IMPLANT
TUBING CIL FLEX 10 FLL-RA (TUBING) ×3 IMPLANT
WIRE HI TORQ VERSACORE-J 145CM (WIRE) ×3 IMPLANT
WIRE SAFE-T 1.5MM-J .035X260CM (WIRE) ×3 IMPLANT

## 2015-12-03 NOTE — Discharge Instructions (Signed)
Radial Site Care °Refer to this sheet in the next few weeks. These instructions provide you with information about caring for yourself after your procedure. Your health care provider may also give you more specific instructions. Your treatment has been planned according to current medical practices, but problems sometimes occur. Call your health care provider if you have any problems or questions after your procedure. °WHAT TO EXPECT AFTER THE PROCEDURE °After your procedure, it is typical to have the following: °· Bruising at the radial site that usually fades within 1-2 weeks. °· Blood collecting in the tissue (hematoma) that may be painful to the touch. It should usually decrease in size and tenderness within 1-2 weeks. °HOME CARE INSTRUCTIONS °· Take medicines only as directed by your health care provider. °· You may shower 24-48 hours after the procedure or as directed by your health care provider. Remove the bandage (dressing) and gently wash the site with plain soap and water. Pat the area dry with a clean towel. Do not rub the site, because this may cause bleeding. °· Do not take baths, swim, or use a hot tub until your health care provider approves. °· Check your insertion site every day for redness, swelling, or drainage. °· Do not apply powder or lotion to the site. °· Do not flex or bend the affected arm for 24 hours or as directed by your health care provider. °· Do not push or pull heavy objects with the affected arm for 24 hours or as directed by your health care provider. °· Do not lift over 10 lb (4.5 kg) for 5 days after your procedure or as directed by your health care provider. °· Ask your health care provider when it is okay to: °¨ Return to work or school. °¨ Resume usual physical activities or sports. °¨ Resume sexual activity. °· Do not drive home if you are discharged the same day as the procedure. Have someone else drive you. °· You may drive 24 hours after the procedure unless otherwise  instructed by your health care provider. °· Do not operate machinery or power tools for 24 hours after the procedure. °· If your procedure was done as an outpatient procedure, which means that you went home the same day as your procedure, a responsible adult should be with you for the first 24 hours after you arrive home. °· Keep all follow-up visits as directed by your health care provider. This is important. °SEEK MEDICAL CARE IF: °· You have a fever. °· You have chills. °· You have increased bleeding from the radial site. Hold pressure on the site. °SEEK IMMEDIATE MEDICAL CARE IF: °· You have unusual pain at the radial site. °· You have redness, warmth, or swelling at the radial site. °· You have drainage (other than a small amount of blood on the dressing) from the radial site. °· The radial site is bleeding, and the bleeding does not stop after 30 minutes of holding steady pressure on the site. °· Your arm or hand becomes pale, cool, tingly, or numb. °  °This information is not intended to replace advice given to you by your health care provider. Make sure you discuss any questions you have with your health care provider. °  °Document Released: 04/22/2010 Document Revised: 04/10/2014 Document Reviewed: 10/06/2013 °Elsevier Interactive Patient Education ©2016 [employer] ° °

## 2015-12-03 NOTE — Interval H&P Note (Signed)
History and Physical Interval Note:  12/03/2015 11:00 AM  [name] [name]  has presented today for cardiac cath with the diagnosis of unstable angina  The various methods of treatment have been discussed with the patient and family. After consideration of risks, benefits and other options for treatment, the patient has consented to  Procedure(s): Left Heart Cath and Coronary Angiography (N/A) as a surgical intervention .  The patient's history has been reviewed, patient examined, no change in status, stable for surgery.  I have reviewed the patient's chart and labs.  Questions were answered to the patient's satisfaction.    Cath Lab Visit (complete for each Cath Lab visit)  Clinical Evaluation Leading to the Procedure:   ACS: No.  Non-ACS:    Anginal Classification: CCS III  Anti-ischemic medical therapy: Minimal Therapy (1 class of medications)  Non-Invasive Test Results: No non-invasive testing performed  Prior CABG: No previous CABG         Lauree Chandler

## 2015-12-03 NOTE — H&P (View-Only) (Signed)
/   Chief Complaint  Patient presents with  . Premature Ventricular Contractions    follow up     History of Present Illness: 65 yo female with history of HTN, HLD, GERD, OA, depression, ongoing tobacco abuse, COPD, palpitations/PVCs  here today for cardiac follow up. I saw her as a new patient 08/12/13 for cardiovascular examination and risk assessment. She had been on beta blocker for years. She c/o episodes of weakness and felt that her heart rate was slow. She also described dyspnea on exertion. I arranged an echo on 09/03/13 which showed normal LV size and function, possible bicuspid aortic valve, mildly dilated aortic root with possible dissection flap in aorta. CTA chest with no evidence of aortic dissection, thoracic aorta 3.7 cm. Coronary calcification noted. Event monitor showed sinus rhythm with PVCs, bigeminy. Inderal changed to metoprolol. Given ongoing dyspnea and chest pressure, I arranged a right and left heart cath on 04/22/14. She was found to have a moderate mid LAD stenosis (50-60% stenosis). FFR was 0.82-0.85 suggesting this stenosis was not flow limiting. The Circumflex and RCA were free of significant disease. I saw her in March 2016 and she c/o palpitations associated with dizziness and weakness, bradycardia. She was seen by Dr. Rayann Heman and was started on Flecainide. Her PVC burden did improve with this but she has not tolerated high doses of Flecainide. She has tolerated metoprolol.   She is here today for follow up. She reports feeling her heart pounding when she first wakes up and then she feels hot and dizzy. She has been having episodes of chest pain at rest and with exertion. Severe chest pain 2 weeks ago. No change in breathing. She has baseline dyspnea with COPD and ongoing tobacco abuse.   Primary Care Physician: [name], MD   Past Medical History:  Diagnosis Date  . Allergic rhinitis   . Allergy    seasonal  . Anxiety   . Cataract   . Complication of  anesthesia 2003   Medication for block "went up to my brain and I stopped breathing"  . COPD (chronic obstructive pulmonary disease) ([place])   . Depression   . Dexamethasone adverse reaction    2002 normal  . GERD (gastroesophageal reflux disease)   . Hyperlipidemia   . Hypertension   . Insomnia   . Osteoarthritis   . Overactive bladder   . Premature ventricular contractions    LBBB inferior axis PVCs  . Tobacco abuse   . Ulcer    duodenal    Past Surgical History:  Procedure Laterality Date  . CARPAL TUNNEL RELEASE Left    1997 left,   . CHOLECYSTECTOMY N/A 03/23/2015   Procedure: LAPAROSCOPIC CHOLECYSTECTOMY;  Surgeon: Ralene Ok, MD;  Location: [facility];  Service: General;  Laterality: N/A;  . COLONOSCOPY  10-17-13   per Dr. Olevia Perches, benign polyps, repeat in 10 years  . EUS N/A 11/12/2014   Procedure: UPPER ENDOSCOPIC ULTRASOUND (EUS) LINEAR;  Surgeon: Milus Banister, MD;  Location: [facility];  Service: Endoscopy;  Laterality: N/A;  . LEFT AND RIGHT HEART CATHETERIZATION WITH CORONARY ANGIOGRAM N/A 04/22/2014   Procedure: LEFT AND RIGHT HEART CATHETERIZATION WITH CORONARY ANGIOGRAM;  Surgeon: Burnell Blanks, MD;  Location: [facility] CATH [facility];  Service: Cardiovascular;  Laterality: N/A;  . TONSILLECTOMY    . torn cartilage repaired rt wrist Right 2003    Current Outpatient Prescriptions  Medication Sig Dispense Refill  . albuterol (PROVENTIL HFA;VENTOLIN HFA) 108 (90 Base) MCG/ACT inhaler Inhale  2 puffs into the lungs every 6 (six) hours as needed for wheezing. 1 Inhaler 6  . ALPRAZolam (XANAX) 0.25 MG tablet Take 0.75 mg by mouth 3 (three) times daily. TAKE 3 TABLETS BY MO)UTH EVERY 6 HOURS AS NEEDED FOR ANXIETY    . aspirin EC 81 MG tablet Take 1 tablet (81 mg total) by mouth daily. 90 tablet 3  . atorvastatin (LIPITOR) 20 MG tablet TAKE 1 TABLET BY MOUTH EVERY DAY 30 tablet 0  . buPROPion (WELLBUTRIN XL) 150 MG 24 hr tablet TAKE 1 TABLET (150 MG TOTAL) BY MOUTH DAILY.  90 tablet 3  . cetirizine (ZYRTEC) 10 MG tablet Take 10 mg by mouth daily as needed for allergies.     . flecainide (TAMBOCOR) 50 MG tablet Take 1 tablet (50 mg total) by mouth 3 (three) times daily. 270 tablet 2  . hydrochlorothiazide (HYDRODIURIL) 25 MG tablet TAKE 1 TABLET BY MOUTH EVERY DAY 90 tablet 0  . HYDROcodone-acetaminophen (NORCO/VICODIN) 5-325 MG tablet Take 1 tablet by mouth every 6 (six) hours as needed for moderate pain. 120 tablet 0  . ibuprofen (ADVIL,MOTRIN) 200 MG tablet Take 400 mg by mouth every 6 (six) hours as needed for moderate pain.     Marland Kitchen lisinopril (PRINIVIL,ZESTRIL) 20 MG tablet Take 1 tablet (20 mg total) by mouth daily. 90 tablet 3  . metoprolol (LOPRESSOR) 50 MG tablet TAKE 1 TABLET (50 MG TOTAL) BY MOUTH 2 (TWO) TIMES DAILY. 180 tablet 3  . nitroGLYCERIN (NITROSTAT) 0.4 MG SL tablet Place 0.4 mg under the tongue every 5 (five) minutes as needed for chest pain (MAX 3 TABLETS). Reported on 07/02/2015    . pantoprazole (PROTONIX) 40 MG tablet TAKE 1 TABLET (40 MG TOTAL) BY MOUTH 2 (TWO) TIMES DAILY. 60 tablet 6  . prochlorperazine (COMPAZINE) 10 MG tablet Take 1 tablet (10 mg total) by mouth every 6 (six) hours as needed for nausea or vomiting. 60 tablet 2  . SYMBICORT 160-4.5 MCG/ACT inhaler Inhale 2 puffs into the lungs 2 (two) times daily. Reported on 07/02/2015  6   No current facility-administered medications for this visit.     Allergies  Allergen Reactions  . Dexamethasone Other (See Comments)    Pt thinks they used it during surgery and she quit breathing, not sure this was the exact medication.  . Augmentin [Amoxicillin-Pot Clavulanate] Nausea And Vomiting  . Clindamycin/Lincomycin Nausea And Vomiting  . Oxycodone-Acetaminophen Nausea And Vomiting    Social History   Social History  . Marital status: Single    Spouse name: N/A  . Number of children: 0  . Years of education: N/A   Occupational History  . CNA    Social History Main Topics  .  Smoking status: Current Every Day Smoker    Packs/day: 0.45    Years: 54.00    Types: Cigarettes  . Smokeless tobacco: Never Used     Comment: 1/2 pack per day or less  . Alcohol use No  . Drug use: No  . Sexual activity: Not on file   Other Topics Concern  . Not on file   Social History Narrative   Works as Quarry manager at [employer].    Family History  Problem Relation Age of Onset  . CAD Brother   . Kidney disease Brother   . Emphysema Mother   . Other Father     brain tumor  . Throat cancer Brother   . Hyperlipidemia    . Hypertension    .  Kidney disease    . Heart disease    . Colon cancer Neg Hx   . Esophageal cancer Neg Hx   . Stomach cancer Neg Hx   . Rectal cancer Neg Hx     Review of Systems:  As stated in the HPI and otherwise negative.   BP 110/70   Pulse 72   Ht 5\' 2"  (1.575 m)   Wt 164 lb 9.6 oz (74.7 kg)   SpO2 94%   BMI 30.11 kg/m   Physical Examination: General: Well developed, well nourished, NAD  HEENT: OP clear, mucus membranes moist  SKIN: warm, dry. No rashes. Neuro: No focal deficits  Musculoskeletal: Muscle strength 5/5 all ext  Psychiatric: Mood and affect normal  Neck: No JVD, no carotid bruits, no thyromegaly, no lymphadenopathy.  Lungs:Clear bilaterally, no wheezes, rhonci, crackles Cardiovascular: Regular rate and rhythm. No murmurs, gallops or rubs. Abdomen:Soft. Bowel sounds present. Non-tender.  Extremities: No lower extremity edema. Pulses are 2 + in the bilateral DP/PT.  Echo 09/03/13: Left ventricle: The cavity size was normal. Systolic function was normal. The estimated ejection fraction was in the range of 60% to 65%. Wall motion was normal; there were no regional wall motion abnormalities. - Aortic valve: Possibly bicuspid; normal thickness leaflets. - Aorta: Dilated aortic root and ascending aorta measuring 40 mm. - Mitral valve: Structurally normal valve. There was no regurgitation. - Left atrium: The atrium was  normal in size. - Right ventricle: The cavity size was normal. Wall thickness was normal. - Right atrium: The atrium was normal in size. - Tricuspid valve: Structurally normal valve. There was no regurgitation. - Pulmonic valve: Structurally normal valve. - Inferior vena cava: The vessel was normal in size. - Pericardium, extracardiac: There was no pericardial effusion. Impressions: - Dilated aortic root and ascending aorta measuring 40 mm. There is possible dissection flap in the ascending aorta that needs to be further evaluated by either TEE or CTA.  Chest CTA 09/29/13:  1. Prominent ascending aorta measuring 3.7 cm. 2. Thoracic aorta atherosclerosis without dissection. 3. Age advanced coronary artery atherosclerosis. Recommend assessment of coronary risk factors and consideration of medical therapy. 4. Faint airspace disease especially involving the upper lobes, consistent with alveolitis. Correlation is recommended with pulmonary symptoms. 5. Small right thyroid nodule. No further evaluation is felt to be necessary. This follows ACR consensus guidelines: Managing Incidental Thyroid Nodules Detected on Imaging: White Paper of the ACR Incidental Thyroid Findings Committee. J Am Coll Radiol 2015; 12:143-150. 6. Possible adrenal hyperplasia.  Cardiac cath 04/22/14: Hemodynamic Findings: Ao: 134/74  LV: 132/8/18 RA: 1  RV: 38/4/8 PA: 33/12 (mean 21) PCWP: 4 Fick Cardiac Output: 3.8 L/min Fick Cardiac Index: 2.19 L/min/m2 Central Aortic Saturation: 99% Pulmonary Artery Saturation: 69%  Angiographic Findings: Left main: No obstructive disease Left Anterior Descending Artery: Large caliber vessel that courses to the apex. The proximal vessel has diffuse 20% stenosis. The mid vessel has a 50-60% stenosis. There is a small septal branch that arises from this plaque with ostial 90% stenosis. Several small caliber diagonal branches.  Circumflex Artery:  Large caliber vessel with large caliber first obtuse marginal branch. No obstructive disease.  Right Coronary Artery: Large dominant vessel with proximal and mid calcification, diffuse 20% stenosis in the proximal and mid vessel.  Left Ventricular Angiogram: LVEF=55-60%.  Impression: 1. Moderate non-obstructive CAD 2. Moderate mid LAD stenosis with FFR borderline at 0.82.  3. Normal LV systolic function  EKG:  EKG is not  ordered today.  The ekg ordered today demonstrates   Recent Labs: 05/14/2015: ALT 14; BUN 18; Creatinine, Ser 0.68; Hemoglobin 15.2; Platelets 255; Potassium 3.2; Sodium 133   Lipid Panel    Component Value Date/Time   CHOL 134 09/08/2014 0958   TRIG 96.0 09/08/2014 0958   HDL 49.70 09/08/2014 0958   CHOLHDL 3 09/08/2014 0958   VLDL 19.2 09/08/2014 0958   LDLCALC 65 09/08/2014 0958   LDLDIRECT 130.9 11/16/2008 1013     Wt Readings from Last 3 Encounters:  11/18/15 164 lb 9.6 oz (74.7 kg)  09/13/15 163 lb 9.6 oz (74.2 kg)  07/02/15 163 lb (73.9 kg)     Other studies Reviewed: Additional studies/ records that were reviewed today include: . Review of the above records demonstrates:    Assessment and Plan:   1. PVCs: She is on Flecainide and metoprolol.  2. CAD with unstable angina: Moderate LAD stenosis by cath 04/22/14. FFR of the LAD showed that this lesion is not flow limiting. She is now having chest pain c/w unstable angina. Will plan cardiac cath 12/03/15. Risks and benefits reviewed. She agrees to proceed. Pre-cath labs next week. Will continue ASA, beta blocker and statin. LDL at goal.   3. Tobacco abuse: Smoking cessation is encouraged. She does not wish to stop. Counseling given on cessation.   4. Bicuspid aortic valve: No aortic stenosis at this time. No murmur. Will follow.   Current medicines are reviewed at length with the patient today.  The patient does not have concerns regarding medicines.  The following changes have been made:    Labs/  tests ordered today include:   Orders Placed This Encounter  Procedures  . Basic Metabolic Panel (BMET)  . CBC w/Diff  . INR/PT    Disposition:   FU with me in 6 months  Signed, Lauree Chandler, MD 11/18/2015 1:15 PM    [facility] [address], [place], [place]  [address] Phone: [phone]; Fax: [phone]

## 2015-12-07 ENCOUNTER — Encounter (HOSPITAL_COMMUNITY): Payer: Self-pay | Admitting: Cardiovascular Disease

## 2015-12-13 ENCOUNTER — Other Ambulatory Visit: Payer: Self-pay | Admitting: Family Medicine

## 2015-12-14 ENCOUNTER — Ambulatory Visit: Payer: Medicare Other | Admitting: Cardiology

## 2015-12-30 ENCOUNTER — Encounter: Payer: Self-pay | Admitting: Cardiology

## 2015-12-30 ENCOUNTER — Ambulatory Visit (INDEPENDENT_AMBULATORY_CARE_PROVIDER_SITE_OTHER): Payer: Medicare Other | Admitting: Cardiology

## 2015-12-30 VITALS — BP 132/84 | HR 68 | Ht 61.0 in | Wt 166.1 lb

## 2015-12-30 DIAGNOSIS — R61 Generalized hyperhidrosis: Secondary | ICD-10-CM | POA: Diagnosis not present

## 2015-12-30 DIAGNOSIS — I493 Ventricular premature depolarization: Secondary | ICD-10-CM | POA: Diagnosis not present

## 2015-12-30 LAB — TSH: TSH: 1.67 m[IU]/L

## 2015-12-30 LAB — MAGNESIUM: Magnesium: 1.9 mg/dL (ref 1.5–2.5)

## 2015-12-30 NOTE — Patient Instructions (Addendum)
Medication Instructions:   Your physician recommends that you continue on your current medications as directed. Please refer to the Current Medication list given to you today.   If you need a refill on your cardiac medications before your next appointment, please call your pharmacy.  Labwork:  TSH AND MAGNESIUM    Testing/Procedures: NONE ORDER TODAY    Follow-Up: Your physician wants you to follow-up in:  [place]  [place] will receive a reminder letter in the mail two months in advance. If you don't receive a letter, please call our office to schedule the follow-up appointment.      Any Other Special Instructions Will Be Listed Below (If Applicable).

## 2015-12-30 NOTE — Progress Notes (Signed)
12/30/2015 [name] [name]   Mar 06, 1951  [id number]  Primary Physician [name], MD Primary Cardiologist: Dr. Angelena Form Electrophysiologist: Dr. Rayann Heman   Reason for Visit/CC: f/u for CAD; s/p LHC  HPI:  65 y/o female with history of HTN, HLD, GERD, OA, depression, ongoing tobacco abuse, COPD,  Palpitations/ symptomatic PVCs . She is followed by Dr. Angelena Form. Dr. Rayann Heman follows her in EP clinic for her PVCs, which have been felt to be too infrequent for ablation. She is on AAD therapy with Flecainide.  She was recently seen by  Dr. Angelena Form in clinic and complained of symptoms concerning for unstable angina. She was referred for left heart catheterization. She was found to have moderate nonobstructive CAD, unchange dfrom prior catheterization in 2015, with negative FFR testing of a mid LAD lesion. No PCI was indicated and continued medical therapy was recommended. Left ventricular ejection fraction was normal by cath.  She presents to  clinic today for post procedural follow-up. Her main complaint today is continuation of symptomatic PVCs. They continue to happen frequently but she reports that it has not gotten any worse. She reports full compliance with flecainide. She continues to have occasional chest discomfort, which seems associated with the palpitations. She has also been fully compliant with Metroprolol. Resting heart rate is currently in the 60s. She reports that she has had issues in the past with bradycardia. She has cut back on caffeine intake. She has reduced her coffee consumption from 2 cups a day down to one cup a day. She denies any associated dyspnea. No lightheadedness, dizziness, syncope/ near syncope. She does however report a history of unexplained profuse diaphoresis that occurs frequently. See also reports significant weight loss last year that was unexplained. Last TSH that was checked was in June 2016. It was normal. Recent LHC details outlined below.    Procedures    Left Heart Cath and Coronary Angiography  Conclusion     Prox RCA to Mid RCA lesion, 20 %stenosed.  Mid RCA to Dist RCA lesion, 20 %stenosed.  2nd Mrg lesion, 20 %stenosed.  1st Mrg lesion, 20 %stenosed.  Ost LAD to Prox LAD lesion, 10 %stenosed.  Mid LAD-2 lesion, 30 %stenosed.  Mid LAD-1 lesion, 50 %stenosed.  Dist LAD lesion, 30 %stenosed.  The left ventricular systolic function is normal.  LV end diastolic pressure is normal.  The left ventricular ejection fraction is greater than 65% by visual estimate.  There is no mitral valve regurgitation.   1. Mild to moderate non-obstructive CAD 2. Moderate mid LAD stenosis. This is unchanged from last cath in 2016. This does not appear to be flow limiting. (FFR in 2016 of the mid LAD was 0.85). 3. Mild non-obstructive disease in the RCA and Circumflex 4. Normal LV systolic function  Recommendations: Continue medical management of CAD. Smoking cessation recommended.      Current Meds  Medication Sig  . albuterol (PROVENTIL HFA;VENTOLIN HFA) 108 (90 Base) MCG/ACT inhaler Inhale 2 puffs into the lungs every 6 (six) hours as needed for wheezing.  Marland Kitchen ALPRAZolam (XANAX) 0.25 MG tablet Take 0.75 mg by mouth 3 (three) times daily. TAKE 3 TABLETS BY MO)UTH EVERY 6 HOURS AS NEEDED FOR ANXIETY  . aspirin EC 81 MG tablet Take 1 tablet (81 mg total) by mouth daily.  Marland Kitchen atorvastatin (LIPITOR) 20 MG tablet TAKE 1 TABLET BY MOUTH EVERY DAY  . buPROPion (WELLBUTRIN XL) 150 MG 24 hr tablet TAKE 1 TABLET (150 MG TOTAL) BY MOUTH DAILY.  Marland Kitchen  cetirizine (ZYRTEC) 10 MG tablet Take 10 mg by mouth daily as needed for allergies.   . flecainide (TAMBOCOR) 50 MG tablet Take 1 tablet (50 mg total) by mouth 3 (three) times daily.  . hydrochlorothiazide (HYDRODIURIL) 25 MG tablet TAKE 1 TABLET BY MOUTH EVERY DAY  . HYDROcodone-acetaminophen (NORCO/VICODIN) 5-325 MG tablet Take 1 tablet by mouth every 6 (six) hours as needed for moderate pain.  Marland Kitchen  ibuprofen (ADVIL,MOTRIN) 200 MG tablet Take 400 mg by mouth every 6 (six) hours as needed for moderate pain.   Marland Kitchen lisinopril (PRINIVIL,ZESTRIL) 20 MG tablet Take 1 tablet (20 mg total) by mouth daily.  . metoprolol (LOPRESSOR) 50 MG tablet TAKE 1 TABLET (50 MG TOTAL) BY MOUTH 2 (TWO) TIMES DAILY.  . nitroGLYCERIN (NITROSTAT) 0.4 MG SL tablet Place 0.4 mg under the tongue every 5 (five) minutes as needed for chest pain (MAX 3 TABLETS). Reported on 07/02/2015  . pantoprazole (PROTONIX) 40 MG tablet TAKE 1 TABLET (40 MG TOTAL) BY MOUTH 2 (TWO) TIMES DAILY.  Marland Kitchen prochlorperazine (COMPAZINE) 10 MG tablet Take 1 tablet (10 mg total) by mouth every 6 (six) hours as needed for nausea or vomiting.  . SYMBICORT 160-4.5 MCG/ACT inhaler Inhale 2 puffs into the lungs 2 (two) times daily. Reported on 07/02/2015   Allergies  Allergen Reactions  . Dexamethasone Other (See Comments)    Pt thinks they used it during surgery and she quit breathing, not sure this was the exact medication.  . Augmentin [Amoxicillin-Pot Clavulanate] Nausea And Vomiting  . Clindamycin/Lincomycin Nausea And Vomiting  . Oxycodone-Acetaminophen Nausea And Vomiting   Past Medical History:  Diagnosis Date  . Allergic rhinitis   . Allergy    seasonal  . Anxiety   . Cataract   . Complication of anesthesia 2003   Medication for block "went up to my brain and I stopped breathing"  . COPD (chronic obstructive pulmonary disease) ([place])   . Depression   . Dexamethasone adverse reaction    2002 normal  . GERD (gastroesophageal reflux disease)   . Hyperlipidemia   . Hypertension   . Insomnia   . Osteoarthritis   . Overactive bladder   . Premature ventricular contractions    LBBB inferior axis PVCs  . Tobacco abuse   . Ulcer    duodenal   Family History  Problem Relation Age of Onset  . CAD Brother   . Kidney disease Brother   . Emphysema Mother   . Other Father     brain tumor  . Throat cancer Brother   . Hyperlipidemia    .  Hypertension    . Kidney disease    . Heart disease    . Colon cancer Neg Hx   . Esophageal cancer Neg Hx   . Stomach cancer Neg Hx   . Rectal cancer Neg Hx    Past Surgical History:  Procedure Laterality Date  . CARDIAC CATHETERIZATION N/A 12/03/2015   Procedure: Left Heart Cath and Coronary Angiography;  Surgeon: Burnell Blanks, MD;  Location: [facility];  Service: Cardiovascular;  Laterality: N/A;  . CARPAL TUNNEL RELEASE Left    1997 left,   . CHOLECYSTECTOMY N/A 03/23/2015   Procedure: LAPAROSCOPIC CHOLECYSTECTOMY;  Surgeon: Ralene Ok, MD;  Location: [facility];  Service: General;  Laterality: N/A;  . COLONOSCOPY  10-17-13   per Dr. Olevia Perches, benign polyps, repeat in 10 years  . EUS N/A 11/12/2014   Procedure: UPPER ENDOSCOPIC ULTRASOUND (EUS) LINEAR;  Surgeon: Quillian Quince  Merrily Brittle, MD;  Location: [facility];  Service: Endoscopy;  Laterality: N/A;  . LEFT AND RIGHT HEART CATHETERIZATION WITH CORONARY ANGIOGRAM N/A 04/22/2014   Procedure: LEFT AND RIGHT HEART CATHETERIZATION WITH CORONARY ANGIOGRAM;  Surgeon: Burnell Blanks, MD;  Location: [facility];  Service: Cardiovascular;  Laterality: N/A;  . TONSILLECTOMY    . torn cartilage repaired rt wrist Right 2003   Social History   Social History  . Marital status: Single    Spouse name: N/A  . Number of children: 0  . Years of education: N/A   Occupational History  . CNA    Social History Main Topics  . Smoking status: Current Every Day Smoker    Packs/day: 0.45    Years: 54.00    Types: Cigarettes  . Smokeless tobacco: Never Used     Comment: 1/2 pack per day or less  . Alcohol use No  . Drug use: No  . Sexual activity: Not on file   Other Topics Concern  . Not on file   Social History Narrative   Works as Quarry manager at [employer].     Review of Systems: General: negative for chills, fever, night sweats or weight changes.  Cardiovascular: negative for chest pain, dyspnea on exertion, edema,  orthopnea, palpitations, paroxysmal nocturnal dyspnea or shortness of breath Dermatological: negative for rash Respiratory: negative for cough or wheezing Urologic: negative for hematuria Abdominal: negative for nausea, vomiting, diarrhea, bright red blood per rectum, melena, or hematemesis Neurologic: negative for visual changes, syncope, or dizziness All other systems reviewed and are otherwise negative except as noted above.   Physical Exam:  Blood pressure 132/84, pulse 68, height 5\' 1"  (1.549 m), weight 166 lb 1.9 oz (75.4 kg).  General appearance: alert, cooperative and no distress, dentition in poor repair  Neck: no carotid bruit and no JVD Lungs: clear to auscultation bilaterally Heart: regular rate and rhythm, S1, S2 normal, no murmur, click, rub or gallop Extremities: no LEE Pulses: 2+ and symmetric Skin: warm and dry Neurologic: Grossly normal  EKG not performed   ASSESSMENT AND PLAN:   1. CAD: recent cath unchanged from prior with moderate nonobstructive CAD and neagtive FFR testing on a moderate mid LAD lesion. Continued medical therapy recommended. She continues to have occasional recurrent CP, but more in relation to palpations. Known h/o PVCs. We discussed trying Imdur however for possible small vessel disase but patient does not want to try due to fear of HA side effect. She tries to avoid SL NTG as much as possible. Continue medical therapy for prevention of disease progression. Continue ASA, statin, BB and lisinopril. Unable to titrate BB given HR in the 60s and h/o bradycardia.   2. PVCs: still symptomatic. She is followed by Dr. Rayann Heman. Prior evaluation by holter monitor showed PVCs are not frequent enough to consider ablation. Continue Flecainide and metoprolol. No room to increase metoprolol, given baseline HR. Pt notes HR sometimes drops below 60s at home. No syncope/ near syncope. Pt advised to avoid triggers including caffeine. She does note frequent excessive  diaphoresis and unintended weight loss over the past year. Recent CBC showed stable blood counts and K. Will check TSH and Mg level.   PLAN  F/u with Dr. Angelena Form in 6 months. Continue routine f/u with Dr. Rayann Heman as advised.   Lyda Jester [facility] 12/30/2015 12:29 PM

## 2016-01-05 ENCOUNTER — Other Ambulatory Visit: Payer: Self-pay | Admitting: Family Medicine

## 2016-01-20 ENCOUNTER — Other Ambulatory Visit: Payer: Self-pay | Admitting: Family Medicine

## 2016-01-20 NOTE — Telephone Encounter (Signed)
Call in #[phone] with 1 rf

## 2016-01-24 ENCOUNTER — Other Ambulatory Visit: Payer: Self-pay | Admitting: Cardiovascular Disease

## 2016-01-25 ENCOUNTER — Other Ambulatory Visit: Payer: Self-pay | Admitting: Cardiovascular Disease

## 2016-01-26 NOTE — Telephone Encounter (Signed)
metoprolol (LOPRESSOR) 50 MG tablet  Medication  Date: 01/24/2016 Department: [address] Office Ordering/Authorizing: Burnell Blanks, MD  Order Providers   Prescribing Provider Encounter Provider  Burnell Blanks, MD Burnell Blanks, MD  Medication Detail    Disp Refills Start End   metoprolol (LOPRESSOR) 50 MG tablet 180 tablet 3 01/24/2016    Sig: TAKE 1 TABLET (50 MG TOTAL) BY MOUTH 2 (TWO) TIMES DAILY.   E-Prescribing Status: Receipt confirmed by pharmacy (01/24/2016 12:26 PM EDT)   Pharmacy   [facility] #[id number] [address] [place], [place]

## 2016-02-02 ENCOUNTER — Ambulatory Visit (INDEPENDENT_AMBULATORY_CARE_PROVIDER_SITE_OTHER): Payer: Medicare Other | Admitting: Family Medicine

## 2016-02-02 DIAGNOSIS — Z23 Encounter for immunization: Secondary | ICD-10-CM

## 2016-02-13 ENCOUNTER — Other Ambulatory Visit: Payer: Self-pay | Admitting: Family Medicine

## 2016-03-09 ENCOUNTER — Ambulatory Visit (INDEPENDENT_AMBULATORY_CARE_PROVIDER_SITE_OTHER): Payer: Medicare Other | Admitting: Family Medicine

## 2016-03-09 ENCOUNTER — Encounter: Payer: Self-pay | Admitting: Family Medicine

## 2016-03-09 VITALS — BP 127/93 | HR 59 | Temp 98.4°F | Ht 61.0 in | Wt 170.0 lb

## 2016-03-09 DIAGNOSIS — S29019A Strain of muscle and tendon of unspecified wall of thorax, initial encounter: Secondary | ICD-10-CM | POA: Diagnosis not present

## 2016-03-09 DIAGNOSIS — M545 Low back pain, unspecified: Secondary | ICD-10-CM | POA: Insufficient documentation

## 2016-03-09 DIAGNOSIS — M542 Cervicalgia: Secondary | ICD-10-CM

## 2016-03-09 DIAGNOSIS — M546 Pain in thoracic spine: Secondary | ICD-10-CM

## 2016-03-09 DIAGNOSIS — G8929 Other chronic pain: Secondary | ICD-10-CM

## 2016-03-09 DIAGNOSIS — M5442 Lumbago with sciatica, left side: Secondary | ICD-10-CM

## 2016-03-09 DIAGNOSIS — M5441 Lumbago with sciatica, right side: Secondary | ICD-10-CM

## 2016-03-09 HISTORY — DX: Low back pain, unspecified: M54.50

## 2016-03-09 HISTORY — DX: Other chronic pain: G89.29

## 2016-03-09 MED ORDER — MELOXICAM 15 MG PO TABS: 15.0000 mg | ORAL_TABLET | Freq: Every day | ORAL | 11 refills | Status: DC

## 2016-03-09 MED ORDER — HYDROCODONE-ACETAMINOPHEN 10-325 MG PO TABS: 1.0000 | ORAL_TABLET | Freq: Four times a day (QID) | ORAL | 0 refills | Status: DC | PRN

## 2016-03-09 MED ORDER — CYCLOBENZAPRINE HCL 10 MG PO TABS: 10.0000 mg | ORAL_TABLET | Freq: Three times a day (TID) | ORAL | 5 refills | Status: DC | PRN

## 2016-03-09 MED ORDER — METHYLPREDNISOLONE 4 MG PO TBPK: ORAL_TABLET | ORAL | 0 refills | Status: DC

## 2016-03-09 NOTE — Progress Notes (Signed)
Pre visit review using our clinic review tool, if applicable. No additional management support is needed unless otherwise documented below in the visit note. 

## 2016-03-09 NOTE — Progress Notes (Signed)
   Subjective:    Patient ID: [name] [name], female    DOB: 12/15/1950, 65 y.o.   MRN: [id number]  HPI Here for 2 different problems. First she has had chronic stiffness and pain in the entire spine for years, but this has gotten much worse over the past 6 months. She wants to work for several more years but this is becoming increasingly difficult. She uses Ibuprofen and hydrocodone as needed. She has known osteoarthritis throughout her body. Also she injured her back this past weekend while on the job. She was lifting a heavy box of medical supplies when she felt a sudden sharp pain in the left side of the middle back. Since then she has felt muscle spasms and pain in this area. Heat and BioFreeze have not helped.   Review of Systems  Constitutional: Negative.   Musculoskeletal: Positive for back pain, myalgias, neck pain and neck stiffness.       Objective:   Physical Exam  Constitutional: She is oriented to person, place, and time. She appears well-developed and well-nourished.  In pain   Cardiovascular: Normal rate, regular rhythm, normal heart sounds and intact distal pulses.   Pulmonary/Chest: Effort normal and breath sounds normal.  Musculoskeletal:  She is tender along the left thoracic back area with spasm and reduced ROM   Neurological: She is alert and oriented to person, place, and time.          Assessment & Plan:  She has an acute back strain that we will treat with a Medrol dose pack and Flexeril. She is written out of work today and tomorrow. For the chronic arthritis pain she will try Meloxicam daily. Add Norco prn.  [name], MD

## 2016-03-14 ENCOUNTER — Telehealth: Payer: Self-pay | Admitting: Family Medicine

## 2016-03-15 ENCOUNTER — Other Ambulatory Visit: Payer: Self-pay | Admitting: Family Medicine

## 2016-03-16 NOTE — Telephone Encounter (Signed)
Pt states that Meloxicam says not to take if you take Lisinopril w/o Dr's approval so she wanted to touch base and make sure its okay to take.  Also, appt made for f/u and labs on lipator.

## 2016-03-16 NOTE — Telephone Encounter (Signed)
Yes it is okay to take Meloxicam with Lisinopril.

## 2016-03-16 NOTE — Telephone Encounter (Signed)
Pt notified ok to take lisinopril and meloxicam together per Dr. Sarajane Jews.  Pt will take medication.

## 2016-03-22 ENCOUNTER — Ambulatory Visit (INDEPENDENT_AMBULATORY_CARE_PROVIDER_SITE_OTHER): Payer: Medicare Other | Admitting: Family Medicine

## 2016-03-22 ENCOUNTER — Encounter: Payer: Self-pay | Admitting: Family Medicine

## 2016-03-22 VITALS — BP 131/95 | HR 73 | Temp 98.8°F | Ht 61.0 in | Wt 168.0 lb

## 2016-03-22 DIAGNOSIS — I10 Essential (primary) hypertension: Secondary | ICD-10-CM | POA: Diagnosis not present

## 2016-03-22 DIAGNOSIS — E782 Mixed hyperlipidemia: Secondary | ICD-10-CM | POA: Diagnosis not present

## 2016-03-22 DIAGNOSIS — R251 Tremor, unspecified: Secondary | ICD-10-CM | POA: Diagnosis not present

## 2016-03-22 LAB — LIPID PANEL
Cholesterol: 144 mg/dL (ref 0–200)
HDL: 58 mg/dL (ref >39.00)
LDL Cholesterol: 54 mg/dL (ref 0–99)
NonHDL: 85.5
Total CHOL/HDL Ratio: 2
Triglycerides: 157 mg/dL — ABNORMAL HIGH (ref 0.0–149.0)
VLDL: 31.4 mg/dL (ref 0.0–40.0)

## 2016-03-22 LAB — HEPATIC FUNCTION PANEL
ALT: 7 U/L (ref 0–35)
AST: 12 U/L (ref 0–37)
Albumin: 4 g/dL (ref 3.5–5.2)
Alkaline Phosphatase: 58 U/L (ref 39–117)
Bilirubin, Direct: 0.1 mg/dL (ref 0.0–0.3)
Total Bilirubin: 0.4 mg/dL (ref 0.2–1.2)
Total Protein: 6.7 g/dL (ref 6.0–8.3)

## 2016-03-22 MED ORDER — ATORVASTATIN CALCIUM 20 MG PO TABS: 20.0000 mg | ORAL_TABLET | Freq: Every day | ORAL | 3 refills | Status: DC

## 2016-03-22 NOTE — Progress Notes (Signed)
   Subjective:    Patient ID: [name] [name], female    DOB: 01/06/1951, 65 y.o.   MRN: [id number]  HPI Here to follow up on lipids and to discuss her hands shaking. She has had tremors for several years but they are getting worse. It now affects her job. She has trouble with dropping things and her hands feel numb at times. No weakness. She is here for fasting labs.    Review of Systems  Constitutional: Negative.   Respiratory: Negative.   Cardiovascular: Negative.   Neurological: Positive for tremors and numbness. Negative for dizziness, seizures, syncope, facial asymmetry, speech difficulty, weakness, light-headedness and headaches.       Objective:   Physical Exam  Constitutional: She is oriented to person, place, and time. She appears well-developed and well-nourished.  Neck: No thyromegaly present.  Cardiovascular: Normal rate, regular rhythm, normal heart sounds and intact distal pulses.   Pulmonary/Chest: Effort normal and breath sounds normal.  Lymphadenopathy:    She has no cervical adenopathy.  Neurological: She is alert and oriented to person, place, and time. No cranial nerve deficit. Coordination normal.  Resting tremors in both hands           Assessment & Plan:  For the lipids, she will have labs drawn today. For the numbness and tremors, we will refer her to Neurology. Alysia Penna, MD

## 2016-03-22 NOTE — Progress Notes (Signed)
Pre visit review using our clinic review tool, if applicable. No additional management support is needed unless otherwise documented below in the visit note. 

## 2016-03-29 IMAGING — CT CT ABD-PELV W/ CM
2 of 5 series · 15 of 46 positions shown, 17 images · IV contrast (Omnipaque 300)
Comparison: No priors.

CLINICAL DATA: 63-year-old female with history of 30 pound weight
loss, decreased appetite, abdominal pain and nausea for the past 3-4
months.

EXAM:
CT ABDOMEN AND PELVIS WITH CONTRAST
TECHNIQUE: Multidetector CT imaging of the abdomen and pelvis was performed
using the standard protocol following bolus administration of
intravenous contrast.
CONTRAST:  100mL OMNIPAQUE IOHEXOL 300 MG/ML  SOLN

[Series 2: abd/ pel 5mm · axial · 0.68mm/px · z∈[-412,-36]mm · 12 of 85 slices shown, 14 images]
[im 5/85  soft-tissue]
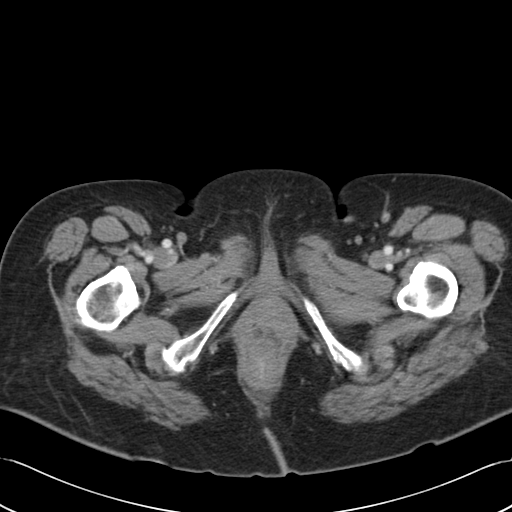
[im 5/85  bone]
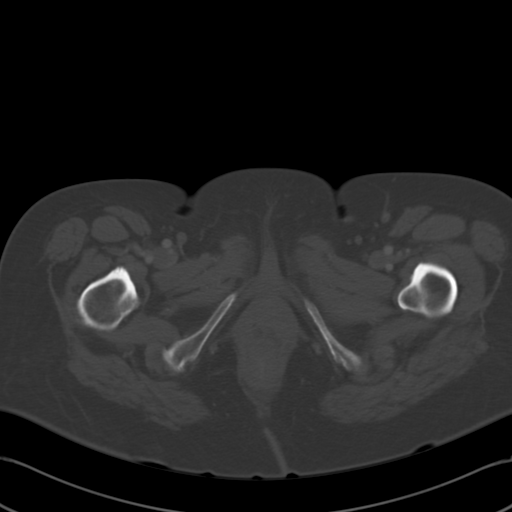
[im 13/85  soft-tissue]
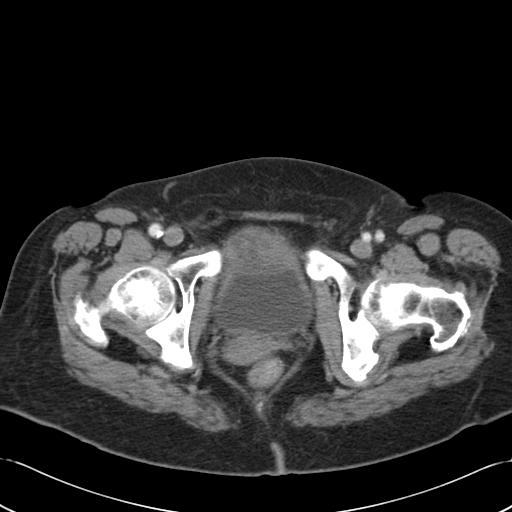
[im 17/85  soft-tissue]
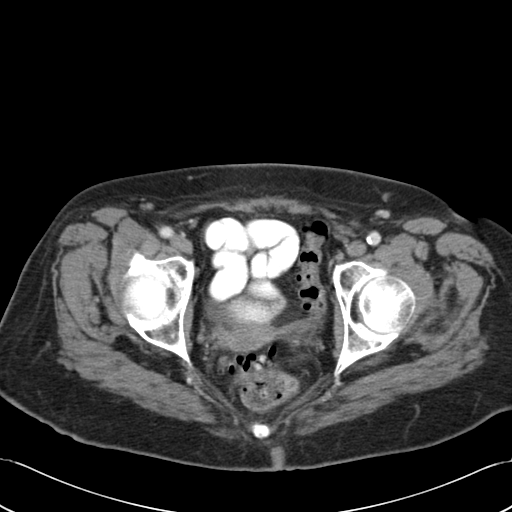
[im 26/85  soft-tissue]
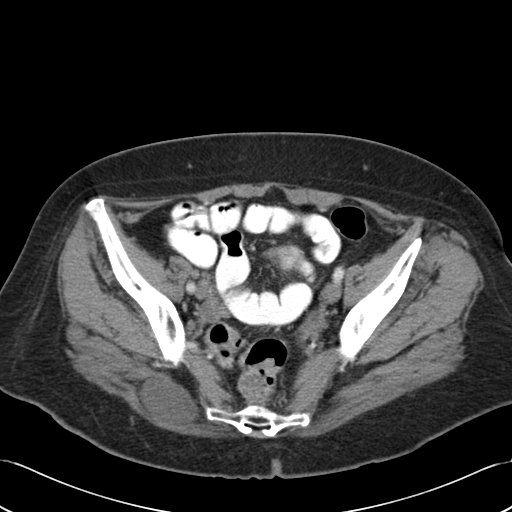
[im 34/85  soft-tissue]
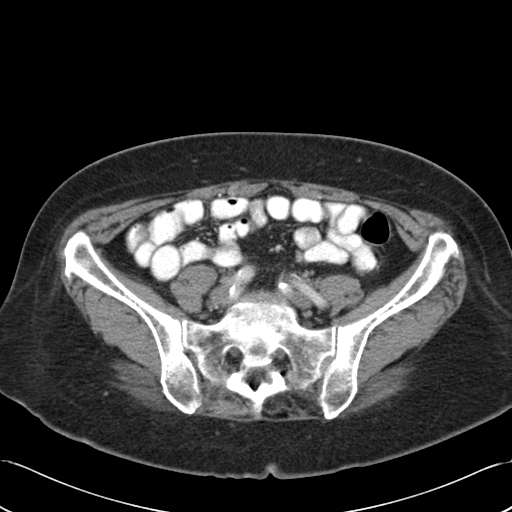
[im 38/85  soft-tissue]
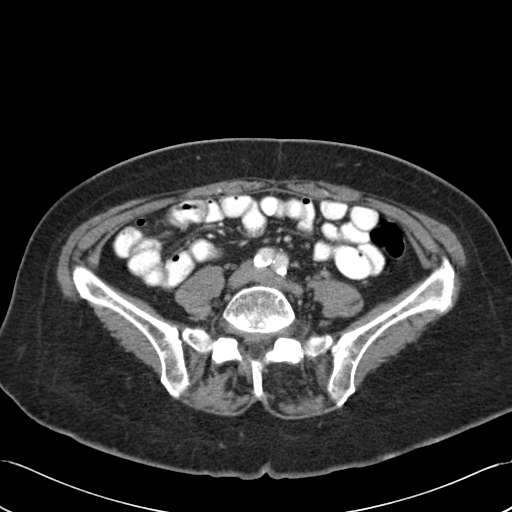
[im 47/85  soft-tissue]
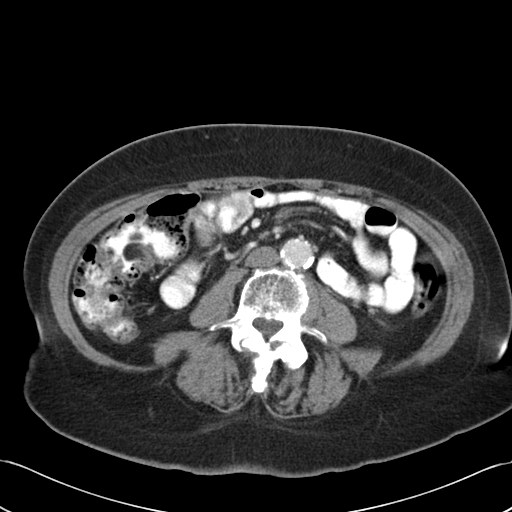
[im 51/85  soft-tissue]
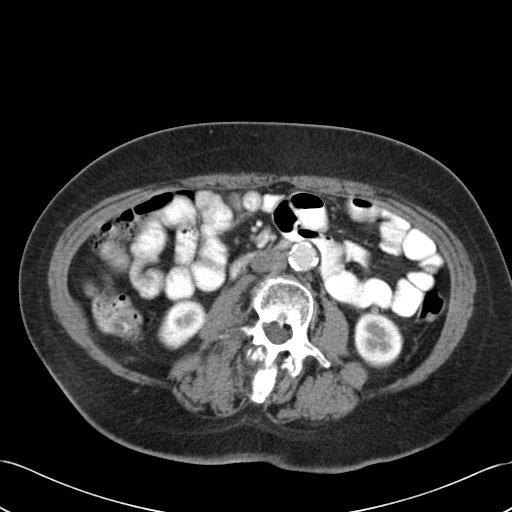
[im 59/85  soft-tissue]
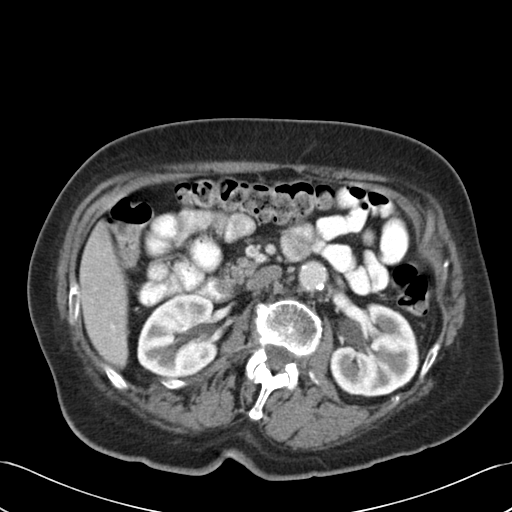
[im 59/85  bone]
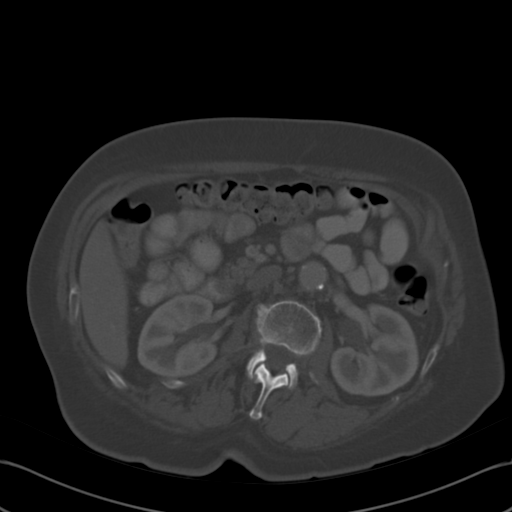
[im 68/85  soft-tissue]
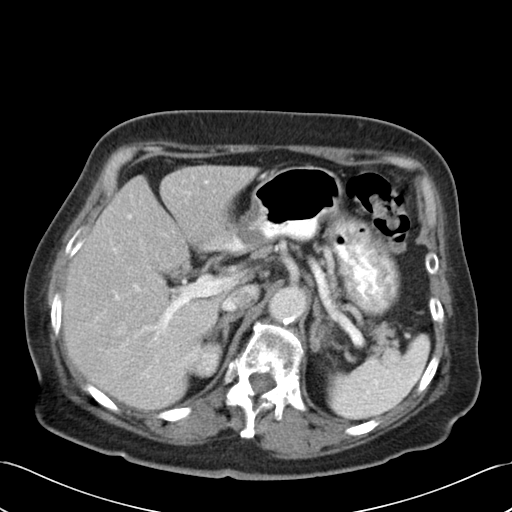
[im 72/85  soft-tissue]
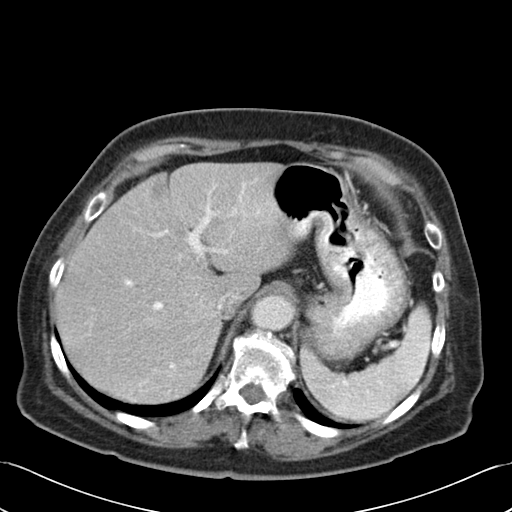
[im 80/85  soft-tissue]
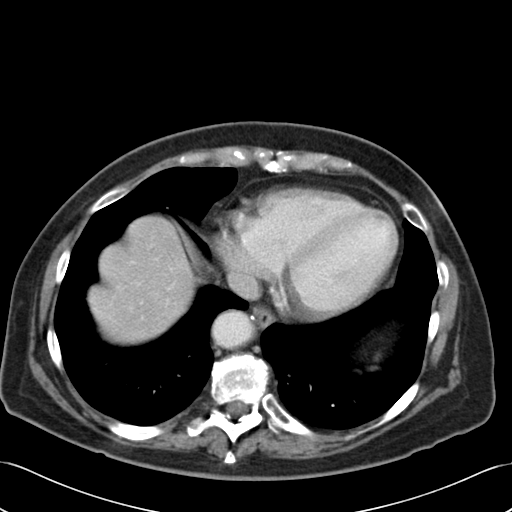

[Series 602: cor · coronal · 0.86mm/px · 3 of 107 slices shown]
[im 36/107  soft-tissue]
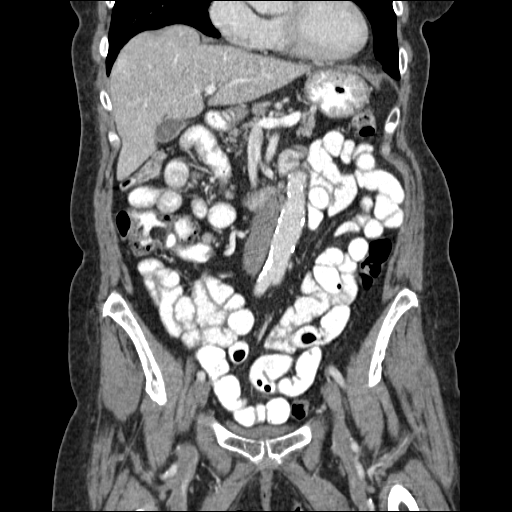
[im 48/107  soft-tissue]
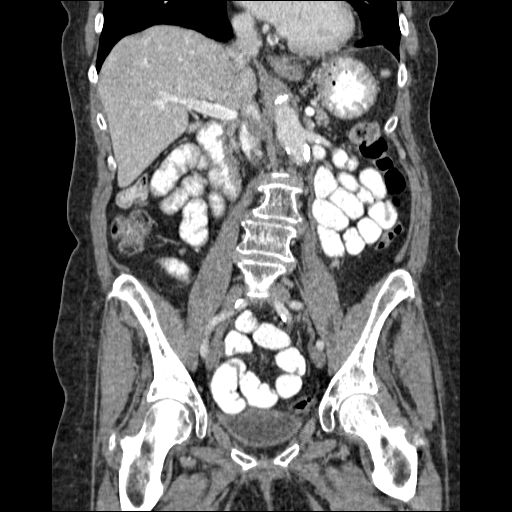
[im 59/107  soft-tissue]
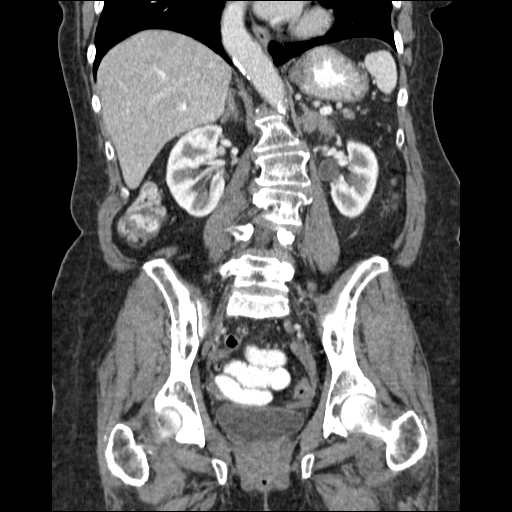

[15 of 46 positions shown; findings below may reference images not displayed]

FINDINGS: Lower chest: Atherosclerotic calcifications in the right coronary
artery. Linear scarring in the right middle lobe.

Hepatobiliary: Sub cm low-attenuation lesion in the periphery of
segment 8 of the liver, too small to definitively characterize, but
favored to represent a cyst. No other suspicious hepatic lesions are
noted. No intra or extrahepatic biliary ductal dilatation.
Gallbladder is normal in appearance.

Pancreas: No pancreatic mass. No pancreatic ductal dilatation. No
pancreatic or peripancreatic fluid or inflammatory changes.

Spleen: Unremarkable.

Adrenals/Urinary Tract: 1.8 cm low-attenuation nonenhancing lesion
in the lateral aspect of the upper pole of the right kidney is
compatible with a simple cyst. Left kidney is normal in appearance.
No hydroureteronephrosis. Urinary bladder is normal in appearance.
Nodular thickening of the left adrenal gland, most evident in the
medial limb, where the gland measures up to 2.1 x 1.2 cm, unchanged
compared to prior CT scan 09/29/2013, presumably some nodular
hyperplasia, or underlying benign lesions such as adenoma(s).

Stomach/Bowel: The appearance of the stomach is normal. In the
proximal aspect of the second portion of the duodenum there is a
x 1.1 x 1.2 cm filling defect (image 8 of series 5), suspicious for
a small pedunculated lesion associated with the duodenal wall. No
pathologic dilatation of small bowel or colon. Numerous colonic
diverticulae are noted, particularly in the sigmoid colon, without
surrounding inflammatory changes to suggest an acute diverticulitis
at this time. Normal appendix.

Vascular/Lymphatic: Atherosclerosis throughout the abdominal and
pelvic vasculature, without evidence of aneurysm or dissection. No
lymphadenopathy noted in the abdomen or pelvis.

Reproductive: Uterus and ovaries are unremarkable in appearance.

Other: No significant volume of ascites.  No pneumoperitoneum.

Musculoskeletal: There are no aggressive appearing lytic or blastic
lesions noted in the visualized portions of the skeleton.
IMPRESSION: 1. 1.2 x 1.1 x 1.2 cm filling defect in the proximal aspect of the
second portion of the duodenum. This could represent a small bowel
adenocarcinoma, neuroendocrine lesion or could simply represent a
benign lesion such as a polyp. Correlation with endoscopy is
recommended in the near future.
2. No other potential malignancy identified in the abdomen or pelvis
on today's examination. No other findings to account for the
patient's history of weight loss.
3. Colonic diverticulosis without evidence of acute diverticulitis
at this time.
4. Extensive atherosclerosis, including right coronary artery
disease. Please note that although the presence of coronary artery
calcium documents the presence of coronary artery disease, the
severity of this disease and any potential stenosis cannot be
assessed on this non-gated CT examination. Assessment for potential
risk factor modification, dietary therapy or pharmacologic therapy
may be warranted, if clinically indicated.
5. Additional incidental findings, as above.

## 2016-04-07 ENCOUNTER — Other Ambulatory Visit: Payer: Self-pay | Admitting: Family Medicine

## 2016-04-26 ENCOUNTER — Telehealth: Payer: Self-pay | Admitting: Gastroenterology

## 2016-04-26 NOTE — Telephone Encounter (Signed)
Left message on machine to call back  

## 2016-04-27 NOTE — Telephone Encounter (Signed)
Left message on machine to call back  

## 2016-04-27 NOTE — Telephone Encounter (Signed)
Yes, she needs upper EUS radial +/- linear, next available MAC Thursday for abnromal duodenum.  Thanks

## 2016-04-27 NOTE — Telephone Encounter (Signed)
Dr Ardis Hughs please review the pt is on the recall list for EUS in Feb.  Does she need to have repeat EUS?

## 2016-04-28 ENCOUNTER — Encounter: Payer: Self-pay | Admitting: Gastroenterology

## 2016-05-03 NOTE — Telephone Encounter (Signed)
Left message on machine to call back (does pt want to set up recall EUS as requested)

## 2016-05-04 ENCOUNTER — Telehealth: Payer: Self-pay | Admitting: Family Medicine

## 2016-05-04 NOTE — Telephone Encounter (Signed)
She is not due for a refill until 07-21-16

## 2016-05-04 NOTE — Telephone Encounter (Signed)
OK to refill  Thanks

## 2016-05-08 MED ORDER — ALPRAZOLAM 0.25 MG PO TABS: ORAL_TABLET | ORAL | 2 refills | Status: DC

## 2016-05-08 NOTE — Telephone Encounter (Signed)
I understand. Call in #[phone] with 2 rf

## 2016-05-08 NOTE — Telephone Encounter (Signed)
[name], MD 4 days ago     She is not due for a refill until 07-21-16

## 2016-05-08 NOTE — Telephone Encounter (Signed)
Is patient taking 12 tablets every day? If so, she might need a refill.

## 2016-05-08 NOTE — Telephone Encounter (Signed)
Unable to reach pt by phone, several messages have been left.  Letter mailed.

## 2016-05-08 NOTE — Telephone Encounter (Signed)
[facility] and spoke [facility]. 

## 2016-05-08 NOTE — Telephone Encounter (Signed)
Pt states her dose 3 tabs at one time(per dose).  Pt got 360 tabs in Oct with refills.  Pt states she has only a few left and will not be able to wait until April.   [facility] [address]

## 2016-05-08 NOTE — Addendum Note (Signed)
Addended by: Aggie Hacker A on: 05/08/2016 03:32 PM   Modules accepted: Orders

## 2016-05-10 ENCOUNTER — Ambulatory Visit: Payer: Medicare Other | Admitting: Neurology

## 2016-05-15 ENCOUNTER — Ambulatory Visit: Payer: Medicare Other | Admitting: Nurse Practitioner

## 2016-05-17 ENCOUNTER — Telehealth: Payer: Self-pay | Admitting: Gastroenterology

## 2016-05-17 DIAGNOSIS — H43813 Vitreous degeneration, bilateral: Secondary | ICD-10-CM | POA: Diagnosis not present

## 2016-05-17 DIAGNOSIS — H40053 Ocular hypertension, bilateral: Secondary | ICD-10-CM | POA: Diagnosis not present

## 2016-05-17 DIAGNOSIS — H2513 Age-related nuclear cataract, bilateral: Secondary | ICD-10-CM | POA: Diagnosis not present

## 2016-05-17 DIAGNOSIS — H35033 Hypertensive retinopathy, bilateral: Secondary | ICD-10-CM | POA: Diagnosis not present

## 2016-05-18 ENCOUNTER — Other Ambulatory Visit: Payer: Self-pay

## 2016-05-18 DIAGNOSIS — K319 Disease of stomach and duodenum, unspecified: Secondary | ICD-10-CM

## 2016-05-18 NOTE — Telephone Encounter (Signed)
EUS scheduled, pt instructed and medications reviewed.  Patient instructions mailed to home.  Patient to call with any questions or concerns.  

## 2016-05-18 NOTE — Telephone Encounter (Signed)
Left message on machine to call back  

## 2016-05-23 NOTE — Progress Notes (Signed)
 Electrophysiology Office Note   Date:  05/24/2016   ID:  [name] [name], DOB 04/29/1950, MRN [id number]  PCP:  Stephen Fry, MD  Cardiologist:  Dr McAlhany Primary Electrophysiologist: Allred    History of Present Illness: [name] [name] is a 65 y.o. female seen today for Dr Allred. She presents today for routine electrophysiology follow-up of PVC's.  She was intolerant of Inderal and Metoprolol. She has been maintained on low dose Flecainide as she was intolerant of higher doses. At last office visit, Dr Allred offered trial of Propafenone, but she did not wish to take at that time.  Since last EP visit, she underwent LHC which demonstrated continued non-obstructive CAD.  Her palpitations have been stable. Her biggest concern today is of fatigue. She previously had sleep study ordered, but as she works 3rd shift did not complete.   Today, she denies symptoms of chest pain, orthopnea, PND, lower extremity edema, claudication, dizziness, presyncope, syncope, bleeding, or neurologic sequela. The patient is tolerating medications without difficulties and is otherwise without complaint today.    Past Medical History:  Diagnosis Date  . Allergic rhinitis   . Allergy    seasonal  . Anxiety   . Cataract   . Complication of anesthesia 2003   Medication for block "went up to my brain and I stopped breathing"  . COPD (chronic obstructive pulmonary disease) (HCC)   . Depression   . Dexamethasone adverse reaction    2002 normal  . GERD (gastroesophageal reflux disease)   . Hyperlipidemia   . Hypertension   . Insomnia   . Osteoarthritis   . Overactive bladder   . Premature ventricular contractions    LBBB inferior axis PVCs  . Tobacco abuse   . Ulcer (HCC)    duodenal   Past Surgical History:  Procedure Laterality Date  . CARDIAC CATHETERIZATION N/A 12/03/2015   Procedure: Left Heart Cath and Coronary Angiography;  Surgeon: Christopher D McAlhany, MD;  Location: [facility];   Service: Cardiovascular;  Laterality: N/A;  . CARPAL TUNNEL RELEASE Left    1997 left,   . CHOLECYSTECTOMY N/A 03/23/2015   Procedure: LAPAROSCOPIC CHOLECYSTECTOMY;  Surgeon: Armando Ramirez, MD;  Location: [facility] OR;  Service: General;  Laterality: N/A;  . COLONOSCOPY  10-17-13   per Dr. Brodie, benign polyps, repeat in 10 years  . EUS N/A 11/12/2014   Procedure: UPPER ENDOSCOPIC ULTRASOUND (EUS) LINEAR;  Surgeon: Daniel P Jacobs, MD;  Location: [facility];  Service: Endoscopy;  Laterality: N/A;  . LEFT AND RIGHT HEART CATHETERIZATION WITH CORONARY ANGIOGRAM N/A 04/22/2014   Procedure: LEFT AND RIGHT HEART CATHETERIZATION WITH CORONARY ANGIOGRAM;  Surgeon: Christopher D McAlhany, MD;  Location: [facility];  Service: Cardiovascular;  Laterality: N/A;  . TONSILLECTOMY    . torn cartilage repaired rt wrist Right 2003     Current Outpatient Prescriptions  Medication Sig Dispense Refill  . albuterol (PROVENTIL HFA;VENTOLIN HFA) 108 (90 Base) MCG/ACT inhaler Inhale 2 puffs into the lungs every 6 (six) hours as needed for wheezing. 1 Inhaler 6  . ALPRAZolam (XANAX) 0.25 MG tablet TAKE 3 TABLETS BY MOUTH EVERY 6 HOURS AS NEEDED 360 tablet 2  . aspirin EC 81 MG tablet Take 1 tablet (81 mg total) by mouth daily. 90 tablet 3  . atorvastatin (LIPITOR) 20 MG tablet Take 1 tablet (20 mg total) by mouth daily. 90 tablet 3  . buPROPion (WELLBUTRIN XL) 150 MG 24 hr tablet TAKE 1 TABLET (150 MG TOTAL)   BY MOUTH DAILY. 90 tablet 3  . cetirizine (ZYRTEC) 10 MG tablet Take 10 mg by mouth daily as needed for allergies.     . cyclobenzaprine (FLEXERIL) 10 MG tablet Take 1 tablet (10 mg total) by mouth 3 (three) times daily as needed for muscle spasms. 90 tablet 5  . flecainide (TAMBOCOR) 50 MG tablet Take 1 tablet (50 mg total) by mouth 3 (three) times daily. 270 tablet 2  . hydrochlorothiazide (HYDRODIURIL) 25 MG tablet TAKE 1 TABLET BY MOUTH EVERY DAY 90 tablet 0  . HYDROcodone-acetaminophen (NORCO) 10-325 MG  tablet Take 1 tablet by mouth every 6 (six) hours as needed for severe pain. 120 tablet 0  . ibuprofen (ADVIL,MOTRIN) 200 MG tablet Take 400 mg by mouth every 6 (six) hours as needed for moderate pain.     . lisinopril (PRINIVIL,ZESTRIL) 40 MG tablet Take 1 tablet (40 mg total) by mouth daily. 30 tablet 6  . meloxicam (MOBIC) 15 MG tablet Take 1 tablet (15 mg total) by mouth daily. 30 tablet 11  . metoprolol (LOPRESSOR) 50 MG tablet TAKE 1 TABLET (50 MG TOTAL) BY MOUTH 2 (TWO) TIMES DAILY. 180 tablet 3  . nitroGLYCERIN (NITROSTAT) 0.4 MG SL tablet Place 0.4 mg under the tongue every 5 (five) minutes as needed for chest pain (MAX 3 TABLETS). Reported on 07/02/2015    . pantoprazole (PROTONIX) 40 MG tablet TAKE 1 TABLET (40 MG TOTAL) BY MOUTH 2 (TWO) TIMES DAILY. 60 tablet 3  . prochlorperazine (COMPAZINE) 10 MG tablet Take 1 tablet (10 mg total) by mouth every 6 (six) hours as needed for nausea or vomiting. 60 tablet 2  . SYMBICORT 160-4.5 MCG/ACT inhaler Inhale 2 puffs into the lungs 2 (two) times daily. Reported on 07/02/2015  6   No current facility-administered medications for this visit.     Allergies:   Dexamethasone; Augmentin [amoxicillin-pot clavulanate]; Clindamycin/lincomycin; and Oxycodone-acetaminophen   Social History:  The patient  reports that she has been smoking Cigarettes.  She has a 24.30 pack-year smoking history. She has never used smokeless tobacco. She reports that she does not drink alcohol or use drugs.   Family History:  The patient's  family history includes CAD in her brother; Emphysema in her mother; Kidney disease in her brother; Other in her father; Throat cancer in her brother.    ROS:  Please see the history of present illness.   All other systems are reviewed and negative.    PHYSICAL EXAM: VS:  BP (!) 142/100 (BP Location: Right Arm, Patient Position: Sitting, Cuff Size: Normal)   Pulse (!) 58   Ht 5' 1" (1.549 m)   Wt 171 lb 6.4 oz (77.7 kg)   SpO2 93%    BMI 32.39 kg/m  , BMI Body mass index is 32.39 kg/m. GEN: Well nourished, well developed, in no acute distress, smells heavily of tobacco HEENT: normal, poor dentition,  Neck: no JVD, carotid bruits, or masses Cardiac: RRR no murmurs, rubs, or gallops,no edema  Respiratory:  clear to auscultation bilaterally, normal work of breathing GI: soft, nontender, nondistended, + BS MS: no deformity or atrophy  Skin: warm and dry  Neuro:  Strength and sensation are intact Psych: euthymic mood, full affect  EKG:  EKG is ordered today. The ekg ordered today shows sinus brady, normal intervals     Recent Labs: 11/25/2015: BUN 15; Creat 1.12; Hemoglobin 14.9; Platelets 299; Potassium 3.9; Sodium 132 12/30/2015: Magnesium 1.9; TSH 1.67 03/22/2016: ALT 7    Lipid   Panel     Component Value Date/Time   CHOL 144 03/22/2016 1027   TRIG 157.0 (H) 03/22/2016 1027   HDL 58.00 03/22/2016 1027   CHOLHDL 2 03/22/2016 1027   VLDL 31.4 03/22/2016 1027   LDLCALC 54 03/22/2016 1027   LDLDIRECT 130.9 11/16/2008 1013     Wt Readings from Last 3 Encounters:  05/24/16 171 lb 6.4 oz (77.7 kg)  03/22/16 168 lb (76.2 kg)  03/09/16 170 lb (77.1 kg)      ASSESSMENT AND PLAN:  1.  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  She is quite symptomatic with these. Unfortunately, they have been too infrequent for ablation.   Continue flecainide 50mg TID for now  2. Tobacco abuse  Cessation encouraged today  3. HTN Stable No change required today  4. Fatigue/daytime somnolence We have again discussed sleep study She is unable to go to a facility as she works 3rd shift Will order home sleep study   Current medicines are reviewed at length with the patient today.   The patient does not have concerns regarding her medicines.  The following changes were made today:  none  Follow-up with me in 6 months, Dr McAlhany as scheduled   Signed, Markeis Allman, NP  05/24/2016 2:43 PM      [facility] [address] [place] [address] [phone] (office) [phone] (fax) 

## 2016-05-24 ENCOUNTER — Ambulatory Visit (INDEPENDENT_AMBULATORY_CARE_PROVIDER_SITE_OTHER): Payer: Medicare Other | Admitting: Nurse Practitioner

## 2016-05-24 ENCOUNTER — Encounter: Payer: Self-pay | Admitting: Nurse Practitioner

## 2016-05-24 ENCOUNTER — Encounter (INDEPENDENT_AMBULATORY_CARE_PROVIDER_SITE_OTHER): Payer: Self-pay

## 2016-05-24 ENCOUNTER — Ambulatory Visit: Payer: Medicare Other | Admitting: Nurse Practitioner

## 2016-05-24 VITALS — BP 142/100 | HR 58 | Ht 61.0 in | Wt 171.4 lb

## 2016-05-24 DIAGNOSIS — I10 Essential (primary) hypertension: Secondary | ICD-10-CM

## 2016-05-24 DIAGNOSIS — R400 Somnolence: Secondary | ICD-10-CM | POA: Diagnosis not present

## 2016-05-24 DIAGNOSIS — Z720 Tobacco use: Secondary | ICD-10-CM | POA: Diagnosis not present

## 2016-05-24 DIAGNOSIS — I493 Ventricular premature depolarization: Secondary | ICD-10-CM

## 2016-05-24 LAB — BASIC METABOLIC PANEL
BUN / CREAT RATIO: 15 (ref 12–28)
BUN: 16 mg/dL (ref 8–27)
CO2: 25 mmol/L (ref 18–29)
CREATININE: 1.07 mg/dL — AB (ref 0.57–1.00)
Calcium: 9.2 mg/dL (ref 8.7–10.3)
Chloride: 90 mmol/L — ABNORMAL LOW (ref 96–106)
GFR calc Af Amer: 62 (ref >59)
GFR, EST NON AFRICAN AMERICAN: 54 — AB (ref >59)
Glucose: 94 mg/dL (ref 65–99)
Potassium: 4.3 mmol/L (ref 3.5–5.2)
SODIUM: 133 mmol/L — AB (ref 134–144)

## 2016-05-24 MED ORDER — LISINOPRIL 40 MG PO TABS: 40.0000 mg | ORAL_TABLET | Freq: Every day | ORAL | 6 refills | Status: DC

## 2016-05-24 NOTE — Patient Instructions (Addendum)
Medication Instructions:   START TAKING LISINOPRIL 40 MG ONCE A DAY   If you need a refill on your cardiac medications before your next appointment, please call your pharmacy.  Labwork: BMET TODAY    Testing/Procedures: Your physician has recommended that you have a sleep study. This test records several body functions during sleep, including: brain activity, eye movement, oxygen and carbon dioxide blood levels, heart rate and rhythm, breathing rate and rhythm, the flow of air through your mouth and nose, snoring, body muscle movements, and chest and belly movement. SOME ONE WILL CONTACT YOUR WITH MORE INFORMATION     Follow-Up:  Your physician wants you to follow-up in:  IN  [place] will receive a reminder letter in the mail two months in advance. If you don't receive a letter, please call our office to schedule the follow-up appointment.  \    Any Other Special Instructions Will Be Listed Below (If Applicable).

## 2016-05-25 NOTE — Progress Notes (Deleted)
[name] [name] was seen today in the movement disorders clinic for neurologic consultation at the request of Alysia Penna, MD.  The consultation is for the evaluation of tremor.  The first symptom(s) the patient noticed was {parkinsons general sx:18033} and this was {NUMBERS;0-15 BY 1:408015} {days/wks/mos/yrs:310907}.     Tremor: {yes no:314532}   How long has it been going on? ***  At rest or with activation?  ***  When is it noted the most?  ***  Fam hx of tremor?  {yes NH:2228965  Located where?  ***  Affected by caffeine:  {yes no:314532}  Affected by alcohol:  {yes no:314532}  Affected by stress:  {yes no:314532}  Affected by fatigue:  {yes no:314532}  Spills soup if on spoon:  {yes no:314532}  Spills glass of liquid if full:  {yes no:314532}  Affects ADL's (tying shoes, brushing teeth, etc):  {yes no:314532}  Specific Symptoms:  Family hx of similar:  {yes no:314532} Voice: *** Sleep: ***  Vivid Dreams:  {yes no:314532}  Acting out dreams:  {yes no:314532} Wet Pillows: {yes no:314532} Postural symptoms:  {yes no:314532}  Falls?  {yes no:314532} Bradykinesia symptoms: {parkinson brady:18041} Loss of smell:  {yes no:314532} Loss of taste:  {yes no:314532} Urinary Incontinence:  {yes no:314532} Difficulty Swallowing:  {yes no:314532} Handwriting, micrographia: {yes no:314532} Trouble with ADL's:  {yes no:314532}  Trouble buttoning clothing: {yes no:314532} Depression:  {yes no:314532} Memory changes:  {yes no:314532} Hallucinations:  {yes no:314532}  visual distortions: {yes no:314532} N/V:  {yes no:314532} Lightheaded:  {yes no:314532}  Syncope: {yes no:314532} Diplopia:  {yes no:314532} Dyskinesia:  {yes no:314532}  Neuroimaging has *** previously been performed.  It *** available for my review today.  PREVIOUS MEDICATIONS: {Parkinson's RX:18200} on xanax - 0.25 mg and takes 12 per day  ALLERGIES:   Allergies  Allergen Reactions  . Dexamethasone Other  (See Comments)    Pt thinks they used it during surgery and she quit breathing, not sure this was the exact medication.  . Augmentin [Amoxicillin-Pot Clavulanate] Nausea And Vomiting  . Clindamycin/Lincomycin Nausea And Vomiting  . Oxycodone-Acetaminophen Nausea And Vomiting    CURRENT MEDICATIONS:  Outpatient Encounter Prescriptions as of 05/30/2016  Medication Sig  . albuterol (PROVENTIL HFA;VENTOLIN HFA) 108 (90 Base) MCG/ACT inhaler Inhale 2 puffs into the lungs every 6 (six) hours as needed for wheezing.  Marland Kitchen ALPRAZolam (XANAX) 0.25 MG tablet TAKE 3 TABLETS BY MOUTH EVERY 6 HOURS AS NEEDED  . aspirin EC 81 MG tablet Take 1 tablet (81 mg total) by mouth daily.  Marland Kitchen atorvastatin (LIPITOR) 20 MG tablet Take 1 tablet (20 mg total) by mouth daily.  Marland Kitchen buPROPion (WELLBUTRIN XL) 150 MG 24 hr tablet TAKE 1 TABLET (150 MG TOTAL) BY MOUTH DAILY.  . cetirizine (ZYRTEC) 10 MG tablet Take 10 mg by mouth daily as needed for allergies.   . cyclobenzaprine (FLEXERIL) 10 MG tablet Take 1 tablet (10 mg total) by mouth 3 (three) times daily as needed for muscle spasms.  . flecainide (TAMBOCOR) 50 MG tablet Take 1 tablet (50 mg total) by mouth 3 (three) times daily.  . hydrochlorothiazide (HYDRODIURIL) 25 MG tablet TAKE 1 TABLET BY MOUTH EVERY DAY  . HYDROcodone-acetaminophen (NORCO) 10-325 MG tablet Take 1 tablet by mouth every 6 (six) hours as needed for severe pain.  Marland Kitchen ibuprofen (ADVIL,MOTRIN) 200 MG tablet Take 400 mg by mouth every 6 (six) hours as needed for moderate pain.   Marland Kitchen lisinopril (PRINIVIL,ZESTRIL) 40 MG tablet Take 1 tablet (40  mg total) by mouth daily.  . meloxicam (MOBIC) 15 MG tablet Take 1 tablet (15 mg total) by mouth daily.  . metoprolol (LOPRESSOR) 50 MG tablet TAKE 1 TABLET (50 MG TOTAL) BY MOUTH 2 (TWO) TIMES DAILY.  . nitroGLYCERIN (NITROSTAT) 0.4 MG SL tablet Place 0.4 mg under the tongue every 5 (five) minutes as needed for chest pain (MAX 3 TABLETS). Reported on 07/02/2015  .  pantoprazole (PROTONIX) 40 MG tablet TAKE 1 TABLET (40 MG TOTAL) BY MOUTH 2 (TWO) TIMES DAILY.  Marland Kitchen prochlorperazine (COMPAZINE) 10 MG tablet Take 1 tablet (10 mg total) by mouth every 6 (six) hours as needed for nausea or vomiting.  . SYMBICORT 160-4.5 MCG/ACT inhaler Inhale 2 puffs into the lungs 2 (two) times daily. Reported on 07/02/2015   No facility-administered encounter medications on file as of 05/30/2016.     PAST MEDICAL HISTORY:   Past Medical History:  Diagnosis Date  . Allergic rhinitis   . Allergy    seasonal  . Anxiety   . Cataract   . Complication of anesthesia 2003   Medication for block "went up to my brain and I stopped breathing"  . COPD (chronic obstructive pulmonary disease) ([place])   . Depression   . Dexamethasone adverse reaction    2002 normal  . GERD (gastroesophageal reflux disease)   . Hyperlipidemia   . Hypertension   . Insomnia   . Osteoarthritis   . Overactive bladder   . Premature ventricular contractions    LBBB inferior axis PVCs  . Tobacco abuse   . Ulcer ([place])    duodenal    PAST SURGICAL HISTORY:   Past Surgical History:  Procedure Laterality Date  . CARDIAC CATHETERIZATION N/A 12/03/2015   Procedure: Left Heart Cath and Coronary Angiography;  Surgeon: Burnell Blanks, MD;  Location: [place] [facility];  Service: Cardiovascular;  Laterality: N/A;  . CARPAL TUNNEL RELEASE Left    1997 left,   . CHOLECYSTECTOMY N/A 03/23/2015   Procedure: LAPAROSCOPIC CHOLECYSTECTOMY;  Surgeon: Ralene Ok, MD;  Location: [place];  Service: General;  Laterality: N/A;  . COLONOSCOPY  10-17-13   per Dr. Olevia Perches, benign polyps, repeat in 10 years  . EUS N/A 11/12/2014   Procedure: UPPER ENDOSCOPIC ULTRASOUND (EUS) LINEAR;  Surgeon: Milus Banister, MD;  Location: [facility];  Service: Endoscopy;  Laterality: N/A;  . LEFT AND RIGHT HEART CATHETERIZATION WITH CORONARY ANGIOGRAM N/A 04/22/2014   Procedure: LEFT AND RIGHT HEART CATHETERIZATION WITH CORONARY  ANGIOGRAM;  Surgeon: Burnell Blanks, MD;  Location: [facility]ATH LAB;  Service: Cardiovascular;  Laterality: N/A;  . TONSILLECTOMY    . torn cartilage repaired rt wrist Right 2003    SOCIAL HISTORY:   Social History   Social History  . Marital status: Single    Spouse name: N/A  . Number of children: 0  . Years of education: N/A   Occupational History  . CNA    Social History Main Topics  . Smoking status: Current Every Day Smoker    Packs/day: 0.45    Years: 54.00    Types: Cigarettes  . Smokeless tobacco: Never Used     Comment: 1/2 pack per day or less  . Alcohol use No  . Drug use: No  . Sexual activity: Not on file   Other Topics Concern  . Not on file   Social History Narrative   Works as Quarry manager at [employer].    FAMILY HISTORY:   Family Status  Relation Status  . Brother Deceased  . Mother Deceased  . Father Deceased  . Brother Alive  . Sister Deceased  .    Marland Kitchen Neg Hx     ROS:  A complete 10 system review of systems was obtained and was unremarkable apart from what is mentioned above.  PHYSICAL EXAMINATION:    VITALS:  There were no vitals filed for this visit.  GEN:  The patient appears stated age and is in NAD. HEENT:  Normocephalic, atraumatic.  The mucous membranes are moist. The superficial temporal arteries are without ropiness or tenderness. CV:  RRR Lungs:  CTAB Neck/HEME:  There are no carotid bruits bilaterally.  Neurological examination:  Orientation: The patient is alert and oriented x3. Fund of knowledge is appropriate.  Recent and remote memory are intact.  Attention and concentration are normal.    Able to name objects and repeat phrases. Cranial nerves: There is good facial symmetry. Pupils are equal round and reactive to light bilaterally. Fundoscopic exam reveals clear margins bilaterally. Extraocular muscles are intact. The visual fields are full to confrontational testing. The speech is fluent and clear. Soft palate rises  symmetrically and there is no tongue deviation. Hearing is intact to conversational tone. Sensation: Sensation is intact to light and pinprick throughout (facial, trunk, extremities). Vibration is intact at the bilateral big toe. There is no extinction with double simultaneous stimulation. There is no sensory dermatomal level identified. Motor: Strength is 5/5 in the bilateral upper and lower extremities.   Shoulder shrug is equal and symmetric.  There is no pronator drift. Deep tendon reflexes: Deep tendon reflexes are 2/4 at the bilateral biceps, triceps, brachioradialis, patella and achilles. Plantar responses are downgoing bilaterally.  Movement examination: Tone: There is ***tone in the bilateral upper extremities.  The tone in the lower extremities is ***.  Abnormal movements: *** Coordination:  There is *** decremation with RAM's, *** Gait and Station: The patient has *** difficulty arising out of a deep-seated chair without the use of the hands. The patient's stride length is ***.  The patient has a *** pull test.      ASSESSMENT/PLAN:  ***  Cc:  Alysia Penna, MD

## 2016-05-30 ENCOUNTER — Ambulatory Visit: Payer: Medicare Other | Admitting: Neurology

## 2016-05-30 ENCOUNTER — Telehealth: Payer: Self-pay | Admitting: *Deleted

## 2016-05-30 NOTE — Telephone Encounter (Signed)
Home study ordered ,called pt. Left message to call back

## 2016-06-07 ENCOUNTER — Encounter (HOSPITAL_COMMUNITY): Payer: Self-pay | Admitting: *Deleted

## 2016-06-08 ENCOUNTER — Encounter (HOSPITAL_COMMUNITY)
Admission: RE | Disposition: A | Discharge status: Home / Self Care | Payer: Self-pay | Source: Ambulatory Visit | Attending: Gastroenterology

## 2016-06-08 ENCOUNTER — Encounter (HOSPITAL_COMMUNITY): Payer: Self-pay | Admitting: Gastroenterology

## 2016-06-08 ENCOUNTER — Ambulatory Visit (HOSPITAL_COMMUNITY): Payer: Medicare Other | Admitting: Certified Registered Nurse Anesthetist

## 2016-06-08 ENCOUNTER — Ambulatory Visit (HOSPITAL_COMMUNITY)
Admission: RE | Admit: 2016-06-08 | Discharge: 2016-06-08 | Disposition: A | Discharge status: Home / Self Care | Payer: Medicare Other | Source: Ambulatory Visit | Attending: Gastroenterology | Admitting: Gastroenterology

## 2016-06-08 DIAGNOSIS — F329 Major depressive disorder, single episode, unspecified: Secondary | ICD-10-CM | POA: Insufficient documentation

## 2016-06-08 DIAGNOSIS — Z9049 Acquired absence of other specified parts of digestive tract: Secondary | ICD-10-CM | POA: Diagnosis not present

## 2016-06-08 DIAGNOSIS — N3281 Overactive bladder: Secondary | ICD-10-CM | POA: Insufficient documentation

## 2016-06-08 DIAGNOSIS — G4700 Insomnia, unspecified: Secondary | ICD-10-CM | POA: Diagnosis not present

## 2016-06-08 DIAGNOSIS — Z09 Encounter for follow-up examination after completed treatment for conditions other than malignant neoplasm: Secondary | ICD-10-CM | POA: Diagnosis not present

## 2016-06-08 DIAGNOSIS — Z800 Family history of malignant neoplasm of digestive organs: Secondary | ICD-10-CM | POA: Diagnosis not present

## 2016-06-08 DIAGNOSIS — Z8711 Personal history of peptic ulcer disease: Secondary | ICD-10-CM | POA: Insufficient documentation

## 2016-06-08 DIAGNOSIS — Z8249 Family history of ischemic heart disease and other diseases of the circulatory system: Secondary | ICD-10-CM | POA: Insufficient documentation

## 2016-06-08 DIAGNOSIS — J449 Chronic obstructive pulmonary disease, unspecified: Secondary | ICD-10-CM | POA: Diagnosis not present

## 2016-06-08 DIAGNOSIS — K3189 Other diseases of stomach and duodenum: Secondary | ICD-10-CM

## 2016-06-08 DIAGNOSIS — Z825 Family history of asthma and other chronic lower respiratory diseases: Secondary | ICD-10-CM | POA: Insufficient documentation

## 2016-06-08 DIAGNOSIS — Z880 Allergy status to penicillin: Secondary | ICD-10-CM | POA: Insufficient documentation

## 2016-06-08 DIAGNOSIS — J309 Allergic rhinitis, unspecified: Secondary | ICD-10-CM | POA: Insufficient documentation

## 2016-06-08 DIAGNOSIS — Z79899 Other long term (current) drug therapy: Secondary | ICD-10-CM | POA: Insufficient documentation

## 2016-06-08 DIAGNOSIS — I10 Essential (primary) hypertension: Secondary | ICD-10-CM | POA: Diagnosis not present

## 2016-06-08 DIAGNOSIS — E785 Hyperlipidemia, unspecified: Secondary | ICD-10-CM | POA: Diagnosis not present

## 2016-06-08 DIAGNOSIS — I493 Ventricular premature depolarization: Secondary | ICD-10-CM | POA: Insufficient documentation

## 2016-06-08 DIAGNOSIS — K219 Gastro-esophageal reflux disease without esophagitis: Secondary | ICD-10-CM | POA: Diagnosis not present

## 2016-06-08 DIAGNOSIS — Z888 Allergy status to other drugs, medicaments and biological substances status: Secondary | ICD-10-CM | POA: Insufficient documentation

## 2016-06-08 DIAGNOSIS — I447 Left bundle-branch block, unspecified: Secondary | ICD-10-CM | POA: Insufficient documentation

## 2016-06-08 DIAGNOSIS — Z7982 Long term (current) use of aspirin: Secondary | ICD-10-CM | POA: Insufficient documentation

## 2016-06-08 DIAGNOSIS — Z885 Allergy status to narcotic agent status: Secondary | ICD-10-CM | POA: Diagnosis not present

## 2016-06-08 DIAGNOSIS — F419 Anxiety disorder, unspecified: Secondary | ICD-10-CM | POA: Insufficient documentation

## 2016-06-08 DIAGNOSIS — F17210 Nicotine dependence, cigarettes, uncomplicated: Secondary | ICD-10-CM | POA: Diagnosis not present

## 2016-06-08 DIAGNOSIS — Z8719 Personal history of other diseases of the digestive system: Secondary | ICD-10-CM | POA: Diagnosis not present

## 2016-06-08 DIAGNOSIS — Z881 Allergy status to other antibiotic agents status: Secondary | ICD-10-CM | POA: Diagnosis not present

## 2016-06-08 DIAGNOSIS — Z841 Family history of disorders of kidney and ureter: Secondary | ICD-10-CM | POA: Insufficient documentation

## 2016-06-08 DIAGNOSIS — M1990 Unspecified osteoarthritis, unspecified site: Secondary | ICD-10-CM | POA: Insufficient documentation

## 2016-06-08 DIAGNOSIS — Z6832 Body mass index (BMI) 32.0-32.9, adult: Secondary | ICD-10-CM | POA: Insufficient documentation

## 2016-06-08 DIAGNOSIS — I2510 Atherosclerotic heart disease of native coronary artery without angina pectoris: Secondary | ICD-10-CM | POA: Insufficient documentation

## 2016-06-08 HISTORY — PX: EUS: SHX5427

## 2016-06-08 HISTORY — DX: Pneumonia, unspecified organism: J18.9

## 2016-06-08 SURGERY — UPPER ENDOSCOPIC ULTRASOUND (EUS) RADIAL
Anesthesia: Monitor Anesthesia Care

## 2016-06-08 MED ORDER — ONDANSETRON HCL 4 MG/2ML IJ SOLN
INTRAMUSCULAR | Status: DC | PRN
  Administered 2016-06-08: 4 mg via INTRAVENOUS

## 2016-06-08 MED ORDER — PROPOFOL 10 MG/ML IV BOLUS
INTRAVENOUS | Status: AC
  Filled 2016-06-08: qty 60

## 2016-06-08 MED ORDER — PROPOFOL 500 MG/50ML IV EMUL
INTRAVENOUS | Status: DC | PRN
  Administered 2016-06-08: 100 ug/kg/min via INTRAVENOUS

## 2016-06-08 MED ORDER — LIDOCAINE 2% (20 MG/ML) 5 ML SYRINGE
INTRAMUSCULAR | Status: AC
  Filled 2016-06-08: qty 5

## 2016-06-08 MED ORDER — ONDANSETRON HCL 4 MG/2ML IJ SOLN
INTRAMUSCULAR | Status: AC
  Filled 2016-06-08: qty 2

## 2016-06-08 MED ORDER — PROPOFOL 10 MG/ML IV BOLUS
INTRAVENOUS | Status: DC | PRN
  Administered 2016-06-08: 20 mg via INTRAVENOUS
  Administered 2016-06-08: 30 mg via INTRAVENOUS

## 2016-06-08 MED ORDER — LIDOCAINE 2% (20 MG/ML) 5 ML SYRINGE
INTRAMUSCULAR | Status: DC | PRN
  Administered 2016-06-08: 100 mg via INTRAVENOUS

## 2016-06-08 MED ORDER — LACTATED RINGERS IV SOLN
INTRAVENOUS | Status: DC
  Administered 2016-06-08: 08:00:00 via INTRAVENOUS

## 2016-06-08 NOTE — Anesthesia Preprocedure Evaluation (Signed)
Anesthesia Evaluation  Patient identified by MRN, date of birth, ID band Patient awake    Reviewed: Allergy & Precautions, NPO status , Patient's Chart, lab work & pertinent test results  History of Anesthesia Complications Negative for: history of anesthetic complications  Airway Mallampati: II  TM Distance: >3 FB Neck ROM: Full    Dental  (+) Partial Lower, Partial Upper, Missing, Chipped, Dental Advisory Given   Pulmonary COPD,  COPD inhaler, Current Smoker,    breath sounds clear to auscultation       Cardiovascular hypertension, Pt. on medications and Pt. on home beta blockers (-) angina+ CAD  + dysrhythmias  Rhythm:Regular Rate:Normal  '15 ECHO: EF 60-65%, valves OK   Neuro/Psych Anxiety Depression negative neurological ROS     GI/Hepatic Neg liver ROS, GERD  Medicated and Controlled,  Endo/Other  Morbid obesity  Renal/GU negative Renal ROS     Musculoskeletal  (+) Arthritis , Osteoarthritis,    Abdominal (+) + obese,   Peds  Hematology negative hematology ROS (+)   Anesthesia Other Findings   Reproductive/Obstetrics                             Anesthesia Physical  Anesthesia Plan  ASA: III  Anesthesia Plan: MAC   Post-op Pain Management:    Induction: Intravenous  Airway Management Planned: Simple Face Mask  Additional Equipment:   Intra-op Plan:   Post-operative Plan: Extubation in OR  Informed Consent: I have reviewed the patients History and Physical, chart, labs and discussed the procedure including the risks, benefits and alternatives for the proposed anesthesia with the patient or authorized representative who has indicated his/her understanding and acceptance.   Dental advisory given  Plan Discussed with: CRNA and Surgeon  Anesthesia Plan Comments:         Anesthesia Quick Evaluation

## 2016-06-08 NOTE — H&P (View-Only) (Signed)
Electrophysiology Office Note   Date:  05/24/2016   ID:  Shalee, Paolo [birth year], MRN [id number]  PCP:  Alysia Penna, MD  Cardiologist:  Dr Angelena Form Primary Electrophysiologist: [place]    History of Present Illness: [name] [name] is a 66 y.o. female seen today for Dr Rayann Heman. She presents today for routine electrophysiology follow-up of PVC's.  She was intolerant of Inderal and Metoprolol. She has been maintained on low dose Flecainide as she was intolerant of higher doses. At last office visit, Dr Rayann Heman offered trial of Propafenone, but she did not wish to take at that time.  Since last EP visit, she underwent LHC which demonstrated continued non-obstructive CAD.  Her palpitations have been stable. Her biggest concern today is of fatigue. She previously had sleep study ordered, but as she works 3rd shift did not complete.   Today, she denies symptoms of chest pain, orthopnea, PND, lower extremity edema, claudication, dizziness, presyncope, syncope, bleeding, or neurologic sequela. The patient is tolerating medications without difficulties and is otherwise without complaint today.    Past Medical History:  Diagnosis Date  . Allergic rhinitis   . Allergy    seasonal  . Anxiety   . Cataract   . Complication of anesthesia 2003   Medication for block "went up to my brain and I stopped breathing"  . COPD (chronic obstructive pulmonary disease) ([place])   . Depression   . Dexamethasone adverse reaction    2002 normal  . GERD (gastroesophageal reflux disease)   . Hyperlipidemia   . Hypertension   . Insomnia   . Osteoarthritis   . Overactive bladder   . Premature ventricular contractions    LBBB inferior axis PVCs  . Tobacco abuse   . Ulcer ([place])    duodenal   Past Surgical History:  Procedure Laterality Date  . CARDIAC CATHETERIZATION N/A 12/03/2015   Procedure: Left Heart Cath and Coronary Angiography;  Surgeon: Burnell Blanks, MD;  Location: [facility];   Service: Cardiovascular;  Laterality: N/A;  . CARPAL TUNNEL RELEASE Left    1997 left,   . CHOLECYSTECTOMY N/A 03/23/2015   Procedure: LAPAROSCOPIC CHOLECYSTECTOMY;  Surgeon: Ralene Ok, MD;  Location: [facility];  Service: General;  Laterality: N/A;  . COLONOSCOPY  10-17-13   per Dr. Olevia Perches, benign polyps, repeat in 10 years  . EUS N/A 11/12/2014   Procedure: UPPER ENDOSCOPIC ULTRASOUND (EUS) LINEAR;  Surgeon: Milus Banister, MD;  Location: [facility];  Service: Endoscopy;  Laterality: N/A;  . LEFT AND RIGHT HEART CATHETERIZATION WITH CORONARY ANGIOGRAM N/A 04/22/2014   Procedure: LEFT AND RIGHT HEART CATHETERIZATION WITH CORONARY ANGIOGRAM;  Surgeon: Burnell Blanks, MD;  [facility]: [facility];  Service: Cardiovascular;  Laterality: N/A;  . TONSILLECTOMY    . torn cartilage repaired rt wrist Right 2003     Current Outpatient Prescriptions  Medication Sig Dispense Refill  . albuterol (PROVENTIL HFA;VENTOLIN HFA) 108 (90 Base) MCG/ACT inhaler Inhale 2 puffs into the lungs every 6 (six) hours as needed for wheezing. 1 Inhaler 6  . ALPRAZolam (XANAX) 0.25 MG tablet TAKE 3 TABLETS BY MOUTH EVERY 6 HOURS AS NEEDED 360 tablet 2  . aspirin EC 81 MG tablet Take 1 tablet (81 mg total) by mouth daily. 90 tablet 3  . atorvastatin (LIPITOR) 20 MG tablet Take 1 tablet (20 mg total) by mouth daily. 90 tablet 3  . buPROPion (WELLBUTRIN XL) 150 MG 24 hr tablet TAKE 1 TABLET (150 MG TOTAL)  BY MOUTH DAILY. 90 tablet 3  . cetirizine (ZYRTEC) 10 MG tablet Take 10 mg by mouth daily as needed for allergies.     . cyclobenzaprine (FLEXERIL) 10 MG tablet Take 1 tablet (10 mg total) by mouth 3 (three) times daily as needed for muscle spasms. 90 tablet 5  . flecainide (TAMBOCOR) 50 MG tablet Take 1 tablet (50 mg total) by mouth 3 (three) times daily. 270 tablet 2  . hydrochlorothiazide (HYDRODIURIL) 25 MG tablet TAKE 1 TABLET BY MOUTH EVERY DAY 90 tablet 0  . HYDROcodone-acetaminophen (NORCO) 10-325 MG  tablet Take 1 tablet by mouth every 6 (six) hours as needed for severe pain. 120 tablet 0  . ibuprofen (ADVIL,MOTRIN) 200 MG tablet Take 400 mg by mouth every 6 (six) hours as needed for moderate pain.     Marland Kitchen lisinopril (PRINIVIL,ZESTRIL) 40 MG tablet Take 1 tablet (40 mg total) by mouth daily. 30 tablet 6  . meloxicam (MOBIC) 15 MG tablet Take 1 tablet (15 mg total) by mouth daily. 30 tablet 11  . metoprolol (LOPRESSOR) 50 MG tablet TAKE 1 TABLET (50 MG TOTAL) BY MOUTH 2 (TWO) TIMES DAILY. 180 tablet 3  . nitroGLYCERIN (NITROSTAT) 0.4 MG SL tablet Place 0.4 mg under the tongue every 5 (five) minutes as needed for chest pain (MAX 3 TABLETS). Reported on 07/02/2015    . pantoprazole (PROTONIX) 40 MG tablet TAKE 1 TABLET (40 MG TOTAL) BY MOUTH 2 (TWO) TIMES DAILY. 60 tablet 3  . prochlorperazine (COMPAZINE) 10 MG tablet Take 1 tablet (10 mg total) by mouth every 6 (six) hours as needed for nausea or vomiting. 60 tablet 2  . SYMBICORT 160-4.5 MCG/ACT inhaler Inhale 2 puffs into the lungs 2 (two) times daily. Reported on 07/02/2015  6   No current facility-administered medications for this visit.     Allergies:   Dexamethasone; Augmentin [amoxicillin-pot clavulanate]; Clindamycin/lincomycin; and Oxycodone-acetaminophen   Social History:  The patient  reports that she has been smoking Cigarettes.  She has a 24.30 pack-year smoking history. She has never used smokeless tobacco. She reports that she does not drink alcohol or use drugs.   Family History:  The patient's  family history includes CAD in her brother; Emphysema in her mother; Kidney disease in her brother; Other in her father; Throat cancer in her brother.    ROS:  Please see the history of present illness.   All other systems are reviewed and negative.    PHYSICAL EXAM: VS:  BP (!) 142/100 (BP Location: Right Arm, Patient Position: Sitting, Cuff Size: Normal)   Pulse (!) 58   Ht 5\' 1"  (1.549 m)   Wt 171 lb 6.4 oz (77.7 kg)   SpO2 93%    BMI 32.39 kg/m  , BMI Body mass index is 32.39 kg/m. GEN: Well nourished, well developed, in no acute distress, smells heavily of tobacco HEENT: normal, poor dentition,  Neck: no JVD, carotid bruits, or masses Cardiac: RRR no murmurs, rubs, or gallops,no edema  Respiratory:  clear to auscultation bilaterally, normal work of breathing GI: soft, nontender, nondistended, + BS MS: no deformity or atrophy  Skin: warm and dry  Neuro:  Strength and sensation are intact Psych: euthymic mood, full affect  EKG:  EKG is ordered today. The ekg ordered today shows sinus brady, normal intervals     Recent Labs: 11/25/2015: BUN 15; Creat 1.12; Hemoglobin 14.9; Platelets 299; Potassium 3.9; Sodium 132 12/30/2015: Magnesium 1.9; TSH 1.67 03/22/2016: ALT 7    Lipid  Panel     Component Value Date/Time   CHOL 144 03/22/2016 1027   TRIG 157.0 (H) 03/22/2016 1027   HDL 58.00 03/22/2016 1027   CHOLHDL 2 03/22/2016 1027   VLDL 31.4 03/22/2016 1027   LDLCALC 54 03/22/2016 1027   LDLDIRECT 130.9 11/16/2008 1013     Wt Readings from Last 3 Encounters:  05/24/16 171 lb 6.4 oz (77.7 kg)  03/22/16 168 lb (76.2 kg)  03/09/16 170 lb (77.1 kg)      ASSESSMENT AND PLAN:  1.  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  She is quite symptomatic with these. Unfortunately, they have been too infrequent for ablation.   Continue flecainide 50mg  TID for now  2. Tobacco abuse  Cessation encouraged today  3. HTN Stable No change required today  4. Fatigue/daytime somnolence We have again discussed sleep study She is unable to go to a facility as she works 3rd shift Will order home sleep study   Current medicines are reviewed at length with the patient today.   The patient does not have concerns regarding her medicines.  The following changes were made today:  none  Follow-up with me in 6 months, Dr Angelena Form as scheduled   Signed, Chanetta Marshall, NP  05/24/2016 2:43 PM      [facility] [address] [place] [address] [phone] (office) [phone] (fax)

## 2016-06-08 NOTE — Progress Notes (Signed)
Patient states cardiology aware of increased blood pressure.  Recently increased her lisinopril dose.  Will call md if pressure remains high at home

## 2016-06-08 NOTE — Interval H&P Note (Signed)
History and Physical Interval Note:  06/08/2016 7:40 AM  [name] [name]  has presented today for surgery, with the diagnosis of abnormal duodenum  The various methods of treatment have been discussed with the patient and family. After consideration of risks, benefits and other options for treatment, the patient has consented to  Procedure(s): UPPER ENDOSCOPIC ULTRASOUND (EUS) RADIAL (N/A) as a surgical intervention .  The patient's history has been reviewed, patient examined, no change in status, stable for surgery.  I have reviewed the patient's chart and labs.  Questions were answered to the patient's satisfaction.     Milus Banister

## 2016-06-08 NOTE — Anesthesia Postprocedure Evaluation (Signed)
Anesthesia Post Note  Patient: [name] [name]  Procedure(s) Performed: Procedure(s) (LRB): UPPER ENDOSCOPIC ULTRASOUND (EUS) RADIAL (N/A)  Patient location during evaluation: PACU Anesthesia Type: MAC Level of consciousness: awake and alert Pain management: pain level controlled Vital Signs Assessment: post-procedure vital signs reviewed and stable Respiratory status: spontaneous breathing, nonlabored ventilation, respiratory function stable and patient connected to nasal cannula oxygen Cardiovascular status: stable and blood pressure returned to baseline Anesthetic complications: no       Last Vitals:  Vitals:   06/08/16 0910 06/08/16 0915  BP: (!) 189/97 (!) 202/98  Pulse: (!) 56 (!) 53  Resp: 15 12  Temp:      Last Pain:  Vitals:   06/08/16 0847  TempSrc: Oral                 Lynda Rainwater

## 2016-06-08 NOTE — Discharge Instructions (Signed)

## 2016-06-08 NOTE — H&P (Signed)
HPI: This is a 66 yo woman with abnormal duodenum  Chief complaint is abnormal duodenum  No symptoms  ROS: complete GI ROS as described in HPI.  Constitutional:  No unintentional weight loss   Past Medical History:  Diagnosis Date  . Allergic rhinitis   . Allergy    seasonal  . Anxiety   . Cataract    bilateral  . Complication of anesthesia 2003   Medication for block "went up to my brain and I stopped breathing"  . COPD (chronic obstructive pulmonary disease) ([place])   . Depression   . Dexamethasone adverse reaction    2002 normal  . GERD (gastroesophageal reflux disease)   . Hyperlipidemia   . Hypertension   . Insomnia   . Osteoarthritis   . Overactive bladder   . Pneumonia    once or twivce in past  . Premature ventricular contractions    LBBB inferior axis PVCs  . Tobacco abuse   . Ulcer ([place])    duodenal    Past Surgical History:  Procedure Laterality Date  . CARDIAC CATHETERIZATION N/A 12/03/2015   Procedure: Left Heart Cath and Coronary Angiography;  Surgeon: Burnell Blanks, MD;  Location: [place];  Service: Cardiovascular;  Laterality: N/A;  . CARPAL TUNNEL RELEASE Left    1997 left,    . CHOLECYSTECTOMY N/A 03/23/2015   Procedure: LAPAROSCOPIC CHOLECYSTECTOMY;  Surgeon: Ralene Ok, MD;  Location: [facility] [place];  Service: General;  Laterality: N/A;  . COLONOSCOPY  10-17-13   per Dr. Olevia Perches, benign polyps, repeat in 10 years  . EUS N/A 11/12/2014   Procedure: UPPER ENDOSCOPIC ULTRASOUND (EUS) LINEAR;  Surgeon: Milus Banister, MD;  Location: [facility];  Service: Endoscopy;  Laterality: N/A;  . LEFT AND RIGHT HEART CATHETERIZATION WITH CORONARY ANGIOGRAM N/A 04/22/2014   Procedure: LEFT AND RIGHT HEART CATHETERIZATION WITH CORONARY ANGIOGRAM;  Surgeon: Burnell Blanks, MD;  Location: [facility] CATH LAB;  Service: Cardiovascular;  Laterality: N/A;  . TONSILLECTOMY  1950  . torn cartilage repaired rt wrist Right 2003    Current  Facility-Administered Medications  Medication Dose Route Frequency Provider Last Rate Last Dose  . lactated ringers infusion   Intravenous Continuous Milus Banister, MD 20 mL/hr at 06/08/16 2536      Allergies as of 05/18/2016 - Review Complete 03/22/2016  Allergen Reaction Noted  . Dexamethasone Other (See Comments) 10/02/2013  . Augmentin [amoxicillin-pot clavulanate] Nausea And Vomiting 05/17/2015  . Clindamycin/lincomycin Nausea And Vomiting 11/06/2014  . Oxycodone-acetaminophen Nausea And Vomiting 05/08/2007    Family History  Problem Relation Age of Onset  . CAD Brother   . Kidney disease Brother   . Emphysema Mother   . Other Father     brain tumor  . Throat cancer Brother   . Hyperlipidemia    . Hypertension    . Kidney disease    . Heart disease    . Colon cancer Neg Hx   . Esophageal cancer Neg Hx   . Stomach cancer Neg Hx   . Rectal cancer Neg Hx     Social History   Social History  . Marital status: Single    Spouse name: N/A  . Number of children: 0  . Years of education: N/A   Occupational History  . CNA    Social History Main Topics  . Smoking status: Current Every Day Smoker    Packs/day: 0.45    Years: 54.00    Types: Cigarettes  . Smokeless  tobacco: Never Used     Comment: 1/2 pack per day or less  . Alcohol use No  . Drug use: No  . Sexual activity: Not on file   Other Topics Concern  . Not on file   Social History Narrative   Works as Quarry manager at [employer].     Physical Exam: BP (!) 158/92   Temp 98.2 F (36.8 C) (Oral)   Resp 15   SpO2 98%  Constitutional: generally well-appearing Psychiatric: alert and oriented x3 Abdomen: soft, nontender, nondistended, no obvious ascites, no peritoneal signs, normal bowel sounds No peripheral edema noted in lower extremities  Assessment and plan: 66 y.o. female with abnormal duodenum  Here for surveillance EUS (last 18 months ago) for abnormal duodenum  Please see the "Patient  Instructions" section for addition details about the plan.  Owens Loffler, MD [facility] 06/08/2016, 8:18 AM

## 2016-06-08 NOTE — Transfer of Care (Signed)
Immediate Anesthesia Transfer of Care Note  Patient: [name] [name]  Procedure(s) Performed: Procedure(s): UPPER ENDOSCOPIC ULTRASOUND (EUS) RADIAL (N/A)  Patient Location: ENDO  Anesthesia Type:MAC  Level of Consciousness:  sedated, patient cooperative and responds to stimulation  Airway & Oxygen Therapy:Patient Spontanous Breathing and Patient connected to face mask oxgen  Post-op Assessment:  Report given to ENDO RN and Post -op Vital signs reviewed and stable  Post vital signs:  Reviewed and stable  Last Vitals:  Vitals:   06/08/16 0751 06/08/16 0847  BP: (!) 158/92 (!) (P) 154/95  Pulse:  (!) (P) 58  Resp: 15 (P) 18  Temp: 17.4 C     Complications: No apparent anesthesia complications

## 2016-06-08 NOTE — Op Note (Signed)
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 Estimated blood loss:                            None. Estimated Blood Loss:     Estimated blood loss: none. Procedure:                Pre-Anesthesia Assessment:                           - Prior to the procedure, a History and Physical                            was performed, and patient medications and                            allergies were reviewed. The patient's tolerance of                            previous anesthesia was also reviewed. The risks                            and benefits of the procedure and the sedation                            options and risks were discussed with the patient.                            All questions were answered, and informed consent                            was obtained. Prior Anticoagulants: The patient has  taken no previous anticoagulant or antiplatelet                            agents. ASA Grade Assessment: II - A patient with                            mild systemic disease. After reviewing the risks                            and benefits, the patient was deemed in                            satisfactory condition to undergo the procedure.                           After obtaining informed  consent, the endoscope was                            passed under direct vision. Throughout the                            procedure, the patient's blood pressure, pulse, and                            oxygen saturations were monitored continuously. The                            [id number] ([id number]) scope was introduced through                            the mouth, and advanced to the third part of                            duodenum. The upper EUS was accomplished without                            difficulty. The patient tolerated the procedure                            well. Scope In: Scope Out: Findings:      Endoscopic Finding :      The examined esophagus was endoscopically normal.      The entire examined stomach was endoscopically normal.      The examined duodenum was endoscopically normal. The previously noted       duodenal wall subepithelial lesion was not visible on this examination.      Endosonographic Finding :      There was no sign of significant endosonographic abnormality in the       examined duodenum. No masses were identified.      Limited examination of the pancreas, liver, CBD, were all normal. Impression:               - The previously noted duodenal wall subepithelial  lesion was not visit on this examination. Moderate Sedation:      N/A- Per Anesthesia Care Recommendation:           - Discharge patient to home (ambulatory).                           - Follow clinically Procedure Code(s):        --- Professional ---                           [phone], Esophagogastroduodenoscopy, flexible,                            transoral; with endoscopic ultrasound examination                            limited to the esophagus, stomach or duodenum, and                            adjacent structures Diagnosis Code(s):        --- Professional ---                           K31.89, Other diseases of stomach and duodenum CPT copyright 2016  American Medical Association. All rights reserved. The codes documented in this report are preliminary and upon coder review may  be revised to meet current compliance requirements. Milus Banister, MD 06/08/2016 8:46:14 AM This report has been signed electronically. Number of Addenda: 0

## 2016-06-11 ENCOUNTER — Encounter (HOSPITAL_COMMUNITY): Payer: Self-pay | Admitting: Gastroenterology

## 2016-06-17 DIAGNOSIS — G4733 Obstructive sleep apnea (adult) (pediatric): Secondary | ICD-10-CM | POA: Diagnosis not present

## 2016-06-20 NOTE — Progress Notes (Deleted)
[name] [name] was seen today in the movement disorders clinic for neurologic consultation at the request of Alysia Penna, MD.  The consultation is for the evaluation of tremor.  The first symptom(s) the patient noticed was {parkinsons general sx:18033} and this was {NUMBERS;0-15 BY 1:408015} {days/wks/mos/yrs:310907}.     Tremor: {yes no:314532}   How long has it been going on? ***  At rest or with activation?  ***  When is it noted the most?  ***  Fam hx of tremor?  {yes UV:253664}  Located where?  ***  Affected by caffeine:  {yes no:314532}  Affected by alcohol:  {yes no:314532}  Affected by stress:  {yes no:314532}  Affected by fatigue:  {yes no:314532}  Spills soup if on spoon:  {yes no:314532}  Spills glass of liquid if full:  {yes no:314532}  Affects ADL's (tying shoes, brushing teeth, etc):  {yes no:314532}  Specific Symptoms:  Family hx of similar:  {yes no:314532} Voice: *** Sleep: ***  Vivid Dreams:  {yes no:314532}  Acting out dreams:  {yes no:314532} Wet Pillows: {yes no:314532} Postural symptoms:  {yes no:314532}  Falls?  {yes no:314532} Bradykinesia symptoms: {parkinson brady:18041} Loss of smell:  {yes no:314532} Loss of taste:  {yes no:314532} Urinary Incontinence:  {yes no:314532} Difficulty Swallowing:  {yes no:314532} Handwriting, micrographia: {yes no:314532} Trouble with ADL's:  {yes no:314532}  Trouble buttoning clothing: {yes no:314532} Depression:  {yes no:314532} Memory changes:  {yes no:314532} Hallucinations:  {yes no:314532}  visual distortions: {yes no:314532} N/V:  {yes no:314532} Lightheaded:  {yes no:314532}  Syncope: {yes no:314532} Diplopia:  {yes no:314532} Dyskinesia:  {yes no:314532}  Neuroimaging has *** previously been performed.  It *** available for my review today.  PREVIOUS MEDICATIONS: {Parkinson's RX:18200} on xanax - 0.25 mg and takes 12 per day  ALLERGIES:   Allergies  Allergen Reactions  . Dexamethasone Other  (See Comments)    Pt thinks they used it during surgery and she quit breathing, not sure this was the exact medication.  . Augmentin [Amoxicillin-Pot Clavulanate] Nausea And Vomiting  . Clindamycin/Lincomycin Nausea And Vomiting  . Oxycodone-Acetaminophen Nausea And Vomiting    CURRENT MEDICATIONS:  Outpatient Encounter Prescriptions as of 06/22/2016  Medication Sig  . albuterol (PROVENTIL HFA;VENTOLIN HFA) 108 (90 Base) MCG/ACT inhaler Inhale 2 puffs into the lungs every 6 (six) hours as needed for wheezing.  Marland Kitchen ALPRAZolam (XANAX) 0.25 MG tablet TAKE 3 TABLETS BY MOUTH EVERY 6 HOURS AS NEEDED (Patient taking differently: Take 0.75 mg by mouth 2 (two) times daily. )  . aspirin EC 81 MG tablet Take 1 tablet (81 mg total) by mouth daily.  Marland Kitchen atorvastatin (LIPITOR) 20 MG tablet Take 1 tablet (20 mg total) by mouth daily.  Marland Kitchen buPROPion (WELLBUTRIN XL) 150 MG 24 hr tablet TAKE 1 TABLET (150 MG TOTAL) BY MOUTH DAILY. (Patient taking differently: TAKE 1 TABLET (150 MG TOTAL) BY MOUTH DAILY. AT NIGHT)  . cetirizine (ZYRTEC) 10 MG tablet Take 10 mg by mouth daily as needed for allergies.   . cyclobenzaprine (FLEXERIL) 10 MG tablet Take 1 tablet (10 mg total) by mouth 3 (three) times daily as needed for muscle spasms.  . flecainide (TAMBOCOR) 50 MG tablet Take 1 tablet (50 mg total) by mouth 3 (three) times daily.  . hydrochlorothiazide (HYDRODIURIL) 25 MG tablet TAKE 1 TABLET BY MOUTH EVERY DAY (Patient taking differently: TAKE 1 TABLET BY MOUTH EVERY DAY AT NIGHT)  . HYDROcodone-acetaminophen (NORCO) 10-325 MG tablet Take 1 tablet by mouth every 6 (six)  hours as needed for severe pain. (Patient taking differently: Take 1 tablet by mouth daily as needed for severe pain. )  . lisinopril (PRINIVIL,ZESTRIL) 40 MG tablet Take 1 tablet (40 mg total) by mouth daily. (Patient taking differently: Take 40 mg by mouth every evening. )  . meloxicam (MOBIC) 15 MG tablet Take 1 tablet (15 mg total) by mouth daily.  .  metoprolol (LOPRESSOR) 50 MG tablet TAKE 1 TABLET (50 MG TOTAL) BY MOUTH 2 (TWO) TIMES DAILY.  . nitroGLYCERIN (NITROSTAT) 0.4 MG SL tablet Place 0.4 mg under the tongue every 5 (five) minutes as needed for chest pain (MAX 3 TABLETS). Reported on 07/02/2015  . pantoprazole (PROTONIX) 40 MG tablet TAKE 1 TABLET (40 MG TOTAL) BY MOUTH 2 (TWO) TIMES DAILY.  Marland Kitchen prochlorperazine (COMPAZINE) 10 MG tablet Take 1 tablet (10 mg total) by mouth every 6 (six) hours as needed for nausea or vomiting.   No facility-administered encounter medications on file as of 06/22/2016.     PAST MEDICAL HISTORY:   Past Medical History:  Diagnosis Date  . Allergic rhinitis   . Allergy    seasonal  . Anxiety   . Cataract    bilateral  . Complication of anesthesia 2003   Medication for block "went up to my brain and I stopped breathing"  . COPD (chronic obstructive pulmonary disease) ([place])   . Depression   . Dexamethasone adverse reaction    2002 normal  . GERD (gastroesophageal reflux disease)   . Hyperlipidemia   . Hypertension   . Insomnia   . Osteoarthritis   . Overactive bladder   . Pneumonia    once or twivce in past  . Premature ventricular contractions    LBBB inferior axis PVCs  . Tobacco abuse   . Ulcer ([place])    duodenal    PAST SURGICAL HISTORY:   Past Surgical History:  Procedure Laterality Date  . CARDIAC CATHETERIZATION N/A 12/03/2015   Procedure: Left Heart Cath and Coronary Angiography;  Surgeon: Burnell Blanks, MD;  Location: [place] [facility];  Service: Cardiovascular;  Laterality: N/A;  . CARPAL TUNNEL RELEASE Left    1997 left,    . CHOLECYSTECTOMY N/A 03/23/2015   Procedure: LAPAROSCOPIC CHOLECYSTECTOMY;  Surgeon: Ralene Ok, MD;  Location: [place];  Service: General;  Laterality: N/A;  . COLONOSCOPY  10-17-13   per Dr. Olevia Perches, benign polyps, repeat in 10 years  . EUS N/A 11/12/2014   Procedure: UPPER ENDOSCOPIC ULTRASOUND (EUS) LINEAR;  Surgeon: Milus Banister, MD;   Location: [facility];  Service: Endoscopy;  Laterality: N/A;  . EUS N/A 06/08/2016   Procedure: UPPER ENDOSCOPIC ULTRASOUND (EUS) RADIAL;  Surgeon: Milus Banister, MD;  Location: [facility];  Service: Endoscopy;  Laterality: N/A;  . LEFT AND RIGHT HEART CATHETERIZATION WITH CORONARY ANGIOGRAM N/A 04/22/2014   Procedure: LEFT AND RIGHT HEART CATHETERIZATION WITH CORONARY ANGIOGRAM;  Surgeon: Burnell Blanks, MD;  Location[facility];  Service: Cardiovascular;  Laterality: N/A;  . TONSILLECTOMY  1950  . torn cartilage repaired rt wrist Right 2003    SOCIAL HISTORY:   Social History   Social History  . Marital status: Single    Spouse name: N/A  . Number of children: 0  . Years of education: N/A   Occupational History  . CNA    Social History Main Topics  . Smoking status: Current Every Day Smoker    Packs/day: 0.45    Years: 54.00    Types:  Cigarettes  . Smokeless tobacco: Never Used     Comment: 1/2 pack per day or less  . Alcohol use No  . Drug use: No  . Sexual activity: Not on file   Other Topics Concern  . Not on file   Social History Narrative   Works as Quarry manager at [employer].    FAMILY HISTORY:   Family Status  Relation Status  . Brother Deceased  . Mother Deceased  . Father Deceased  . Brother Alive  . Sister Deceased  .    Marland Kitchen Neg Hx     ROS:  A complete 10 system review of systems was obtained and was unremarkable apart from what is mentioned above.  PHYSICAL EXAMINATION:    VITALS:  There were no vitals filed for this visit.  GEN:  The patient appears stated age and is in NAD. HEENT:  Normocephalic, atraumatic.  The mucous membranes are moist. The superficial temporal arteries are without ropiness or tenderness. CV:  RRR Lungs:  CTAB Neck/HEME:  There are no carotid bruits bilaterally.  Neurological examination:  Orientation: The patient is alert and oriented x3. Fund of knowledge is appropriate.  Recent and remote memory are intact.   Attention and concentration are normal.    Able to name objects and repeat phrases. Cranial nerves: There is good facial symmetry. Pupils are equal round and reactive to light bilaterally. Fundoscopic exam reveals clear margins bilaterally. Extraocular muscles are intact. The visual fields are full to confrontational testing. The speech is fluent and clear. Soft palate rises symmetrically and there is no tongue deviation. Hearing is intact to conversational tone. Sensation: Sensation is intact to light and pinprick throughout (facial, trunk, extremities). Vibration is intact at the bilateral big toe. There is no extinction with double simultaneous stimulation. There is no sensory dermatomal level identified. Motor: Strength is 5/5 in the bilateral upper and lower extremities.   Shoulder shrug is equal and symmetric.  There is no pronator drift. Deep tendon reflexes: Deep tendon reflexes are 2/4 at the bilateral biceps, triceps, brachioradialis, patella and achilles. Plantar responses are downgoing bilaterally.  Movement examination: Tone: There is ***tone in the bilateral upper extremities.  The tone in the lower extremities is ***.  Abnormal movements: *** Coordination:  There is *** decremation with RAM's, *** Gait and Station: The patient has *** difficulty arising out of a deep-seated chair without the use of the hands. The patient's stride length is ***.  The patient has a *** pull test.      ASSESSMENT/PLAN:  ***  Cc:  Alysia Penna, MD

## 2016-06-21 ENCOUNTER — Telehealth: Payer: Self-pay | Admitting: *Deleted

## 2016-06-21 NOTE — Telephone Encounter (Signed)
Called the patient to give sleep study results, left a message to call back

## 2016-06-22 ENCOUNTER — Ambulatory Visit: Payer: Medicare Other | Admitting: Neurology

## 2016-06-23 ENCOUNTER — Ambulatory Visit (INDEPENDENT_AMBULATORY_CARE_PROVIDER_SITE_OTHER): Payer: Medicare Other | Admitting: Family Medicine

## 2016-06-23 ENCOUNTER — Encounter: Payer: Self-pay | Admitting: Family Medicine

## 2016-06-23 VITALS — BP 118/86 | HR 66 | Temp 97.7°F | Wt 166.4 lb

## 2016-06-23 DIAGNOSIS — J018 Other acute sinusitis: Secondary | ICD-10-CM | POA: Diagnosis not present

## 2016-06-23 MED ORDER — AZITHROMYCIN 250 MG PO TABS: ORAL_TABLET | ORAL | 0 refills | Status: DC

## 2016-06-23 MED ORDER — HYDROCODONE-ACETAMINOPHEN 10-325 MG PO TABS: 1.0000 | ORAL_TABLET | Freq: Four times a day (QID) | ORAL | 0 refills | Status: DC | PRN

## 2016-06-23 NOTE — Patient Instructions (Signed)
WE NOW OFFER   [name] FAST TRACK!!!  SAME DAY Appointments for ACUTE CARE  Such as: Sprains, Injuries, cuts, abrasions, rashes, muscle pain, joint pain, back pain Colds, flu, sore throats, headache, allergies, cough, fever  Ear pain, sinus and eye infections Abdominal pain, nausea, vomiting, diarrhea, upset stomach Animal/insect bites  3 Easy Ways to Schedule: Walk-In Scheduling Call in scheduling Mychart Sign-up: [email or web address]         

## 2016-06-23 NOTE — Progress Notes (Signed)
Pre visit review using our clinic review tool, if applicable. No additional management support is needed unless otherwise documented below in the visit note. 

## 2016-06-23 NOTE — Progress Notes (Signed)
   Subjective:    Patient ID: [name] [name], female    DOB: June 03, 1950, 66 y.o.   MRN: [id number]  HPI Here for 3 days of sinus pressure, PND, headache, and ST. No cough. She had a fever to 101 degrees at first but none now.    Review of Systems  Constitutional: Positive for fever.  HENT: Positive for congestion, postnasal drip, sinus pain, sinus pressure and sore throat.   Eyes: Negative.   Respiratory: Positive for cough.        Objective:   Physical Exam  Constitutional: She appears well-developed and well-nourished.  HENT:  Right Ear: External ear normal.  Left Ear: External ear normal.  Nose: Nose normal.  Mouth/Throat: Oropharynx is clear and moist.  Eyes: Conjunctivae are normal.  Neck: No thyromegaly present.  Pulmonary/Chest: Effort normal and breath sounds normal.  Lymphadenopathy:    She has no cervical adenopathy.          Assessment & Plan:  Sinusitis, treat with a Zpack. Written out of work today.  Alysia Penna, MD

## 2016-07-03 ENCOUNTER — Other Ambulatory Visit: Payer: Self-pay | Admitting: Family Medicine

## 2016-07-10 ENCOUNTER — Other Ambulatory Visit: Payer: Self-pay | Admitting: Family Medicine

## 2016-07-16 ENCOUNTER — Other Ambulatory Visit: Payer: Self-pay | Admitting: Family Medicine

## 2016-07-18 NOTE — Progress Notes (Signed)
[name] [name] was seen today in the movement disorders clinic for neurologic consultation at the request of Alysia Penna, MD.  The consultation is for the evaluation of tremor.  The records that were made available to me were reviewed.   Pt states that the real issue for her has been for short term memory   Tremor: Yes.     How long has it been going on? Several years  At rest or with activation?  rest  When is it noted the most?  When holding something will drop it.  Intermittent in nature and lasts 5 minutes at max.  It does not even happen daily but perhaps every 3-4 days.  She is R handed but dexterity issues in both hands with dropping objects.  "tremulous" activity only in the L hand.    -Pt is a med tech/CNA  -no known alleviating factors once tremulous activity comes on.  Only involves hand and not arm.  No jerking.  Just fine tremulous activity but no large tremor.    -does have known degenerative disc disease and neck pain  Pt c/o memory change x 1 year.  Mostly short term.  Has no issues at her job as is routine but if someone tells her something she will forget it.  She has no problem driving.  She remembers to take her pills but has to write that down.  She lives with her partner/roommate of 52 years.   They share financial responsibilities.  Her partner does most of the cooking.  Stress with partner who has copd  Neuroimaging has not previously been performed.   PREVIOUS MEDICATIONS:  on xanax - 0.25 mg and takes 9-12 tablets per day, norco 1 time per day  ALLERGIES:   Allergies  Allergen Reactions  . Dexamethasone Other (See Comments)    Pt thinks they used it during surgery and she quit breathing, not sure this was the exact medication.  . Augmentin [Amoxicillin-Pot Clavulanate] Nausea And Vomiting  . Clindamycin/Lincomycin Nausea And Vomiting  . Oxycodone-Acetaminophen Nausea And Vomiting    CURRENT MEDICATIONS:  Outpatient Encounter Prescriptions as of 07/20/2016    Medication Sig  . ALPRAZolam (XANAX) 0.25 MG tablet TAKE 3 TABLETS BY MOUTH EVERY 6 HOURS AS NEEDED (Patient taking differently: Take 0.75 mg by mouth 2 (two) times daily. )  . aspirin EC 81 MG tablet Take 1 tablet (81 mg total) by mouth daily.  Marland Kitchen atorvastatin (LIPITOR) 20 MG tablet Take 1 tablet (20 mg total) by mouth daily.  Marland Kitchen buPROPion (WELLBUTRIN XL) 150 MG 24 hr tablet TAKE 1 TABLET (150 MG TOTAL) BY MOUTH DAILY. (Patient taking differently: TAKE 1 TABLET (150 MG TOTAL) BY MOUTH DAILY. AT NIGHT)  . cetirizine (ZYRTEC) 10 MG tablet Take 10 mg by mouth daily as needed for allergies.   . cyclobenzaprine (FLEXERIL) 10 MG tablet Take 1 tablet (10 mg total) by mouth 3 (three) times daily as needed for muscle spasms.  . flecainide (TAMBOCOR) 50 MG tablet Take 1 tablet (50 mg total) by mouth 3 (three) times daily.  . hydrochlorothiazide (HYDRODIURIL) 25 MG tablet TAKE 1 TABLET BY MOUTH EVERY DAY  . HYDROcodone-acetaminophen (NORCO) 10-325 MG tablet Take 1 tablet by mouth every 6 (six) hours as needed for severe pain.  Marland Kitchen lisinopril (PRINIVIL,ZESTRIL) 40 MG tablet Take 1 tablet (40 mg total) by mouth daily. (Patient taking differently: Take 40 mg by mouth every evening. )  . meloxicam (MOBIC) 15 MG tablet Take 1 tablet (  15 mg total) by mouth daily.  . metoprolol (LOPRESSOR) 50 MG tablet TAKE 1 TABLET (50 MG TOTAL) BY MOUTH 2 (TWO) TIMES DAILY.  . nitroGLYCERIN (NITROSTAT) 0.4 MG SL tablet Place 0.4 mg under the tongue every 5 (five) minutes as needed for chest pain (MAX 3 TABLETS). Reported on 07/02/2015  . pantoprazole (PROTONIX) 40 MG tablet TAKE 1 TABLET (40 MG TOTAL) BY MOUTH 2 (TWO) TIMES DAILY.  Marland Kitchen PROAIR HFA 108 (90 Base) MCG/ACT inhaler INHALE 2 PUFFS INTO THE LUNGS EVERY 6 (SIX) HOURS AS NEEDED FOR WHEEZING.  Marland Kitchen prochlorperazine (COMPAZINE) 10 MG tablet Take 1 tablet (10 mg total) by mouth every 6 (six) hours as needed for nausea or vomiting.  . [DISCONTINUED] azithromycin (ZITHROMAX Z-PAK) 250  MG tablet As directed   No facility-administered encounter medications on file as of 07/20/2016.     PAST MEDICAL HISTORY:   Past Medical History:  Diagnosis Date  . Allergic rhinitis   . Allergy    seasonal  . Anxiety   . Cataract    bilateral  . Complication of anesthesia 2003   Medication for block "went up to my brain and I stopped breathing"  . COPD (chronic obstructive pulmonary disease) ([place])   . Depression   . Dexamethasone adverse reaction    2002 normal  . GERD (gastroesophageal reflux disease)   . Hyperlipidemia   . Hypertension   . Insomnia   . Osteoarthritis   . Overactive bladder   . Pneumonia    once or twivce in past  . Premature ventricular contractions    LBBB inferior axis PVCs  . Tobacco abuse   . Ulcer    duodenal    PAST SURGICAL HISTORY:   Past Surgical History:  Procedure Laterality Date  . CARDIAC CATHETERIZATION N/A 12/03/2015   Procedure: Left Heart Cath and Coronary Angiography;  Surgeon: Burnell Blanks, MD;  Location: [place] [facility];  Service: Cardiovascular;  Laterality: N/A;  . CARPAL TUNNEL RELEASE Left    1997 left,    . CHOLECYSTECTOMY N/A 03/23/2015   Procedure: LAPAROSCOPIC CHOLECYSTECTOMY;  Surgeon: Ralene Ok, MD;  Location: [place];  Service: General;  Laterality: N/A;  . COLONOSCOPY  10-17-13   per Dr. Olevia Perches, benign polyps, repeat in 10 years  . EUS N/A 11/12/2014   Procedure: UPPER ENDOSCOPIC ULTRASOUND (EUS) LINEAR;  Surgeon: Milus Banister, MD;  Location: [facility];  Service: Endoscopy;  Laterality: N/A;  . EUS N/A 06/08/2016   Procedure: UPPER ENDOSCOPIC ULTRASOUND (EUS) RADIAL;  Surgeon: Milus Banister, MD;  Location: [facility];  Service: Endoscopy;  Laterality: N/A;  . LEFT AND RIGHT HEART CATHETERIZATION WITH CORONARY ANGIOGRAM N/A 04/22/2014   Procedure: LEFT AND RIGHT HEART CATHETERIZATION WITH CORONARY ANGIOGRAM;  Surgeon: Burnell Blanks, MD;  Location[facility] C[facility] LAB;  Service: Cardiovascular;   Laterality: N/A;  . TONSILLECTOMY  1950  . torn cartilage repaired rt wrist Right 2003    SOCIAL HISTORY:   Social History   Social History  . Marital status: Single    Spouse name: N/A  . Number of children: 0  . Years of education: N/A   Occupational History  . CNA    Social History Main Topics  . Smoking status: Current Every Day Smoker    Packs/day: 0.45    Years: 54.00    Types: Cigarettes  . Smokeless tobacco: Never Used     Comment: 1/2 pack per day or less  . Alcohol use No  .  Drug use: No  . Sexual activity: Not on file   Other Topics Concern  . Not on file   Social History Narrative   Works as Quarry manager at [employer].    FAMILY HISTORY:   Family Status  Relation Status  . Brother Deceased  . Mother Deceased  . Father Deceased  . Brother Alive  . Sister Deceased  .    Marland Kitchen Neg Hx     ROS: Chronic SOB.  L side arm feels weaker than the R - worse after CTS.   A complete 10 system review of systems was obtained and was unremarkable apart from what is mentioned above.  PHYSICAL EXAMINATION:    VITALS:   Vitals:   07/20/16 1327  BP: 128/80  Pulse: (!) 58  SpO2: 94%  Weight: 170 lb (77.1 kg)  Height: 5\' 1"  (1.549 m)    GEN:  The patient appears stated age and is in NAD. HEENT:  Normocephalic, atraumatic.  The mucous membranes are moist. The superficial temporal arteries are without ropiness or tenderness. CV:  RRR Lungs:  CTAB Neck/HEME:  There are no carotid bruits bilaterally.  Neurological examination:  Orientation:  Montreal Cognitive Assessment  07/20/2016  Visuospatial/ Executive (0/5) 4  Naming (0/3) 3  Attention: Read list of digits (0/2) 2  Attention: Read list of letters (0/1) 1  Attention: Serial 7 subtraction starting at 100 (0/3) 3  Language: Repeat phrase (0/2) 2  Language : Fluency (0/1) 0  Abstraction (0/2) 2  Delayed Recall (0/5) 0  Orientation (0/6) 6  Total 23  Adjusted Score (based on education) 24   Cranial nerves:  There is mild L pseudoptosis from lid lag.  There is good and symmetric NL folds but when she speaks the R side of the face is more activated than the L. Pupils are equal round and reactive to light bilaterally. Fundoscopic exam is attempted but the disc margins are not well visualized bilaterally. Extraocular muscles are intact. The visual fields are full to confrontational testing. The speech is fluent and clear. Soft palate rises symmetrically and there is no tongue deviation. Hearing is intact to conversational tone. Sensation: Sensation is intact to light and pinprick throughout (facial, trunk, extremities). Vibration is intact at the bilateral big toe. There is no extinction with double simultaneous stimulation. There is no sensory dermatomal level identified. Motor: Strength is 5/5 in the bilateral upper and lower extremities.   Shoulder shrug is equal and symmetric.  There is no pronator drift. Deep tendon reflexes: Deep tendon reflexes are 2/4 at the bilateral biceps, triceps, brachioradialis, absent at the bilateral patella and achilles. Plantar responses are downgoing bilaterally.  Movement examination: Tone: There is normal tone in the bilateral upper extremities.  The tone in the lower extremities is normal.  Abnormal movements: none.  she has no difficulty with archimedes spirals.   Coordination:  There is no decremation with RAM's, with any form of RAMS, including alternating supination and pronation of the forearm, hand opening and closing, finger taps, heel taps and toe taps. Gait and Station: The patient has no difficulty arising out of a deep-seated chair without the use of the hands.  Able to walk in a tandem fashion.  Stands in romberg position and heel toe walks without trouble.     Lab Results  Component Value Date   TSH 1.67 12/30/2015     Chemistry      Component Value Date/Time   NA 133 (L) 05/24/2016 2440  K 4.3 05/24/2016 0947   CL 90 (L) 05/24/2016 0947   CO2 25  05/24/2016 0947   BUN 16 05/24/2016 0947   CREATININE 1.07 (H) 05/24/2016 0947   CREATININE 1.12 (H) 11/25/2015 0832      Component Value Date/Time   CALCIUM 9.2 05/24/2016 0947   ALKPHOS 58 03/22/2016 1027   AST 12 03/22/2016 1027   ALT 7 03/22/2016 1027   BILITOT 0.4 03/22/2016 1027       ASSESSMENT/PLAN:  1.  Loss of hand dexterity  -suspect due to worsening carpal tunnel syndrome.  Had repair on the L years ago but refused on the right.  She is having trouble giving injections at work due to dexterity issues.    -will do EMG.  Should help [place] look at CTS but also whether there is radicular component given neck pain  2.  Intermittent episodes of L hand tremor  -doesn't sound like true tremor but more tremulous.  Nothing provoking.  Wonder if they occur when xanax is not being taken but doesn't really sound like a withdrawal seizure as that would not be focal.  Will take a wait and see approach for now.  Would be helpful if patient able to capture on video.  She thinks that tremor and dropping of objects are somehow related.  3.  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  Offered neuropsych testing but she wants to hold for now.  She will let me know if she changes her mind  -am going to do MRI brain given the fact that she did have mild L facial droop (mostly pseudoptosis but some assymetric facial expression when speaking).  4.  Much greater than 50% of this visit was spent in counseling and coordinating care.  Total face to face time:  60 min   Cc:  Alysia Penna, MD

## 2016-07-20 ENCOUNTER — Encounter: Payer: Self-pay | Admitting: Neurology

## 2016-07-20 ENCOUNTER — Ambulatory Visit (INDEPENDENT_AMBULATORY_CARE_PROVIDER_SITE_OTHER): Payer: Medicare Other | Admitting: Neurology

## 2016-07-20 VITALS — BP 128/80 | HR 58 | Ht 61.0 in | Wt 170.0 lb

## 2016-07-20 DIAGNOSIS — R413 Other amnesia: Secondary | ICD-10-CM

## 2016-07-20 DIAGNOSIS — R29810 Facial weakness: Secondary | ICD-10-CM | POA: Diagnosis not present

## 2016-07-20 DIAGNOSIS — G5603 Carpal tunnel syndrome, bilateral upper limbs: Secondary | ICD-10-CM | POA: Diagnosis not present

## 2016-07-20 NOTE — Patient Instructions (Signed)
1. We have sent a referral to [facility] for your MRI and they will call you directly to schedule your appt. They are located at [place]. If you need to contact them directly please call [phone].  2. We will schedule EMG.

## 2016-07-25 ENCOUNTER — Ambulatory Visit (INDEPENDENT_AMBULATORY_CARE_PROVIDER_SITE_OTHER): Payer: Medicare Other | Admitting: Family Medicine

## 2016-07-25 ENCOUNTER — Encounter: Payer: Self-pay | Admitting: Family Medicine

## 2016-07-25 VITALS — BP 130/98 | HR 66 | Temp 98.2°F | Ht 61.0 in | Wt 171.0 lb

## 2016-07-25 DIAGNOSIS — S80211A Abrasion, right knee, initial encounter: Secondary | ICD-10-CM

## 2016-07-25 NOTE — Patient Instructions (Signed)
WE NOW OFFER   [facility] FAST TRACK!!!  SAME DAY Appointments for ACUTE CARE  Such as: Sprains, Injuries, cuts, abrasions, rashes, muscle pain, joint pain, back pain Colds, flu, sore throats, headache, allergies, cough, fever  Ear pain, sinus and eye infections Abdominal pain, nausea, vomiting, diarrhea, upset stomach Animal/insect bites  3 Easy Ways to Schedule: Walk-In Scheduling Call in scheduling Mychart Sign-up: [email or web address]         

## 2016-07-25 NOTE — Progress Notes (Signed)
   Subjective:    Patient ID: [name] [name], female    DOB: Jul 02, 1950, 66 y.o.   MRN: [id number]  HPI Here to check on an injury to the right knee which occurred in our parking lot about 20 minutes ago. She was helping her roommate get out of the car with her walker and [name] did not notice the raised curb. She fell forwards and landed on the right knee. There is no pain in the knee and she can walk on it without difficulty.    Review of Systems  Constitutional: Negative.   Musculoskeletal: Negative for arthralgias and gait problem.  Skin: Positive for wound.       Objective:   Physical Exam  Constitutional: She appears well-developed and well-nourished. No distress.  Musculoskeletal:  The right knee shows a small superficial abrasion over the tibial tubercle. Otherwise no joint tenderness or swelling, ROM is full           Assessment & Plan:  Abrasion, this was cleaned with soap and water, then dressed with Neosporin and a Bandaid. Recheck prn only. Alysia Penna, MD

## 2016-07-25 NOTE — Progress Notes (Signed)
Pre visit review using our clinic review tool, if applicable. No additional management support is needed unless otherwise documented below in the visit note. 

## 2016-07-26 ENCOUNTER — Other Ambulatory Visit: Payer: Self-pay | Admitting: Cardiovascular Disease

## 2016-07-26 ENCOUNTER — Other Ambulatory Visit: Payer: Self-pay | Admitting: Family Medicine

## 2016-08-04 ENCOUNTER — Telehealth: Payer: Self-pay | Admitting: *Deleted

## 2016-08-04 NOTE — Telephone Encounter (Signed)
Patient asked why she needed another sleep study if this one was normal. Dr.Turner answered saying the patient still has excessive daytime sleepiness and she feels an in lab test can give her more data.

## 2016-08-04 NOTE — Telephone Encounter (Signed)
-----   Message from Sueanne Margarita, MD sent at 08/03/2016  4:05 PM EDT ----- Normal sleep study - please order in lab PSG  Traci Turner ----- Message ----- From: Freada Bergeron, CMA Sent: 08/02/2016   5:03 PM To: Sueanne Margarita, MD  To Dr. Radford Pax to result and advise

## 2016-08-09 NOTE — Telephone Encounter (Signed)
Called patient LMTCB. 

## 2016-08-09 NOTE — Telephone Encounter (Signed)
Late entry:   Attempted to call patient 5/4 unable to leave message

## 2016-08-09 NOTE — Telephone Encounter (Signed)
Forwarded to [facility] to advise

## 2016-08-09 NOTE — Telephone Encounter (Signed)
Please forward this to the provider who ordered the test to see what they want to do

## 2016-08-09 NOTE — Telephone Encounter (Signed)
If patient does not wish to have formal sleep study in lab, ok to cancel  [name] Marshall, NP 08/09/2016 3:25 PM

## 2016-08-09 NOTE — Telephone Encounter (Signed)
Patient called back today and informed me that her  excessive daytime sleepiness is due to the fact that she works nights and sleeps during the day.

## 2016-08-16 NOTE — Telephone Encounter (Signed)
Patient does not wish to proceed with in lab study

## 2016-08-17 ENCOUNTER — Ambulatory Visit (INDEPENDENT_AMBULATORY_CARE_PROVIDER_SITE_OTHER): Payer: Medicare Other | Admitting: Neurology

## 2016-08-17 ENCOUNTER — Telehealth: Payer: Self-pay | Admitting: Neurology

## 2016-08-17 ENCOUNTER — Telehealth: Payer: Self-pay | Admitting: Family Medicine

## 2016-08-17 DIAGNOSIS — G5603 Carpal tunnel syndrome, bilateral upper limbs: Secondary | ICD-10-CM

## 2016-08-17 MED ORDER — WRIST SPLINT/COCK-UP/RIGHT M MISC: 1.0000 | Freq: Every day | 0 refills | Status: DC

## 2016-08-17 MED ORDER — WRIST SPLINT/COCK-UP/LEFT M MISC: 1.0000 | Freq: Every day | 0 refills | Status: DC

## 2016-08-17 NOTE — Procedures (Signed)
[facility]  [address], [place]  [place], [place] [address] Tel: [phone] Fax:  [phone] Test Date:  08/17/2016  Patient: [name] [name] DOB: 1951-01-30 Physician: Narda Amber, DO  Sex: Female Height: 5\' 1"  Ref Phys: Alonza Bogus, D.O.  ID#: [id number] Temp: 33.1C Technician:    Patient Complaints: This is a 66 year-old female referred for hand weakness and difficulty with dexterity.  NCV & EMG Findings: Extensive electrodiagnostic testing of the right upper extremity and additional studies of the left shows:  1. Right median sensory response shows prolonged distal peak latency (4.0 ms) and asymmetrically reduced amplitude as compared to the left (18.6 V). Left median and bilateral ulnar sensory responses are within normal limits. Left mixed palmar sensory response shows prolonged latency. 2. Bilateral median and ulnar motor responses are within normal limits. 3. There is no evidence of active or chronic motor axon loss changes affecting any of the tested muscles. Motor unit configuration and recruitment pattern is within normal limits.  Impression: 1. Bilateral median neuropathy at or distal to the wrist, consistent with the clinical diagnosis of carpal tunnel syndrome; these findings are mild in degree electrically, and worse on the right. 2. There is no evidence of a cervical radiculopathy affecting the upper extremities.   ___________________________ Narda Amber, DO    Nerve Conduction Studies Anti Sensory Summary Table   Site NR Peak (ms) Norm Peak (ms) P-T Amp (V) Norm P-T Amp  Left Median Anti Sensory (2nd Digit)  33.1C  Wrist    3.4 <3.8 36.6 >10  Right Median Anti Sensory (2nd Digit)  33.1C  Wrist    4.0 <3.8 18.6 >10  Left Ulnar Anti Sensory (5th Digit)  33.1C  Wrist    2.8 <3.2 24.2 >5  Right Ulnar Anti Sensory (5th Digit)  33.1C  Wrist    2.6 <3.2 22.1 >5   Motor Summary Table   Site NR Onset (ms) Norm Onset (ms) O-P Amp (mV)  Norm O-P Amp Site1 Site2 Delta-0 (ms) Dist (cm) Vel (m/s) Norm Vel (m/s)  Left Median Motor (Abd Poll Brev)  33.1C  Wrist    3.4 <4.0 7.6 >5 Elbow Wrist 4.6 24.0 52 >50  Elbow    8.0  7.6         Right Median Motor (Abd Poll Brev)  33.1C  Wrist    4.0 <4.0 7.4 >5 Elbow Wrist 4.4 25.0 57 >50  Elbow    8.4  7.1         Left Ulnar Motor (Abd Dig Minimi)  33.1C  Wrist    2.5 <3.1 9.6 >7 B Elbow Wrist 3.8 21.0 55 >50  B Elbow    6.3  9.0  A Elbow B Elbow 1.9 10.0 53 >50  A Elbow    8.2  8.5         Right Ulnar Motor (Abd Dig Minimi)  33.1C  Wrist    2.3 <3.1 10.8 >7 B Elbow Wrist 3.6 21.0 58 >50  B Elbow    5.9  9.7  A Elbow B Elbow 2.0 10.0 50 >50  A Elbow    7.9  9.1          Comparison Summary Table   Site NR Peak (ms) Norm Peak (ms) P-T Amp (V) Site1 Site2 Delta-P (ms) Norm Delta (ms)  Left Median/Ulnar Palm Comparison (Wrist - 8cm)  33.1C  Median Palm    1.9 <2.2 43.6 Median Palm Ulnar Palm 0.5  Ulnar Palm    1.4 <2.2 15.2       EMG   Side Muscle Ins Act Fibs Psw Fasc Number Recrt Dur Dur. Amp Amp. Poly Poly. Comment  Right 1stDorInt Nml Nml Nml Nml Nml Nml Nml Nml Nml Nml Nml Nml N/A  Right Abd Poll Brev Nml Nml Nml Nml Nml Nml Nml Nml Nml Nml Nml Nml N/A  Right Ext Indicis Nml Nml Nml Nml Nml Nml Nml Nml Nml Nml Nml Nml N/A  Right PronatorTeres Nml Nml Nml Nml Nml Nml Nml Nml Nml Nml Nml Nml N/A  Right Biceps Nml Nml Nml Nml Nml Nml Nml Nml Nml Nml Nml Nml N/A  Right Triceps Nml Nml Nml Nml Nml Nml Nml Nml Nml Nml Nml Nml N/A  Right Deltoid Nml Nml Nml Nml Nml Nml Nml Nml Nml Nml Nml Nml N/A  Left 1stDorInt Nml Nml Nml Nml Nml Nml Nml Nml Nml Nml Nml Nml N/A  Left Abd Poll Brev Nml Nml Nml Nml Nml Nml Nml Nml Nml Nml Nml Nml N/A  Left Ext Indicis Nml Nml Nml Nml Nml Nml Nml Nml Nml Nml Nml Nml N/A  Left PronatorTeres Nml Nml Nml Nml Nml Nml Nml Nml Nml Nml Nml Nml N/A  Left Biceps Nml Nml Nml Nml Nml Nml Nml Nml Nml Nml Nml Nml N/A  Left Triceps Nml Nml Nml Nml Nml Nml  Nml Nml Nml Nml Nml Nml N/A  Left Deltoid Nml Nml Nml Nml Nml Nml Nml Nml Nml Nml Nml Nml N/A      Waveforms:

## 2016-08-17 NOTE — Telephone Encounter (Signed)
Patient made aware of results. She is not interested in surgery. She states she had Carpal Tunnel surgery on left wrist before and it made it worse. RX mailed to patient for wrist splints per her request.

## 2016-08-17 NOTE — Progress Notes (Signed)
The test confirms she has carpal tunnel syndrome, as we suspected. I can refer her to a Hand Surgery clinic if she wishes

## 2016-08-17 NOTE — Telephone Encounter (Signed)
Left message on machine for patient to call back.

## 2016-08-17 NOTE — Telephone Encounter (Signed)
I left a voice message for pt to return my call, have results to go over

## 2016-08-17 NOTE — Telephone Encounter (Signed)
-----   Message from McKinnon, DO sent at 08/17/2016  9:34 AM EDT ----- Let pt know that no evidence of pinched nerve from neck but is evidence of carpal tunnel, overall mild with R worse than L.  Can refer back to hand surgery or try the wrist splints

## 2016-08-23 ENCOUNTER — Telehealth: Payer: Self-pay | Admitting: Neurology

## 2016-08-23 ENCOUNTER — Ambulatory Visit
Admission: RE | Admit: 2016-08-23 | Discharge: 2016-08-23 | Disposition: A | Discharge status: Home / Self Care | Payer: Medicare Other | Source: Ambulatory Visit | Attending: Neurology | Admitting: Neurology

## 2016-08-23 DIAGNOSIS — R29810 Facial weakness: Secondary | ICD-10-CM

## 2016-08-23 DIAGNOSIS — R413 Other amnesia: Secondary | ICD-10-CM | POA: Diagnosis not present

## 2016-08-23 NOTE — Telephone Encounter (Signed)
Left message on machine for patient to call back.

## 2016-08-23 NOTE — Telephone Encounter (Signed)
-----   Message from Ruby, DO sent at 08/23/2016  2:57 PM EDT ----- Reviewed.  Mild small vessel disease.  Luvenia Starch, you can let pt know that MRI looks okay.  Mild hardening of arteries in brain but nothing out of proportion to age/medical problems and overall looks good

## 2016-08-23 NOTE — Telephone Encounter (Signed)
Patient made aware of results.  

## 2016-08-31 ENCOUNTER — Other Ambulatory Visit: Payer: Self-pay | Admitting: Family Medicine

## 2016-08-31 MED ORDER — PANTOPRAZOLE SODIUM 40 MG PO TBEC: DELAYED_RELEASE_TABLET | ORAL | 0 refills | Status: DC

## 2016-09-01 NOTE — Telephone Encounter (Signed)
Call in Wellbutrin #[phone] and Pantoprazole #[phone] with 3 rf each

## 2016-09-17 ENCOUNTER — Other Ambulatory Visit: Payer: Self-pay | Admitting: Nurse Practitioner

## 2016-09-24 NOTE — Progress Notes (Signed)
/   Chief Complaint  Patient presents with  . Follow-up    CAD     History of Present Illness: 66 yo female with history of CAD, HTN, HLD, GERD, OA, depression, ongoing tobacco abuse, COPD, palpitations/PVCs who is here today for cardiac follow up. I saw her in May 2015 for evaluation of dyspnea, weakness. She had been on beta blocker for years for palpitations. Echo June 2015 showed normal LV size and function, possible bicuspid aortic valve, mildly dilated aortic root. Due to a shadow in the aorta, a CTA chest was arranged to exclude dissection. No dissection noted. Event monitor showed sinus rhythm with PVCs, bigeminy. Inderal changed to metoprolol. Cardiac cath January 2016 showed moderate mid LAD stenosis which was evaluated with FFR (0.82-0.85 suggesting this stenosis was not flow limiting). The Circumflex and RCA had mild disease. I saw her in March 2016 and she c/o palpitations associated with dizziness and weakness, bradycardia. She was seen by Dr. Rayann Heman and was started on Flecainide. Her PVC burden did improve with this but she has not tolerated high doses of Flecainide. She has tolerated metoprolol and low dose Flecainide. PVCs have not been frequent enough to consider ablation. Repeat cardiac cath in September 2017 with 50% mid LAD stenosis, mild disease in the RCA and Circumflex. She had a home sleep study in March 2018 and she was told it was normal.   She is here today for follow up. She is feeling well overall. She has rare chest pains. Baseline dyspnea. Rare palpitations. No lower extremity edema, orthopnea, PND, dizziness, near syncope or syncope. She is still smoking.   Primary Care Physician: [name], MD   Past Medical History:  Diagnosis Date  . Allergic rhinitis   . Allergy    seasonal  . Anxiety   . Cataract    bilateral  . Complication of anesthesia 2003   Medication for block "went up to my brain and I stopped breathing"  . COPD (chronic obstructive  pulmonary disease) ([place])   . Depression   . Dexamethasone adverse reaction    2002 normal  . GERD (gastroesophageal reflux disease)   . Hyperlipidemia   . Hypertension   . Insomnia   . Osteoarthritis   . Overactive bladder   . Pneumonia    once or twivce in past  . Premature ventricular contractions    LBBB inferior axis PVCs  . Tobacco abuse   . Ulcer    duodenal    Past Surgical History:  Procedure Laterality Date  . CARDIAC CATHETERIZATION N/A 12/03/2015   Procedure: Left Heart Cath and Coronary Angiography;  Surgeon: Burnell Blanks, MD;  Location: [facility];  Service: Cardiovascular;  Laterality: N/A;  . CARPAL TUNNEL RELEASE Left    1997 left,    . CHOLECYSTECTOMY N/A 03/23/2015   Procedure: LAPAROSCOPIC CHOLECYSTECTOMY;  Surgeon: Ralene Ok, MD;  Location: [facility];  Service: General;  Laterality: N/A;  . COLONOSCOPY  10-17-13   per Dr. Olevia Perches, benign polyps, repeat in 10 years  . EUS N/A 11/12/2014   Procedure: UPPER ENDOSCOPIC ULTRASOUND (EUS) LINEAR;  Surgeon: Milus Banister, MD;  Location: [facility];  Service: Endoscopy;  Laterality: N/A;  . EUS N/A 06/08/2016   Procedure: UPPER ENDOSCOPIC ULTRASOUND (EUS) RADIAL;  Surgeon: Milus Banister, MD;  Location: [facility];  Service: Endoscopy;  Laterality: N/A;  . LEFT AND RIGHT HEART CATHETERIZATION WITH CORONARY ANGIOGRAM N/A 04/22/2014   Procedure: LEFT AND RIGHT HEART CATHETERIZATION  WITH CORONARY ANGIOGRAM;  Surgeon: Burnell Blanks, MD;  Location: [facility];  Service: Cardiovascular;  Laterality: N/A;  . TONSILLECTOMY  1950  . torn cartilage repaired rt wrist Right 2003    Current Outpatient Prescriptions  Medication Sig Dispense Refill  . ALPRAZolam (XANAX) 0.25 MG tablet TAKE 3 TABLETS BY MOUTH EVERY 6 HOURS AS NEEDED (Patient taking differently: Take 0.75 mg by mouth 2 (two) times daily. ) 360 tablet 2  . aspirin EC 81 MG tablet Take 1 tablet (81 mg total) by mouth daily. 90 tablet 3    . atorvastatin (LIPITOR) 20 MG tablet Take 1 tablet (20 mg total) by mouth daily. 90 tablet 3  . buPROPion (WELLBUTRIN XL) 150 MG 24 hr tablet TAKE 1 TABLET (150 MG TOTAL) BY MOUTH DAILY. 90 tablet 0  . cetirizine (ZYRTEC) 10 MG tablet Take 10 mg by mouth daily as needed for allergies.     . cyclobenzaprine (FLEXERIL) 10 MG tablet Take 1 tablet (10 mg total) by mouth 3 (three) times daily as needed for muscle spasms. 90 tablet 5  . Elastic Bandages & Supports (WRIST SPLINT/COCK-UP/LEFT M) MISC 1 Device by Does not apply route daily. DX: G56.03 1 each 0  . Elastic Bandages & Supports (WRIST SPLINT/COCK-UP/RIGHT M) MISC 1 Device by Does not apply route daily. DX: G56.03 1 each 0  . flecainide (TAMBOCOR) 50 MG tablet TAKE 1 TABLET (50 MG TOTAL) BY MOUTH 3 (THREE) TIMES DAILY. 270 tablet 2  . hydrochlorothiazide (HYDRODIURIL) 25 MG tablet TAKE 1 TABLET BY MOUTH EVERY DAY 90 tablet 0  . HYDROcodone-acetaminophen (NORCO) 10-325 MG tablet Take 1 tablet by mouth every 6 (six) hours as needed for severe pain. 120 tablet 0  . lisinopril (PRINIVIL,ZESTRIL) 40 MG tablet Take 1 tablet (40 mg total) by mouth daily. 30 tablet 6  . meloxicam (MOBIC) 15 MG tablet Take 1 tablet (15 mg total) by mouth daily. 30 tablet 11  . metoprolol (LOPRESSOR) 50 MG tablet TAKE 1 TABLET (50 MG TOTAL) BY MOUTH 2 (TWO) TIMES DAILY. 180 tablet 3  . nitroGLYCERIN (NITROSTAT) 0.4 MG SL tablet PLACE 1 TABLET (0.4 MG TOTAL) UNDER THE TONGUE EVERY 5 (FIVE) MINUTES AS NEEDED FOR CHEST PAIN. 25 tablet 5  . pantoprazole (PROTONIX) 40 MG tablet *121617*TAKE 1 TABLET (40 MG TOTAL) BY MOUTH 2 (TWO) TIMES DAILY. 180 tablet 0  . PROAIR HFA 108 (90 Base) MCG/ACT inhaler INHALE 2 PUFFS INTO THE LUNGS EVERY 6 (SIX) HOURS AS NEEDED FOR WHEEZING. 8.5 Inhaler 1  . prochlorperazine (COMPAZINE) 10 MG tablet Take 1 tablet (10 mg total) by mouth every 6 (six) hours as needed for nausea or vomiting. 60 tablet 2   No current facility-administered  medications for this visit.     Allergies  Allergen Reactions  . Dexamethasone Other (See Comments)    Pt thinks they used it during surgery and she quit breathing, not sure this was the exact medication.  . Augmentin [Amoxicillin-Pot Clavulanate] Nausea And Vomiting  . Clindamycin/Lincomycin Nausea And Vomiting  . Oxycodone-Acetaminophen Nausea And Vomiting    Social History   Social History  . Marital status: Single    Spouse name: N/A  . Number of children: 0  . Years of education: N/A   Occupational History  . CNA    Social History Main Topics  . Smoking status: Current Every Day Smoker    Packs/day: 0.45    Years: 54.00    Types: Cigarettes  . Smokeless tobacco: Never  Used     Comment: 1/2 pack per day or less  . Alcohol use No  . Drug use: No  . Sexual activity: Not on file   Other Topics Concern  . Not on file   Social History Narrative   Works as Quarry manager at [employer].    Family History  Problem Relation Age of Onset  . CAD Brother   . Kidney disease Brother   . Emphysema Mother   . Other Father        brain tumor  . Throat cancer Brother   . Hyperlipidemia Unknown   . Hypertension Unknown   . Kidney disease Unknown   . Heart disease Unknown   . Colon cancer Neg Hx   . Esophageal cancer Neg Hx   . Stomach cancer Neg Hx   . Rectal cancer Neg Hx     Review of Systems:  As stated in the HPI and otherwise negative.   BP 132/82   Pulse 60   Ht 5\' 1"  (1.549 m)   Wt 172 lb (78 kg)   BMI 32.50 kg/m   Physical Examination: General: Well developed, well nourished, NAD  HEENT: OP clear, mucus membranes moist  SKIN: warm, dry. No rashes. Neuro: No focal deficits  Musculoskeletal: Muscle strength 5/5 all ext  Psychiatric: Mood and affect normal  Neck: No JVD, no carotid bruits, no thyromegaly, no lymphadenopathy.  Lungs:Clear bilaterally, no wheezes, rhonci, crackles Cardiovascular: Regular rate and rhythm. No murmurs, gallops or  rubs. Abdomen:Soft. Bowel sounds present. Non-tender.  Extremities: No lower extremity edema. Pulses are 2 + in the bilateral DP/PT.  Echo 09/03/13: Left ventricle: The cavity size was normal. Systolic function was normal. The estimated ejection fraction was in the range of 60% to 65%. Wall motion was normal; there were no regional wall motion abnormalities. - Aortic valve: Possibly bicuspid; normal thickness leaflets. - Aorta: Dilated aortic root and ascending aorta measuring 40 mm. - Mitral valve: Structurally normal valve. There was no regurgitation. - Left atrium: The atrium was normal in size. - Right ventricle: The cavity size was normal. Wall thickness was normal. - Right atrium: The atrium was normal in size. - Tricuspid valve: Structurally normal valve. There was no regurgitation. - Pulmonic valve: Structurally normal valve. - Inferior vena cava: The vessel was normal in size. - Pericardium, extracardiac: There was no pericardial effusion. Impressions: - Dilated aortic root and ascending aorta measuring 40 mm. There is possible dissection flap in the ascending aorta that needs to be further evaluated by either TEE or CTA.  EKG:  EKG is not  ordered today. The ekg ordered today demonstrates   Recent Labs: 11/25/2015: Hemoglobin 14.9; Platelets 299 12/30/2015: Magnesium 1.9; TSH 1.67 03/22/2016: ALT 7 05/24/2016: BUN 16; Creatinine, Ser 1.07; Potassium 4.3; Sodium 133   Lipid Panel    Component Value Date/Time   CHOL 144 03/22/2016 1027   TRIG 157.0 (H) 03/22/2016 1027   HDL 58.00 03/22/2016 1027   CHOLHDL 2 03/22/2016 1027   VLDL 31.4 03/22/2016 1027   LDLCALC 54 03/22/2016 1027   LDLDIRECT 130.9 11/16/2008 1013     Wt Readings from Last 3 Encounters:  09/25/16 172 lb (78 kg)  07/25/16 171 lb (77.6 kg)  07/20/16 170 lb (77.1 kg)     Other studies Reviewed: Additional studies/ records that were reviewed today include: . Review of the above records  demonstrates:    Assessment and Plan:   1. PVCs: Overall asymptomatic on Flecainide and metoprolol.  2. CAD with stable angina: She has chronic chest pains but these are rare. She is known to have moderate disease in the LAD, not felt to be flow limiting by cath in September 2017. Continue ASA, statin and beta blocker.   3. Tobacco abuse: I have once again discussed smoking cessation. She is trying to stop.   4. Bicuspid aortic valve: No stenosis by echo in 2015. Repeat echo at next visit.   Current medicines are reviewed at length with the patient today.  The patient does not have concerns regarding medicines.  The following changes have been made:    Labs/ tests ordered today include:   No orders of the defined types were placed in this encounter.   Disposition:   FU with me in 12 months  Signed, Lauree Chandler, MD 09/25/2016 9:54 AM    [facility] [address], [place], [place]  [address] Phone: [phone]; Fax: [phone]

## 2016-09-25 ENCOUNTER — Ambulatory Visit (INDEPENDENT_AMBULATORY_CARE_PROVIDER_SITE_OTHER): Payer: Medicare Other | Admitting: Cardiovascular Disease

## 2016-09-25 VITALS — BP 132/82 | HR 60 | Ht 61.0 in | Wt 172.0 lb

## 2016-09-25 DIAGNOSIS — Z720 Tobacco use: Secondary | ICD-10-CM

## 2016-09-25 DIAGNOSIS — I493 Ventricular premature depolarization: Secondary | ICD-10-CM

## 2016-09-25 DIAGNOSIS — I25119 Atherosclerotic heart disease of native coronary artery with unspecified angina pectoris: Secondary | ICD-10-CM

## 2016-09-25 NOTE — Patient Instructions (Signed)
Medication Instructions:  Your physician recommends that you continue on your current medications as directed. Please refer to the Current Medication list given to you today.   Labwork: NONE ORDERED   Testing/Procedures: NONE ORDERED  Follow-Up: Your physician wants you to follow-up in: [place]. You will receive a reminder letter in the mail two months in advance. If you don't receive a letter, please call our office to schedule the follow-up appointment.   Any Other Special Instructions Will Be Listed Below (If Applicable).     If you need a refill on your cardiac medications before your next appointment, please call your pharmacy.

## 2016-10-06 ENCOUNTER — Other Ambulatory Visit: Payer: Self-pay | Admitting: Family Medicine

## 2016-10-06 NOTE — Telephone Encounter (Signed)
Call in #[phone] with one rf 

## 2016-10-06 NOTE — Telephone Encounter (Signed)
Rx has been called in as directed.  

## 2016-10-12 NOTE — Addendum Note (Signed)
Addendum  created 10/12/16 1632 by Lynda Rainwater, MD   Sign clinical note

## 2016-10-12 NOTE — Anesthesia Postprocedure Evaluation (Signed)
Anesthesia Post Note  Patient: [name] [name]  Procedure(s) Performed: Procedure(s) (LRB): UPPER ENDOSCOPIC ULTRASOUND (EUS) RADIAL (N/A)     Anesthesia Post Evaluation  Last Vitals:  Vitals:   06/08/16 0910 06/08/16 0915  BP: (!) 189/97 (!) 202/98  Pulse: (!) 56 (!) 53  Resp: 15 12  Temp:      Last Pain:  Vitals:   06/08/16 0847  TempSrc: Oral                 Lynda Rainwater

## 2016-10-19 ENCOUNTER — Ambulatory Visit (INDEPENDENT_AMBULATORY_CARE_PROVIDER_SITE_OTHER): Payer: Medicare Other | Admitting: Family Medicine

## 2016-10-19 ENCOUNTER — Encounter: Payer: Self-pay | Admitting: Family Medicine

## 2016-10-19 VITALS — BP 119/88 | HR 54 | Temp 98.2°F | Ht 61.0 in | Wt 174.0 lb

## 2016-10-19 DIAGNOSIS — H6122 Impacted cerumen, left ear: Secondary | ICD-10-CM

## 2016-10-19 MED ORDER — HYDROCODONE-ACETAMINOPHEN 10-325 MG PO TABS: 1.0000 | ORAL_TABLET | Freq: Four times a day (QID) | ORAL | 0 refills | Status: DC | PRN

## 2016-10-19 NOTE — Patient Instructions (Signed)
WE NOW OFFER   [facility] FAST TRACK!!!  SAME DAY Appointments for ACUTE CARE  Such as: Sprains, Injuries, cuts, abrasions, rashes, muscle pain, joint pain, back pain Colds, flu, sore throats, headache, allergies, cough, fever  Ear pain, sinus and eye infections Abdominal pain, nausea, vomiting, diarrhea, upset stomach Animal/insect bites  3 Easy Ways to Schedule: Walk-In Scheduling Call in scheduling Mychart Sign-up: [email or web address]         

## 2016-10-19 NOTE — Progress Notes (Signed)
   Subjective:    Patient ID: [name] [name], female    DOB: [birth year], 66 y.o.   MRN: [id number]  HPI Here for a week of decreased hearing in the left ear. No pain or sinus congestion.    Review of Systems  Constitutional: Negative.   HENT: Positive for hearing loss. Negative for congestion, ear discharge, ear pain, sinus pain and sinus pressure.   Eyes: Negative.   Respiratory: Negative.        Objective:   Physical Exam  Constitutional: She appears well-developed and well-nourished.  HENT:  Right Ear: External ear normal.  Nose: Nose normal.  Mouth/Throat: Oropharynx is clear and moist.  Left ear canal is full of cerumen   Eyes: Conjunctivae are normal.  Neck: No thyromegaly present.  Pulmonary/Chest: Effort normal and breath sounds normal.  Lymphadenopathy:    She has no cervical adenopathy.          Assessment & Plan:  Cerumen impaction. This was irrigated clear with water.  [name] Penna, MD

## 2016-10-21 ENCOUNTER — Other Ambulatory Visit: Payer: Self-pay | Admitting: Family Medicine

## 2016-11-30 ENCOUNTER — Other Ambulatory Visit: Payer: Self-pay | Admitting: Family Medicine

## 2016-12-01 ENCOUNTER — Ambulatory Visit (INDEPENDENT_AMBULATORY_CARE_PROVIDER_SITE_OTHER): Payer: Medicare Other | Admitting: Family Medicine

## 2016-12-01 ENCOUNTER — Encounter: Payer: Self-pay | Admitting: Family Medicine

## 2016-12-01 ENCOUNTER — Other Ambulatory Visit: Payer: Self-pay | Admitting: Family Medicine

## 2016-12-01 VITALS — BP 139/90 | HR 57 | Temp 98.2°F | Ht 61.0 in | Wt 173.0 lb

## 2016-12-01 DIAGNOSIS — M5431 Sciatica, right side: Secondary | ICD-10-CM

## 2016-12-01 MED ORDER — METHYLPREDNISOLONE 4 MG PO TBPK: ORAL_TABLET | ORAL | 0 refills | Status: DC

## 2016-12-01 MED ORDER — METHYLPREDNISOLONE ACETATE 80 MG/ML IJ SUSP
120.0000 mg | Freq: Once | INTRAMUSCULAR | Status: AC
  Administered 2016-12-01: 120 mg via INTRAMUSCULAR

## 2016-12-01 NOTE — Progress Notes (Signed)
   Subjective:    Patient ID: MALASIA TORAIN, female    DOB: 1950-04-09, 66 y.o.   MRN: [id number]  HPI Here for the acute onset last night of severe sharp pains from the right buttock down to the right foot. No recent trauma. She does have chronic low back pain. She tried Vicodin with no benefit. The right leg feels weak also but is not numb.    Review of Systems  Constitutional: Negative.   Respiratory: Negative.   Cardiovascular: Negative.   Musculoskeletal: Positive for arthralgias, back pain and gait problem.       Objective:   Physical Exam  Constitutional:  In pain   Cardiovascular: Normal rate, regular rhythm, normal heart sounds and intact distal pulses.   Pulmonary/Chest: Effort normal and breath sounds normal. No respiratory distress. She has no wheezes. She has no rales.  Musculoskeletal:  Tender over the right lower back and especially over the right sciatic notch. ROM is full but SLR is positive on the right           Assessment & Plan:  Sciatica, given a DepoMedrol shot and she will start a Medrol dose pack in the morning. Use ice prn. Written out of work today until 12-05-16.  Alysia Penna, MD

## 2016-12-01 NOTE — Patient Instructions (Signed)
WE NOW OFFER   [facility] FAST TRACK!!!  SAME DAY Appointments for ACUTE CARE  Such as: Sprains, Injuries, cuts, abrasions, rashes, muscle pain, joint pain, back pain Colds, flu, sore throats, headache, allergies, cough, fever  Ear pain, sinus and eye infections Abdominal pain, nausea, vomiting, diarrhea, upset stomach Animal/insect bites  3 Easy Ways to Schedule: Walk-In Scheduling Call in scheduling Mychart Sign-up: [email or web address]         

## 2016-12-01 NOTE — Addendum Note (Signed)
Addended by: Aggie Hacker A on: 12/01/2016 04:31 PM   Modules accepted: Orders

## 2016-12-01 NOTE — Telephone Encounter (Signed)
Script for medrol dose pack sent to [facility], per provider ordered during office visit today.

## 2016-12-24 ENCOUNTER — Other Ambulatory Visit: Payer: Self-pay | Admitting: Family Medicine

## 2017-01-01 ENCOUNTER — Other Ambulatory Visit: Payer: Self-pay | Admitting: Family Medicine

## 2017-01-01 NOTE — Telephone Encounter (Signed)
Pt need new Rx for lisinopril    Pharm:  [facility] on [address]  Pt is out of this medication.

## 2017-01-17 ENCOUNTER — Other Ambulatory Visit: Payer: Self-pay | Admitting: Cardiovascular Disease

## 2017-01-23 ENCOUNTER — Ambulatory Visit (INDEPENDENT_AMBULATORY_CARE_PROVIDER_SITE_OTHER): Payer: Medicare Other

## 2017-01-23 DIAGNOSIS — Z23 Encounter for immunization: Secondary | ICD-10-CM | POA: Diagnosis not present

## 2017-01-29 ENCOUNTER — Ambulatory Visit: Payer: Medicare Other | Admitting: Family Medicine

## 2017-02-09 ENCOUNTER — Telehealth: Payer: Self-pay | Admitting: Family Medicine

## 2017-02-09 NOTE — Telephone Encounter (Signed)
Pt request refill  °HYDROcodone-acetaminophen (NORCO) 10-325 MG tablet °

## 2017-02-12 MED ORDER — HYDROCODONE-ACETAMINOPHEN 10-325 MG PO TABS: 1.0000 | ORAL_TABLET | Freq: Four times a day (QID) | ORAL | 0 refills | Status: DC | PRN

## 2017-02-12 NOTE — Telephone Encounter (Signed)
Done

## 2017-02-12 NOTE — Telephone Encounter (Signed)
Script is ready for pick up here at front ofice, letter given, tried to reach pt and no answer.

## 2017-02-27 ENCOUNTER — Other Ambulatory Visit: Payer: Self-pay | Admitting: Family Medicine

## 2017-02-27 NOTE — Telephone Encounter (Signed)
Sent to PCP for approval.  

## 2017-03-05 ENCOUNTER — Other Ambulatory Visit: Payer: Self-pay | Admitting: Family Medicine

## 2017-03-05 NOTE — Telephone Encounter (Signed)
Call in #[phone] with one rf 

## 2017-03-05 NOTE — Telephone Encounter (Signed)
Sent to PCP for approval.  

## 2017-03-07 ENCOUNTER — Other Ambulatory Visit: Payer: Self-pay | Admitting: Family Medicine

## 2017-04-04 ENCOUNTER — Other Ambulatory Visit: Payer: Self-pay | Admitting: Family Medicine

## 2017-04-04 NOTE — Telephone Encounter (Signed)
Last OV 12/01/2016. Rx was last refilled 03/09/2016 disp 30 with 11 refills. Sent to PCP

## 2017-04-21 ENCOUNTER — Other Ambulatory Visit: Payer: Self-pay | Admitting: Family Medicine

## 2017-06-26 ENCOUNTER — Other Ambulatory Visit: Payer: Self-pay | Admitting: Family Medicine

## 2017-06-27 ENCOUNTER — Encounter: Payer: Self-pay | Admitting: Family Medicine

## 2017-06-27 ENCOUNTER — Ambulatory Visit (INDEPENDENT_AMBULATORY_CARE_PROVIDER_SITE_OTHER): Payer: Medicare Other | Admitting: Family Medicine

## 2017-06-27 VITALS — BP 132/80 | HR 59 | Temp 98.6°F | Ht 61.0 in | Wt 186.4 lb

## 2017-06-27 DIAGNOSIS — M5441 Lumbago with sciatica, right side: Secondary | ICD-10-CM | POA: Diagnosis not present

## 2017-06-27 DIAGNOSIS — G8929 Other chronic pain: Secondary | ICD-10-CM

## 2017-06-27 DIAGNOSIS — M5442 Lumbago with sciatica, left side: Secondary | ICD-10-CM | POA: Diagnosis not present

## 2017-06-27 DIAGNOSIS — F1190 Opioid use, unspecified, uncomplicated: Secondary | ICD-10-CM | POA: Diagnosis not present

## 2017-06-27 DIAGNOSIS — Z5181 Encounter for therapeutic drug level monitoring: Secondary | ICD-10-CM | POA: Diagnosis not present

## 2017-06-27 MED ORDER — HYDROCODONE-ACETAMINOPHEN 10-325 MG PO TABS: 1.0000 | ORAL_TABLET | Freq: Four times a day (QID) | ORAL | 0 refills | Status: AC | PRN

## 2017-06-27 MED ORDER — HYDROCODONE-ACETAMINOPHEN 10-325 MG PO TABS: 1.0000 | ORAL_TABLET | Freq: Four times a day (QID) | ORAL | 0 refills | Status: DC | PRN

## 2017-06-27 NOTE — Progress Notes (Signed)
   Subjective:    Patient ID: [name] [name], female    DOB: 04/26/1950, 67 y.o.   MRN: [id number]  HPI Here for pain management. She is doing well, she finally retired and she has quit smoking.  Indication for chronic opioid: low back pain Medication and dose: Norco 10-325  # pills per month: 120 Last UDS date: 06-27-17 Opioid Treatment Agreement signed (Y/N): 06-27-17 Opioid Treatment Agreement last reviewed with patient:  06-27-17 [facility] reviewed this encounter (include red flags):  06-27-17     Review of Systems  Constitutional: Negative.   Respiratory: Negative.   Cardiovascular: Negative.   Musculoskeletal: Positive for back pain.  Neurological: Negative.        Objective:   Physical Exam  Constitutional: She is oriented to person, place, and time. She appears well-developed and well-nourished.  Cardiovascular: Normal rate, regular rhythm, normal heart sounds and intact distal pulses.  Pulmonary/Chest: Effort normal and breath sounds normal. No respiratory distress. She has no wheezes. She has no rales.  Neurological: She is alert and oriented to person, place, and time.          Assessment & Plan:  Pain management. Meds were refilled.  Alysia Penna, MD

## 2017-07-02 LAB — PAIN MGMT, PROFILE 8 W/CONF, U
6 Acetylmorphine: NEGATIVE ng/mL (ref <10)
ALPHAHYDROXYALPRAZOLAM: 304 ng/mL — AB (ref <25)
ALPHAHYDROXYMIDAZOLAM: NEGATIVE ng/mL (ref <50)
AMINOCLONAZEPAM: NEGATIVE ng/mL (ref <25)
Alcohol Metabolites: NEGATIVE ng/mL (ref <500)
Alphahydroxytriazolam: NEGATIVE ng/mL (ref <50)
Amphetamines: NEGATIVE ng/mL (ref <500)
BENZODIAZEPINES: POSITIVE ng/mL — AB (ref <100)
BUPRENORPHINE, URINE: NEGATIVE ng/mL (ref <5)
Cocaine Metabolite: NEGATIVE ng/mL (ref <150)
Codeine: NEGATIVE ng/mL (ref <50)
Creatinine: 66.8 mg/dL
HYDROCODONE: NEGATIVE ng/mL (ref <50)
HYDROMORPHONE: NEGATIVE ng/mL (ref <50)
HYDROXYETHYLFLURAZEPAM: NEGATIVE ng/mL (ref <50)
LORAZEPAM: NEGATIVE ng/mL (ref <50)
MARIJUANA METABOLITE: NEGATIVE ng/mL (ref <20)
MDMA: NEGATIVE ng/mL (ref <500)
Morphine: NEGATIVE ng/mL (ref <50)
NORHYDROCODONE: 187 ng/mL — AB (ref <50)
Nordiazepam: NEGATIVE ng/mL (ref <50)
OXYCODONE: NEGATIVE ng/mL (ref <100)
Opiates: POSITIVE ng/mL — AB (ref <100)
Oxazepam: NEGATIVE ng/mL (ref <50)
Oxidant: NEGATIVE ug/mL (ref <200)
TEMAZEPAM: NEGATIVE ng/mL (ref <50)
pH: 7.01 (ref 4.5–9.0)

## 2017-07-23 ENCOUNTER — Other Ambulatory Visit: Payer: Self-pay | Admitting: Family Medicine

## 2017-07-24 NOTE — Telephone Encounter (Signed)
Last OV 06/27/2017   Last refilled 03/06/2017 disp 360 with 1 refill   Sent to PCP for approval

## 2017-07-26 ENCOUNTER — Other Ambulatory Visit: Payer: Self-pay | Admitting: Family Medicine

## 2017-07-30 NOTE — Telephone Encounter (Signed)
Call in #[phone] with one rf 

## 2017-07-30 NOTE — Telephone Encounter (Signed)
Called to the pharmacy and left on machine.  No further action required.  Will close note.

## 2017-08-26 ENCOUNTER — Other Ambulatory Visit: Payer: Self-pay | Admitting: Nurse Practitioner

## 2017-10-08 ENCOUNTER — Ambulatory Visit: Payer: Medicare Other | Admitting: Cardiovascular Disease

## 2017-10-08 ENCOUNTER — Encounter: Payer: Self-pay | Admitting: Cardiovascular Disease

## 2017-10-08 ENCOUNTER — Encounter (INDEPENDENT_AMBULATORY_CARE_PROVIDER_SITE_OTHER): Payer: Self-pay

## 2017-10-08 VITALS — BP 132/90 | HR 59 | Ht 61.0 in | Wt 193.8 lb

## 2017-10-08 DIAGNOSIS — I359 Nonrheumatic aortic valve disorder, unspecified: Secondary | ICD-10-CM

## 2017-10-08 DIAGNOSIS — I25119 Atherosclerotic heart disease of native coronary artery with unspecified angina pectoris: Secondary | ICD-10-CM

## 2017-10-08 DIAGNOSIS — I493 Ventricular premature depolarization: Secondary | ICD-10-CM

## 2017-10-08 NOTE — Patient Instructions (Signed)
Medication Instructions:  Your physician recommends that you continue on your current medications as directed. Please refer to the Current Medication list given to you today.   Labwork: none  Testing/Procedures: Your physician has requested that you have an echocardiogram. Echocardiography is a painless test that uses sound waves to create images of your heart. It provides your doctor with information about the size and shape of your heart and how well your heart's chambers and valves are working. This procedure takes approximately one hour. There are no restrictions for this procedure.   Follow-Up:  Your physician recommends that you schedule a follow-up appointment in: 3-4 months  Any Other Special Instructions Will Be Listed Below (If Applicable).  If you need a refill on your cardiac medications before your next appointment, please call your pharmacy.  

## 2017-10-08 NOTE — Progress Notes (Signed)
/   Chief Complaint  Patient presents with  . Follow-up    CAD    History of Present Illness: 67 yo female with history of CAD, HTN, HLD, GERD, OA, depression, ongoing tobacco abuse, COPD, palpitations/PVCs who is here today for cardiac follow up. I saw her in May 2015 for evaluation of dyspnea, weakness. She had been on a beta blocker for years for palpitations. Echo June 2015 showed normal LV size and function, possible bicuspid aortic valve, mildly dilated aortic root. Due to a shadow in the aorta, a CTA chest was arranged to exclude dissection. No dissection noted. Event monitor showed sinus rhythm with PVCs, bigeminy. Inderal changed to metoprolol. Cardiac cath January 2016 showed moderate mid LAD stenosis which was evaluated with FFR (0.82-0.85 suggesting this stenosis was not flow limiting). The Circumflex and RCA had mild disease. I saw her in March 2016 and she c/o palpitations associated with dizziness and weakness, bradycardia. She was seen by Dr. Rayann Heman and was started on Flecainide. Her PVC burden did improve with this but she has not tolerated high doses of Flecainide. She has tolerated metoprolol and low dose Flecainide. PVCs have not been frequent enough to consider ablation. Repeat cardiac cath in September 2017 with 50% mid LAD stenosis, mild disease in the RCA and Circumflex. She had a home sleep study in March 2018 and she was told it was normal.   She is here today for follow up. The patient denies any chest pain, dyspnea, palpitations, lower extremity edema, orthopnea, PND, dizziness, near syncope or syncope. She quit smoking. She describes fatigue but no change in breathing.   Primary Care Physician: [name], MD  Past Medical History:  Diagnosis Date  . Allergic rhinitis   . ALLERGIC RHINITIS 05/08/2007   Qualifier: Diagnosis of  By: Sherlynn Stalls, CMA, [place]    . Allergy    seasonal  . Anxiety   . Anxiety state 05/25/2014  . Cataract    bilateral  . Chronic low back pain  03/09/2016  . Complication of anesthesia 2003   Medication for block "went up to my brain and I stopped breathing"  . COPD (chronic obstructive pulmonary disease) ([place])   . COPD (chronic obstructive pulmonary disease) with chronic bronchitis ([place]) 10/12/2014  . Coronary artery disease involving native coronary artery of native heart with angina pectoris ([place])   . Depression   . Dexamethasone adverse reaction    2002 normal  . Duodenal mass   . Essential hypertension 05/08/2007   Qualifier: Diagnosis of  By: Sherlynn Stalls, CMA, [place]    . GERD 05/08/2007   Qualifier: Diagnosis of  By: Sherlynn Stalls, CMA, [place]    . GERD (gastroesophageal reflux disease)   . Hyperlipemia, mixed 05/08/2007   Qualifier: Diagnosis of  By: Sherlynn Stalls, CMA, [place]    . Hyperlipidemia   . Hypertension   . Insomnia   . Neck pain, chronic 03/09/2016  . Osteoarthritis   . OSTEOARTHRITIS 05/08/2007   Qualifier: Diagnosis of  By: Sherlynn Stalls, CMA, [place]    . Overactive bladder   . Pneumonia    once or twivce in past  . Premature ventricular contraction 07/16/2014  . Premature ventricular contractions    LBBB inferior axis PVCs  . Thoracic back pain 05/30/2014  . Tobacco abuse   . Ulcer    duodenal    Past Surgical History:  Procedure Laterality Date  . CARDIAC CATHETERIZATION N/A 12/03/2015   Procedure: Left Heart Cath and Coronary Angiography;  Surgeon: Annita Brod  Angelena Form, MD;  Location: [facility];  Service: Cardiovascular;  Laterality: N/A;  . CARPAL TUNNEL RELEASE Left    1997 left,    . CHOLECYSTECTOMY N/A 03/23/2015   Procedure: LAPAROSCOPIC CHOLECYSTECTOMY;  Surgeon: Ralene Ok, MD;  Location: [facility];  Service: General;  Laterality: N/A;  . COLONOSCOPY  10-17-13   per Dr. Olevia Perches, benign polyps, repeat in 10 years  . EUS N/A 11/12/2014   Procedure: UPPER ENDOSCOPIC ULTRASOUND (EUS) LINEAR;  Surgeon: Milus Banister, MD;  Location: [facility];  Service: Endoscopy;  Laterality: N/A;  . EUS N/A 06/08/2016   Procedure:  UPPER ENDOSCOPIC ULTRASOUND (EUS) RADIAL;  Surgeon: Milus Banister, MD;  Location: [facility];  Service: Endoscopy;  Laterality: N/A;  . LEFT AND RIGHT HEART CATHETERIZATION WITH CORONARY ANGIOGRAM N/A 04/22/2014   Procedure: LEFT AND RIGHT HEART CATHETERIZATION WITH CORONARY ANGIOGRAM;  Surgeon: Burnell Blanks, MD;  Location: [facility];  Service: Cardiovascular;  Laterality: N/A;  . TONSILLECTOMY  1950  . torn cartilage repaired rt wrist Right 2003    Current Outpatient Medications  Medication Sig Dispense Refill  . ALPRAZolam (XANAX) 0.25 MG tablet TAKE 3 TABLETS BY MOUTH EVERY 6 HOURS AS NEEDED 360 tablet 1  . aspirin EC 81 MG tablet Take 1 tablet (81 mg total) by mouth daily. 90 tablet 3  . atorvastatin (LIPITOR) 20 MG tablet TAKE 1 TABLET (20 MG TOTAL) BY MOUTH DAILY. 90 tablet 3  . buPROPion (WELLBUTRIN XL) 150 MG 24 hr tablet TAKE 1 TABLET BY MOUTH EVERY DAY 90 tablet 3  . cetirizine (ZYRTEC) 10 MG tablet Take 10 mg by mouth daily as needed for allergies.     . cyclobenzaprine (FLEXERIL) 10 MG tablet Take 1 tablet (10 mg total) by mouth 3 (three) times daily as needed for muscle spasms. 90 tablet 5  . Elastic Bandages & Supports (WRIST SPLINT/COCK-UP/LEFT M) MISC 1 Device by Does not apply route daily. DX: G56.03 1 each 0  . Elastic Bandages & Supports (WRIST SPLINT/COCK-UP/RIGHT M) MISC 1 Device by Does not apply route daily. DX: G56.03 1 each 0  . flecainide (TAMBOCOR) 50 MG tablet Take 1 tablet (50 mg total) by mouth 3 (three) times daily. Please keep upcoming appt for future refills. Thank you 270 tablet 0  . hydrochlorothiazide (HYDRODIURIL) 25 MG tablet TAKE 1 TABLET BY MOUTH EVERY DAY 90 tablet 2  . HYDROcodone-acetaminophen (NORCO) 10-325 MG tablet Take 1 tablet by mouth every 6 (six) hours as needed.    Marland Kitchen lisinopril (PRINIVIL,ZESTRIL) 40 MG tablet TAKE 1 TABLET (40 MG TOTAL) BY MOUTH DAILY. 30 tablet 11  . meloxicam (MOBIC) 15 MG tablet TAKE 1 TABLET BY MOUTH EVERY  DAY 30 tablet 11  . methylPREDNISolone (MEDROL DOSEPAK) 4 MG TBPK tablet Take as directed 21 tablet 0  . metoprolol tartrate (LOPRESSOR) 50 MG tablet TAKE 1 TABLET BY MOUTH TWICE A DAY 180 tablet 2  . nitroGLYCERIN (NITROSTAT) 0.4 MG SL tablet PLACE 1 TABLET (0.4 MG TOTAL) UNDER THE TONGUE EVERY 5 (FIVE) MINUTES AS NEEDED FOR CHEST PAIN. 25 tablet 5  . pantoprazole (PROTONIX) 40 MG tablet TAKE 1 TABLET BY MOUTH TWICE A DAY 180 tablet 1  . PROAIR HFA 108 (90 Base) MCG/ACT inhaler INHALE 2 PUFFS INTO THE LUNGS EVERY 6 (SIX) HOURS AS NEEDED FOR WHEEZING. 8.5 Inhaler 1  . prochlorperazine (COMPAZINE) 10 MG tablet Take 1 tablet (10 mg total) by mouth every 6 (six) hours as needed for nausea or vomiting. (  Patient not taking: Reported on 10/08/2017) 60 tablet 2   No current facility-administered medications for this visit.     Allergies  Allergen Reactions  . Augmentin [Amoxicillin-Pot Clavulanate] Nausea And Vomiting  . Clindamycin/Lincomycin Nausea And Vomiting  . Oxycodone-Acetaminophen Nausea And Vomiting    Social History   Socioeconomic History  . Marital status: Single    Spouse name: Not on file  . Number of children: 0  . Years of education: Not on file  . Highest education level: Not on file  Occupational History  . Occupation: CNA  Social Needs  . Financial resource strain: Not on file  . Food insecurity:    Worry: Not on file    Inability: Not on file  . Transportation needs:    Medical: Not on file    Non-medical: Not on file  Tobacco Use  . Smoking status: Former Smoker    Packs/day: 0.45    Years: 54.00    Pack years: 24.30    Types: Cigarettes    Last attempt to quit: 01/01/2017    Years since quitting: 0.7  . Smokeless tobacco: Never Used  . Tobacco comment: 1/2 pack per day or less  Substance and Sexual Activity  . Alcohol use: No    Alcohol/week: 0.0 oz  . Drug use: No  . Sexual activity: Not on file  Lifestyle  . Physical activity:    Days per week: Not  on file    Minutes per session: Not on file  . Stress: Not on file  Relationships  . Social connections:    Talks on phone: Not on file    Gets together: Not on file    Attends religious service: Not on file    Active member of club or organization: Not on file    Attends meetings of clubs or organizations: Not on file    Relationship status: Not on file  . Intimate partner violence:    Fear of current or ex partner: Not on file    Emotionally abused: Not on file    Physically abused: Not on file    Forced sexual activity: Not on file  Other Topics Concern  . Not on file  Social History Narrative   Works as Quarry manager at [employer].    Family History  Problem Relation Age of Onset  . CAD Brother   . Kidney disease Brother   . Emphysema Mother   . Other Father        brain tumor  . Throat cancer Brother   . Hyperlipidemia Unknown   . Hypertension Unknown   . Kidney disease Unknown   . Heart disease Unknown   . Colon cancer Neg Hx   . Esophageal cancer Neg Hx   . Stomach cancer Neg Hx   . Rectal cancer Neg Hx     Review of Systems:  As stated in the HPI and otherwise negative.   BP 132/90   Pulse (!) 59   Ht 5\' 1"  (1.549 m)   Wt 193 lb 12.8 oz (87.9 kg)   SpO2 94%   BMI 36.62 kg/m   Physical Examination:  General: Well developed, well nourished, NAD  HEENT: OP clear, mucus membranes moist  SKIN: warm, dry. No rashes. Neuro: No focal deficits  Musculoskeletal: Muscle strength 5/5 all ext  Psychiatric: Mood and affect normal  Neck: No JVD, no carotid bruits, no thyromegaly, no lymphadenopathy.  Lungs:Clear bilaterally, no wheezes, rhonci, crackles Cardiovascular: Regular rate and rhythm.  No murmurs, gallops or rubs. Abdomen:Soft. Bowel sounds present. Non-tender.  Extremities: No lower extremity edema. Pulses are 2 + in the bilateral DP/PT.  Echo 09/03/13: Left ventricle: The cavity size was normal. Systolic function was normal. The estimated ejection fraction  was in the range of 60% to 65%. Wall motion was normal; there were no regional wall motion abnormalities. - Aortic valve: Possibly bicuspid; normal thickness leaflets. - Aorta: Dilated aortic root and ascending aorta measuring 40 mm. - Mitral valve: Structurally normal valve. There was no regurgitation. - Left atrium: The atrium was normal in size. - Right ventricle: The cavity size was normal. Wall thickness was normal. - Right atrium: The atrium was normal in size. - Tricuspid valve: Structurally normal valve. There was no regurgitation. - Pulmonic valve: Structurally normal valve. - Inferior vena cava: The vessel was normal in size. - Pericardium, extracardiac: There was no pericardial effusion. Impressions: - Dilated aortic root and ascending aorta measuring 40 mm. There is possible dissection flap in the ascending aorta that needs to be further evaluated by either TEE or CTA.  EKG:  EKG is ordered today. The ekg ordered today demonstrates   Recent Labs: No results found for requested labs within last 8760 hours.   Lipid Panel    Component Value Date/Time   CHOL 144 03/22/2016 1027   TRIG 157.0 (H) 03/22/2016 1027   HDL 58.00 03/22/2016 1027   CHOLHDL 2 03/22/2016 1027   VLDL 31.4 03/22/2016 1027   LDLCALC 54 03/22/2016 1027   LDLDIRECT 130.9 11/16/2008 1013     Wt Readings from Last 3 Encounters:  10/08/17 193 lb 12.8 oz (87.9 kg)  06/27/17 186 lb 6.4 oz (84.6 kg)  12/01/16 173 lb (78.5 kg)     Other studies Reviewed: Additional studies/ records that were reviewed today include: . Review of the above records demonstrates:    Assessment and Plan:   1. PVCs: She has rare palpitations. Continue low dose Flecainide and beta blocker.    2. CAD with stable angina: No chest pains. She is known to have moderate disease in the LAD, not felt to be flow limiting by cath in September 2017. Continue ASA, beta blocker and statin.  If she continues to have worsened  fatigue, will consider repeat cath.   3. Tobacco abuse: She stopped smoking.   4. Bicuspid aortic valve: No stenosis by echo in 2015. Repeat echo now.   Current medicines are reviewed at length with the patient today.  The patient does not have concerns regarding medicines.  The following changes have been made:    Labs/ tests ordered today include:   Orders Placed This Encounter  Procedures  . EKG 12-Lead  . ECHOCARDIOGRAM COMPLETE    Disposition:   FU with me in 3-4 months  Signed, Lauree Chandler, MD 10/08/2017 12:56 PM    [address], [place], [place]  [address] Phone: [phone]; Fax: [phone]

## 2017-10-25 ENCOUNTER — Other Ambulatory Visit: Payer: Self-pay | Admitting: Cardiovascular Disease

## 2017-10-29 ENCOUNTER — Other Ambulatory Visit: Payer: Self-pay

## 2017-10-29 ENCOUNTER — Ambulatory Visit (HOSPITAL_COMMUNITY): Payer: Medicare Other | Attending: Internal Medicine

## 2017-10-29 DIAGNOSIS — I25119 Atherosclerotic heart disease of native coronary artery with unspecified angina pectoris: Secondary | ICD-10-CM | POA: Diagnosis not present

## 2017-10-29 DIAGNOSIS — I2510 Atherosclerotic heart disease of native coronary artery without angina pectoris: Secondary | ICD-10-CM | POA: Insufficient documentation

## 2017-10-29 DIAGNOSIS — R002 Palpitations: Secondary | ICD-10-CM | POA: Diagnosis not present

## 2017-10-29 DIAGNOSIS — I110 Hypertensive heart disease with heart failure: Secondary | ICD-10-CM | POA: Diagnosis not present

## 2017-10-29 DIAGNOSIS — I272 Pulmonary hypertension, unspecified: Secondary | ICD-10-CM | POA: Diagnosis not present

## 2017-10-29 DIAGNOSIS — M545 Low back pain: Secondary | ICD-10-CM | POA: Insufficient documentation

## 2017-10-29 DIAGNOSIS — I493 Ventricular premature depolarization: Secondary | ICD-10-CM | POA: Insufficient documentation

## 2017-10-29 DIAGNOSIS — J449 Chronic obstructive pulmonary disease, unspecified: Secondary | ICD-10-CM | POA: Insufficient documentation

## 2017-10-29 DIAGNOSIS — E785 Hyperlipidemia, unspecified: Secondary | ICD-10-CM | POA: Insufficient documentation

## 2017-10-29 DIAGNOSIS — I359 Nonrheumatic aortic valve disorder, unspecified: Secondary | ICD-10-CM | POA: Diagnosis not present

## 2017-10-29 DIAGNOSIS — Z720 Tobacco use: Secondary | ICD-10-CM | POA: Insufficient documentation

## 2017-10-29 DIAGNOSIS — G8929 Other chronic pain: Secondary | ICD-10-CM | POA: Diagnosis not present

## 2017-10-29 DIAGNOSIS — I509 Heart failure, unspecified: Secondary | ICD-10-CM | POA: Diagnosis not present

## 2017-10-29 DIAGNOSIS — I082 Rheumatic disorders of both aortic and tricuspid valves: Secondary | ICD-10-CM | POA: Diagnosis not present

## 2017-10-31 ENCOUNTER — Telehealth: Payer: Self-pay | Admitting: Cardiovascular Disease

## 2017-10-31 NOTE — Telephone Encounter (Signed)
New Message    Pt returning call regarding echo results

## 2017-10-31 NOTE — Telephone Encounter (Signed)
I spoke with pt and reviewed echo results.

## 2017-11-24 ENCOUNTER — Other Ambulatory Visit: Payer: Self-pay | Admitting: Family Medicine

## 2017-12-10 ENCOUNTER — Ambulatory Visit (INDEPENDENT_AMBULATORY_CARE_PROVIDER_SITE_OTHER): Payer: Medicare Other | Admitting: Family Medicine

## 2017-12-10 ENCOUNTER — Encounter: Payer: Self-pay | Admitting: Family Medicine

## 2017-12-10 VITALS — BP 120/70 | HR 74 | Temp 98.4°F | Ht 61.0 in | Wt 196.0 lb

## 2017-12-10 DIAGNOSIS — S7002XA Contusion of left hip, initial encounter: Secondary | ICD-10-CM

## 2017-12-10 DIAGNOSIS — S70212A Abrasion, left hip, initial encounter: Secondary | ICD-10-CM | POA: Diagnosis not present

## 2017-12-10 DIAGNOSIS — Z23 Encounter for immunization: Secondary | ICD-10-CM

## 2017-12-10 NOTE — Progress Notes (Signed)
   Subjective:    Patient ID: [name] [name], female    DOB: 23-May-1950, 67 y.o.   MRN: [id number]  HPI Here to check a wound on the left hip from a fall at home 6 days ago. She got out of bed one evening to go to the bathroom and lost her balance (no LOC). She landed on her left side and her hip struck a plastic chest of drawers. She had a large bruise and also a shallow scrape in the skin. They have been dressing it daily with Neosporin and a Bandaid, but they wanted [place] to check it. It is still sore but she can walk on it.    Review of Systems  Constitutional: Negative.   Respiratory: Negative.   Cardiovascular: Negative.   Skin: Positive for wound.  Neurological: Negative.        Objective:   Physical Exam  Constitutional: She is oriented to person, place, and time. She appears well-developed and well-nourished. No distress.  Cardiovascular: Normal rate, regular rhythm, normal heart sounds and intact distal pulses.  Pulmonary/Chest: Effort normal and breath sounds normal.  Neurological: She is alert and oriented to person, place, and time.  Skin:  The left lateral hip has a medium sized ecchymosis  over the greater trochanter and there is a shallow 1 cm abrasion that has some granulation tissue at the base. No signs of infection          Assessment & Plan:  She has a contusion and an abrasion to the left hip that seem to be healing nicely. They will continue to dress the wound daily. She is given a TDaP. Recheck prn. Alysia Penna, MD

## 2017-12-10 NOTE — Addendum Note (Signed)
Addended by: Myriam Forehand on: 12/10/2017 04:07 PM   Modules accepted: Orders

## 2017-12-11 ENCOUNTER — Other Ambulatory Visit: Payer: Self-pay | Admitting: Nurse Practitioner

## 2017-12-18 ENCOUNTER — Telehealth: Payer: Self-pay | Admitting: Family Medicine

## 2017-12-18 ENCOUNTER — Encounter: Payer: Self-pay | Admitting: Family Medicine

## 2017-12-18 ENCOUNTER — Ambulatory Visit (INDEPENDENT_AMBULATORY_CARE_PROVIDER_SITE_OTHER): Payer: Medicare Other | Admitting: Family Medicine

## 2017-12-18 VITALS — BP 130/72 | HR 32 | Temp 98.5°F | Wt 197.1 lb

## 2017-12-18 DIAGNOSIS — S70212D Abrasion, left hip, subsequent encounter: Secondary | ICD-10-CM

## 2017-12-18 DIAGNOSIS — K1370 Unspecified lesions of oral mucosa: Secondary | ICD-10-CM | POA: Diagnosis not present

## 2017-12-18 MED ORDER — MUPIROCIN 2 % EX OINT: 1.0000 "application " | TOPICAL_OINTMENT | Freq: Two times a day (BID) | CUTANEOUS | 2 refills | Status: DC

## 2017-12-18 MED ORDER — DOXYCYCLINE HYCLATE 100 MG PO CAPS: 100.0000 mg | ORAL_CAPSULE | Freq: Two times a day (BID) | ORAL | 0 refills | Status: AC

## 2017-12-18 NOTE — Telephone Encounter (Signed)
Copied from [place] [id number]. Topic: Quick Communication - See Telephone Encounter >> Dec 18, 2017  1:18 PM Gardiner Ramus wrote: CRM for notification. See Telephone encounter for: 12/18/17.mupirocin ointment pt called and stated that directions for mupirocin ointment state to put in her nose. Pt states they did not discuss this. Please advise

## 2017-12-18 NOTE — Progress Notes (Signed)
   Subjective:    Patient ID: [name] [name], female    DOB: 1950-05-29, 67 y.o.   MRN: [id number]  HPI Here to recheck a wound on the left hip from a fall on 12-04-17. She had a bruise and an abrasion. The area is still tender and it oozes a clear fluid. She is dressing it with Neosporin daily.  Also she asks me to check a sore lesion beneath her tongue that appeared one week ago. Since then it has gotten slightly larger.    Review of Systems  Constitutional: Negative.   HENT: Positive for mouth sores.   Respiratory: Negative.   Cardiovascular: Negative.   Skin: Positive for wound.       Objective:   Physical Exam  Constitutional: She appears well-developed and well-nourished.  HENT:  Right Ear: External ear normal.  Left Ear: External ear normal.  Nose: Nose normal.  There is a pink tender mobile cystic lesion at the base of the inferior tongue   Eyes: Conjunctivae are normal.  Neck: No thyromegaly present.  Cardiovascular: Normal rate, regular rhythm, normal heart sounds and intact distal pulses.  Pulmonary/Chest: Effort normal and breath sounds normal.  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 Add Doxycycline for 10 days. The mouth lesion looks like a hypertrophied salivary gland. These usually resolve on their own. We will recheck this on 01-07-18 at her well exam.  Alysia Penna, MD

## 2017-12-18 NOTE — Telephone Encounter (Signed)
Dr. Fry please advise. Thanks  

## 2017-12-19 NOTE — Telephone Encounter (Signed)
[id number] x1 

## 2017-12-19 NOTE — Telephone Encounter (Signed)
Pt returning call and pt given information per notes of Dr. Sarajane Jews on 12/19/17. Pt verbalized understanding.

## 2017-12-19 NOTE — Telephone Encounter (Signed)
Tell her to ignore those directions. She should apply this to her wound BID

## 2017-12-27 ENCOUNTER — Other Ambulatory Visit: Payer: Self-pay | Admitting: Family Medicine

## 2018-01-07 ENCOUNTER — Ambulatory Visit (INDEPENDENT_AMBULATORY_CARE_PROVIDER_SITE_OTHER): Payer: Medicare Other | Admitting: Family Medicine

## 2018-01-07 ENCOUNTER — Encounter: Payer: Self-pay | Admitting: Family Medicine

## 2018-01-07 VITALS — BP 120/88 | HR 66 | Temp 98.6°F | Ht 61.0 in | Wt 193.2 lb

## 2018-01-07 DIAGNOSIS — K219 Gastro-esophageal reflux disease without esophagitis: Secondary | ICD-10-CM | POA: Diagnosis not present

## 2018-01-07 DIAGNOSIS — M159 Polyosteoarthritis, unspecified: Secondary | ICD-10-CM

## 2018-01-07 DIAGNOSIS — M150 Primary generalized (osteo)arthritis: Secondary | ICD-10-CM | POA: Diagnosis not present

## 2018-01-07 DIAGNOSIS — M5441 Lumbago with sciatica, right side: Secondary | ICD-10-CM

## 2018-01-07 DIAGNOSIS — I10 Essential (primary) hypertension: Secondary | ICD-10-CM | POA: Diagnosis not present

## 2018-01-07 DIAGNOSIS — E782 Mixed hyperlipidemia: Secondary | ICD-10-CM

## 2018-01-07 DIAGNOSIS — Z23 Encounter for immunization: Secondary | ICD-10-CM | POA: Diagnosis not present

## 2018-01-07 DIAGNOSIS — M5442 Lumbago with sciatica, left side: Secondary | ICD-10-CM

## 2018-01-07 DIAGNOSIS — F321 Major depressive disorder, single episode, moderate: Secondary | ICD-10-CM | POA: Diagnosis not present

## 2018-01-07 DIAGNOSIS — J449 Chronic obstructive pulmonary disease, unspecified: Secondary | ICD-10-CM

## 2018-01-07 DIAGNOSIS — G8929 Other chronic pain: Secondary | ICD-10-CM

## 2018-01-07 LAB — HEPATIC FUNCTION PANEL
ALBUMIN: 4.3 g/dL (ref 3.5–5.2)
ALT: 8 U/L (ref 0–35)
AST: 12 U/L (ref 0–37)
Alkaline Phosphatase: 64 U/L (ref 39–117)
Bilirubin, Direct: 0.1 mg/dL (ref 0.0–0.3)
Total Bilirubin: 0.6 mg/dL (ref 0.2–1.2)
Total Protein: 7 g/dL (ref 6.0–8.3)

## 2018-01-07 LAB — BASIC METABOLIC PANEL
BUN: 20 mg/dL (ref 6–23)
CHLORIDE: 101 meq/L (ref 96–112)
CO2: 30 mEq/L (ref 19–32)
Calcium: 9.4 mg/dL (ref 8.4–10.5)
Creatinine, Ser: 1.34 mg/dL — ABNORMAL HIGH (ref 0.40–1.20)
GFR: 41.69 mL/min — ABNORMAL LOW (ref >60.00)
GLUCOSE: 115 mg/dL — AB (ref 70–99)
POTASSIUM: 4 meq/L (ref 3.5–5.1)
Sodium: 140 mEq/L (ref 135–145)

## 2018-01-07 LAB — POC URINALSYSI DIPSTICK (AUTOMATED)
BILIRUBIN UA: NEGATIVE
Glucose, UA: NEGATIVE
KETONES UA: NEGATIVE
Leukocytes, UA: NEGATIVE
Nitrite, UA: NEGATIVE
Protein, UA: NEGATIVE
RBC UA: NEGATIVE
Spec Grav, UA: 1.015 (ref 1.010–1.025)
Urobilinogen, UA: 0.2 E.U./dL
pH, UA: 6.5 (ref 5.0–8.0)

## 2018-01-07 LAB — CBC WITH DIFFERENTIAL/PLATELET
Basophils Absolute: 0.1 10*3/uL (ref 0.0–0.1)
Basophils Relative: 0.9 % (ref 0.0–3.0)
EOS ABS: 0.1 10*3/uL (ref 0.0–0.7)
Eosinophils Relative: 1.2 % (ref 0.0–5.0)
HCT: 43.5 % (ref 36.0–46.0)
Hemoglobin: 14.6 g/dL (ref 12.0–15.0)
LYMPHS ABS: 2.2 10*3/uL (ref 0.7–4.0)
Lymphocytes Relative: 29.7 % (ref 12.0–46.0)
MCHC: 33.5 g/dL (ref 30.0–36.0)
MCV: 91.2 fl (ref 78.0–100.0)
Monocytes Absolute: 0.4 10*3/uL (ref 0.1–1.0)
Monocytes Relative: 5.4 % (ref 3.0–12.0)
NEUTROS ABS: 4.6 10*3/uL (ref 1.4–7.7)
NEUTROS PCT: 62.8 % (ref 43.0–77.0)
PLATELETS: 288 10*3/uL (ref 150.0–400.0)
RBC: 4.77 Mil/uL (ref 3.87–5.11)
RDW: 13.6 % (ref 11.5–15.5)
WBC: 7.3 10*3/uL (ref 4.0–10.5)

## 2018-01-07 LAB — LIPID PANEL
CHOLESTEROL: 164 mg/dL (ref 0–200)
HDL: 67.5 mg/dL (ref >39.00)
LDL CALC: 74 mg/dL (ref 0–99)
NonHDL: 96.44
TRIGLYCERIDES: 111 mg/dL (ref 0.0–149.0)
Total CHOL/HDL Ratio: 2
VLDL: 22.2 mg/dL (ref 0.0–40.0)

## 2018-01-07 LAB — TSH: TSH: 1.3 u[IU]/mL (ref 0.35–4.50)

## 2018-01-07 MED ORDER — BUPROPION HCL ER (XL) 300 MG PO TB24: 300.0000 mg | ORAL_TABLET | Freq: Every day | ORAL | 11 refills | Status: DC

## 2018-01-07 NOTE — Progress Notes (Signed)
   Subjective:    Patient ID: [name] [name], female    DOB: Mar 03, 1951, 67 y.o.   MRN: [id number]  HPI Here to follow up on issues. Her arthritis and joint pains are stable. Her BP is stable. She thinks her depression has worsened a little though because it is hard for her to get excited about anything and she doesn't feel motivated to do things. Sleep and appetite are intact.    Review of Systems  Constitutional: Negative.   HENT: Negative.   Eyes: Negative.   Respiratory: Negative.   Cardiovascular: Negative.   Gastrointestinal: Negative.   Genitourinary: Negative for decreased urine volume, difficulty urinating, dyspareunia, dysuria, enuresis, flank pain, frequency, hematuria, pelvic pain and urgency.  Musculoskeletal: Positive for arthralgias and back pain.  Skin: Negative.   Neurological: Negative.   Psychiatric/Behavioral: Negative.        Objective:   Physical Exam  Constitutional: She is oriented to person, place, and time. She appears well-developed and well-nourished. No distress.  HENT:  Head: Normocephalic and atraumatic.  Right Ear: External ear normal.  Left Ear: External ear normal.  Nose: Nose normal.  Mouth/Throat: Oropharynx is clear and moist. No oropharyngeal exudate.  Eyes: Pupils are equal, round, and reactive to light. Conjunctivae and EOM are normal. No scleral icterus.  Neck: Normal range of motion. Neck supple. No JVD present. No thyromegaly present.  Cardiovascular: Normal rate, regular rhythm, normal heart sounds and intact distal pulses. Exam reveals no gallop and no friction rub.  No murmur heard. Pulmonary/Chest: Effort normal and breath sounds normal. No respiratory distress. She has no wheezes. She has no rales. She exhibits no tenderness.  Abdominal: Soft. Bowel sounds are normal. She exhibits no distension and no mass. There is no tenderness. There is no rebound and no guarding.  Musculoskeletal: Normal range of motion. She exhibits no edema or  tenderness.  Lymphadenopathy:    She has no cervical adenopathy.  Neurological: She is alert and oriented to person, place, and time. She has normal reflexes. She displays normal reflexes. No cranial nerve deficit. She exhibits normal muscle tone. Coordination normal.  Skin: Skin is warm and dry. No rash noted. No erythema.  Psychiatric: She has a normal mood and affect. Her behavior is normal. Judgment and thought content normal.          Assessment & Plan:  Her arthritis and back pain are stable. HTN is stable. Get fasting labs to check lipids, etc. For the depression we will increase the Wellbutrin XL to 300 mg daily. Her GERD and COPD are stable.  [name] Penna, MD

## 2018-01-23 ENCOUNTER — Encounter: Payer: Self-pay | Admitting: Cardiovascular Disease

## 2018-01-23 ENCOUNTER — Ambulatory Visit: Payer: Medicare Other | Admitting: Cardiovascular Disease

## 2018-01-23 VITALS — BP 124/60 | HR 35 | Ht 61.0 in | Wt 194.8 lb

## 2018-01-23 DIAGNOSIS — I359 Nonrheumatic aortic valve disorder, unspecified: Secondary | ICD-10-CM | POA: Diagnosis not present

## 2018-01-23 DIAGNOSIS — I25119 Atherosclerotic heart disease of native coronary artery with unspecified angina pectoris: Secondary | ICD-10-CM | POA: Diagnosis not present

## 2018-01-23 DIAGNOSIS — I493 Ventricular premature depolarization: Secondary | ICD-10-CM

## 2018-01-23 MED ORDER — METOPROLOL TARTRATE 25 MG PO TABS: 25.0000 mg | ORAL_TABLET | Freq: Two times a day (BID) | ORAL | 3 refills | Status: DC

## 2018-01-23 NOTE — Patient Instructions (Signed)
Medication Instructions:  Your physician has recommended you make the following change in your medication: Decrease lopressor to 25 mg by mouth twice daily.   If you need a refill on your cardiac medications before your next appointment, please call your pharmacy.   Lab work: none If you have labs (blood work) drawn today and your tests are completely normal, you will receive your results only by: Marland Kitchen MyChart Message (if you have MyChart) OR . A paper copy in the mail If you have any lab test that is abnormal or we need to change your treatment, we will call you to review the results.  Testing/Procedures: none  Follow-Up: At [facility], you and your health needs are our priority.  As part of our continuing mission to provide you with exceptional heart care, we have created designated Provider Care Teams.  These Care Teams include your primary Cardiologist (physician) and Advanced Practice Providers (APPs -  Physician Assistants and Nurse Practitioners) who all work together to provide you with the care you need, when you need it. You will need a follow up appointment in 12 months.  Please call our office 2 months in advance to schedule this appointment.  You may see Lauree Chandler, MD or one of the following Advanced Practice Providers on your designated Care Team:   [place], PA-C Melina Copa, PA-C . Ermalinda Barrios, PA-C  Any Other Special Instructions Will Be Listed Below (If Applicable).

## 2018-01-23 NOTE — Progress Notes (Signed)
/   Chief Complaint  Patient presents with  . Follow-up    CAD    History of Present Illness: 67 yo female with history of CAD, HTN, HLD, GERD, OA, depression, ongoing tobacco abuse, COPD, palpitations/PVCs who is here today for cardiac follow up. I saw her in May 2015 for evaluation of dyspnea, weakness. She had been on a beta blocker for years for palpitations. Echo June 2015 showed normal LV size and function, possible bicuspid aortic valve, mildly dilated aortic root. Due to a shadow in the aorta, a CTA chest was arranged to exclude dissection. No dissection noted. Event monitor showed sinus rhythm with PVCs, bigeminy. Inderal changed to metoprolol. Cardiac cath January 2016 showed moderate mid LAD stenosis which was evaluated with FFR (0.82-0.85 suggesting this stenosis was not flow limiting). The Circumflex and RCA had mild disease. I saw her in March 2016 and she c/o palpitations associated with dizziness and weakness, bradycardia. She was seen by Dr. Rayann Heman and was started on Flecainide. Her PVC burden did improve with this but she has not tolerated high doses of Flecainide. She has tolerated metoprolol and low dose Flecainide. PVCs have not been frequent enough to consider ablation. Repeat cardiac cath in September 2017 with 50% mid LAD stenosis, mild disease in the RCA and Circumflex. She had a home sleep study in March 2018 and she was told it was normal. Echo July 2019 with LVEF=50-55% with trivial AI, likely bicuspid AV.   She is here today for follow up. The patient denies any chest pain, dyspnea, palpitations, lower extremity edema, orthopnea, PND, dizziness, near syncope or syncope.   Primary Care Physician: [name] Morale, MD  Past Medical History:  Diagnosis Date  . Allergic rhinitis   . ALLERGIC RHINITIS 05/08/2007   Qualifier: Diagnosis of  By: Sherlynn Stalls, CMA, [place]    . Allergy    seasonal  . Anxiety   . Anxiety state 05/25/2014  . Cataract    bilateral  . Chronic low back  pain 03/09/2016  . Complication of anesthesia 2003   Medication for block "went up to my brain and I stopped breathing"  . COPD (chronic obstructive pulmonary disease) ([place])   . COPD (chronic obstructive pulmonary disease) with chronic bronchitis ([place]) 10/12/2014  . Coronary artery disease involving native coronary artery of native heart with angina pectoris ([place])   . Depression   . Dexamethasone adverse reaction    2002 normal  . Duodenal mass   . Essential hypertension 05/08/2007   Qualifier: Diagnosis of  By: Sherlynn Stalls, CMA, [place]    . GERD 05/08/2007   Qualifier: Diagnosis of  By: Sherlynn Stalls, CMA, [place]    . GERD (gastroesophageal reflux disease)   . Hyperlipemia, mixed 05/08/2007   Qualifier: Diagnosis of  By: Sherlynn Stalls, CMA, [place]    . Hyperlipidemia   . Hypertension   . Insomnia   . Neck pain, chronic 03/09/2016  . Osteoarthritis   . OSTEOARTHRITIS 05/08/2007   Qualifier: Diagnosis of  By: Sherlynn Stalls, CMA, [place]    . Overactive bladder   . Pneumonia    once or twivce in past  . Premature ventricular contraction 07/16/2014  . Premature ventricular contractions    LBBB inferior axis PVCs  . Thoracic back pain 05/30/2014  . Tobacco abuse   . Ulcer    duodenal    Past Surgical History:  Procedure Laterality Date  . CARDIAC CATHETERIZATION N/A 12/03/2015   Procedure: Left Heart Cath and Coronary Angiography;  Surgeon: Annita Brod  Angelena Form, MD;  Location: [place] [facility];  Service: Cardiovascular;  Laterality: N/A;  . CARPAL TUNNEL RELEASE Left    1997 left,    . CHOLECYSTECTOMY N/A 03/23/2015   Procedure: LAPAROSCOPIC CHOLECYSTECTOMY;  Surgeon: Ralene Ok, MD;  Location: [place];  Service: General;  Laterality: N/A;  . COLONOSCOPY  10-17-13   per Dr. Olevia Perches, benign polyps, repeat in 10 years  . EUS N/A 11/12/2014   Procedure: UPPER ENDOSCOPIC ULTRASOUND (EUS) LINEAR;  Surgeon: Milus Banister, MD;  Location: [facility];  Service: Endoscopy;  Laterality: N/A;  . EUS N/A 06/08/2016    Procedure: UPPER ENDOSCOPIC ULTRASOUND (EUS) RADIAL;  Surgeon: Milus Banister, MD;  Location: [facility];  Service: Endoscopy;  Laterality: N/A;  . LEFT AND RIGHT HEART CATHETERIZATION WITH CORONARY ANGIOGRAM N/A 04/22/2014   Procedure: LEFT AND RIGHT HEART CATHETERIZATION WITH CORONARY ANGIOGRAM;  Surgeon: Burnell Blanks, MD;  Location: [facility];  Service: Cardiovascular;  Laterality: N/A;  . TONSILLECTOMY  1950  . torn cartilage repaired rt wrist Right 2003    Current Outpatient Medications  Medication Sig Dispense Refill  . ALPRAZolam (XANAX) 0.25 MG tablet TAKE 3 TABLETS BY MOUTH EVERY 6 HOURS AS NEEDED 360 tablet 1  . aspirin EC 81 MG tablet Take 1 tablet (81 mg total) by mouth daily. 90 tablet 3  . atorvastatin (LIPITOR) 20 MG tablet TAKE 1 TABLET (20 MG TOTAL) BY MOUTH DAILY. 90 tablet 3  . buPROPion (WELLBUTRIN XL) 300 MG 24 hr tablet Take 1 tablet (300 mg total) by mouth daily. 30 tablet 11  . cetirizine (ZYRTEC) 10 MG tablet Take 10 mg by mouth daily as needed for allergies.     . flecainide (TAMBOCOR) 50 MG tablet Take 1 tablet (50 mg total) by mouth 3 (three) times daily. 270 tablet 3  . hydrochlorothiazide (HYDRODIURIL) 25 MG tablet TAKE 1 TABLET BY MOUTH EVERY DAY 90 tablet 2  . HYDROcodone-acetaminophen (NORCO) 10-325 MG tablet Take 1 tablet by mouth every 6 (six) hours as needed.    Marland Kitchen lisinopril (PRINIVIL,ZESTRIL) 40 MG tablet TAKE 1 TABLET BY MOUTH EVERY DAY 90 tablet 3  . meloxicam (MOBIC) 15 MG tablet TAKE 1 TABLET BY MOUTH EVERY DAY 30 tablet 11  . mupirocin ointment (BACTROBAN) 2 % Place 1 application into the nose 2 (two) times daily. 30 g 2  . nitroGLYCERIN (NITROSTAT) 0.4 MG SL tablet PLACE 1 TABLET (0.4 MG TOTAL) UNDER THE TONGUE EVERY 5 (FIVE) MINUTES AS NEEDED FOR CHEST PAIN. 25 tablet 5  . pantoprazole (PROTONIX) 40 MG tablet TAKE 1 TABLET BY MOUTH TWICE A DAY 180 tablet 1  . PROAIR HFA 108 (90 Base) MCG/ACT inhaler INHALE 2 PUFFS INTO THE LUNGS EVERY 6  (SIX) HOURS AS NEEDED FOR WHEEZING. 8.5 Inhaler 1  . metoprolol tartrate (LOPRESSOR) 25 MG tablet Take 1 tablet (25 mg total) by mouth 2 (two) times daily. 180 tablet 3   No current facility-administered medications for this visit.     Allergies  Allergen Reactions  . Augmentin [Amoxicillin-Pot Clavulanate] Nausea And Vomiting  . Clindamycin/Lincomycin Nausea And Vomiting  . Oxycodone-Acetaminophen Nausea And Vomiting    Social History   Socioeconomic History  . Marital status: Single    Spouse name: Not on file  . Number of children: 0  . Years of education: Not on file  . Highest education level: Not on file  Occupational History  . Occupation: CNA  Social Needs  . Financial resource strain: Not on  file  . Food insecurity:    Worry: Not on file    Inability: Not on file  . Transportation needs:    Medical: Not on file    Non-medical: Not on file  Tobacco Use  . Smoking status: Former Smoker    Packs/day: 0.45    Years: 54.00    Pack years: 24.30    Types: Cigarettes    Last attempt to quit: 01/01/2017    Years since quitting: 1.0  . Smokeless tobacco: Never Used  . Tobacco comment: 1/2 pack per day or less  Substance and Sexual Activity  . Alcohol use: No    Alcohol/week: 0.0 standard drinks  . Drug use: No  . Sexual activity: Not on file  Lifestyle  . Physical activity:    Days per week: Not on file    Minutes per session: Not on file  . Stress: Not on file  Relationships  . Social connections:    Talks on phone: Not on file    Gets together: Not on file    Attends religious service: Not on file    Active member of club or organization: Not on file    Attends meetings of clubs or organizations: Not on file    Relationship status: Not on file  . Intimate partner violence:    Fear of current or ex partner: Not on file    Emotionally abused: Not on file    Physically abused: Not on file    Forced sexual activity: Not on file  Other Topics Concern  . Not  on file  Social History Narrative   Works as Quarry manager at [employer].    Family History  Problem Relation Age of Onset  . CAD Brother   . Kidney disease Brother   . Emphysema Mother   . Other Father        brain tumor  . Throat cancer Brother   . Hyperlipidemia Unknown   . Hypertension Unknown   . Kidney disease Unknown   . Heart disease Unknown   . Colon cancer Neg Hx   . Esophageal cancer Neg Hx   . Stomach cancer Neg Hx   . Rectal cancer Neg Hx     Review of Systems:  As stated in the HPI and otherwise negative.   BP 124/60   Pulse (!) 35   Ht 5\' 1"  (1.549 m)   Wt 194 lb 12.8 oz (88.4 kg)   SpO2 97%   BMI 36.81 kg/m   Physical Examination:  General: Well developed, well nourished, NAD  HEENT: OP clear, mucus membranes moist  SKIN: warm, dry. No rashes. Neuro: No focal deficits  Musculoskeletal: Muscle strength 5/5 all ext  Psychiatric: Mood and affect normal  Neck: No JVD, no carotid bruits, no thyromegaly, no lymphadenopathy.  Lungs:Clear bilaterally, no wheezes, rhonci, crackles Cardiovascular: Regular rate and rhythm. No murmurs, gallops or rubs. Abdomen:Soft. Bowel sounds present. Non-tender.  Extremities: No lower extremity edema. Pulses are 2 + in the bilateral DP/PT.  Echo 09/03/13: Left ventricle: The cavity size was normal. Systolic function was normal. The estimated ejection fraction was in the range of 60% to 65%. Wall motion was normal; there were no regional wall motion abnormalities. - Aortic valve: Possibly bicuspid; normal thickness leaflets. - Aorta: Dilated aortic root and ascending aorta measuring 40 mm. - Mitral valve: Structurally normal valve. There was no regurgitation. - Left atrium: The atrium was normal in size. - Right ventricle: The cavity size was  normal. Wall thickness was normal. - Right atrium: The atrium was normal in size. - Tricuspid valve: Structurally normal valve. There was no regurgitation. - Pulmonic valve:  Structurally normal valve. - Inferior vena cava: The vessel was normal in size. - Pericardium, extracardiac: There was no pericardial effusion. Impressions: - Dilated aortic root and ascending aorta measuring 40 mm. There is possible dissection flap in the ascending aorta that needs to be further evaluated by either TEE or CTA.  EKG:  EKG is not ordered today. The ekg ordered today demonstrates   Recent Labs: 01/07/2018: ALT 8; BUN 20; Creatinine, Ser 1.34; Hemoglobin 14.6; Platelets 288.0; Potassium 4.0; Sodium 140; TSH 1.30   Lipid Panel    Component Value Date/Time   CHOL 164 01/07/2018 1414   TRIG 111.0 01/07/2018 1414   HDL 67.50 01/07/2018 1414   CHOLHDL 2 01/07/2018 1414   VLDL 22.2 01/07/2018 1414   LDLCALC 74 01/07/2018 1414   LDLDIRECT 130.9 11/16/2008 1013     Wt Readings from Last 3 Encounters:  01/23/18 194 lb 12.8 oz (88.4 kg)  01/07/18 193 lb 3 oz (87.6 kg)  12/18/17 197 lb 2 oz (89.4 kg)     Other studies Reviewed: Additional studies/ records that were reviewed today include: . Review of the above records demonstrates:    Assessment and Plan:   1. PVCs: Rare palpitations. Will continue Flecainide and metoprolol.  Given her bradycardia, will lower metoprolol dosing to 25 mg po BID.   2. CAD with stable angina: She is known to have moderate disease in the LAD, not felt to be flow limiting by cath in September 2017. No chest pain. Continue ASA, statin and beta blocker.    3. Tobacco abuse, in remission: She stopped smoking in 2018.  4. Bicuspid aortic valve: No stenosis by echo in July 2019.   Current medicines are reviewed at length with the patient today.  The patient does not have concerns regarding medicines.  The following changes have been made:    Labs/ tests ordered today include:   No orders of the defined types were placed in this encounter.   Disposition:   FU with me in 12 months  Signed, Lauree Chandler, MD 01/23/2018 2:51 PM     [facility] [place], [place], [place]  [address] Phone: [phone]; Fax: [phone]

## 2018-01-25 ENCOUNTER — Other Ambulatory Visit: Payer: Self-pay | Admitting: Family Medicine

## 2018-01-25 ENCOUNTER — Telehealth: Payer: Self-pay | Admitting: Family Medicine

## 2018-01-25 DIAGNOSIS — Z1231 Encounter for screening mammogram for malignant neoplasm of breast: Secondary | ICD-10-CM

## 2018-01-25 NOTE — Telephone Encounter (Signed)
Copied from [place] [id number]. Topic: Quick Communication - See Telephone Encounter >> Jan 25, 2018  4:17 PM Blase Mess A wrote: CRM for notification. See Telephone encounter for: 01/25/18.  Patient is call for lab results please call back [phone] (M

## 2018-01-29 ENCOUNTER — Other Ambulatory Visit: Payer: Self-pay | Admitting: Family Medicine

## 2018-01-29 DIAGNOSIS — I10 Essential (primary) hypertension: Secondary | ICD-10-CM

## 2018-01-29 NOTE — Telephone Encounter (Signed)
Pt is aware of results of labs.

## 2018-02-01 ENCOUNTER — Other Ambulatory Visit: Payer: Self-pay | Admitting: Family Medicine

## 2018-02-04 ENCOUNTER — Ambulatory Visit: Payer: Self-pay

## 2018-02-04 NOTE — Telephone Encounter (Signed)
Returned phone call to pt.  Stated she rec'd a Flu and Pneumonia vaccine on 01/07/18, one in each arm.  Reported that the right arm is more tender than the left arm.  Stated the right arm is more painful if she tries to lift or do anything that causes muscular strain or stretching.  Reported there is a tenderness, but denied redness or swelling at the injection sites.  Denied fever/ chills.  Denied any numbness or tingling of the arms or hands.  Stated if she lifts the right arm above her head, she can tell her fingertips get cool.  Denied any change in grip strength.  Appt. scheduled with PCP 02/06/18 @ 3:00 PM, for eval. of symptoms.  Care advice given per protocol.  Verb. Understanding.  Agrees with plan.          Reason for Disposition . [1] Pain, tenderness, or swelling at the injection site AND [2] persists > 3 days  Answer Assessment - Initial Assessment Questions 1. SYMPTOMS: "What is the main symptom?" (e.g., redness, swelling, pain)      Denied redness, swelling at site  2. ONSET: "When was the vaccine (shot) given?" "How much later did the symptoms begin?" (e.g., hours, days ago)     It started on my way home  3. SEVERITY: "How bad is it?"      Right and left arm varies in pain ; it hurts with activity of using the muscle 4. FEVER: "Is there a fever?" If so, ask: "What is it, how was it measured, and when did it start?"     Denied fever 5. IMMUNIZATIONS GIVEN: "What shots have you recently received?"     Yes- Flu shot and Pneumonia shot  6. PAST REACTIONS: "Have you reacted to immunizations before?" If so, ask: "What happened?"     Denied previous reactions to immunizations 7. OTHER SYMPTOMS: "Do you have any other symptoms?"     Denied numbness or tingling; reported if has right arm is elevated, her fingertips get cool. Grip strength is unchanged  Protocols used: IMMUNIZATION REACTIONS-A-AH  Summary: soreness in arms    Pt called back, returning call, all nurses on the other line.   ----- Message from Bea Graff, NT sent at 02/04/2018 12:38 PM EST ----- Pt states she had the flu shot in her right arm and the pneumonia shot in her left arm on 10/7. She states that she is still having soreness in both arms, but the right is worse than the left. She would like to see what she can do for the pain.

## 2018-02-06 ENCOUNTER — Ambulatory Visit (INDEPENDENT_AMBULATORY_CARE_PROVIDER_SITE_OTHER): Payer: Medicare Other | Admitting: Family Medicine

## 2018-02-06 ENCOUNTER — Encounter: Payer: Self-pay | Admitting: Family Medicine

## 2018-02-06 VITALS — BP 120/60 | HR 69 | Temp 98.5°F | Wt 195.0 lb

## 2018-02-06 DIAGNOSIS — M7551 Bursitis of right shoulder: Secondary | ICD-10-CM | POA: Diagnosis not present

## 2018-02-06 MED ORDER — METHYLPREDNISOLONE 4 MG PO TBPK: ORAL_TABLET | ORAL | 0 refills | Status: DC

## 2018-02-06 NOTE — Progress Notes (Signed)
   Subjective:    Patient ID: [name] [name], female    DOB: Nov 20, 1950, 67 y.o.   MRN: [id number]  HPI Here for continued mild pain in the right upper arm after she received a flu shot in this area a month ago. It never swelled or turned red or warm, but she has an achy pain. Using her Meloxicam and ice.    Review of Systems  Constitutional: Negative.   Respiratory: Negative.   Cardiovascular: Negative.   Musculoskeletal: Positive for myalgias.  Neurological: Negative.        Objective:   Physical Exam  Constitutional: She is oriented to person, place, and time. She appears well-developed and well-nourished.  Cardiovascular: Normal rate, regular rhythm, normal heart sounds and intact distal pulses.  Pulmonary/Chest: Effort normal and breath sounds normal.  Musculoskeletal:  The right upper arm appears normal with no swelling or erythema or warmth. She is tender over the deltoid bursa   Neurological: She is alert and oriented to person, place, and time.          Assessment & Plan:  Deltoid bursitis as the result of in injection. Given a Medrol dose pack and she can apply moist heat.  Alysia Penna, MD

## 2018-02-14 ENCOUNTER — Ambulatory Visit
Admission: RE | Admit: 2018-02-14 | Discharge: 2018-02-14 | Disposition: A | Discharge status: Home / Self Care | Payer: Medicare Other | Source: Ambulatory Visit | Attending: Family Medicine | Admitting: Family Medicine

## 2018-02-14 DIAGNOSIS — Z1231 Encounter for screening mammogram for malignant neoplasm of breast: Secondary | ICD-10-CM

## 2018-02-20 ENCOUNTER — Telehealth: Payer: Self-pay | Admitting: Family Medicine

## 2018-02-20 DIAGNOSIS — M79601 Pain in right arm: Secondary | ICD-10-CM

## 2018-02-20 NOTE — Telephone Encounter (Signed)
Copied from [place] [id number]. Topic: General - Inquiry >> Feb 20, 2018  3:52 PM Margot Ables wrote: Reason for CRM: pt came in for OV with Dr. Sarajane Jews 02/06/18 and was given methylPREDNISolone for right arm pain. Pt states no improvement at all. Pt asking for next steps or another round of medication. Please advise.

## 2018-02-22 NOTE — Telephone Encounter (Signed)
Dr. Fry please advise. Thanks  

## 2018-02-23 NOTE — Telephone Encounter (Signed)
She may benefit from a steroid injection. I suggest she see an Orthopedist. Is there someone she would like me to refer her to?

## 2018-02-25 ENCOUNTER — Ambulatory Visit: Payer: Self-pay | Admitting: *Deleted

## 2018-02-25 NOTE — Telephone Encounter (Signed)
I called and spoke with her roommate and she stated that the pt just went to the store.  I will call her back later today.

## 2018-02-25 NOTE — Telephone Encounter (Signed)
Dr. Fry please advise. Thanks  

## 2018-02-25 NOTE — Telephone Encounter (Signed)
Contacted pt regarding symptoms; she states that  she would like to know if there is something that can be called in for pain for the bursitis; she says that she has taken the prednisone and meloxicam has helped; she rates her pain at 8 out of 10; the pt states that she needs a bigger dose of prednisone; nurse triage initiated; spoke with Misty at [facility] pt's request; she requests that this be information be sent to the office for provider review; the pt was seen by Dr Sarajane Jews 02/06/18 and can be contacted at [phone].  Reason for Disposition . [1] SEVERE pain AND [2] not improved 2 hours after pain medicine  Answer Assessment - Initial Assessment Questions 1. ONSET: "When did the pain start?"     Seen in office 02/06/18 2. LOCATION: "Where is the pain located?"     Right deltoid 3. PAIN: "How bad is the pain?" (Scale 1-10; or mild, moderate, severe)   - MILD (1-3): doesn't interfere with normal activities   - MODERATE (4-7): interferes with normal activities (e.g., work or school) or awakens from sleep   - SEVERE (8-10): excruciating pain, unable to do any normal activities, unable to hold a cup of water     8 out of 10 4. WORK OR EXERCISE: "Has there been any recent work or exercise that involved this part of the body?"     no 5. CAUSE: "What do you think is causing the arm pain?"     Happened after getting injection 6. OTHER SYMPTOMS: "Do you have any other symptoms?" (e.g., neck pain, swelling, rash, fever, numbness, weakness)     Hurts to pick anything up 7. PREGNANCY: "Is there any chance you are pregnant?" "When was your last menstrual period?"     no  Protocols used: ARM PAIN-A-AH

## 2018-02-26 MED ORDER — TRAMADOL HCL 50 MG PO TABS: ORAL_TABLET | ORAL | 2 refills | Status: DC

## 2018-02-26 NOTE — Addendum Note (Signed)
Addended by: Elie Confer on: 02/26/2018 02:50 PM   Modules accepted: Orders

## 2018-02-26 NOTE — Telephone Encounter (Signed)
I have called the tramadol to the pharmacy and I have called the pt and lmom for her to call me back.

## 2018-02-26 NOTE — Telephone Encounter (Signed)
Call in Tramadol 50 mg to take 1 or 2 every 6 hours prn pain, #60 with 2 rf

## 2018-02-26 NOTE — Telephone Encounter (Signed)
Pt calling to talk with connie

## 2018-02-26 NOTE — Telephone Encounter (Signed)
Pt would like to see someone at [facility] are better for her but she will take what they have to offer.  Dr. Sarajane Jews please advise. Thanks

## 2018-03-01 NOTE — Telephone Encounter (Signed)
Relation to pt: self  Call back number: [phone]   Reason for call:  Patient checking on the status of [place] referral, patient would like to follow up call when referral is received, please advise

## 2018-03-04 ENCOUNTER — Encounter (INDEPENDENT_AMBULATORY_CARE_PROVIDER_SITE_OTHER): Payer: Self-pay

## 2018-03-04 NOTE — Telephone Encounter (Signed)
The referral was done  

## 2018-03-04 NOTE — Telephone Encounter (Signed)
Noted  

## 2018-03-06 ENCOUNTER — Other Ambulatory Visit: Payer: Self-pay | Admitting: Family Medicine

## 2018-03-14 DIAGNOSIS — M25511 Pain in right shoulder: Secondary | ICD-10-CM | POA: Diagnosis not present

## 2018-03-14 DIAGNOSIS — M7541 Impingement syndrome of right shoulder: Secondary | ICD-10-CM | POA: Diagnosis not present

## 2018-04-04 DIAGNOSIS — M7541 Impingement syndrome of right shoulder: Secondary | ICD-10-CM | POA: Diagnosis not present

## 2018-04-23 ENCOUNTER — Other Ambulatory Visit: Payer: Self-pay | Admitting: Family Medicine

## 2018-04-24 DIAGNOSIS — M25511 Pain in right shoulder: Secondary | ICD-10-CM | POA: Diagnosis not present

## 2018-04-24 DIAGNOSIS — M7541 Impingement syndrome of right shoulder: Secondary | ICD-10-CM | POA: Diagnosis not present

## 2018-04-29 ENCOUNTER — Other Ambulatory Visit: Payer: Self-pay | Admitting: Family Medicine

## 2018-04-30 NOTE — Telephone Encounter (Signed)
Dr. Fry please advise on refill of medication.  Thanks  

## 2018-05-01 ENCOUNTER — Other Ambulatory Visit: Payer: Self-pay | Admitting: Family Medicine

## 2018-05-01 ENCOUNTER — Encounter: Payer: Self-pay | Admitting: Family Medicine

## 2018-05-01 ENCOUNTER — Ambulatory Visit (INDEPENDENT_AMBULATORY_CARE_PROVIDER_SITE_OTHER): Payer: Medicare Other

## 2018-05-01 ENCOUNTER — Other Ambulatory Visit: Payer: Medicare Other

## 2018-05-01 ENCOUNTER — Ambulatory Visit (INDEPENDENT_AMBULATORY_CARE_PROVIDER_SITE_OTHER): Payer: Medicare Other | Admitting: Family Medicine

## 2018-05-01 VITALS — BP 124/80 | HR 68 | Temp 98.4°F | Wt 190.0 lb

## 2018-05-01 DIAGNOSIS — M79621 Pain in right upper arm: Secondary | ICD-10-CM

## 2018-05-01 DIAGNOSIS — M5441 Lumbago with sciatica, right side: Secondary | ICD-10-CM | POA: Diagnosis not present

## 2018-05-01 DIAGNOSIS — M5442 Lumbago with sciatica, left side: Secondary | ICD-10-CM

## 2018-05-01 DIAGNOSIS — I10 Essential (primary) hypertension: Secondary | ICD-10-CM | POA: Diagnosis not present

## 2018-05-01 DIAGNOSIS — F1190 Opioid use, unspecified, uncomplicated: Secondary | ICD-10-CM

## 2018-05-01 DIAGNOSIS — M19011 Primary osteoarthritis, right shoulder: Secondary | ICD-10-CM | POA: Diagnosis not present

## 2018-05-01 DIAGNOSIS — G8929 Other chronic pain: Secondary | ICD-10-CM

## 2018-05-01 LAB — BASIC METABOLIC PANEL
BUN: 23 mg/dL (ref 6–23)
CO2: 26 mEq/L (ref 19–32)
Calcium: 9 mg/dL (ref 8.4–10.5)
Chloride: 102 mEq/L (ref 96–112)
Creatinine, Ser: 1.25 mg/dL — ABNORMAL HIGH (ref 0.40–1.20)
GFR: 42.46 mL/min — ABNORMAL LOW (ref >60.00)
Glucose, Bld: 98 mg/dL (ref 70–99)
POTASSIUM: 4.1 meq/L (ref 3.5–5.1)
Sodium: 138 mEq/L (ref 135–145)

## 2018-05-01 MED ORDER — HYDROCODONE-ACETAMINOPHEN 10-325 MG PO TABS: 1.0000 | ORAL_TABLET | Freq: Four times a day (QID) | ORAL | 0 refills | Status: DC | PRN

## 2018-05-01 MED ORDER — HYDROCODONE-ACETAMINOPHEN 10-325 MG PO TABS: 1.0000 | ORAL_TABLET | Freq: Four times a day (QID) | ORAL | 0 refills | Status: AC | PRN

## 2018-05-01 NOTE — Telephone Encounter (Signed)
Pt is aware that this rx was called to the pharmacy.  I did leave the refill request on the VM.

## 2018-05-01 NOTE — Telephone Encounter (Signed)
Call in #[phone] with one rf

## 2018-05-01 NOTE — Progress Notes (Signed)
   Subjective:    Patient ID: [name] [name], female    DOB: 02-11-1951, 68 y.o.   MRN: [id number]  HPI Here for pain management. She is doing well with the lower back pain, but she is still dealing with pain in the right upper arm and shoulder. She is seeing Dr. Victorino December for this and she is scheduled for a shoulder MRI soon. She has had Xrays of the right shoulder but never the right arm apparently.  Indication for chronic opioid: low back pain  Medication and dose: Norco 10-325  # pills per month: 120 Last UDS date: 06-27-17 Opioid Treatment Agreement signed (Y/N): 06-27-17 Opioid Treatment Agreement last reviewed with patient:  05-01-18 [facility] reviewed this encounter (include red flags):  05-01-18    Review of Systems  Constitutional: Negative.   Respiratory: Negative.   Cardiovascular: Negative.   Musculoskeletal: Positive for arthralgias and back pain.  Neurological: Negative.        Objective:   Physical Exam Constitutional:      Appearance: Normal appearance.  Cardiovascular:     Rate and Rhythm: Normal rate and regular rhythm.     Pulses: Normal pulses.     Heart sounds: Normal heart sounds.  Pulmonary:     Effort: Pulmonary effort is normal.     Breath sounds: Normal breath sounds.  Neurological:     General: No focal deficit present.     Mental Status: She is alert and oriented to person, place, and time.           Assessment & Plan:  Pain management. Refills for Norco were written.  Alysia Penna, MD

## 2018-05-03 DIAGNOSIS — M7541 Impingement syndrome of right shoulder: Secondary | ICD-10-CM | POA: Diagnosis not present

## 2018-05-14 DIAGNOSIS — M7501 Adhesive capsulitis of right shoulder: Secondary | ICD-10-CM | POA: Diagnosis not present

## 2018-06-10 DIAGNOSIS — M25511 Pain in right shoulder: Secondary | ICD-10-CM | POA: Diagnosis not present

## 2018-06-14 ENCOUNTER — Other Ambulatory Visit: Payer: Self-pay | Admitting: Family Medicine

## 2018-10-25 ENCOUNTER — Telehealth: Payer: Self-pay

## 2018-10-25 MED ORDER — HYDROCODONE-ACETAMINOPHEN 10-325 MG PO TABS: 1.0000 | ORAL_TABLET | ORAL | 0 refills | Status: AC | PRN

## 2018-10-25 NOTE — Telephone Encounter (Signed)
Spoke with patients pharmacy, they have been able to fill the patients Rx. Nothing further needed at this time.

## 2018-10-25 NOTE — Telephone Encounter (Signed)
Patient is needing refills.  Last filled 06/30/2018 Last OV 05/01/2018  Ok to fill?

## 2018-10-25 NOTE — Telephone Encounter (Signed)
I sent in for #30. For any more she will need a PMV

## 2018-10-25 NOTE — Telephone Encounter (Signed)
Copied from [place] [phone]. Topic: General - Other >> Oct 25, 2018  1:51 PM Mcneil, Ja-Kwan wrote: Reason for CRM: Tammie with [facility] called to advise that the Rx for HYDROcodone-acetaminophen (NORCO) 10-325 MG tablet can only be filled for 7 days. Informed Tammie that the Rx shows expired. Tammie stated she will disregard request unless she receives a new Rx to fill the medication.

## 2018-11-29 ENCOUNTER — Other Ambulatory Visit: Payer: Self-pay | Admitting: Family Medicine

## 2018-11-29 NOTE — Telephone Encounter (Signed)
Last filled 05/01/2018 Last OV 05/01/2018  Ok to fill?

## 2018-12-08 ENCOUNTER — Other Ambulatory Visit: Payer: Self-pay | Admitting: Family Medicine

## 2018-12-10 ENCOUNTER — Other Ambulatory Visit: Payer: Self-pay | Admitting: Nurse Practitioner

## 2018-12-12 ENCOUNTER — Other Ambulatory Visit: Payer: Self-pay | Admitting: Cardiovascular Disease

## 2018-12-12 ENCOUNTER — Other Ambulatory Visit: Payer: Self-pay | Admitting: Family Medicine

## 2018-12-14 ENCOUNTER — Other Ambulatory Visit: Payer: Self-pay | Admitting: Family Medicine

## 2018-12-14 ENCOUNTER — Other Ambulatory Visit: Payer: Self-pay | Admitting: Nurse Practitioner

## 2019-01-22 ENCOUNTER — Other Ambulatory Visit: Payer: Self-pay | Admitting: Family Medicine

## 2019-01-27 ENCOUNTER — Encounter: Payer: Self-pay | Admitting: Physician Assistant

## 2019-01-27 NOTE — Progress Notes (Signed)
Cardiology Office Note    Date:  01/28/2019   ID:  [name], [name] 1951-01-11, MRN [id number]  PCP:  [name], MD  Cardiologist:  Lauree Chandler, MD  Electrophysiologist:  Thompson Grayer, MD   Chief Complaint: yearly f/u CAD and PVCs  History of Present Illness:   [name] [name] is a 68 y.o. female with history of nonobstructive CAD, PVCs, ascending aorta prominence of 3.7cm in 2015, possible bicuspid aortic valve, HTN, HLD (managed by primary care), GERD, OA, depression, ongoing tobacco abuse, CKD stage III by labs, COPD is here today for cardiac follow up.   She remotely established care in 2015 for evaluation of dyspnea and weakness. She had been on a beta blocker for years for palpitations. Echo June 2015 showed normal LV size and function, possible bicuspid aortic valve, mildly dilated aortic root. Due to a shadow in the aorta, a CTA chest was arranged to exclude dissection. No dissection was noted but had 3.7cm ascending aorta prominence. Event monitor showed sinus rhythm with PVCs with bigeminy. Inderal as changed to metoprolol. Cardiac cath January 2016 showed moderate mid LAD stenosis which was evaluated with FFR (0.82-0.85 suggesting this stenosis was not flow limiting). The Circumflex and RCA had mild disease. She was seen back in 2016 with palpitations, dizziness, and weakness with bradycardia. She was seen by Dr. Rayann Heman and was started on flecainide. Her PVC burden did improve with this. PVCs have not been frequent enough to consider ablation. Repeat cardiac cath in September 2017 showed 50% mid LAD stenosis, mild disease in the RCA and Circumflex. She had a home sleep study in March 2018 and she was told it was normal. Repeat echo 10/2017 showed EF 50-55%, grade 1 DD, mild pulmonary HTN and trileaflet aortic valve, trivial AI although Dr. Camillia Herter last note indicated he felt AV was likely bicuspid upon his personal review. Last labs 04/2018 showed K 4.1, Cr 1.25,  01/2018 TSH wnl, nl LFTs, LDL 74, normal CBC.  She returns for follow-up today feeling well. She has not had any interim change in heart status. She notices her heart "dancing" if she goes longer than 7 hours between flecainide doses, so is faithful with compliance. She reports h/o COPD but in general is not bothered by exertional dyspnea. No chest pain. No syncope, edema, orthopnea. She is pleased with how she is doing. She has not seen PCP for routine follow-up but plans to this fall.    Past Medical History:  Diagnosis Date   Allergic rhinitis    ALLERGIC RHINITIS 05/08/2007   Qualifier: Diagnosis of  By: Sherlynn Stalls, CMA, Cindy     Allergy    seasonal   Anxiety    Anxiety state 05/25/2014   Aortic valve disease    a. ? possible bicuspid AV.   CAD (coronary artery disease)    a. nonobstructive by prior cath   Cataract    bilateral   Chronic low back pain [id number]   Complication of anesthesia 2003   Medication for block "went up to my brain and I stopped breathing"   COPD (chronic obstructive pulmonary disease) ([place])    Depression    Dexamethasone adverse reaction    2002 normal   Dilated aortic root (HCC)    a. mild dilated aortic root on prior echo, ascending aorta prominence on CT 2015.   Duodenal mass    GERD 05/08/2007   Qualifier: Diagnosis of  By: Sherlynn Stalls, CMA, Cindy     Hyperlipidemia  Hypertension    Insomnia    Neck pain, chronic 03/09/2016   Osteoarthritis    Overactive bladder    Pneumonia    once or twivce in past   Premature ventricular contractions    LBBB inferior axis PVCs   Thoracic back pain 05/30/2014   Tobacco abuse    Ulcer    duodenal    Past Surgical History:  Procedure Laterality Date   BREAST BIOPSY     CARDIAC CATHETERIZATION N/A 12/03/2015   Procedure: Left Heart Cath and Coronary Angiography;  Surgeon: Burnell Blanks, MD;  Location: [facility];  Service: Cardiovascular;  Laterality: N/A;   CARPAL  TUNNEL RELEASE Left    1997 left,     CHOLECYSTECTOMY N/A 03/23/2015   Procedure: LAPAROSCOPIC CHOLECYSTECTOMY;  Surgeon: Ralene Ok, MD;  Location: [facility];  Service: General;  Laterality: N/A;   COLONOSCOPY  10-17-13   per Dr. Olevia Perches, benign polyps, repeat in 10 years   EUS N/A 11/12/2014   Procedure: UPPER ENDOSCOPIC ULTRASOUND (EUS) LINEAR;  Surgeon: Milus Banister, MD;  Location: [facility];  Service: Endoscopy;  Laterality: N/A;   EUS N/A 06/08/2016   Procedure: UPPER ENDOSCOPIC ULTRASOUND (EUS) RADIAL;  Surgeon: Milus Banister, MD;  Location: [facility];  Service: Endoscopy;  Laterality: N/A;   LEFT AND RIGHT HEART CATHETERIZATION WITH CORONARY ANGIOGRAM N/A 04/22/2014   Procedure: LEFT AND RIGHT HEART CATHETERIZATION WITH CORONARY ANGIOGRAM;  Surgeon: Burnell Blanks, MD;  Location: [facility];  Service: Cardiovascular;  Laterality: N/A;   TONSILLECTOMY  1950   torn cartilage repaired rt wrist Right 2003    Current Medications: Current Meds  Medication Sig   ALPRAZolam (XANAX) 0.25 MG tablet TAKE 3 TABLETS BY MOUTH EVERY 6 HOURS AS NEEDED   aspirin EC 81 MG tablet Take 1 tablet (81 mg total) by mouth daily.   atorvastatin (LIPITOR) 20 MG tablet TAKE 1 TABLET BY MOUTH EVERY DAY   buPROPion (WELLBUTRIN XL) 300 MG 24 hr tablet TAKE 1 TABLET BY MOUTH EVERY DAY   cetirizine (ZYRTEC) 10 MG tablet Take 10 mg by mouth daily as needed for allergies.    flecainide (TAMBOCOR) 50 MG tablet TAKE 1 TABLET (50 MG TOTAL) BY MOUTH 3 (THREE) TIMES DAILY.   hydrochlorothiazide (HYDRODIURIL) 25 MG tablet TAKE 1 TABLET BY MOUTH EVERY DAY   lisinopril (ZESTRIL) 40 MG tablet TAKE 1 TABLET BY MOUTH EVERY DAY   meloxicam (MOBIC) 15 MG tablet TAKE 1 TABLET BY MOUTH EVERY DAY   metoprolol tartrate (LOPRESSOR) 25 MG tablet Take 1 tablet (25 mg total) by mouth 2 (two) times daily. Pt needs to call and make appt for future refills   mupirocin ointment (BACTROBAN) 2 % Place 1  application into the nose 2 (two) times daily.   nitroGLYCERIN (NITROSTAT) 0.4 MG SL tablet PLACE 1 TABLET (0.4 MG TOTAL) UNDER THE TONGUE EVERY 5 (FIVE) MINUTES AS NEEDED FOR CHEST PAIN.   pantoprazole (PROTONIX) 40 MG tablet TAKE 1 TABLET BY MOUTH TWICE A DAY   PROAIR HFA 108 (90 Base) MCG/ACT inhaler INHALE 2 PUFFS INTO THE LUNGS EVERY 6 (SIX) HOURS AS NEEDED FOR WHEEZING.     Allergies:   Augmentin [amoxicillin-pot clavulanate], Clindamycin/lincomycin, and Oxycodone-acetaminophen   Social History   Socioeconomic History   Marital status: Single    Spouse name: Not on file   Number of children: 0   Years of education: Not on file   Highest education level: Not on file  Occupational History   Occupation: CNA  Scientist, product/process development strain: Not on file   Food insecurity    Worry: Not on file    Inability: Not on file   Transportation needs    Medical: Not on file    Non-medical: Not on file  Tobacco Use   Smoking status: Former Smoker    Packs/day: 0.45    Years: 54.00    Pack years: 24.30    Types: Cigarettes    Quit date: 01/01/2017    Years since quitting: 2.0   Smokeless tobacco: Never Used   Tobacco comment: 1/2 pack per day or less  Substance and Sexual Activity   Alcohol use: No    Alcohol/week: 0.0 standard drinks   Drug use: No   Sexual activity: Not on file  Lifestyle   Physical activity    Days per week: Not on file    Minutes per session: Not on file   Stress: Not on file  Relationships   Social connections    Talks on phone: Not on file    Gets together: Not on file    Attends religious service: Not on file    Active member of club or organization: Not on file    Attends meetings of clubs or organizations: Not on file    Relationship status: Not on file  Other Topics Concern   Not on file  Social History Narrative   Works as Quarry manager at [employer].     Family History:  The patient's family history includes CAD in  her brother; Emphysema in her mother; Heart disease in an other family member; Hyperlipidemia in an other family member; Hypertension in an other family member; Kidney disease in her brother and another family member; Other in her father; Throat cancer in her brother. There is no history of Colon cancer, Esophageal cancer, Stomach cancer, or Rectal cancer.  ROS:   Please see the history of present illness.  All other systems are reviewed and otherwise negative.    EKGs/Labs/Other Studies Reviewed:    Studies reviewed were summarized above.   EKG:  EKG is ordered today, personally reviewed, demonstrating NSR 67bpm first degree AVB, IRBBB, no acute STT changes, QRS 49ms, QTc 462ms  Recent Labs: 05/01/2018: BUN 23; Creatinine, Ser 1.25; Potassium 4.1; Sodium 138  Recent Lipid Panel    Component Value Date/Time   CHOL 164 01/07/2018 1414   TRIG 111.0 01/07/2018 1414   HDL 67.50 01/07/2018 1414   CHOLHDL 2 01/07/2018 1414   VLDL 22.2 01/07/2018 1414   LDLCALC 74 01/07/2018 1414   LDLDIRECT 130.9 11/16/2008 1013    PHYSICAL EXAM:    VS:  BP 118/78    Pulse 67    Ht 5\' 1"  (1.549 m)    Wt 180 lb (81.6 kg)    SpO2 98%    BMI 34.01 kg/m   BMI: Body mass index is 34.01 kg/m.  GEN: Well nourished, well developed WF in no acute distress HEENT: normocephalic, atraumatic Neck: no JVD, carotid bruits, or masses Cardiac: RRR; no murmurs, rubs, or gallops, no edema  Respiratory:  clear to auscultation bilaterally, normal work of breathing GI: soft, nontender, nondistended, + BS MS: no deformity or atrophy Skin: warm and dry, no rash Neuro:  Alert and Oriented x 3, Strength and sensation are intact, follows commands Psych: euthymic mood, full affect  Wt Readings from Last 3 Encounters:  01/28/19 180 lb (81.6 kg)  05/01/18 190 lb (86.2  kg)  02/06/18 195 lb (88.5 kg)     ASSESSMENT & PLAN:   1. Nonobstructive CAD - no recent angina. She will continue ASA, BB, statin. Due for CBC given  ongoing ASA use. Of note, lipids are followed by her PCP. She will have f/u lipids this fall with them. Her appt today here was in the afternoon so she did not come fasting. Primary care may consider titrating her atorvastatin to 40mg  daily given goal LDL of <70. 2. PVCs - doing well on metoprolol and flecainide. Given nonobstructive CAD I will reach out to Dr. Angelena Form to find out if he would prefer her to have ongoing follow-up with EP or if he is comfortable managing in general cardiology setting, as well as what recommendation he would make regarding routine ischemic testing given flecainide use. Will update her electrolytes, TSH and kidney function. She has renal insufficiency so will need to monitor this periodically given that it could affect dose adjustment if her CrCl falls below 36. It was 16ml/min by last assessment. 3. Possible bicuspid aortic valve with ascending aortic prominence/dilated aortic root - her studies in 2015 and 2017 showed enlargement of the aorta. Will reassess by echocardiogram this year to ensure stability. Will also hope to get some clarification on whether aortic valve is definitively trileaflet or bileaflet. Blood pressure is well controlled. 4. Essential HTN - well controlled. Update BMET.  Disposition: F/u with 1 year with Dr. Angelena Form.  Medication Adjustments/Labs and Tests Ordered: Current medicines are reviewed at length with the patient today.  Concerns regarding medicines are outlined above. Medication changes, Labs and Tests ordered today are summarized above and listed in the Patient Instructions accessible in Encounters.   Signed, Charlie Pitter, PA-C  01/28/2019 4:36 PM    [facility] [place], [place], [place]  [address] Phone: [phone]; Fax: [phone]

## 2019-01-28 ENCOUNTER — Encounter: Payer: Self-pay | Admitting: Physician Assistant

## 2019-01-28 ENCOUNTER — Encounter (INDEPENDENT_AMBULATORY_CARE_PROVIDER_SITE_OTHER): Payer: Self-pay

## 2019-01-28 ENCOUNTER — Ambulatory Visit: Payer: Medicare Other | Admitting: Physician Assistant

## 2019-01-28 ENCOUNTER — Other Ambulatory Visit: Payer: Self-pay

## 2019-01-28 VITALS — BP 118/78 | HR 67 | Ht 61.0 in | Wt 180.0 lb

## 2019-01-28 DIAGNOSIS — I359 Nonrheumatic aortic valve disorder, unspecified: Secondary | ICD-10-CM | POA: Diagnosis not present

## 2019-01-28 DIAGNOSIS — I2510 Atherosclerotic heart disease of native coronary artery without angina pectoris: Secondary | ICD-10-CM

## 2019-01-28 DIAGNOSIS — I10 Essential (primary) hypertension: Secondary | ICD-10-CM

## 2019-01-28 DIAGNOSIS — I493 Ventricular premature depolarization: Secondary | ICD-10-CM | POA: Diagnosis not present

## 2019-01-28 DIAGNOSIS — I77810 Thoracic aortic ectasia: Secondary | ICD-10-CM

## 2019-01-28 MED ORDER — NITROGLYCERIN 0.4 MG SL SUBL: 0.4000 mg | SUBLINGUAL_TABLET | SUBLINGUAL | 3 refills | Status: DC | PRN

## 2019-01-28 MED ORDER — FLECAINIDE ACETATE 50 MG PO TABS: 50.0000 mg | ORAL_TABLET | Freq: Three times a day (TID) | ORAL | 3 refills | Status: DC

## 2019-01-28 MED ORDER — METOPROLOL TARTRATE 25 MG PO TABS: 25.0000 mg | ORAL_TABLET | Freq: Two times a day (BID) | ORAL | 3 refills | Status: DC

## 2019-01-28 NOTE — Patient Instructions (Addendum)
Medication Instructions:  Your physician recommends that you continue on your current medications as directed. Please refer to the Current Medication list given to you today.  *If you need a refill on your cardiac medications before your next appointment, please call your pharmacy*  Lab Work: TODAY:  BMET, CBC, MAG, & TSH  If you have labs (blood work) drawn today and your tests are completely normal, you will receive your results only by: Marland Kitchen MyChart Message (if you have MyChart) OR . A paper copy in the mail If you have any lab test that is abnormal or we need to change your treatment, we will call you to review the results.  Testing/Procedures: Your physician has requested that you have an echocardiogram 02/04/2019 ARRIVE AT 11:20 FOR THIS TEST. Echocardiography is a painless test that uses sound waves to create images of your heart. It provides your doctor with information about the size and shape of your heart and how well your heart's chambers and valves are working. This procedure takes approximately one hour. There are no restrictions for this procedure.    Follow-Up: At [facility], you and your health needs are our priority.  As part of our continuing mission to provide you with exceptional heart care, we have created designated Provider Care Teams.  These Care Teams include your primary Cardiologist (physician) and Advanced Practice Providers (APPs -  Physician Assistants and Nurse Practitioners) who all work together to provide you with the care you need, when you need it.  Your next appointment:   12 months  The format for your next appointment:   In Person  Provider:   You may see Lauree Chandler, MD or one of the following Advanced Practice Providers on your designated Care Team:    Melina Copa, PA-C  Ermalinda Barrios, PA-C   Other Instructions  Echocardiogram An echocardiogram is a procedure that uses painless sound waves (ultrasound) to produce an image of the  heart. Images from an echocardiogram can provide important information about:  Signs of coronary artery disease (CAD).  Aneurysm detection. An aneurysm is a weak or damaged part of an artery wall that bulges out from the normal force of blood pumping through the body.  Heart size and shape. Changes in the size or shape of the heart can be associated with certain conditions, including heart failure, aneurysm, and CAD.  Heart muscle function.  Heart valve function.  Signs of a past heart attack.  Fluid buildup around the heart.  Thickening of the heart muscle.  A tumor or infectious growth around the heart valves. Tell a health care provider about:  Any allergies you have.  All medicines you are taking, including vitamins, herbs, eye drops, creams, and over-the-counter medicines.  Any blood disorders you have.  Any surgeries you have had.  Any medical conditions you have.  Whether you are pregnant or may be pregnant. What are the risks? Generally, this is a safe procedure. However, problems may occur, including:  Allergic reaction to dye (contrast) that may be used during the procedure. What happens before the procedure? No specific preparation is needed. You may eat and drink normally. What happens during the procedure?   An IV tube may be inserted into one of your veins.  You may receive contrast through this tube. A contrast is an injection that improves the quality of the pictures from your heart.  A gel will be applied to your chest.  A wand-like tool (transducer) will be moved over your chest.  The gel will help to transmit the sound waves from the transducer.  The sound waves will harmlessly bounce off of your heart to allow the heart images to be captured in real-time motion. The images will be recorded on a computer. The procedure may vary among health care providers and hospitals. What happens after the procedure?  You may return to your normal, everyday  life, including diet, activities, and medicines, unless your health care provider tells you not to do that. Summary  An echocardiogram is a procedure that uses painless sound waves (ultrasound) to produce an image of the heart.  Images from an echocardiogram can provide important information about the size and shape of your heart, heart muscle function, heart valve function, and fluid buildup around your heart.  You do not need to do anything to prepare before this procedure. You may eat and drink normally.  After the echocardiogram is completed, you may return to your normal, everyday life, unless your health care provider tells you not to do that. This information is not intended to replace advice given to you by your health care provider. Make sure you discuss any questions you have with your health care provider. Document Released: 03/17/2000 Document Revised: 07/11/2018 Document Reviewed: 04/22/2016 Elsevier Patient Education  2020 [employer].

## 2019-01-29 ENCOUNTER — Telehealth: Payer: Self-pay | Admitting: Physician Assistant

## 2019-01-29 DIAGNOSIS — R7989 Other specified abnormal findings of blood chemistry: Secondary | ICD-10-CM

## 2019-01-29 LAB — BASIC METABOLIC PANEL
BUN/Creatinine Ratio: 18 (ref 12–28)
BUN: 26 mg/dL (ref 8–27)
CO2: 23 mmol/L (ref 20–29)
Calcium: 9.3 mg/dL (ref 8.7–10.3)
Chloride: 104 mmol/L (ref 96–106)
Creatinine, Ser: 1.48 mg/dL — ABNORMAL HIGH (ref 0.57–1.00)
GFR calc Af Amer: 41 mL/min/{1.73_m2} — ABNORMAL LOW (ref >59)
GFR calc non Af Amer: 36 mL/min/{1.73_m2} — ABNORMAL LOW (ref >59)
Glucose: 109 mg/dL — ABNORMAL HIGH (ref 65–99)
Potassium: 4.8 mmol/L (ref 3.5–5.2)
Sodium: 144 mmol/L (ref 134–144)

## 2019-01-29 LAB — CBC
Hematocrit: 44.9 % (ref 34.0–46.6)
Hemoglobin: 14.6 g/dL (ref 11.1–15.9)
MCH: 30.7 pg (ref 26.6–33.0)
MCHC: 32.5 g/dL (ref 31.5–35.7)
MCV: 94 fL (ref 79–97)
Platelets: 299 10*3/uL (ref 150–450)
RBC: 4.76 x10E6/uL (ref 3.77–5.28)
RDW: 12.8 % (ref 11.7–15.4)
WBC: 8 10*3/uL (ref 3.4–10.8)

## 2019-01-29 LAB — TSH: TSH: 1.5 u[IU]/mL (ref 0.450–4.500)

## 2019-01-29 LAB — MAGNESIUM: Magnesium: 1.7 mg/dL (ref 1.6–2.3)

## 2019-01-29 NOTE — Telephone Encounter (Signed)
    Please let [name] [name] know that Dr. Angelena Form agreed with need for ongoing EP follow-up since he does not traditionally manage flecainide use. I think she was under the impression Dr. Angelena Form had assumed care of everything but he clarified yesterday he'd definitely still want EP to see her since she is on an antiarrhythmic. In 05/2016 Chanetta Marshall recommended EP f/u in 1 year. Can you please arrange? Previously saw Allred/Amber. Can also see Renee or Jonni Sanger. Thanks.  Labs are still pending so can make 1 phone call to patient when those return. Dayna Dunn PA-C

## 2019-02-04 ENCOUNTER — Other Ambulatory Visit: Payer: Self-pay

## 2019-02-04 ENCOUNTER — Ambulatory Visit (HOSPITAL_COMMUNITY): Payer: Medicare Other | Attending: Cardiology

## 2019-02-04 DIAGNOSIS — I77810 Thoracic aortic ectasia: Secondary | ICD-10-CM

## 2019-02-04 DIAGNOSIS — I2510 Atherosclerotic heart disease of native coronary artery without angina pectoris: Secondary | ICD-10-CM

## 2019-02-04 DIAGNOSIS — I359 Nonrheumatic aortic valve disorder, unspecified: Secondary | ICD-10-CM

## 2019-02-04 DIAGNOSIS — I493 Ventricular premature depolarization: Secondary | ICD-10-CM

## 2019-02-04 DIAGNOSIS — I10 Essential (primary) hypertension: Secondary | ICD-10-CM | POA: Diagnosis not present

## 2019-02-06 ENCOUNTER — Telehealth: Payer: Self-pay | Admitting: Cardiovascular Disease

## 2019-02-06 MED ORDER — LISINOPRIL 20 MG PO TABS: 20.0000 mg | ORAL_TABLET | Freq: Every day | ORAL | 3 refills | Status: DC

## 2019-02-06 NOTE — Addendum Note (Signed)
Addended by: Gaetano Net on: 02/06/2019 01:44 PM   Modules accepted: Orders

## 2019-02-06 NOTE — Telephone Encounter (Signed)
Patient returning call, says her phone does not work properly but she will be sitting by it so she does not miss your call.

## 2019-02-06 NOTE — Telephone Encounter (Signed)
Returned call to pt, left a message for pt to call back.  

## 2019-02-06 NOTE — Telephone Encounter (Signed)
Pt has been scheduled to see Oda Kilts, APP with EP  02/14/2019

## 2019-02-07 ENCOUNTER — Other Ambulatory Visit: Payer: Self-pay

## 2019-02-07 ENCOUNTER — Encounter: Payer: Self-pay | Admitting: Family Medicine

## 2019-02-07 ENCOUNTER — Telehealth (INDEPENDENT_AMBULATORY_CARE_PROVIDER_SITE_OTHER): Payer: Medicare Other | Admitting: Family Medicine

## 2019-02-07 VITALS — BP 132/80 | HR 58 | Temp 98.0°F | Wt 180.2 lb

## 2019-02-07 DIAGNOSIS — G8929 Other chronic pain: Secondary | ICD-10-CM | POA: Diagnosis not present

## 2019-02-07 DIAGNOSIS — M5442 Lumbago with sciatica, left side: Secondary | ICD-10-CM

## 2019-02-07 DIAGNOSIS — M5441 Lumbago with sciatica, right side: Secondary | ICD-10-CM | POA: Diagnosis not present

## 2019-02-07 DIAGNOSIS — F1190 Opioid use, unspecified, uncomplicated: Secondary | ICD-10-CM | POA: Diagnosis not present

## 2019-02-07 MED ORDER — HYDROCODONE-ACETAMINOPHEN 10-325 MG PO TABS: 1.0000 | ORAL_TABLET | Freq: Four times a day (QID) | ORAL | 0 refills | Status: AC | PRN

## 2019-02-07 MED ORDER — HYDROCODONE-ACETAMINOPHEN 10-325 MG PO TABS: 1.0000 | ORAL_TABLET | Freq: Four times a day (QID) | ORAL | 0 refills | Status: DC | PRN

## 2019-02-07 MED ORDER — TRIAMCINOLONE ACETONIDE 0.1 % EX CREA: 1.0000 "application " | TOPICAL_CREAM | Freq: Two times a day (BID) | CUTANEOUS | 5 refills | Status: DC

## 2019-02-07 NOTE — Progress Notes (Signed)
Here to resume her pain management program. She had stopped taking Norco and was using Mobic for pain, but her renal function has been slowing down so she was told to stop taking Mobic and any other NSAIDs. She saw Cardiology recently and had labs drawn. Her creatinine is up to 1.48, and her GFR is down to 41. She was referred to Nephrology, and this is pending. Indication for chronic opioid: low back pain  Medication and dose: Norco 10-325 # pills per month: 120 Last UDS date: 02-07-19 Opioid Treatment Agreement signed (Y/N): 06-27-17 Opioid Treatment Agreement last reviewed with patient:  02-07-19 N[facility]SRS reviewed this encounter (include red flags): Yes We will resume using Norco as needed for pain control.

## 2019-02-09 LAB — PAIN MGMT, PROFILE 8 W/CONF, U
6 Acetylmorphine: NEGATIVE ng/mL
Alcohol Metabolites: NEGATIVE ng/mL (ref <500)
Alphahydroxyalprazolam: 156 ng/mL
Alphahydroxymidazolam: NEGATIVE ng/mL
Alphahydroxytriazolam: NEGATIVE ng/mL
Aminoclonazepam: NEGATIVE ng/mL
Amphetamines: NEGATIVE ng/mL
Benzodiazepines: POSITIVE ng/mL
Buprenorphine, Urine: NEGATIVE ng/mL
Cocaine Metabolite: NEGATIVE ng/mL
Creatinine: 119.1 mg/dL
Hydroxyethylflurazepam: NEGATIVE ng/mL
Lorazepam: NEGATIVE ng/mL
MDMA: NEGATIVE ng/mL
Marijuana Metabolite: NEGATIVE ng/mL
Nordiazepam: NEGATIVE ng/mL
Opiates: NEGATIVE ng/mL
Oxazepam: NEGATIVE ng/mL
Oxidant: NEGATIVE ug/mL
Oxycodone: NEGATIVE ng/mL
Temazepam: NEGATIVE ng/mL
pH: 7.6 (ref 4.5–9.0)

## 2019-02-12 NOTE — Progress Notes (Signed)
Virtual Visit via Telephone Note  I connected with the patient on 02/12/19 at 10:45 AM EST by telephone and verified that I am speaking with the correct person using two identifiers. We attempted to connect virtually but we had technical difficulties with the audio and video.     I discussed the limitations, risks, security and privacy concerns of performing an evaluation and management service by telephone and the availability of in person appointments. I also discussed with the patient that there may be a patient responsible charge related to this service. The patient expressed understanding and agreed to proceed.  Location patient: home Location provider: work or home office Participants present for the call: patient, provider Patient did not have a visit in the prior 7 days to address this/these issue(s).   History of Present Illness: See my other note    Observations/Objective: Patient sounds cheerful and well on the phone. I do not appreciate any SOB. Speech and thought processing are grossly intact. Patient reported vitals:  Assessment and Plan: See my other note   Follow Up Instructions:     [id number] [id number] [id number] 11-20 [id number] 21-30 I did not refer this patient for an OV in the next 24 hours for this/these issue(s).  I discussed the assessment and treatment plan with the patient. The patient was provided an opportunity to ask questions and all were answered. The patient agreed with the plan and demonstrated an understanding of the instructions.   The patient was advised to call back or seek an in-person evaluation if the symptoms worsen or if the condition fails to improve as anticipated.  I provided 18 minutes of non-face-to-face time during this encounter.   Alysia Penna, MD

## 2019-02-14 ENCOUNTER — Ambulatory Visit: Payer: Medicare Other | Admitting: Student

## 2019-02-21 ENCOUNTER — Other Ambulatory Visit: Payer: Self-pay | Admitting: Family Medicine

## 2019-02-25 NOTE — Progress Notes (Signed)
PCP:  [name], MD Primary Cardiologist: Lauree Chandler, MD Electrophysiologist: Thompson Grayer, MD   [name] [name] is a 68 y.o. female with past medical history of CAD, PVCs, HTN, and tobacco abuse who presents today for routine electrophysiology followup. They are seen for Dr. Rayann Heman.   Since last being seen in our clinic, the patient reports doing well overll. Last seen 05/24/2016.   She has been doing well overall. She denies lightheadedness or dizziness. She does have occasional palpitations when it is "almost time" for her next flecainide. She denies syncope or near syncope. She has occasional SOB with moderate exertion, but attributes this to her COPD. She has quit smoking for 3 years.   The patient feels that she is tolerating medications without difficulties and is otherwise without complaint today.   Past Medical History:  Diagnosis Date  . Allergic rhinitis   . ALLERGIC RHINITIS 05/08/2007   Qualifier: Diagnosis of  By: Sherlynn Stalls, CMA, [place]    . Allergy    seasonal  . Anxiety   . Anxiety state 05/25/2014  . Aortic valve disease    a. ? possible bicuspid AV.  Marland Kitchen CAD (coronary artery disease)    a. nonobstructive by prior cath  . Cataract    bilateral  . Chronic low back pain 03/09/2016  . Complication of anesthesia 2003   Medication for block "went up to my brain and I stopped breathing"  . COPD (chronic obstructive pulmonary disease) ([place])   . Depression   . Dexamethasone adverse reaction    2002 normal  . Dilated aortic root (HCC)    a. mild dilated aortic root on prior echo, ascending aorta prominence on CT 2015.  . Duodenal mass   . GERD 05/08/2007   Qualifier: Diagnosis of  By: Sherlynn Stalls, CMA, [place]    . Hyperlipidemia   . Hypertension   . Insomnia   . Neck pain, chronic 03/09/2016  . Osteoarthritis   . Overactive bladder   . Pneumonia    once or twivce in past  . Premature ventricular contractions    LBBB inferior axis PVCs  . Thoracic back pain  05/30/2014  . Tobacco abuse   . Ulcer    duodenal   Past Surgical History:  Procedure Laterality Date  . BREAST BIOPSY    . CARDIAC CATHETERIZATION N/A 12/03/2015   Procedure: Left Heart Cath and Coronary Angiography;  Surgeon: Burnell Blanks, MD;  Location: [facility];  Service: Cardiovascular;  Laterality: N/A;  . CARPAL TUNNEL RELEASE Left    1997 left,    . CHOLECYSTECTOMY N/A 03/23/2015   Procedure: LAPAROSCOPIC CHOLECYSTECTOMY;  Surgeon: Ralene Ok, MD;  Location: [facility];  Service: General;  Laterality: N/A;  . COLONOSCOPY  10-17-13   per Dr. Olevia Perches, benign polyps, repeat in 10 years  . EUS N/A 11/12/2014   Procedure: UPPER ENDOSCOPIC ULTRASOUND (EUS) LINEAR;  Surgeon: Milus Banister, MD;  Location: [facility];  Service: Endoscopy;  Laterality: N/A;  . EUS N/A 06/08/2016   Procedure: UPPER ENDOSCOPIC ULTRASOUND (EUS) RADIAL;  Surgeon: Milus Banister, MD;  Location: [facility];  Service: Endoscopy;  Laterality: N/A;  . LEFT AND RIGHT HEART CATHETERIZATION WITH CORONARY ANGIOGRAM N/A 04/22/2014   Procedure: LEFT AND RIGHT HEART CATHETERIZATION WITH CORONARY ANGIOGRAM;  Surgeon: Burnell Blanks, MD;  [facility] CATH LAB;  Service: Cardiovascular;  Laterality: N/A;  . TONSILLECTOMY  1950  . torn cartilage repaired rt wrist Right 2003    Current  Outpatient Medications  Medication Sig Dispense Refill  . ALPRAZolam (XANAX) 0.25 MG tablet TAKE 3 TABLETS BY MOUTH EVERY 6 HOURS AS NEEDED 360 tablet 1  . aspirin EC 81 MG tablet Take 1 tablet (81 mg total) by mouth daily. 90 tablet 3  . atorvastatin (LIPITOR) 20 MG tablet TAKE 1 TABLET BY MOUTH EVERY DAY 90 tablet 3  . buPROPion (WELLBUTRIN XL) 300 MG 24 hr tablet TAKE 1 TABLET BY MOUTH EVERY DAY 90 tablet 3  . cetirizine (ZYRTEC) 10 MG tablet Take 10 mg by mouth daily as needed for allergies.     . flecainide (TAMBOCOR) 50 MG tablet Take 1 tablet (50 mg total) by mouth 3 (three) times daily. 90 tablet 3  .  [START ON 04/09/2019] HYDROcodone-acetaminophen (NORCO) 10-325 MG tablet Take 1 tablet by mouth every 6 (six) hours as needed for severe pain. 120 tablet 0  . lisinopril (ZESTRIL) 20 MG tablet Take 1 tablet (20 mg total) by mouth daily. 90 tablet 3  . metoprolol tartrate (LOPRESSOR) 25 MG tablet Take 1 tablet (25 mg total) by mouth 2 (two) times daily. Pt needs to call and make appt for future refills 180 tablet 3  . mupirocin ointment (BACTROBAN) 2 % Place 1 application into the nose 2 (two) times daily. 30 g 2  . nitroGLYCERIN (NITROSTAT) 0.4 MG SL tablet Place 1 tablet (0.4 mg total) under the tongue every 5 (five) minutes as needed for chest pain. 25 tablet 3  . pantoprazole (PROTONIX) 40 MG tablet TAKE 1 TABLET BY MOUTH TWICE A DAY 180 tablet 0  . PROAIR HFA 108 (90 Base) MCG/ACT inhaler INHALE 2 PUFFS INTO THE LUNGS EVERY 6 (SIX) HOURS AS NEEDED FOR WHEEZING. 8.5 Inhaler 1  . triamcinolone cream (KENALOG) 0.1 % Apply 1 application topically 2 (two) times daily. 45 g 5   No current facility-administered medications for this visit.     Allergies  Allergen Reactions  . Augmentin [Amoxicillin-Pot Clavulanate] Nausea And Vomiting  . Clindamycin/Lincomycin Nausea And Vomiting  . Oxycodone-Acetaminophen Nausea And Vomiting    Social History   Socioeconomic History  . Marital status: Single    Spouse name: Not on file  . Number of children: 0  . Years of education: Not on file  . Highest education level: Not on file  Occupational History  . Occupation: CNA  Social Needs  . Financial resource strain: Not on file  . Food insecurity    Worry: Not on file    Inability: Not on file  . Transportation needs    Medical: Not on file    Non-medical: Not on file  Tobacco Use  . Smoking status: Former Smoker    Packs/day: 0.45    Years: 54.00    Pack years: 24.30    Types: Cigarettes    Quit date: 01/01/2017    Years since quitting: 2.1  . Smokeless tobacco: Never Used  . Tobacco  comment: 1/2 pack per day or less  Substance and Sexual Activity  . Alcohol use: No    Alcohol/week: 0.0 standard drinks  . Drug use: No  . Sexual activity: Not on file  Lifestyle  . Physical activity    Days per week: Not on file    Minutes per session: Not on file  . Stress: Not on file  Relationships  . Social Herbalist on phone: Not on file    Gets together: Not on file    Attends religious service: Not  on file    Active member of club or organization: Not on file    Attends meetings of clubs or organizations: Not on file    Relationship status: Not on file  . Intimate partner violence    Fear of current or ex partner: Not on file    Emotionally abused: Not on file    Physically abused: Not on file    Forced sexual activity: Not on file  Other Topics Concern  . Not on file  Social History Narrative   Works as Quarry manager at [employer].     Review of Systems: General: No chills, fever, night sweats or weight changes  Cardiovascular:  No chest pain, dyspnea on exertion, edema, orthopnea, palpitations, paroxysmal nocturnal dyspnea Dermatological: No rash, lesions or masses Respiratory: No cough, dyspnea Urologic: No hematuria, dysuria Abdominal: No nausea, vomiting, diarrhea, bright red blood per rectum, melena, or hematemesis Neurologic: No visual changes, weakness, changes in mental status All other systems reviewed and are otherwise negative except as noted above.  Physical Exam: Vitals:   02/26/19 1048  BP: (!) 138/102  Pulse: 64  Weight: 184 lb (83.5 kg)  Height: 5\' 1"  (1.549 m)    GEN- The patient is well appearing, alert and oriented x 3 today.   HEENT: normocephalic, atraumatic; sclera clear, conjunctiva pink; hearing intact; oropharynx clear; neck supple, no JVP Lymph- no cervical lymphadenopathy Lungs- Clear to ausculation bilaterally, normal work of breathing.  No wheezes, rales, rhonchi Heart- Regular rate and rhythm, no murmurs, rubs or  gallops, PMI not laterally displaced GI- soft, non-tender, non-distended, bowel sounds present, no hepatosplenomegaly Extremities- no clubbing, cyanosis, or edema; DP/PT/radial pulses 2+ bilaterally MS- no significant deformity or atrophy Skin- warm and dry, no rash or lesion Psych- euthymic mood, full affect Neuro- strength and sensation are intact  EKG is ordered. Personal review of EKG from today shows NSR 64 bpm, PR interval 194 ms  Assessment and Plan:  1. PVCs Continue metoprolol and flecainide 50 mg TID Will start magnesium 400 mg daily for Mg 1.7 at last check.   2. Non obstructive CAD Followed by Dr. Angelena Form. Denies ischemic symptoms  3. HTN Continue current medications  4. Possible bicuspid aortic valve with ascending aortic prominence/dilated aortic root Echo 02/04/2019 LVEF 60-65%, Grade 1 DD. Mild dilation of AA measuring 41 mm.  5. CKD III-IV Most recent Cr 1.4. Continue to follow flecainide/EKG.   Shirley Friar, PA-C  02/26/19 11:08 AM

## 2019-02-25 NOTE — Telephone Encounter (Signed)
Okay for 90 day 

## 2019-02-26 ENCOUNTER — Other Ambulatory Visit: Payer: Self-pay

## 2019-02-26 ENCOUNTER — Ambulatory Visit: Payer: Medicare Other | Admitting: Student

## 2019-02-26 VITALS — BP 138/102 | HR 64 | Ht 61.0 in | Wt 184.0 lb

## 2019-02-26 DIAGNOSIS — R7989 Other specified abnormal findings of blood chemistry: Secondary | ICD-10-CM | POA: Diagnosis not present

## 2019-02-26 DIAGNOSIS — I10 Essential (primary) hypertension: Secondary | ICD-10-CM

## 2019-02-26 DIAGNOSIS — I2510 Atherosclerotic heart disease of native coronary artery without angina pectoris: Secondary | ICD-10-CM

## 2019-02-26 DIAGNOSIS — I493 Ventricular premature depolarization: Secondary | ICD-10-CM

## 2019-02-26 MED ORDER — MAGNESIUM 400 MG PO CAPS: 400.0000 mg | ORAL_CAPSULE | Freq: Every day | ORAL | 3 refills | Status: DC

## 2019-02-26 NOTE — Patient Instructions (Addendum)
Medication Instructions:   START TAKING:  MAGNESIUM 400 MG  ONCE A DAY   *If you need a refill on your cardiac medications before your next appointment, please call your pharmacy*  Lab Work: NONE ORDERED  TODAY   If you have labs (blood work) drawn today and your tests are completely normal, you will receive your results only by: Marland Kitchen MyChart Message (if you have MyChart) OR . A paper copy in the mail If you have any lab test that is abnormal or we need to change your treatment, we will call you to review the results.  Testing/Procedures: NONE ORDERED  TODAY    Follow-Up: At [facility], you and your health needs are our priority.  As part of our continuing mission to provide you with exceptional heart care, we have created designated Provider Care Teams.  These Care Teams include your primary Cardiologist (physician) and Advanced Practice Providers (APPs -  Physician Assistants and Nurse Practitioners) who all work together to provide you with the care you need, when you need it.  Your next appointment:   6 month(s)  The format for your next appointment:   In Person  Provider:   You may seeone of the following Advanced Practice Providers on your designated Care Team:    Chanetta Marshall, NP  Tommye Standard, PA-C  Legrand Como "Jonni Sanger" Chalmers Cater, [place]   Other Instructions

## 2019-03-25 ENCOUNTER — Other Ambulatory Visit: Payer: Self-pay | Admitting: Family Medicine

## 2019-04-22 ENCOUNTER — Other Ambulatory Visit: Payer: Self-pay | Admitting: Physician Assistant

## 2019-05-15 ENCOUNTER — Other Ambulatory Visit: Payer: Self-pay | Admitting: Family Medicine

## 2019-05-15 NOTE — Telephone Encounter (Signed)
Last filled 12/02/2018 Last OV 02/26/2019  Ok to fill?

## 2019-06-21 ENCOUNTER — Other Ambulatory Visit: Payer: Self-pay | Admitting: Family Medicine

## 2019-07-07 DIAGNOSIS — N1832 Chronic kidney disease, stage 3b: Secondary | ICD-10-CM | POA: Diagnosis not present

## 2019-07-07 DIAGNOSIS — I2510 Atherosclerotic heart disease of native coronary artery without angina pectoris: Secondary | ICD-10-CM | POA: Diagnosis not present

## 2019-07-07 DIAGNOSIS — I129 Hypertensive chronic kidney disease with stage 1 through stage 4 chronic kidney disease, or unspecified chronic kidney disease: Secondary | ICD-10-CM | POA: Diagnosis not present

## 2019-07-07 DIAGNOSIS — R3911 Hesitancy of micturition: Secondary | ICD-10-CM | POA: Diagnosis not present

## 2019-07-07 DIAGNOSIS — I5032 Chronic diastolic (congestive) heart failure: Secondary | ICD-10-CM | POA: Diagnosis not present

## 2019-07-09 ENCOUNTER — Other Ambulatory Visit: Payer: Self-pay | Admitting: Nephrology

## 2019-07-09 DIAGNOSIS — N1832 Chronic kidney disease, stage 3b: Secondary | ICD-10-CM

## 2019-07-14 ENCOUNTER — Ambulatory Visit
Admission: RE | Admit: 2019-07-14 | Discharge: 2019-07-14 | Disposition: A | Discharge status: Home / Self Care | Payer: Medicare Other | Source: Ambulatory Visit | Attending: Nephrology | Admitting: Nephrology

## 2019-07-14 DIAGNOSIS — N281 Cyst of kidney, acquired: Secondary | ICD-10-CM | POA: Diagnosis not present

## 2019-07-14 DIAGNOSIS — N1832 Chronic kidney disease, stage 3b: Secondary | ICD-10-CM

## 2019-07-14 DIAGNOSIS — N189 Chronic kidney disease, unspecified: Secondary | ICD-10-CM | POA: Diagnosis not present

## 2019-08-25 NOTE — Progress Notes (Signed)
Cardiology Office Note Date:  08/26/2019  Patient ID:  [name] [name], [name] [name] Dec 22, 1950, MRN [id number] PCP:  [name], MD  Cardiologist:  Dr. Angelena Form EP: Dr. Rayann Heman Nephrologist: Dr. Harrie Jeans   Chief Complaint: 6 mo visit  History of Present Illness: [name] [name] is a 69 y.o. female with history of nonobstructive CAD (Cardiac cath January 2016 showed moderate mid LAD stenosis which was evaluated with FFR (0.82-0.85 suggesting this stenosis was not flow limiting). The Circumflex and RCA had mild disease, >>> Repeat cardiac cath in September 2017 showed 50% mid LAD stenosis, mild disease in the RCA and Circumflex), COPD PVCs, HTN, smoker, there has been some question of bicuspid AV her last echo described as normal in structure, CKD (III-IV).  She comes in today to be seen for Dr. Rayann Heman.  Most recently saw A. Chalmers Cater, [place] for EP service Nov 2020.  She was having some palpitations as it was coming time for her next flecainide dose.  Was kept on her 50mg  TID scheduled, started on mag for 1.7 at her last labs. She saw Dr. Rayann Heman in 2017.  Described her PVCs as likely outflow tract, too infrequent to map, pt declined propafenone.  Continued on flecainide 50mg  BID (TID prn), noting historically intolerant of 100mg  dose.  Follows more regularly with general cards team.   She is doing well, again sometimes she will feel some slight palpitations when about due for her flecainide.  This is not every dayy all day and not bothersome.  No CP.  She has COPD with known baseline SOB/DOE that is unchanged for her.  She denies any exertional CP, no dizzy spells, near syncope or syncope. She has cataracts that need to get taken care of and has mis stepped and fallen.    She saw her nephrologist a couple weeks ago had labs done her metoprolol was increased and her lisinopril reduced.   Past Medical History:  Diagnosis Date  . Allergic rhinitis   . ALLERGIC RHINITIS 05/08/2007   Qualifier:  Diagnosis of  By: Sherlynn Stalls, CMA, [place]    . Allergy    seasonal  . Anxiety   . Anxiety state 05/25/2014  . Aortic valve disease    a. ? possible bicuspid AV.  Marland Kitchen CAD (coronary artery disease)    a. nonobstructive by prior cath  . Cataract    bilateral  . Chronic low back pain 03/09/2016  . Complication of anesthesia 2003   Medication for block "went up to my brain and I stopped breathing"  . COPD (chronic obstructive pulmonary disease) ([place])   . COPD (chronic obstructive pulmonary disease) with chronic bronchitis ([place]) 10/12/2014  . Depression   . Depression, major, single episode, moderate ([place]) 05/08/2007   Qualifier: Diagnosis of  By: Sherlynn Stalls, CMA, [place]    . Dexamethasone adverse reaction    2002 normal  . Dilated aortic root (HCC)    a. mild dilated aortic root on prior echo, ascending aorta prominence on CT 2015.  . Duodenal mass   . GERD 05/08/2007   Qualifier: Diagnosis of  By: Sherlynn Stalls, CMA, [place]    . Hyperlipidemia   . Hypertension   . Insomnia   . Neck pain, chronic 03/09/2016  . NICOTINE ADDICTION 05/08/2007   Qualifier: Diagnosis of  By: Sarajane Jews MD, Ishmael Holter   . Osteoarthritis   . Overactive bladder   . Pneumonia    once or twivce in past  . Premature ventricular contractions    LBBB  inferior axis PVCs  . Thoracic back pain 05/30/2014  . Tobacco abuse   . Ulcer    duodenal    Past Surgical History:  Procedure Laterality Date  . BREAST BIOPSY    . CARDIAC CATHETERIZATION N/A 12/03/2015   Procedure: Left Heart Cath and Coronary Angiography;  Surgeon: Burnell Blanks, MD;  Location: [facility];  Service: Cardiovascular;  Laterality: N/A;  . CARPAL TUNNEL RELEASE Left    1997 left,    . CHOLECYSTECTOMY N/A 03/23/2015   Procedure: LAPAROSCOPIC CHOLECYSTECTOMY;  Surgeon: Ralene Ok, MD;  Location: [facility];  Service: General;  Laterality: N/A;  . COLONOSCOPY  10-17-13   per Dr. Olevia Perches, benign polyps, repeat in 10 years  . EUS N/A 11/12/2014   Procedure: UPPER  ENDOSCOPIC ULTRASOUND (EUS) LINEAR;  Surgeon: Milus Banister, MD;  Location: [facility];  Service: Endoscopy;  Laterality: N/A;  . EUS N/A 06/08/2016   Procedure: UPPER ENDOSCOPIC ULTRASOUND (EUS) RADIAL;  Surgeon: Milus Banister, MD;  Location: [facility];  Service: Endoscopy;  Laterality: N/A;  . LEFT AND RIGHT HEART CATHETERIZATION WITH CORONARY ANGIOGRAM N/A 04/22/2014   Procedure: LEFT AND RIGHT HEART CATHETERIZATION WITH CORONARY ANGIOGRAM;  Surgeon: Burnell Blanks, MD;  Location: [facility] CATH LAB;  Service: Cardiovascular;  Laterality: N/A;  . TONSILLECTOMY  1950  . torn cartilage repaired rt wrist Right 2003    Current Outpatient Medications  Medication Sig Dispense Refill  . ALPRAZolam (XANAX) 0.25 MG tablet TAKE 3 TABLETS BY MOUTH EVERY 6 HOURS AS NEEDED 360 tablet 1  . aspirin EC 81 MG tablet Take 1 tablet (81 mg total) by mouth daily. 90 tablet 3  . atorvastatin (LIPITOR) 20 MG tablet TAKE 1 TABLET BY MOUTH EVERY DAY 90 tablet 0  . buPROPion (WELLBUTRIN XL) 300 MG 24 hr tablet TAKE 1 TABLET BY MOUTH EVERY DAY 90 tablet 3  . cetirizine (ZYRTEC) 10 MG tablet Take 10 mg by mouth daily as needed for allergies.     . flecainide (TAMBOCOR) 50 MG tablet TAKE 1 TABLET BY MOUTH THREE TIMES A DAY 270 tablet 2  . lisinopril (ZESTRIL) 10 MG tablet Take 10 mg by mouth daily.    . Magnesium 400 MG CAPS Take 400 mg by mouth daily. 90 capsule 3  . metoprolol tartrate (LOPRESSOR) 50 MG tablet Take 50 mg by mouth 2 (two) times daily.    . mupirocin ointment (BACTROBAN) 2 % Place 1 application into the nose 2 (two) times daily. 30 g 2  . nitroGLYCERIN (NITROSTAT) 0.4 MG SL tablet Place 1 tablet (0.4 mg total) under the tongue every 5 (five) minutes as needed for chest pain. 25 tablet 3  . pantoprazole (PROTONIX) 40 MG tablet TAKE 1 TABLET BY MOUTH TWICE A DAY 180 tablet 0  . PROAIR HFA 108 (90 Base) MCG/ACT inhaler INHALE 2 PUFFS INTO THE LUNGS EVERY 6 (SIX) HOURS AS NEEDED FOR WHEEZING. 8.5  Inhaler 1  . triamcinolone cream (KENALOG) 0.1 % Apply 1 application topically 2 (two) times daily. 45 g 5   No current facility-administered medications for this visit.    Allergies:   Augmentin [amoxicillin-pot clavulanate], Clindamycin/lincomycin, and Oxycodone-acetaminophen   Social History:  The patient  reports that she quit smoking about 2 years ago. Her smoking use included cigarettes. She has a 24.30 pack-year smoking history. She has never used smokeless tobacco. She reports that she does not drink alcohol or use drugs.   Family History:  The patient's family history  includes CAD in her brother; Emphysema in her mother; Heart disease in an other family member; Hyperlipidemia in an other family member; Hypertension in an other family member; Kidney disease in her brother and another family member; Other in her father; Throat cancer in her brother.  ROS:  Please see the history of present illness. All other systems are reviewed and otherwise negative.   PHYSICAL EXAM:  VS:  BP 136/82   Pulse 60   Ht 5\' 1"  (1.549 m)   Wt 180 lb (81.6 kg)   BMI 34.01 kg/m  BMI: Body mass index is 34.01 kg/m. Well nourished, well developed, in no acute distress  HEENT: normocephalic, atraumatic  Neck: no JVD, carotid bruits or masses Cardiac:   RRR; no significant murmurs, no rubs, or gallops Lungs:   CTA b/l, no wheezing, rhonchi or rales  Abd: soft, nontender MS: no deformity or atrophy Ext:  no edema  Skin: warm and dry, no rash Neuro:  No gross deficits appreciated Psych: euthymic mood, full affect    EKG:  Done today and reviewed by myself shows  SR 60bpm, PR 230ms, QRS 69ms, QTc 425ms   02/04/2019: TTE IMPRESSIONS  1. Left ventricular ejection fraction, by visual estimation, is 60 to  65%. The left ventricle has normal function. There is no left ventricular  hypertrophy.  2. Left ventricular diastolic parameters are consistent with Grade I  diastolic dysfunction (impaired  relaxation).  3. Global right ventricle has normal systolic function.The right  ventricular size is normal. No increase in right ventricular wall  thickness.  4. Left atrial size was normal.  5. Right atrial size was normal.  6. The mitral valve is normal in structure. No evidence of mitral valve  regurgitation. No evidence of mitral stenosis.  7. The tricuspid valve is normal in structure. Tricuspid valve  regurgitation is not demonstrated.  8. The aortic valve is normal in structure. Aortic valve regurgitation is  not visualized. No evidence of aortic valve sclerosis or stenosis.  9. The pulmonic valve was normal in structure. Pulmonic valve  regurgitation is not visualized.  10. There is mild dilatation of the ascending aorta measuring 41 mm.  11. The inferior vena cava is normal in size with greater than 50%  respiratory variability, suggesting right atrial pressure of 3 mmHg.     12/03/2015: LHC  Prox RCA to Mid RCA lesion, 20 %stenosed.  Mid RCA to Dist RCA lesion, 20 %stenosed.  2nd Mrg lesion, 20 %stenosed.  1st Mrg lesion, 20 %stenosed.  Ost LAD to Prox LAD lesion, 10 %stenosed.  Mid LAD-2 lesion, 30 %stenosed.  Mid LAD-1 lesion, 50 %stenosed.  Dist LAD lesion, 30 %stenosed.  The left ventricular systolic function is normal.  LV end diastolic pressure is normal.  The left ventricular ejection fraction is greater than 65% by visual estimate.  There is no mitral valve regurgitation.   1. Mild to moderate non-obstructive CAD 2. Moderate mid LAD stenosis. This is unchanged from last cath in 2016. This does not appear to be flow limiting. (FFR in 2016 of the mid LAD was 0.85). 3. Mild non-obstructive disease in the RCA and Circumflex 4. Normal LV systolic function  Recommendations: Continue medical management of CAD. Smoking cessation recommended.    Recent Labs: 01/28/2019: BUN 26; Creatinine, Ser 1.48; Hemoglobin 14.6; Magnesium 1.7; Platelets 299;  Potassium 4.8; Sodium 144; TSH 1.500  No results found for requested labs within last 8760 hours.   CrCl cannot be calculated (Patient's most  recent lab result is older than the maximum 21 days allowed.).   Wt Readings from Last 3 Encounters:  08/26/19 180 lb (81.6 kg)  02/26/19 184 lb (83.5 kg)  02/07/19 180 lb 3.2 oz (81.7 kg)     Other studies reviewed: Additional studies/records reviewed today include: summarized above   ASSESSMENT AND PLAN:  1. PVCs     Minimal burden, not bothersome to her     flecainide     non-obstructive CAD by cath 2017     No symptoms of angina     Will check her mag level  2. HTN     No changes today   Will get her labs from her nephrologist so we have as well.    Disposition: F/u with Dr. Nobie Putnam in Oct as planned, will ask they repeat her EKG at that time to monitor intervals on flecainide, we will see her for EP in a year, sooner if needed.  Current medicines are reviewed at length with the patient today.  The patient did not have any concerns regarding medicines.  Venetia Night, PA-C 08/26/2019 1:42 PM     [address] [place] [id number] [phone] (office)  [phone] (fax)

## 2019-08-26 ENCOUNTER — Ambulatory Visit: Payer: Medicare Other | Admitting: Physician Assistant

## 2019-08-26 ENCOUNTER — Other Ambulatory Visit: Payer: Self-pay

## 2019-08-26 VITALS — BP 136/82 | HR 60 | Ht 61.0 in | Wt 180.0 lb

## 2019-08-26 DIAGNOSIS — I10 Essential (primary) hypertension: Secondary | ICD-10-CM

## 2019-08-26 DIAGNOSIS — I493 Ventricular premature depolarization: Secondary | ICD-10-CM

## 2019-08-26 DIAGNOSIS — Z5181 Encounter for therapeutic drug level monitoring: Secondary | ICD-10-CM | POA: Diagnosis not present

## 2019-08-26 DIAGNOSIS — Z79899 Other long term (current) drug therapy: Secondary | ICD-10-CM

## 2019-08-26 NOTE — Patient Instructions (Addendum)
Medication Instructions:   Your physician recommends that you continue on your current medications as directed. Please refer to the Current Medication list given to you today.  *If you need a refill on your cardiac medications before your next appointment, please call your pharmacy*   Lab Work: magnersium today    If you have labs (blood work) drawn today and your tests are completely normal, you will receive your results only by: Marland Kitchen MyChart Message (if you have MyChart) OR . A paper copy in the mail If you have any lab test that is abnormal or we need to change your treatment, we will call you to review the results.   Testing/Procedures: NONE ORDERED  TODAY    Follow-Up: At [facility], you and your health needs are our priority.  As part of our continuing mission to provide you with exceptional heart care, we have created designated Provider Care Teams.  These Care Teams include your primary Cardiologist (physician) and Advanced Practice Providers (APPs -  Physician Assistants and Nurse Practitioners) who all work together to provide you with the care you need, when you need it.  We recommend signing up for the patient portal called "MyChart".  Sign up information is provided on this After Visit Summary.  MyChart is used to connect with patients for Virtual Visits (Telemedicine).  Patients are able to view lab/test results, encounter notes, upcoming appointments, etc.  Non-urgent messages can be sent to your provider as well.   To learn more about what you can do with MyChart, go to [email or web address].    Your next appointment:   1 year(s)  The format for your next appointment:   In Person  Provider:   You may see Thompson Grayer, MD or one of the following Advanced Practice Providers on your designated Care Team:    Chanetta Marshall, NP  Tommye Standard, PA-C  Legrand Como "Oda Kilts, [place]    Other Instructions

## 2019-08-27 LAB — MAGNESIUM: Magnesium: 1.7 mg/dL (ref 1.6–2.3)

## 2019-09-03 ENCOUNTER — Other Ambulatory Visit: Payer: Self-pay

## 2019-09-03 ENCOUNTER — Ambulatory Visit (INDEPENDENT_AMBULATORY_CARE_PROVIDER_SITE_OTHER): Payer: Medicare Other | Admitting: Family Medicine

## 2019-09-03 ENCOUNTER — Encounter: Payer: Self-pay | Admitting: Family Medicine

## 2019-09-03 VITALS — BP 124/64 | HR 64 | Temp 98.2°F | Wt 184.0 lb

## 2019-09-03 DIAGNOSIS — M8949 Other hypertrophic osteoarthropathy, multiple sites: Secondary | ICD-10-CM

## 2019-09-03 DIAGNOSIS — R195 Other fecal abnormalities: Secondary | ICD-10-CM

## 2019-09-03 DIAGNOSIS — M159 Polyosteoarthritis, unspecified: Secondary | ICD-10-CM

## 2019-09-03 NOTE — Progress Notes (Signed)
   Subjective:    Patient ID: [name] [name], female    DOB: Jul 19, 1950, 69 y.o.   MRN: [id number]  HPI Here for 2 issues. First she has noticed on 4 occasions over the past week that a whitish object has come out with her stools. There is no pain. She brings one of these objects in today for me to examine. She notes that 3 weeks ago she started taking magnesium capsules at the advice of her cardiologist, and since then she has had looser and more frequent stools. The second issue is stiffness and pain in the right thumb. She cannot take oral NSAIDs due to GI effects.   Review of Systems  Constitutional: Negative.   Respiratory: Negative.   Cardiovascular: Negative.   Gastrointestinal: Negative for abdominal distention, abdominal pain, anal bleeding, blood in stool, constipation, nausea, rectal pain and vomiting.  Musculoskeletal: Positive for arthralgias.       Objective:   Physical Exam Constitutional:      Appearance: Normal appearance.  Cardiovascular:     Rate and Rhythm: Normal rate and regular rhythm.     Pulses: Normal pulses.     Heart sounds: Normal heart sounds.  Pulmonary:     Effort: Pulmonary effort is normal.     Breath sounds: Normal breath sounds.  Abdominal:     General: Abdomen is flat. Bowel sounds are normal. There is no distension.     Palpations: Abdomen is soft. There is no mass.     Tenderness: There is no abdominal tenderness. There is no guarding or rebound.     Hernia: No hernia is present.     Comments: On examination of the object she brought in today it appeared to be the round shell of a medication tablet. This was empty on the inside   Musculoskeletal:     Comments: The right thumb is tender over the IP and MCP joints   Neurological:     Mental Status: She is alert.           Assessment & Plan:  It seems the objects in her stool are the empty shells of the Bupropion XL tablets she takes. Since her stools now have decreased transit times  from the magnesium supplements, the shells do not have time to dissolve the way they used to. I reassured her this is benign and nothing needs to be done. 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  Alysia Penna, MD

## 2019-09-16 ENCOUNTER — Other Ambulatory Visit: Payer: Self-pay | Admitting: Family Medicine

## 2019-10-27 DIAGNOSIS — N1832 Chronic kidney disease, stage 3b: Secondary | ICD-10-CM | POA: Diagnosis not present

## 2019-11-05 DIAGNOSIS — E279 Disorder of adrenal gland, unspecified: Secondary | ICD-10-CM | POA: Diagnosis not present

## 2019-11-05 DIAGNOSIS — I5032 Chronic diastolic (congestive) heart failure: Secondary | ICD-10-CM | POA: Diagnosis not present

## 2019-11-05 DIAGNOSIS — I2510 Atherosclerotic heart disease of native coronary artery without angina pectoris: Secondary | ICD-10-CM | POA: Diagnosis not present

## 2019-11-05 DIAGNOSIS — N1832 Chronic kidney disease, stage 3b: Secondary | ICD-10-CM | POA: Diagnosis not present

## 2019-11-05 DIAGNOSIS — I129 Hypertensive chronic kidney disease with stage 1 through stage 4 chronic kidney disease, or unspecified chronic kidney disease: Secondary | ICD-10-CM | POA: Diagnosis not present

## 2019-11-06 ENCOUNTER — Other Ambulatory Visit: Payer: Self-pay | Admitting: Nephrology

## 2019-11-06 DIAGNOSIS — E279 Disorder of adrenal gland, unspecified: Secondary | ICD-10-CM

## 2019-11-21 ENCOUNTER — Ambulatory Visit (INDEPENDENT_AMBULATORY_CARE_PROVIDER_SITE_OTHER): Payer: Medicare Other | Admitting: Family Medicine

## 2019-11-21 ENCOUNTER — Encounter: Payer: Self-pay | Admitting: Family Medicine

## 2019-11-21 ENCOUNTER — Other Ambulatory Visit: Payer: Self-pay

## 2019-11-21 VITALS — BP 130/64 | HR 62 | Temp 98.3°F | Wt 184.8 lb

## 2019-11-21 DIAGNOSIS — M7711 Lateral epicondylitis, right elbow: Secondary | ICD-10-CM | POA: Diagnosis not present

## 2019-11-21 MED ORDER — PREDNISONE 10 MG PO TABS
ORAL_TABLET | ORAL | 0 refills | Status: DC
Start: 2019-11-21 — End: 2019-12-24

## 2019-11-21 MED ORDER — ALPRAZOLAM 0.25 MG PO TABS: ORAL_TABLET | ORAL | 1 refills | Status: DC

## 2019-11-21 NOTE — Progress Notes (Signed)
   Subjective:    Patient ID: [name] [name], female    DOB: Aug 22, 1950, 69 y.o.   MRN: [id number]  HPI Here for several weeks of swelling and pain in the right arm, especially the elbow and forearm. She cannot take NSIADs due to her renal disease.    Review of Systems  Constitutional: Negative.   Respiratory: Negative.   Cardiovascular: Negative.   Musculoskeletal: Positive for arthralgias and joint swelling.       Objective:   Physical Exam Constitutional:      Appearance: Normal appearance.  Cardiovascular:     Rate and Rhythm: Normal rate and regular rhythm.     Pulses: Normal pulses.     Heart sounds: Normal heart sounds.  Musculoskeletal:     Comments: The right forearm is swollen and tender, especially around the lateral epicondyle. ROM is full. No erythema or warmth   Neurological:     Mental Status: She is alert.           Assessment & Plan:  Epicondylitis, treat with a Prednisone taper.  Alysia Penna, MD

## 2019-11-28 ENCOUNTER — Telehealth: Payer: Self-pay | Admitting: Family Medicine

## 2019-11-28 NOTE — Progress Notes (Signed)
  Chronic Care Management   Outreach Note  11/28/2019 Name: Lucill Mauck MRN: [id number] DOB: 1950-09-29  Referred by: [name], MD Reason for referral : Chronic Care Management   An unsuccessful telephone outreach was attempted today. The patient was referred to the pharmacist for assistance with care management and care coordination.   Follow Up Plan:   Carley Perdue [employer] Scheduler

## 2019-12-01 ENCOUNTER — Ambulatory Visit
Admission: RE | Admit: 2019-12-01 | Discharge: 2019-12-01 | Disposition: A | Discharge status: Home / Self Care | Payer: Medicare Other | Source: Ambulatory Visit | Attending: Nephrology | Admitting: Nephrology

## 2019-12-01 DIAGNOSIS — N189 Chronic kidney disease, unspecified: Secondary | ICD-10-CM | POA: Diagnosis not present

## 2019-12-01 DIAGNOSIS — E279 Disorder of adrenal gland, unspecified: Secondary | ICD-10-CM

## 2019-12-01 DIAGNOSIS — D3502 Benign neoplasm of left adrenal gland: Secondary | ICD-10-CM | POA: Diagnosis not present

## 2019-12-01 DIAGNOSIS — I700 Atherosclerosis of aorta: Secondary | ICD-10-CM | POA: Diagnosis not present

## 2019-12-01 DIAGNOSIS — K5730 Diverticulosis of large intestine without perforation or abscess without bleeding: Secondary | ICD-10-CM | POA: Diagnosis not present

## 2019-12-11 ENCOUNTER — Ambulatory Visit: Payer: Medicare Other

## 2019-12-12 ENCOUNTER — Telehealth: Payer: Self-pay | Admitting: Family Medicine

## 2019-12-12 DIAGNOSIS — I10 Essential (primary) hypertension: Secondary | ICD-10-CM

## 2019-12-12 DIAGNOSIS — J449 Chronic obstructive pulmonary disease, unspecified: Secondary | ICD-10-CM

## 2019-12-12 NOTE — Telephone Encounter (Signed)
-----   Message from Erskin Burnet, [facility] sent at 12/10/2019  7:54 AM EDT ----- Regarding: CCM referral Good morning,  Could you please place a CCM referral for [name] [name]? She has an appointment scheduled with me later this week.  Thank you for your help!  Best, Maddie  Jeni Salles, PharmD Clinical Pharmacist [facility]

## 2019-12-12 NOTE — Telephone Encounter (Signed)
Done

## 2019-12-15 ENCOUNTER — Telehealth: Payer: Self-pay | Admitting: Family Medicine

## 2019-12-15 NOTE — Progress Notes (Signed)
  Chronic Care Management   Outreach Note  12/15/2019 Name: [name] [name] MRN: [id number] DOB: [birth year]  Referred by: [name], MD Reason for referral : No chief complaint on file.   An unsuccessful telephone outreach was attempted today. The patient was referred to the pharmacist for assistance with care management and care coordination.   Follow Up Plan:   Carley Perdue [employer] Scheduler

## 2019-12-16 ENCOUNTER — Telehealth: Payer: Self-pay | Admitting: Family Medicine

## 2019-12-16 NOTE — Progress Notes (Signed)
  Chronic Care Management   Outreach Note  12/16/2019 Name: [name] [name] MRN: [id number] DOB: Aug 09, 1950  Referred by: [name], MD Reason for referral : No chief complaint on file.   A second unsuccessful telephone outreach was attempted today. The patient was referred to pharmacist for assistance with care management and care coordination.  Follow Up Plan:   Carley Perdue [employer] Scheduler

## 2019-12-17 ENCOUNTER — Telehealth: Payer: Self-pay | Admitting: Family Medicine

## 2019-12-17 NOTE — Progress Notes (Signed)
  Chronic Care Management   Outreach Note  12/17/2019 Name: [name] [name] MRN: [id number] DOB: 1951/03/24  Referred by: [name], MD Reason for referral : No chief complaint on file.   An unsuccessful telephone outreach was attempted today. The patient was referred to the pharmacist for assistance with care management and care coordination.   Follow Up Plan:   Carley Perdue [employer] Scheduler

## 2019-12-21 ENCOUNTER — Other Ambulatory Visit: Payer: Self-pay | Admitting: Family Medicine

## 2019-12-24 ENCOUNTER — Other Ambulatory Visit: Payer: Self-pay

## 2019-12-24 ENCOUNTER — Encounter: Payer: Self-pay | Admitting: Family Medicine

## 2019-12-24 ENCOUNTER — Telehealth: Payer: Self-pay | Admitting: Family Medicine

## 2019-12-24 ENCOUNTER — Telehealth (INDEPENDENT_AMBULATORY_CARE_PROVIDER_SITE_OTHER): Payer: Medicare Other | Admitting: Family Medicine

## 2019-12-24 DIAGNOSIS — M79631 Pain in right forearm: Secondary | ICD-10-CM

## 2019-12-24 NOTE — Telephone Encounter (Signed)
error 

## 2019-12-24 NOTE — Progress Notes (Signed)
Patient ID: [name] [name], female   DOB: 22-Jan-1951, 69 y.o.   MRN: [id number]   This visit type was conducted due to national recommendations for restrictions regarding the COVID-19 pandemic in an effort to limit this patient's exposure and mitigate transmission in our community.   Virtual Visit via Telephone Note  I connected with [name] [name] on 12/24/19 at  5:00 PM EDT by telephone and verified that I am speaking with the correct person using two identifiers.   I discussed the limitations, risks, security and privacy concerns of performing an evaluation and management service by telephone and the availability of in person appointments. I also discussed with the patient that there may be a patient responsible charge related to this service. The patient expressed understanding and agreed to proceed.  Location patient: home Location provider: work or home office Participants present for the call: patient, provider Patient did not have a visit in the prior 7 days to address this/these issue(s).   History of Present Illness:  [name] [name] called with some recurrent swelling and pain right arm and wrist region.  She was seen here by her primary in August with similar problem.  She has chronic kidney disease and avoids nonsteroidals.  He was diagnosed with "epicondylitis "and treated with prednisone taper then.  That did seem to help temporarily.  She relates 1 week now history of aching in her right forearm all the way down toward the hand.  Slight warmth and redness.  No known injury.  She states that her finger seems slightly swollen.  She does not have any history of gout or pseudogout.  No other significant arthralgias.  No fevers or chills.  Denies arm swelling.   Past Medical History:  Diagnosis Date  . Allergic rhinitis   . ALLERGIC RHINITIS 05/08/2007   Qualifier: Diagnosis of  By: Sherlynn Stalls, CMA, [place]    . Allergy    seasonal  . Anxiety   . Anxiety state 05/25/2014  . Aortic valve disease     a. ? possible bicuspid AV.  Marland Kitchen CAD (coronary artery disease)    a. nonobstructive by prior cath  . Cataract    bilateral  . Chronic low back pain 03/09/2016  . Complication of anesthesia 2003   Medication for block "went up to my brain and I stopped breathing"  . COPD (chronic obstructive pulmonary disease) ([place])   . COPD (chronic obstructive pulmonary disease) with chronic bronchitis ([place]) 10/12/2014  . Depression   . Depression, major, single episode, moderate ([place]) 05/08/2007   Qualifier: Diagnosis of  By: Sherlynn Stalls, CMA, [place]    . Dexamethasone adverse reaction    2002 normal  . Dilated aortic root (HCC)    a. mild dilated aortic root on prior echo, ascending aorta prominence on CT 2015.  . Duodenal mass   . GERD 05/08/2007   Qualifier: Diagnosis of  By: Sherlynn Stalls, CMA, [place]    . Hyperlipidemia   . Hypertension   . Insomnia   . Neck pain, chronic 03/09/2016  . NICOTINE ADDICTION 05/08/2007   Qualifier: Diagnosis of  By: Sarajane Jews MD, Ishmael Holter   . Osteoarthritis   . Overactive bladder   . Pneumonia    once or twivce in past  . Premature ventricular contractions    LBBB inferior axis PVCs  . Thoracic back pain 05/30/2014  . Tobacco abuse   . Ulcer    duodenal   Past Surgical History:  Procedure Laterality Date  . BREAST BIOPSY    .  CARDIAC CATHETERIZATION N/A 12/03/2015   Procedure: Left Heart Cath and Coronary Angiography;  Surgeon: Burnell Blanks, MD;  Location: [facility];  Service: Cardiovascular;  Laterality: N/A;  . CARPAL TUNNEL RELEASE Left    1997 left,    . CHOLECYSTECTOMY N/A 03/23/2015   Procedure: LAPAROSCOPIC CHOLECYSTECTOMY;  Surgeon: Ralene Ok, MD;  Location: [facility];  Service: General;  Laterality: N/A;  . COLONOSCOPY  10-17-13   per Dr. Olevia Perches, benign polyps, repeat in 10 years  . EUS N/A 11/12/2014   Procedure: UPPER ENDOSCOPIC ULTRASOUND (EUS) LINEAR;  Surgeon: Milus Banister, MD;  Location: [facility];  Service: Endoscopy;  Laterality: N/A;   . EUS N/A 06/08/2016   Procedure: UPPER ENDOSCOPIC ULTRASOUND (EUS) RADIAL;  Surgeon: Milus Banister, MD;  Location: [facility];  Service: Endoscopy;  Laterality: N/A;  . LEFT AND RIGHT HEART CATHETERIZATION WITH CORONARY ANGIOGRAM N/A 04/22/2014   Procedure: LEFT AND RIGHT HEART CATHETERIZATION WITH CORONARY ANGIOGRAM;  Surgeon: Burnell Blanks, MD;  Location: [facility] C[facility];  Service: Cardiovascular;  Laterality: N/A;  . TONSILLECTOMY  1950  . torn cartilage repaired rt wrist Right 2003    reports that she quit smoking about 2 years ago. Her smoking use included cigarettes. She has a 24.30 pack-year smoking history. She has never used smokeless tobacco. She reports that she does not drink alcohol and does not use drugs. family history includes CAD in her brother; Emphysema in her mother; Heart disease in an other family member; Hyperlipidemia in an other family member; Hypertension in an other family member; Kidney disease in her brother and another family member; Other in her father; Throat cancer in her brother. Allergies  Allergen Reactions  . Augmentin [Amoxicillin-Pot Clavulanate] Nausea And Vomiting  . Clindamycin/Lincomycin Nausea And Vomiting  . Oxycodone-Acetaminophen Nausea And Vomiting      Observations/Objective: Patient sounds cheerful and well on the phone. I do not appreciate any SOB. Speech and thought processing are grossly intact. Patient reported vitals:  Assessment and Plan:  Patient relates 1 week history of recurrent pain right upper extremity.  By description this does not sound like epicondylitis as she has visible swelling and fairly diffuse inflammation of the right upper extremity.  She states there is no swelling of the arm but this is all confined to the forearm but does extend all the way down to the digits.  -Feel like she needs office evaluation to help confirm.  This will be set up. -Follow-up immediately for any fever or other concerns.  We will  try to get her scheduled to be seen in office this Friday at 2 PM  Follow Up Instructions:  -As above   [id number] 5-10 [id number] 11-20 [id number] 21-30 I did not refer this patient for an OV in the next 24 hours for this/these issue(s).  I discussed the assessment and treatment plan with the patient. The patient was provided an opportunity to ask questions and all were answered. The patient agreed with the plan and demonstrated an understanding of the instructions.   The patient was advised to call back or seek an in-person evaluation if the symptoms worsen or if the condition fails to improve as anticipated.  I provided 14 minutes of non-face-to-face time during this encounter.   Carolann Littler, MD

## 2019-12-26 ENCOUNTER — Other Ambulatory Visit: Payer: Self-pay

## 2019-12-26 ENCOUNTER — Ambulatory Visit (INDEPENDENT_AMBULATORY_CARE_PROVIDER_SITE_OTHER): Payer: Medicare Other | Admitting: Family Medicine

## 2019-12-26 VITALS — BP 130/90 | HR 67 | Temp 98.4°F | Wt 188.0 lb

## 2019-12-26 DIAGNOSIS — M2550 Pain in unspecified joint: Secondary | ICD-10-CM

## 2019-12-26 DIAGNOSIS — M25531 Pain in right wrist: Secondary | ICD-10-CM | POA: Diagnosis not present

## 2019-12-26 MED ORDER — PREDNISONE 10 MG PO TABS: ORAL_TABLET | ORAL | 0 refills | Status: DC

## 2019-12-26 MED ORDER — PREDNISONE 10 MG PO TABS
ORAL_TABLET | ORAL | 0 refills | Status: DC
Start: 2019-12-26 — End: 2020-02-10

## 2019-12-26 NOTE — Patient Instructions (Signed)
Joint Pain Joint pain (arthralgia) may be caused by many things. Joint pain is likely to go away when you follow instructions from your health care provider for relieving pain at home. However, joint pain can also be caused by conditions that require more treatment. Common causes of joint pain include:  Bruising in the area of the joint.  Injury caused by repeating certain movements too many times (overuse injury).  Age-related joint wear and tear (osteoarthritis).  Buildup of uric acid crystals in the joint (gout).  Inflammation of the joint (rheumatic disease).  Various other forms of arthritis.  Infections of the joint (septic arthritis) or of the bone (osteomyelitis). Your health care provider may recommend that you take pain medicine or wear a supportive device like an elastic bandage, sling, or splint. If your joint pain continues, you may need lab or imaging tests to diagnose the cause of your joint pain. Follow these instructions at home: Managing pain, stiffness, and swelling   If directed, put ice on the painful area. Icing can help to relieve joint pain and swelling. ? Put ice in a plastic bag. ? Place a towel between your skin and the bag. ? Leave the ice on for 20 minutes, 2-3 times a day.  If directed, apply heat to the painful area as often as told by your health care provider. Heat can reduce the stiffness of your muscles and joints. Use the heat source that your health care provider recommends, such as a moist heat pack or a heating pad. ? Place a towel between your skin and the heat source. ? Leave the heat on for 20-30 minutes. ? Remove the heat if your skin turns bright red. This is especially important if you are unable to feel pain, heat, or cold. You may have a greater risk of getting burned.  Move your fingers or toes below the painful joint often. You can avoid stiffness and lessen swelling by doing this.  If possible, raise (elevate) the painful joint above  the level of your heart while you are sitting or lying down. To do this, try putting a few pillows under the painful joint. Activity  Rest the painful joint for as long as directed. Do not do anything that causes or worsens pain.  Begin exercising or stretching the affected area, as told by your health care provider. Ask your health care provider what types of exercise are safe for you. If you have an elastic bandage, sling, or splint:  Wear the supportive device as told by your health care provider. Remove it only as told by your health care provider.  Loosen the device if your fingers or toes below the joint tingle, become numb, or turn cold and blue.  Keep the device clean.  Ask your health care provider if you should remove the device before bathing. You may need to cover it with a watertight covering when you take a bath or a shower. General instructions  Take over-the-counter and prescription medicines only as told by your health care provider.  Do not use any products that contain nicotine or tobacco, such as cigarettes and e-cigarettes. If you need help quitting, ask your health care provider.  Keep all follow-up visits as told by your health care provider. This is important. Contact a health care provider if:  You have pain that gets worse and does not get better with medicine.  Your joint pain does not improve within 3 days.  You have increased bruising or swelling.  You have a fever.  You lose 10 lb (4.5 kg) or more without trying. Get help right away if:  You cannot move the joint.  Your fingers or toes tingle, become numb, or turn cold and blue.  You have a fever along with a joint that is red, warm, and swollen. Summary  Joint pain (arthralgia) may be caused by many things.  Your health care provider may recommend that you take pain medicine or wear a supportive device like an elastic bandage, sling, or splint.  If your joint pain continues, you may need  tests to diagnose the cause of your joint pain.  Take over-the-counter and prescription medicines only as told by your health care provider. This information is not intended to replace advice given to you by your health care provider. Make sure you discuss any questions you have with your health care provider. Document Revised: 03/02/2017 Document Reviewed: 01/03/2017 Elsevier Patient Education  [place].

## 2019-12-26 NOTE — Progress Notes (Signed)
Established Patient Office Visit  Subjective:  Patient ID: [name] [name], female    DOB: 1951-02-23  Age: 69 y.o. MRN: [id number]  CC:  Chief Complaint  Patient presents with  . Joint Swelling    Right wrist    HPI [name] [name] presents for evaluation of right upper extremity pain.  Refer to virtual visit note from a couple of days ago.  She had been seen here in the office by her primary recently diagnosed with lateral epicondylitis.  She did respond to prednisone but now states she has some pain mostly in the wrist and hand with some swelling.  No history of known inflammatory arthritis.  No recent injury.  She especially has severe pain and stiffness early in the mornings.  No history of gout or pseudogout.  She has tried other than prednisone which have not helped.  She avoids nonsteroidals because of chronic kidney disease.  No recent fever.  No rashes.  No lower extremity arthralgias.  Past Medical History:  Diagnosis Date  . Allergic rhinitis   . ALLERGIC RHINITIS 05/08/2007   Qualifier: Diagnosis of  By: Sherlynn Stalls, CMA, [place]    . Allergy    seasonal  . Anxiety   . Anxiety state 05/25/2014  . Aortic valve disease    a. ? possible bicuspid AV.  Marland Kitchen CAD (coronary artery disease)    a. nonobstructive by prior cath  . Cataract    bilateral  . Chronic low back pain 03/09/2016  . Complication of anesthesia 2003   Medication for block "went up to my brain and I stopped breathing"  . COPD (chronic obstructive pulmonary disease) ([place])   . COPD (chronic obstructive pulmonary disease) with chronic bronchitis ([place]) 10/12/2014  . Depression   . Depression, major, single episode, moderate ([place]) 05/08/2007   Qualifier: Diagnosis of  By: Sherlynn Stalls, CMA, [place]    . Dexamethasone adverse reaction    2002 normal  . Dilated aortic root ([facility])    a. mild dilated aortic root on prior echo, ascending aorta prominence on CT 2015.  . Duodenal mass   . GERD 05/08/2007   Qualifier: Diagnosis of   By: Sherlynn Stalls, CMA, [place]    . Hyperlipidemia   . Hypertension   . Insomnia   . Neck pain, chronic 03/09/2016  . NICOTINE ADDICTION 05/08/2007   Qualifier: Diagnosis of  By: Sarajane Jews MD, Ishmael Holter   . Osteoarthritis   . Overactive bladder   . Pneumonia    once or twivce in past  . Premature ventricular contractions    LBBB inferior axis PVCs  . Thoracic back pain 05/30/2014  . Tobacco abuse   . Ulcer    duodenal    Past Surgical History:  Procedure Laterality Date  . BREAST BIOPSY    . CARDIAC CATHETERIZATION N/A 12/03/2015   Procedure: Left Heart Cath and Coronary Angiography;  Surgeon: Burnell Blanks, MD;  Location: [facility];  Service: Cardiovascular;  Laterality: N/A;  . CARPAL TUNNEL RELEASE Left    1997 left,    . CHOLECYSTECTOMY N/A 03/23/2015   Procedure: LAPAROSCOPIC CHOLECYSTECTOMY;  Surgeon: Ralene Ok, MD;  Location: [facility];  Service: General;  Laterality: N/A;  . COLONOSCOPY  10-17-13   per Dr. Olevia Perches, benign polyps, repeat in 10 years  . EUS N/A 11/12/2014   Procedure: UPPER ENDOSCOPIC ULTRASOUND (EUS) LINEAR;  Surgeon: Milus Banister, MD;  Location: [facility];  Service: Endoscopy;  Laterality: N/A;  . EUS N/A 06/08/2016  Procedure: UPPER ENDOSCOPIC ULTRASOUND (EUS) RADIAL;  Surgeon: Milus Banister, MD;  Location: [facility];  Service: Endoscopy;  Laterality: N/A;  . LEFT AND RIGHT HEART CATHETERIZATION WITH CORONARY ANGIOGRAM N/A 04/22/2014   Procedure: LEFT AND RIGHT HEART CATHETERIZATION WITH CORONARY ANGIOGRAM;  Surgeon: Burnell Blanks, MD;  Location: [facility];  Service: Cardiovascular;  Laterality: N/A;  . TONSILLECTOMY  1950  . torn cartilage repaired rt wrist Right 2003    Family History  Problem Relation Age of Onset  . CAD Brother   . Kidney disease Brother   . Emphysema Mother   . Other Father        brain tumor  . Throat cancer Brother   . Hyperlipidemia Other   . Hypertension Other   . Kidney disease Other   . Heart  disease Other   . Colon cancer Neg Hx   . Esophageal cancer Neg Hx   . Stomach cancer Neg Hx   . Rectal cancer Neg Hx     Social History   Socioeconomic History  . Marital status: Single    Spouse name: Not on file  . Number of children: 0  . Years of education: Not on file  . Highest education level: Not on file  Occupational History  . Occupation: CNA  Tobacco Use  . Smoking status: Former Smoker    Packs/day: 0.45    Years: 54.00    Pack years: 24.30    Types: Cigarettes    Quit date: 01/01/2017    Years since quitting: 2.9  . Smokeless tobacco: Never Used  . Tobacco comment: 1/2 pack per day or less  Vaping Use  . Vaping Use: Never used  Substance and Sexual Activity  . Alcohol use: No    Alcohol/week: 0.0 standard drinks  . Drug use: No  . Sexual activity: Not on file  Other Topics Concern  . Not on file  Social History Narrative   Works as Quarry manager at [employer].   Social Determinants of Health   Financial Resource Strain:   . Difficulty of Paying Living Expenses: Not on file  Food Insecurity:   . Worried About Charity fundraiser in the Last Year: Not on file  . Ran Out of Food in the Last Year: Not on file  Transportation Needs:   . Lack of Transportation (Medical): Not on file  . Lack of Transportation (Non-Medical): Not on file  Physical Activity:   . Days of Exercise per Week: Not on file  . Minutes of Exercise per Session: Not on file  Stress:   . Feeling of Stress : Not on file  Social Connections:   . Frequency of Communication with Friends and Family: Not on file  . Frequency of Social Gatherings with Friends and Family: Not on file  . Attends Religious Services: Not on file  . Active Member of Clubs or Organizations: Not on file  . Attends Archivist Meetings: Not on file  . Marital Status: Not on file  Intimate Partner Violence:   . Fear of Current or Ex-Partner: Not on file  . Emotionally Abused: Not on file  . Physically  Abused: Not on file  . Sexually Abused: Not on file    Outpatient Medications Prior to Visit  Medication Sig Dispense Refill  . ALPRAZolam (XANAX) 0.25 MG tablet TAKE 3 TABLETS BY MOUTH EVERY 6 HOURS AS NEEDED 360 tablet 1  . aspirin EC 81 MG tablet Take 1 tablet (81  mg total) by mouth daily. 90 tablet 3  . atorvastatin (LIPITOR) 20 MG tablet TAKE 1 TABLET BY MOUTH EVERY DAY 90 tablet 0  . buPROPion (WELLBUTRIN XL) 300 MG 24 hr tablet TAKE 1 TABLET BY MOUTH EVERY DAY 90 tablet 3  . cetirizine (ZYRTEC) 10 MG tablet Take 10 mg by mouth daily as needed for allergies.     . flecainide (TAMBOCOR) 50 MG tablet TAKE 1 TABLET BY MOUTH THREE TIMES A DAY 270 tablet 2  . lisinopril (ZESTRIL) 10 MG tablet Take 10 mg by mouth daily.    . Magnesium 400 MG CAPS Take 400 mg by mouth daily. 90 capsule 3  . metoprolol tartrate (LOPRESSOR) 50 MG tablet Take 50 mg by mouth 2 (two) times daily.    . mupirocin ointment (BACTROBAN) 2 % Place 1 application into the nose 2 (two) times daily. 30 g 2  . nitroGLYCERIN (NITROSTAT) 0.4 MG SL tablet Place 1 tablet (0.4 mg total) under the tongue every 5 (five) minutes as needed for chest pain. 25 tablet 3  . pantoprazole (PROTONIX) 40 MG tablet TAKE 1 TABLET BY MOUTH TWICE A DAY 180 tablet 0  . PROAIR HFA 108 (90 Base) MCG/ACT inhaler INHALE 2 PUFFS INTO THE LUNGS EVERY 6 (SIX) HOURS AS NEEDED FOR WHEEZING. 8.5 Inhaler 1  . triamcinolone cream (KENALOG) 0.1 % Apply 1 application topically 2 (two) times daily. 45 g 5   No facility-administered medications prior to visit.    Allergies  Allergen Reactions  . Augmentin [Amoxicillin-Pot Clavulanate] Nausea And Vomiting  . Clindamycin/Lincomycin Nausea And Vomiting  . Oxycodone-Acetaminophen Nausea And Vomiting    ROS Review of Systems  Constitutional: Negative for chills and fever.  Musculoskeletal: Positive for arthralgias.      Objective:    Physical Exam Vitals reviewed.  Constitutional:      Appearance:  Normal appearance.  Cardiovascular:     Rate and Rhythm: Normal rate and regular rhythm.  Pulmonary:     Effort: Pulmonary effort is normal.     Breath sounds: Normal breath sounds.  Musculoskeletal:     Comments: She does have some mild swelling of the right wrist somewhat diffusely.  She does not have any visible swelling of the hand but has tenderness in palpating over the MCP joints.  Possibly mild warmth right wrist compared to left.  No visible erythema.  No ecchymosis.  Good range of motion all joints.  She has significant pain involving all the digits with grip on the right.  Full range of motion elbow with no tenderness around the elbow region.  Neurological:     Mental Status: She is alert.     BP 130/90 (BP Location: Left Arm, Patient Position: Sitting, Cuff Size: Normal)   Pulse 67   Temp 98.4 F (36.9 C) (Oral)   Wt 188 lb (85.3 kg)   SpO2 97%   BMI 35.52 kg/m  Wt Readings from Last 3 Encounters:  12/26/19 188 lb (85.3 kg)  11/21/19 184 lb 12.8 oz (83.8 kg)  09/03/19 184 lb (83.5 kg)     Health Maintenance Due  Topic Date Due  . Hepatitis C Screening  Never done  . DEXA SCAN  Never done  . PNA vac Low Risk Adult (2 of 2 - PPSV23) 01/08/2019  . INFLUENZA VACCINE  11/02/2019    There are no preventive care reminders to display for this patient.  Lab Results  Component Value Date   TSH 1.500 01/28/2019  Lab Results  Component Value Date   WBC 8.0 01/28/2019   HGB 14.6 01/28/2019   HCT 44.9 01/28/2019   MCV 94 01/28/2019   PLT 299 01/28/2019   Lab Results  Component Value Date   NA 144 01/28/2019   K 4.8 01/28/2019   CO2 23 01/28/2019   GLUCOSE 109 (H) 01/28/2019   BUN 26 01/28/2019   CREATININE 1.48 (H) 01/28/2019   BILITOT 0.6 01/07/2018   ALKPHOS 64 01/07/2018   AST 12 01/07/2018   ALT 8 01/07/2018   PROT 7.0 01/07/2018   ALBUMIN 4.3 01/07/2018   CALCIUM 9.3 01/28/2019   ANIONGAP 14 05/14/2015   GFR 42.46 (L) 05/01/2018   Lab Results    Component Value Date   CHOL 164 01/07/2018   Lab Results  Component Value Date   HDL 67.50 01/07/2018   Lab Results  Component Value Date   LDLCALC 74 01/07/2018   Lab Results  Component Value Date   TRIG 111.0 01/07/2018   Lab Results  Component Value Date   CHOLHDL 2 01/07/2018   No results found for: HGBA1C    Assessment & Plan:   Patient relates acute/subacute pain right upper extremity.  Her current pain is centered around the wrist and MCP joints.  Mild swelling of the wrist.  No recent injury.  No evidence for infection.  Rule out acute inflammatory arthritis.  Differential would include pseudogout, less likely gout, inflammatory arthritis such as rheumatoid.  Her current presentation is not consistent with tendinitis alone  -Check labs with CBC, sed rate, uric acid level, rheumatoid factor, ANA, CCP antibody -We did agree to 1 more prednisone taper until further clarified -Consider short wrist splint for immobilization to help her wrist pain -Also consider x-ray right wrist and hand.  We will start with labs and prednisone first  Meds ordered this encounter  Medications  . DISCONTD: predniSONE (DELTASONE) 10 MG tablet    Sig: Taper as follows: 6-6-4-4-3-3-2-2-1-1    Dispense:  32 tablet    Refill:  0  . predniSONE (DELTASONE) 10 MG tablet    Sig: Taper as follows: 6-6-4-4-3-3-2-2-1-1    Dispense:  32 tablet    Refill:  0    Follow-up: No follow-ups on file.    Carolann Littler, MD

## 2019-12-31 LAB — CBC WITH DIFFERENTIAL/PLATELET
Absolute Monocytes: 764 cells/uL (ref 200–950)
Basophils Absolute: 67 cells/uL (ref 0–200)
Basophils Relative: 0.8 %
Eosinophils Absolute: 143 cells/uL (ref 15–500)
Eosinophils Relative: 1.7 %
HCT: 44.4 % (ref 35.0–45.0)
Hemoglobin: 14.7 g/dL (ref 11.7–15.5)
Lymphs Abs: 3024 cells/uL (ref 850–3900)
MCH: 31.1 pg (ref 27.0–33.0)
MCHC: 33.1 g/dL (ref 32.0–36.0)
MCV: 94.1 fL (ref 80.0–100.0)
MPV: 9.3 fL (ref 7.5–12.5)
Monocytes Relative: 9.1 %
Neutro Abs: 4402 cells/uL (ref 1500–7800)
Neutrophils Relative %: 52.4 %
Platelets: 387 10*3/uL (ref 140–400)
RBC: 4.72 10*6/uL (ref 3.80–5.10)
RDW: 12.9 % (ref 11.0–15.0)
Total Lymphocyte: 36 %
WBC: 8.4 10*3/uL (ref 3.8–10.8)

## 2019-12-31 LAB — CYCLIC CITRUL PEPTIDE ANTIBODY, IGG: Cyclic Citrullin Peptide Ab: <16 UNITS

## 2019-12-31 LAB — URIC ACID: Uric Acid, Serum: 5.9 mg/dL (ref 2.5–7.0)

## 2019-12-31 LAB — SEDIMENTATION RATE: Sed Rate: 41 mm/h — ABNORMAL HIGH (ref 0–30)

## 2019-12-31 LAB — ANA: Anti Nuclear Antibody (ANA): NEGATIVE

## 2019-12-31 LAB — RHEUMATOID FACTOR: Rheumatoid fact SerPl-aCnc: <14 IU/mL (ref <14)

## 2020-01-12 ENCOUNTER — Other Ambulatory Visit: Payer: Self-pay

## 2020-01-12 ENCOUNTER — Encounter: Payer: Self-pay | Admitting: Family Medicine

## 2020-01-12 ENCOUNTER — Ambulatory Visit (INDEPENDENT_AMBULATORY_CARE_PROVIDER_SITE_OTHER): Payer: Medicare Other | Admitting: Family Medicine

## 2020-01-12 VITALS — BP 154/90 | HR 71 | Temp 98.4°F | Ht 61.0 in | Wt 189.6 lb

## 2020-01-12 DIAGNOSIS — M25531 Pain in right wrist: Secondary | ICD-10-CM

## 2020-01-12 MED ORDER — COLCHICINE 0.6 MG PO TABS: 0.6000 mg | ORAL_TABLET | ORAL | 1 refills | Status: DC | PRN

## 2020-01-12 NOTE — Progress Notes (Signed)
   Subjective:    Patient ID: [name] [name], female    DOB: 06-27-1950, 69 y.o.   MRN: [id number]  HPI Here for continued pain and swelling in the right hand and wrist. This started about 2 months ago, and it waxes and wanes. She has taken 2 courses of Prednisone, and this has helped briefly, but the pain persists. She saw Dr. Elease Hashimoto here on 12-24-19 and she had labs drawn. These showed a normal uric acid, a negative RF, and an elevated ESR to 41. No hx of trauma.   Review of Systems  Constitutional: Negative.   Respiratory: Negative.   Cardiovascular: Negative.   Musculoskeletal: Positive for arthralgias.       Objective:   Physical Exam Constitutional:      Appearance: Normal appearance.  Cardiovascular:     Rate and Rhythm: Normal rate and regular rhythm.     Pulses: Normal pulses.     Heart sounds: Normal heart sounds.  Pulmonary:     Effort: Pulmonary effort is normal.     Breath sounds: Normal breath sounds.  Musculoskeletal:     Comments: The right wrist and hand are mildly swollen and very tender. No erythema or warmth. ROM of the wrist is limited by pain.   Neurological:     Mental Status: She is alert.           Assessment & Plan:  This is consistent with gout.  We will treat with Colchicine every 4 hours. Recheck prn. Alysia Penna, MD

## 2020-01-15 ENCOUNTER — Other Ambulatory Visit: Payer: Self-pay | Admitting: Family Medicine

## 2020-01-16 ENCOUNTER — Telehealth: Payer: Self-pay

## 2020-01-16 NOTE — Telephone Encounter (Signed)
Patient is having diarrhea and abdominal pains with Colchicine. Please advise.

## 2020-01-16 NOTE — Telephone Encounter (Signed)
Patient called in stating that the medication she was given in visit 01/12/20 has caused her a great deal of diarrhea  and she can't leave her house or anything she needs to know what to do or if she needs to stop the medication. She is having bad stomach pains    Please call and advise

## 2020-01-18 ENCOUNTER — Other Ambulatory Visit: Payer: Self-pay | Admitting: Physician Assistant

## 2020-01-19 NOTE — Telephone Encounter (Signed)
I would stop it for now. Is her wrist feeling better?

## 2020-01-19 NOTE — Telephone Encounter (Signed)
Patient called and she says she has not taken Colchicine since Wednesday, 10/13. She took imodium from 10/13-10/15/21 for diarrhea and no more diarrhea. She says her wrist is hurting like before and swollen. She wants to know if there is something else to take that will not cause the diarrhea and nausea. She says she hasn't eaten since Wednesday, only saltine crackers, due to the nausea. I advised I will send this to Dr. Sarajane Jews and call back with his recommendation.

## 2020-01-20 MED ORDER — INDOMETHACIN 50 MG PO CAPS: 50.0000 mg | ORAL_CAPSULE | Freq: Three times a day (TID) | ORAL | 1 refills | Status: DC | PRN

## 2020-01-20 NOTE — Telephone Encounter (Signed)
Patient called and advised of the recommendation from Dr. Sarajane Jews, she verbalized understanding. [facility] to [facility].

## 2020-01-20 NOTE — Telephone Encounter (Signed)
Call in Indomethacin 50 mg to take TID as needed for gout, #60 with 2 rf

## 2020-01-21 ENCOUNTER — Telehealth: Payer: Self-pay | Admitting: Family Medicine

## 2020-01-21 MED ORDER — METHYLPREDNISOLONE 4 MG PO TBPK: ORAL_TABLET | ORAL | 0 refills | Status: DC

## 2020-01-21 NOTE — Telephone Encounter (Signed)
[facility] called and spoke to [place], [facility] about the patient's call. She says the patient is listed as stage 4 kidney disease and this is a contraindication for Indomethacin. I advised Dr. Sarajane Jews and he says to cancel the order for indomethacin and prescribe a Medrol dose pack 4 mg #21/0 refills to be taken as directed. I advised the [place] that I will be sending in the Rx electronically and to cancel the Indomethacin, she verbalized understanding. Patient called and advised of the medication change per Dr. Sarajane Jews, she verbalized understanding.

## 2020-01-21 NOTE — Telephone Encounter (Signed)
Pt is calling in stating that the medication that was called in on yesterday the pharmacy is stating that they will not fill do to the pt being allergic to aspirin, but pt states that she is not.  Pt would like to see if someone can call in to let the pharmacy know that they can fill the medication so that she can get started on it for the swelling in her hand.  [facility] [address]

## 2020-01-28 ENCOUNTER — Telehealth: Payer: Self-pay | Admitting: Family Medicine

## 2020-01-28 NOTE — Telephone Encounter (Signed)
Patient called and she says the pharmacist suggested to ask Dr. Sarajane Jews about trying Allopurinol for the gout in her hands. She says her hands still hurt now that the prednisone is wearing off and still swollen. I advised I will send this to Dr. Sarajane Jews and call back with his recommendation.

## 2020-01-28 NOTE — Telephone Encounter (Signed)
Patient needs allopurinol called in for her  [id number] [address], [place] Phone:  [phone]  Fax:  [phone]

## 2020-01-29 NOTE — Telephone Encounter (Signed)
Patient called back in to let Dr know she woke up this morning with her hands swollen to much it hurts to move a finger. She really needs this medication sent in   Please call and advise

## 2020-01-30 MED ORDER — ALLOPURINOL 100 MG PO TABS: 100.0000 mg | ORAL_TABLET | Freq: Every day | ORAL | 5 refills | Status: DC

## 2020-01-30 NOTE — Telephone Encounter (Signed)
I sent in the rx for Allopurinol

## 2020-01-30 NOTE — Telephone Encounter (Signed)
Patient called back in wanting to know if the Dr would at least try and send something into the pharmacy by today she's been in pain all week and its getting worse     Please call and advise

## 2020-01-30 NOTE — Addendum Note (Signed)
Addended by: Alysia Penna A on: 01/30/2020 05:18 PM   Modules accepted: Orders

## 2020-02-10 ENCOUNTER — Encounter: Payer: Self-pay | Admitting: Family Medicine

## 2020-02-10 ENCOUNTER — Ambulatory Visit (INDEPENDENT_AMBULATORY_CARE_PROVIDER_SITE_OTHER): Payer: Medicare Other | Admitting: Family Medicine

## 2020-02-10 ENCOUNTER — Other Ambulatory Visit: Payer: Self-pay

## 2020-02-10 VITALS — BP 112/82 | HR 75 | Temp 98.8°F | Ht 60.98 in | Wt 188.2 lb

## 2020-02-10 DIAGNOSIS — M79641 Pain in right hand: Secondary | ICD-10-CM

## 2020-02-10 MED ORDER — PREDNISONE 10 MG PO TABS
ORAL_TABLET | ORAL | 0 refills | Status: DC
Start: 2020-02-10 — End: 2021-06-15

## 2020-02-10 MED ORDER — OXYCODONE-ACETAMINOPHEN 10-325 MG PO TABS
1.0000 | ORAL_TABLET | ORAL | 0 refills | Status: AC | PRN
Start: 2020-02-10 — End: 2020-02-15

## 2020-02-10 NOTE — Progress Notes (Signed)
   Subjective:    Patient ID: [name] [name], female    DOB: 1950/08/02, 69 y.o.   MRN: [id number]  HPI Here for continuing pain and swelling in the right hand and forearm, presumably from gout. This started about 6 months ago off and on. She responded to high dose Prednisone in September but this was only a 6 day taper. She has tried a Medrol dose pack with no response at all. No fever. She is on Allopurinol.    Review of Systems  Constitutional: Negative.   Respiratory: Negative.   Cardiovascular: Negative.   Musculoskeletal: Positive for arthralgias.       Objective:   Physical Exam Constitutional:      Comments: In pain   Cardiovascular:     Rate and Rhythm: Normal rate and regular rhythm.     Pulses: Normal pulses.     Heart sounds: Normal heart sounds.  Pulmonary:     Effort: Pulmonary effort is normal.     Breath sounds: Normal breath sounds.  Musculoskeletal:     Comments: Right forearm and wrist and hand are swollen and very tender. No warmth or erythema  Neurological:     Mental Status: She is alert.           Assessment & Plan:  Gout. She will take a 25 day taper of Prednisone, beginning with 60 mg a day. Use Percocet as needed for pain. Recheck prn.  Alysia Penna, MD

## 2020-02-14 ENCOUNTER — Other Ambulatory Visit: Payer: Self-pay | Admitting: Student

## 2020-03-01 ENCOUNTER — Ambulatory Visit: Payer: Medicare Other

## 2020-03-02 ENCOUNTER — Ambulatory Visit (INDEPENDENT_AMBULATORY_CARE_PROVIDER_SITE_OTHER): Payer: Medicare Other

## 2020-03-02 ENCOUNTER — Other Ambulatory Visit: Payer: Self-pay

## 2020-03-02 DIAGNOSIS — Z0000 Encounter for general adult medical examination without abnormal findings: Secondary | ICD-10-CM | POA: Diagnosis not present

## 2020-03-02 NOTE — Progress Notes (Signed)
Subjective:   [name] [name] is a 69 y.o. female who presents for an Initial Medicare Annual Wellness Visit.  I connected with [name] [name]  today by telephone and verified that I am speaking with the correct person using two identifiers. Location patient: home Location provider: work Persons participating in the virtual visit: patient, provider.   I discussed the limitations, risks, security and privacy concerns of performing an evaluation and management service by telephone and the availability of in person appointments. I also discussed with the patient that there may be a patient responsible charge related to this service. The patient expressed understanding and verbally consented to this telephonic visit.    Interactive audio and video telecommunications were attempted between this provider and patient, however failed, due to patient having technical difficulties OR patient did not have access to video capability.  We continued and completed visit with audio only.      Review of Systems    N/A  Cardiac Risk Factors include: advanced age (>67men, >29 women);hypertension;dyslipidemia     Objective:    Today's Vitals   03/02/20 1446  PainSc: 5    There is no height or weight on file to calculate BMI.  Advanced Directives 03/02/2020 06/08/2016 06/07/2016 12/03/2015 11/12/2014 10/16/2014 10/07/2014  Does Patient Have a Medical Advance Directive? No No No No No No No  Does patient want to make changes to medical advance directive? No - Patient declined - - - - - -  Would patient like information on creating a medical advance directive? - No - Patient declined No - Patient declined - No - patient declined information - -    Current Medications (verified) Outpatient Encounter Medications as of 03/02/2020  Medication Sig  . allopurinol (ZYLOPRIM) 100 MG tablet Take 1 tablet (100 mg total) by mouth daily.  Marland Kitchen ALPRAZolam (XANAX) 0.25 MG tablet TAKE 3 TABLETS BY MOUTH EVERY 6 HOURS AS  NEEDED  . aspirin EC 81 MG tablet Take 1 tablet (81 mg total) by mouth daily.  Marland Kitchen atorvastatin (LIPITOR) 20 MG tablet TAKE 1 TABLET BY MOUTH EVERY DAY  . buPROPion (WELLBUTRIN XL) 300 MG 24 hr tablet TAKE 1 TABLET BY MOUTH EVERY DAY  . cetirizine (ZYRTEC) 10 MG tablet Take 10 mg by mouth daily as needed for allergies.   . cholecalciferol (VITAMIN D3) 25 MCG (1000 UNIT) tablet Take 1,000 Units by mouth daily.  . flecainide (TAMBOCOR) 50 MG tablet Take 1 tablet (50 mg total) by mouth 3 (three) times daily. Please keep upcoming appt in January with Dr. Angelena Form for future refills. Thank you  . lisinopril (ZESTRIL) 10 MG tablet Take 10 mg by mouth daily.  . Magnesium 400 MG CAPS Take 400 mg by mouth daily.  . Magnesium Oxide -Mg Supplement 400 MG CAPS Take 1 capsule by mouth daily.  . methylPREDNISolone (MEDROL DOSEPAK) 4 MG TBPK tablet Take as directed.  . metoprolol tartrate (LOPRESSOR) 50 MG tablet Take 50 mg by mouth 2 (two) times daily.  . pantoprazole (PROTONIX) 40 MG tablet TAKE 1 TABLET BY MOUTH TWICE A DAY  . predniSONE (DELTASONE) 10 MG tablet Take 6 tabs a day for 5 days, then 5 a day for 5 days, then 4 a day for 5 days, then 3 a day for 5 days, then 2 a day for 5 days, then 1 a day for 5 days, then stop  . triamcinolone cream (KENALOG) 0.1 % Apply 1 application topically 2 (two) times daily.  . mupirocin ointment (  BACTROBAN) 2 % Place 1 application into the nose 2 (two) times daily. (Patient not taking: Reported on 03/02/2020)  . nitroGLYCERIN (NITROSTAT) 0.4 MG SL tablet Place 1 tablet (0.4 mg total) under the tongue every 5 (five) minutes as needed for chest pain. (Patient not taking: Reported on 03/02/2020)  . PROAIR HFA 108 (90 Base) MCG/ACT inhaler INHALE 2 PUFFS INTO THE LUNGS EVERY 6 (SIX) HOURS AS NEEDED FOR WHEEZING. (Patient not taking: Reported on 03/02/2020)  . [DISCONTINUED] Vitamin D, Ergocalciferol, (DRISDOL) 1.25 MG (50000 UNIT) CAPS capsule Take 50,000 Units by mouth once a  week. (Patient not taking: Reported on 03/02/2020)   No facility-administered encounter medications on file as of 03/02/2020.    Allergies (verified) Augmentin [amoxicillin-pot clavulanate], Clindamycin/lincomycin, and Colchicine   History: Past Medical History:  Diagnosis Date  . Allergic rhinitis   . ALLERGIC RHINITIS 05/08/2007   Qualifier: Diagnosis of  By: Sherlynn Stalls, CMA, [place]    . Allergy    seasonal  . Anxiety   . Anxiety state 05/25/2014  . Aortic valve disease    a. ? possible bicuspid AV.  Marland Kitchen CAD (coronary artery disease)    a. nonobstructive by prior cath  . Cataract    bilateral  . Chronic low back pain 03/09/2016  . Complication of anesthesia 2003   Medication for block "went up to my brain and I stopped breathing"  . COPD (chronic obstructive pulmonary disease) ([place])   . COPD (chronic obstructive pulmonary disease) with chronic bronchitis ([place]) 10/12/2014  . Depression   . Depression, major, single episode, moderate ([place]) 05/08/2007   Qualifier: Diagnosis of  By: Sherlynn Stalls, CMA, [place]    . Dexamethasone adverse reaction    2002 normal  . Dilated aortic root ([facility])    a. mild dilated aortic root on prior echo, ascending aorta prominence on CT 2015.  . Duodenal mass   . GERD 05/08/2007   Qualifier: Diagnosis of  By: Sherlynn Stalls, CMA, [place]    . Hyperlipidemia   . Hypertension   . Insomnia   . Neck pain, chronic 03/09/2016  . NICOTINE ADDICTION 05/08/2007   Qualifier: Diagnosis of  By: Sarajane Jews MD, Ishmael Holter   . Osteoarthritis   . Overactive bladder   . Pneumonia    once or twivce in past  . Premature ventricular contractions    LBBB inferior axis PVCs  . Thoracic back pain 05/30/2014  . Tobacco abuse   . Ulcer    duodenal   Past Surgical History:  Procedure Laterality Date  . BREAST BIOPSY    . CARDIAC CATHETERIZATION N/A 12/03/2015   Procedure: Left Heart Cath and Coronary Angiography;  Surgeon: Burnell Blanks, MD;  Location: [facility];  Service:  Cardiovascular;  Laterality: N/A;  . CARPAL TUNNEL RELEASE Left    1997 left,    . CHOLECYSTECTOMY N/A 03/23/2015   Procedure: LAPAROSCOPIC CHOLECYSTECTOMY;  Surgeon: Ralene Ok, MD;  Location: [facility];  Service: General;  Laterality: N/A;  . COLONOSCOPY  10-17-13   per Dr. Olevia Perches, benign polyps, repeat in 10 years  . EUS N/A 11/12/2014   Procedure: UPPER ENDOSCOPIC ULTRASOUND (EUS) LINEAR;  Surgeon: Milus Banister, MD;  Location: [facility];  Service: Endoscopy;  Laterality: N/A;  . EUS N/A 06/08/2016   Procedure: UPPER ENDOSCOPIC ULTRASOUND (EUS) RADIAL;  Surgeon: Milus Banister, MD;  Location: [facility];  Service: Endoscopy;  Laterality: N/A;  . LEFT AND RIGHT HEART CATHETERIZATION WITH CORONARY ANGIOGRAM N/A 04/22/2014   Procedure: LEFT  AND RIGHT HEART CATHETERIZATION WITH CORONARY ANGIOGRAM;  Surgeon: Burnell Blanks, MD;  Location: [facility];  Service: Cardiovascular;  Laterality: N/A;  . TONSILLECTOMY  1950  . torn cartilage repaired rt wrist Right 2003   Family History  Problem Relation Age of Onset  . CAD Brother   . Kidney disease Brother   . Emphysema Mother   . Other Father        brain tumor  . Throat cancer Brother   . Hyperlipidemia Other   . Hypertension Other   . Kidney disease Other   . Heart disease Other   . Colon cancer Neg Hx   . Esophageal cancer Neg Hx   . Stomach cancer Neg Hx   . Rectal cancer Neg Hx    Social History   Socioeconomic History  . Marital status: Single    Spouse name: Not on file  . Number of children: 0  . Years of education: Not on file  . Highest education level: Not on file  Occupational History  . Occupation: CNA  Tobacco Use  . Smoking status: Former Smoker    Packs/day: 0.45    Years: 54.00    Pack years: 24.30    Types: Cigarettes    Quit date: 01/01/2017    Years since quitting: 3.1  . Smokeless tobacco: Never Used  . Tobacco comment: 1/2 pack per day or less  Vaping Use  . Vaping Use: Never used   Substance and Sexual Activity  . Alcohol use: No    Alcohol/week: 0.0 standard drinks  . Drug use: No  . Sexual activity: Not on file  Other Topics Concern  . Not on file  Social History Narrative   Works as Quarry manager at [employer].   Social Determinants of Health   Financial Resource Strain: Low Risk   . Difficulty of Paying Living Expenses: Not hard at all  Food Insecurity: No Food Insecurity  . Worried About Charity fundraiser in the Last Year: Never true  . Ran Out of Food in the Last Year: Never true  Transportation Needs: No Transportation Needs  . Lack of Transportation (Medical): No  . Lack of Transportation (Non-Medical): No  Physical Activity: Inactive  . Days of Exercise per Week: 0 days  . Minutes of Exercise per Session: 0 min  Stress: No Stress Concern Present  . Feeling of Stress : Not at all  Social Connections: Socially Isolated  . Frequency of Communication with Friends and Family: Once a week  . Frequency of Social Gatherings with Friends and Family: Never  . Attends Religious Services: Never  . Active Member of Clubs or Organizations: No  . Attends Archivist Meetings: Never  . Marital Status: Never married    Tobacco Counseling Counseling given: Not Answered Comment: 1/2 pack per day or less   Clinical Intake:  Pre-visit preparation completed: Yes  Pain : 0-10 Pain Score: 5  Pain Type: Chronic pain Pain Location: Hand (wrist) Pain Descriptors / Indicators: Aching Pain Onset: More than a month ago Pain Frequency: Intermittent Pain Relieving Factors: Prednisone, oxycodone  Pain Relieving Factors: Prednisone, oxycodone  Nutritional Risks: Nausea/ vomitting/ diarrhea Diabetes: No  How often do you need to have someone help you when you read instructions, pamphlets, or other written materials from your doctor or pharmacy?: 1 - Never What is the last grade level you completed in school?: 12th grade  Diabetic?No   Interpreter  Needed?: No  Information entered by ::  SCrews,LPN   Activities of Daily Living In your present state of health, do you have any difficulty performing the following activities: 03/02/2020  Hearing? Y  Vision? Y  Difficulty concentrating or making decisions? N  Walking or climbing stairs? Y  Dressing or bathing? N  Doing errands, shopping? N  Preparing Food and eating ? N  Using the Toilet? N  In the past six months, have you accidently leaked urine? Y  Comment Occassional dribbles of urine  Do you have problems with loss of bowel control? N  Managing your Medications? N  Managing your Finances? N  Housekeeping or managing your Housekeeping? N  Some recent data might be hidden    Patient Care Team: [name], MD as PCP - General Angelena Form Annita Brod, MD as PCP - Cardiology (Cardiology) Thompson Grayer, MD as PCP - Electrophysiology (Cardiology) Viona Gilmore, [facility] as Pharmacist (Pharmacist)  Indicate any recent Medical Services you may have received from other than [facility] providers in the past year (date may be approximate).     Assessment:   This is a routine wellness examination for [place].  Hearing/Vision screen  Hearing Screening   125Hz  250Hz  500Hz  1000Hz  2000Hz  3000Hz  4000Hz  6000Hz  8000Hz   Right ear:           Left ear:           Vision Screening Comments: Patient states has not had eyes examined in greater than 3 years.   Dietary issues and exercise activities discussed: Current Exercise Habits: The patient does not participate in regular exercise at present  Goals    . Exercise 3x per week (30 min per time)      Depression Screen PHQ 2/9 Scores 03/02/2020 02/10/2020 12/18/2017 06/23/2016  PHQ - 2 Score 2 2 1 3   PHQ- 9 Score 10 10 - 6    Fall Risk Fall Risk  03/02/2020 02/10/2020 12/18/2017 07/20/2016 06/23/2016  Falls in the past year? 1 1 Yes Yes No  Number falls in past yr: 1 1 2  or more 1 -  Injury with Fall? 1 1 Yes No -  Risk for fall due to :  History of fall(s) - - - -  Follow up Falls evaluation completed;Falls prevention discussed Falls evaluation completed - Falls evaluation completed -    Any stairs in or around the home? Yes  If so, are there any without handrails? No  Home free of loose throw rugs in walkways, pet beds, electrical cords, etc? Yes  Adequate lighting in your home to reduce risk of falls? Yes   ASSISTIVE DEVICES UTILIZED TO PREVENT FALLS:  Life alert? No  Use of a cane, walker or w/c? No  Grab bars in the bathroom? No  Shower chair or bench in shower? No  Elevated toilet seat or a handicapped toilet? No     Cognitive Function:  Cognitive screening not indicated based on direct observation. Montreal Cognitive Assessment  07/20/2016  Visuospatial/ Executive (0/5) 4  Naming (0/3) 3  Attention: Read list of digits (0/2) 2  Attention: Read list of letters (0/1) 1  Attention: Serial 7 subtraction starting at 100 (0/3) 3  Language: Repeat phrase (0/2) 2  Language : Fluency (0/1) 0  Abstraction (0/2) 2  Delayed Recall (0/5) 0  Orientation (0/6) 6  Total 23  Adjusted Score (based on education) 24      Immunizations Immunization History  Administered Date(s) Administered  . Influenza, High Dose Seasonal PF 02/22/2015, 02/02/2016, 01/23/2017, 01/07/2018  .  Influenza-Unspecified 02/01/2014  . Pneumococcal Conjugate-13 01/07/2018  . Tdap 12/10/2017    TDAP status: Up to date Flu Vaccine status: Declined, Education has been provided regarding the importance of this vaccine but patient still declined. Advised may receive this vaccine at local pharmacy or Health Dept. Aware to provide a copy of the vaccination record if obtained from local pharmacy or Health Dept. Verbalized acceptance and understanding. Pneumococcal vaccine status: Declined,  Education has been provided regarding the importance of this vaccine but patient still declined. Advised may receive this vaccine at local pharmacy or Health Dept.  Aware to provide a copy of the vaccination record if obtained from local pharmacy or Health Dept. Verbalized acceptance and understanding.  Covid-19 vaccine status: Declined, Education has been provided regarding the importance of this vaccine but patient still declined. Advised may receive this vaccine at local pharmacy or Health Dept.or vaccine clinic. Aware to provide a copy of the vaccination record if obtained from local pharmacy or Health Dept. Verbalized acceptance and understanding.  Qualifies for Shingles Vaccine? Yes   Zostavax completed No   Shingrix Completed?: No.    Education has been provided regarding the importance of this vaccine. Patient has been advised to call insurance company to determine out of pocket expense if they have not yet received this vaccine. Advised may also receive vaccine at local pharmacy or Health Dept. Verbalized acceptance and understanding.  Screening Tests Health Maintenance  Topic Date Due  . Hepatitis C Screening  Never done  . COVID-19 Vaccine (1) Never done  . DEXA SCAN  Never done  . PNA vac Low Risk Adult (2 of 2 - PPSV23) 01/08/2019  . INFLUENZA VACCINE  11/02/2019  . MAMMOGRAM  02/15/2020  . COLONOSCOPY  10/18/2023  . TETANUS/TDAP  12/11/2027    Health Maintenance  Health Maintenance Due  Topic Date Due  . Hepatitis C Screening  Never done  . COVID-19 Vaccine (1) Never done  . DEXA SCAN  Never done  . PNA vac Low Risk Adult (2 of 2 - PPSV23) 01/08/2019  . INFLUENZA VACCINE  11/02/2019  . MAMMOGRAM  02/15/2020    Colorectal cancer screening: Completed 10/17/2013. Repeat every 10 years Mammogram status: Ordered 03/02/2020. Pt provided with contact info and advised to call to schedule appt.  Bone Density status: Ordered 03/02/2020. Pt provided with contact info and advised to call to schedule appt.  Lung Cancer Screening: (Low Dose CT Chest recommended if Age 52-80 years, 30 pack-year currently smoking OR have quit w/in 15years.)  does not qualify.   Lung Cancer Screening Referral: N/A   Additional Screening:  Hepatitis C Screening: does qualify;   Vision Screening: Recommended annual ophthalmology exams for early detection of glaucoma and other disorders of the eye. Is the patient up to date with their annual eye exam?  No  Who is the provider or what is the name of the office in which the patient attends annual eye exams? Dr.Wood  If pt is not established with a provider, would they like to be referred to a provider to establish care? No .   Dental Screening: Recommended annual dental exams for proper oral hygiene  Community Resource Referral / Chronic Care Management: CRR required this visit?  No   CCM required this visit?  No      Plan:     I have personally reviewed and noted the following in the patient's chart:   . Medical and social history . Use of alcohol, tobacco or illicit  drugs  . Current medications and supplements . Functional ability and status . Nutritional status . Physical activity . Advanced directives . List of other physicians . Hospitalizations, surgeries, and ER visits in previous 12 months . Vitals . Screenings to include cognitive, depression, and falls . Referrals and appointments  In addition, I have reviewed and discussed with patient certain preventive protocols, quality metrics, and best practice recommendations. A written personalized care plan for preventive services as well as general preventive health recommendations were provided to patient.     Ofilia Neas, LPN   [id number]   Nurse Notes: None

## 2020-03-02 NOTE — Patient Instructions (Signed)
[name] [name] , Thank you for taking time to come for your Medicare Wellness Visit. I appreciate your ongoing commitment to your health goals. Please review the following plan we discussed and let me know if I can assist you in the future.   Screening recommendations/referrals: Colonoscopy: Up to date, next due 10/18/2023 Mammogram: Currently due, orders placed this visit  Bone Density: Currently due orders placed this visit Recommended yearly ophthalmology/optometry visit for glaucoma screening and checkup Recommended yearly dental visit for hygiene and checkup  Vaccinations: Influenza vaccine: currently due, if you wish to receive you may do so at our office or your local pharmacy. Pneumococcal vaccine: Currently due for Prevnar 13  You may receive at your next in person office visit  Tdap vaccine: Up to date, next due 12/10/2017 Shingles vaccine: Currently due for Shingrix, if you wish to receive we recommend that you do so at your local pharmacy.    Advanced directives: Advance directive discussed with you today. Even though you declined this today please call our office should you change your mind and we can give you the proper paperwork for you to fill out.   Conditions/risks identified: None   Next appointment: 03/03/2021 @ 2:45 PM with [place] for Medicare Wellness visit    Preventive Care 5 Years and Older, Female Preventive care refers to lifestyle choices and visits with your health care provider that can promote health and wellness. What does preventive care include?  A yearly physical exam. This is also called an annual well check.  Dental exams once or twice a year.  Routine eye exams. Ask your health care provider how often you should have your eyes checked.  Personal lifestyle choices, including:  Daily care of your teeth and gums.  Regular physical activity.  Eating a healthy diet.  Avoiding tobacco and drug use.  Limiting alcohol  use.  Practicing safe sex.  Taking low-dose aspirin every day.  Taking vitamin and mineral supplements as recommended by your health care provider. What happens during an annual well check? The services and screenings done by your health care provider during your annual well check will depend on your age, overall health, lifestyle risk factors, and family history of disease. Counseling  Your health care provider may ask you questions about your:  Alcohol use.  Tobacco use.  Drug use.  Emotional well-being.  Home and relationship well-being.  Sexual activity.  Eating habits.  History of falls.  Memory and ability to understand (cognition).  Work and work Statistician.  Reproductive health. Screening  You may have the following tests or measurements:  Height, weight, and BMI.  Blood pressure.  Lipid and cholesterol levels. These may be checked every 5 years, or more frequently if you are over 92 years old.  Skin check.  Lung cancer screening. You may have this screening every year starting at age 59 if you have a 30-pack-year history of smoking and currently smoke or have quit within the past 15 years.  Fecal occult blood test (FOBT) of the stool. You may have this test every year starting at age 65.  Flexible sigmoidoscopy or colonoscopy. You may have a sigmoidoscopy every 5 years or a colonoscopy every 10 years starting at age 48.  Hepatitis C blood test.  Hepatitis B blood test.  Sexually transmitted disease (STD) testing.  Diabetes screening. This is done by checking your blood sugar (glucose) after you have not eaten for a while (fasting). You may have this done every  1-3 years.  Bone density scan. This is done to screen for osteoporosis. You may have this done starting at age 33.  Mammogram. This may be done every 1-2 years. Talk to your health care provider about how often you should have regular mammograms. Talk with your health care provider about  your test results, treatment options, and if necessary, the need for more tests. Vaccines  Your health care provider may recommend certain vaccines, such as:  Influenza vaccine. This is recommended every year.  Tetanus, diphtheria, and acellular pertussis (Tdap, Td) vaccine. You may need a Td booster every 10 years.  Zoster vaccine. You may need this after age 67.  Pneumococcal 13-valent conjugate (PCV13) vaccine. One dose is recommended after age 52.  Pneumococcal polysaccharide (PPSV23) vaccine. One dose is recommended after age 88. Talk to your health care provider about which screenings and vaccines you need and how often you need them. This information is not intended to replace advice given to you by your health care provider. Make sure you discuss any questions you have with your health care provider. Document Released: 04/16/2015 Document Revised: 12/08/2015 Document Reviewed: 01/19/2015 Elsevier Interactive Patient Education  2017 [employer] Prevention in the Home Falls can cause injuries. They can happen to people of all ages. There are many things you can do to make your home safe and to help prevent falls. What can I do on the outside of my home?  Regularly fix the edges of walkways and driveways and fix any cracks.  Remove anything that might make you trip as you walk through a door, such as a raised step or threshold.  Trim any bushes or trees on the path to your home.  Use bright outdoor lighting.  Clear any walking paths of anything that might make someone trip, such as rocks or tools.  Regularly check to see if handrails are loose or broken. Make sure that both sides of any steps have handrails.  Any raised decks and porches should have guardrails on the edges.  Have any leaves, snow, or ice cleared regularly.  Use sand or salt on walking paths during winter.  Clean up any spills in your garage right away. This includes oil or grease spills. What  can I do in the bathroom?  Use night lights.  Install grab bars by the toilet and in the tub and shower. Do not use towel bars as grab bars.  Use non-skid mats or decals in the tub or shower.  If you need to sit down in the shower, use a plastic, non-slip stool.  Keep the floor dry. Clean up any water that spills on the floor as soon as it happens.  Remove soap buildup in the tub or shower regularly.  Attach bath mats securely with double-sided non-slip rug tape.  Do not have throw rugs and other things on the floor that can make you trip. What can I do in the bedroom?  Use night lights.  Make sure that you have a light by your bed that is easy to reach.  Do not use any sheets or blankets that are too big for your bed. They should not hang down onto the floor.  Have a firm chair that has side arms. You can use this for support while you get dressed.  Do not have throw rugs and other things on the floor that can make you trip. What can I do in the kitchen?  Clean up any spills right away.  Avoid walking on wet floors.  Keep items that you use a lot in easy-to-reach places.  If you need to reach something above you, use a strong step stool that has a grab bar.  Keep electrical cords out of the way.  Do not use floor polish or wax that makes floors slippery. If you must use wax, use non-skid floor wax.  Do not have throw rugs and other things on the floor that can make you trip. What can I do with my stairs?  Do not leave any items on the stairs.  Make sure that there are handrails on both sides of the stairs and use them. Fix handrails that are broken or loose. Make sure that handrails are as long as the stairways.  Check any carpeting to make sure that it is firmly attached to the stairs. Fix any carpet that is loose or worn.  Avoid having throw rugs at the top or bottom of the stairs. If you do have throw rugs, attach them to the floor with carpet tape.  Make sure  that you have a light switch at the top of the stairs and the bottom of the stairs. If you do not have them, ask someone to add them for you. What else can I do to help prevent falls?  Wear shoes that:  Do not have high heels.  Have rubber bottoms.  Are comfortable and fit you well.  Are closed at the toe. Do not wear sandals.  If you use a stepladder:  Make sure that it is fully opened. Do not climb a closed stepladder.  Make sure that both sides of the stepladder are locked into place.  Ask someone to hold it for you, if possible.  Clearly mark and make sure that you can see:  Any grab bars or handrails.  First and last steps.  Where the edge of each step is.  Use tools that help you move around (mobility aids) if they are needed. These include:  Canes.  Walkers.  Scooters.  Crutches.  Turn on the lights when you go into a dark area. Replace any light bulbs as soon as they burn out.  Set up your furniture so you have a clear path. Avoid moving your furniture around.  If any of your floors are uneven, fix them.  If there are any pets around you, be aware of where they are.  Review your medicines with your doctor. Some medicines can make you feel dizzy. This can increase your chance of falling. Ask your doctor what other things that you can do to help prevent falls. This information is not intended to replace advice given to you by your health care provider. Make sure you discuss any questions you have with your health care provider. Document Released: 01/14/2009 Document Revised: 08/26/2015 Document Reviewed: 04/24/2014 Elsevier Interactive Patient Education  2017 [employer].

## 2020-03-16 ENCOUNTER — Telehealth: Payer: Self-pay | Admitting: Family Medicine

## 2020-03-16 DIAGNOSIS — M79641 Pain in right hand: Secondary | ICD-10-CM

## 2020-03-16 NOTE — Telephone Encounter (Signed)
Pt is calling in stating that her R hand is gone back to being painful swelling and unable to bend her fingers after taking the prednisone and stopped it on 03/11/2020.  Pt would like to move forward with the referral to the hand specialist or whatever Dr. Sarajane Jews feels is necessary for her to do.

## 2020-03-17 NOTE — Telephone Encounter (Signed)
I did the referral to Hand Surgery

## 2020-03-17 NOTE — Telephone Encounter (Signed)
Pt is aware.  

## 2020-03-18 ENCOUNTER — Other Ambulatory Visit: Payer: Self-pay | Admitting: Family Medicine

## 2020-03-18 ENCOUNTER — Other Ambulatory Visit: Payer: Self-pay | Admitting: Nephrology

## 2020-03-18 DIAGNOSIS — R918 Other nonspecific abnormal finding of lung field: Secondary | ICD-10-CM

## 2020-03-21 ENCOUNTER — Other Ambulatory Visit: Payer: Self-pay | Admitting: Family Medicine

## 2020-03-30 DIAGNOSIS — M65311 Trigger thumb, right thumb: Secondary | ICD-10-CM | POA: Diagnosis not present

## 2020-03-30 DIAGNOSIS — M65341 Trigger finger, right ring finger: Secondary | ICD-10-CM | POA: Diagnosis not present

## 2020-03-30 DIAGNOSIS — M65351 Trigger finger, right little finger: Secondary | ICD-10-CM | POA: Diagnosis not present

## 2020-03-30 DIAGNOSIS — M1811 Unilateral primary osteoarthritis of first carpometacarpal joint, right hand: Secondary | ICD-10-CM | POA: Diagnosis not present

## 2020-03-30 DIAGNOSIS — G5601 Carpal tunnel syndrome, right upper limb: Secondary | ICD-10-CM | POA: Diagnosis not present

## 2020-03-30 DIAGNOSIS — M65331 Trigger finger, right middle finger: Secondary | ICD-10-CM | POA: Diagnosis not present

## 2020-04-05 ENCOUNTER — Other Ambulatory Visit: Payer: Self-pay

## 2020-04-05 ENCOUNTER — Encounter: Payer: Self-pay | Admitting: Cardiovascular Disease

## 2020-04-05 ENCOUNTER — Ambulatory Visit: Payer: Medicare Other | Admitting: Cardiovascular Disease

## 2020-04-05 VITALS — BP 118/80 | Ht 60.1 in | Wt 196.0 lb

## 2020-04-05 DIAGNOSIS — I25118 Atherosclerotic heart disease of native coronary artery with other forms of angina pectoris: Secondary | ICD-10-CM

## 2020-04-05 DIAGNOSIS — I359 Nonrheumatic aortic valve disorder, unspecified: Secondary | ICD-10-CM | POA: Diagnosis not present

## 2020-04-05 DIAGNOSIS — I493 Ventricular premature depolarization: Secondary | ICD-10-CM

## 2020-04-05 MED ORDER — NITROGLYCERIN 0.4 MG SL SUBL: 0.4000 mg | SUBLINGUAL_TABLET | SUBLINGUAL | 3 refills | Status: DC | PRN

## 2020-04-05 MED ORDER — MAGNESIUM 400 MG PO CAPS: 400.0000 mg | ORAL_CAPSULE | Freq: Every day | ORAL | 3 refills | Status: DC

## 2020-04-05 MED ORDER — FLECAINIDE ACETATE 50 MG PO TABS
50.0000 mg | ORAL_TABLET | Freq: Three times a day (TID) | ORAL | 3 refills | Status: DC
Start: 2020-04-05 — End: 2021-04-12

## 2020-04-05 NOTE — Progress Notes (Signed)
Chief Complaint  Patient presents with  . Follow-up    CAD   History of Present Illness: 70 yo female with history of CAD, HTN, HLD, GERD, OA, depression, ongoing tobacco abuse, COPD, palpitations/PVCs who is here today for cardiac follow up. I saw her in May 2015 for evaluation of dyspnea, weakness. She had been on a beta blocker for years for palpitations. Echo June 2015 showed normal LV size and function, bicuspid aortic valve, mildly dilated aortic root. Due to a shadow in the aorta, a CTA chest was arranged to exclude dissection. No dissection noted. Event monitor showed sinus rhythm with PVCs, bigeminy. Inderal changed to metoprolol. Cardiac cath January 2016 showed moderate mid LAD stenosis which was evaluated with FFR (0.82-0.85 suggesting this stenosis was not flow limiting). The Circumflex and RCA had mild disease. I saw her in March 2016 and she c/o palpitations associated with dizziness and weakness, bradycardia. She was seen by Dr. Rayann Heman and was started on Flecainide. Her PVC burden did improve with this but she has not tolerated high doses of Flecainide. She has tolerated metoprolol and low dose Flecainide. PVCs have not been frequent enough to consider ablation. She was seen by EP may 2021 and doing well. No changes were made. Repeat cardiac cath in September 2017 with 50% mid LAD stenosis, mild disease in the RCA and Circumflex. She had a home sleep study in March 2018 and she was told it was normal. Echo November 2020 with LVEF=60-65%, no valve insufficiency or stenosis. Possible bicuspid aortic valve noted by prior echo.   She is here today for follow up. The patient denies any chest pain, dyspnea, palpitations, lower extremity edema, orthopnea, PND, dizziness, near syncope or syncope. She has severe fatigue, going on for months. She has been working with primary care to evaluate this. She is having vision trouble. Her partner is now in a nursing facility. CBC was ok in September.    Primary Care Physician: [name], MD  Past Medical History:  Diagnosis Date  . Allergic rhinitis   . ALLERGIC RHINITIS 05/08/2007   Qualifier: Diagnosis of  By: Sherlynn Stalls, CMA, [place]    . Allergy    seasonal  . Anxiety   . Anxiety state 05/25/2014  . Aortic valve disease    a. ? possible bicuspid AV.  Marland Kitchen CAD (coronary artery disease)    a. nonobstructive by prior cath  . Cataract    bilateral  . Chronic low back pain 03/09/2016  . Complication of anesthesia 2003   Medication for block "went up to my brain and I stopped breathing"  . COPD (chronic obstructive pulmonary disease) ([place])   . COPD (chronic obstructive pulmonary disease) with chronic bronchitis ([place]) 10/12/2014  . Depression   . Depression, major, single episode, moderate ([place]) 05/08/2007   Qualifier: Diagnosis of  By: Sherlynn Stalls, CMA, [place]    . Dexamethasone adverse reaction    2002 normal  . Dilated aortic root ([facility])    a. mild dilated aortic root on prior echo, ascending aorta prominence on CT 2015.  . Duodenal mass   . GERD 05/08/2007   Qualifier: Diagnosis of  By: Sherlynn Stalls, CMA, [place]    . Hyperlipidemia   . Hypertension   . Insomnia   . Neck pain, chronic 03/09/2016  . NICOTINE ADDICTION 05/08/2007   Qualifier: Diagnosis of  By: Sarajane Jews MD, Ishmael Holter   . Osteoarthritis   . Overactive bladder   . Pneumonia    once or twivce  in past  . Premature ventricular contractions    LBBB inferior axis PVCs  . Thoracic back pain 05/30/2014  . Tobacco abuse   . Ulcer    duodenal    Past Surgical History:  Procedure Laterality Date  . BREAST BIOPSY    . CARDIAC CATHETERIZATION N/A 12/03/2015   Procedure: Left Heart Cath and Coronary Angiography;  Surgeon: Burnell Blanks, MD;  Location: [facility];  Service: Cardiovascular;  Laterality: N/A;  . CARPAL TUNNEL RELEASE Left    1997 left,    . CHOLECYSTECTOMY N/A 03/23/2015   Procedure: LAPAROSCOPIC CHOLECYSTECTOMY;  Surgeon: Ralene Ok, MD;  Location: [facility];   Service: General;  Laterality: N/A;  . COLONOSCOPY  10-17-13   per Dr. Olevia Perches, benign polyps, repeat in 10 years  . EUS N/A 11/12/2014   Procedure: UPPER ENDOSCOPIC ULTRASOUND (EUS) LINEAR;  Surgeon: Milus Banister, MD;  Location: [facility];  Service: Endoscopy;  Laterality: N/A;  . EUS N/A 06/08/2016   Procedure: UPPER ENDOSCOPIC ULTRASOUND (EUS) RADIAL;  Surgeon: Milus Banister, MD;  Location: [facility];  Service: Endoscopy;  Laterality: N/A;  . LEFT AND RIGHT HEART CATHETERIZATION WITH CORONARY ANGIOGRAM N/A 04/22/2014   Procedure: LEFT AND RIGHT HEART CATHETERIZATION WITH CORONARY ANGIOGRAM;  Surgeon: Burnell Blanks, MD;  Location[facility];  Service: Cardiovascular;  Laterality: N/A;  . TONSILLECTOMY  1950  . torn cartilage repaired rt wrist Right 2003    Current Outpatient Medications  Medication Sig Dispense Refill  . allopurinol (ZYLOPRIM) 100 MG tablet Take 1 tablet (100 mg total) by mouth daily. 30 tablet 5  . ALPRAZolam (XANAX) 0.25 MG tablet TAKE 3 TABLETS BY MOUTH EVERY 6 HOURS AS NEEDED 360 tablet 1  . aspirin EC 81 MG tablet Take 1 tablet (81 mg total) by mouth daily. 90 tablet 3  . atorvastatin (LIPITOR) 20 MG tablet TAKE 1 TABLET BY MOUTH EVERY DAY 90 tablet 0  . buPROPion (WELLBUTRIN XL) 300 MG 24 hr tablet TAKE 1 TABLET BY MOUTH EVERY DAY 90 tablet 3  . cetirizine (ZYRTEC) 10 MG tablet Take 10 mg by mouth daily as needed for allergies.     . cholecalciferol (VITAMIN D3) 25 MCG (1000 UNIT) tablet Take 1,000 Units by mouth daily.    Marland Kitchen lisinopril (ZESTRIL) 10 MG tablet Take 10 mg by mouth daily.    . Magnesium Oxide -Mg Supplement 400 MG CAPS Take 1 capsule by mouth daily.    . methylPREDNISolone (MEDROL DOSEPAK) 4 MG TBPK tablet Take as directed. 21 tablet 0  . metoprolol tartrate (LOPRESSOR) 50 MG tablet Take 50 mg by mouth 2 (two) times daily.    . pantoprazole (PROTONIX) 40 MG tablet TAKE 1 TABLET BY MOUTH TWICE A DAY 180 tablet 0  . predniSONE (DELTASONE)  10 MG tablet Take 6 tabs a day for 5 days, then 5 a day for 5 days, then 4 a day for 5 days, then 3 a day for 5 days, then 2 a day for 5 days, then 1 a day for 5 days, then stop 110 tablet 0  . PROAIR HFA 108 (90 Base) MCG/ACT inhaler INHALE 2 PUFFS INTO THE LUNGS EVERY 6 (SIX) HOURS AS NEEDED FOR WHEEZING. 8.5 Inhaler 1  . triamcinolone cream (KENALOG) 0.1 % Apply 1 application topically 2 (two) times daily. 45 g 5  . flecainide (TAMBOCOR) 50 MG tablet Take 1 tablet (50 mg total) by mouth 3 (three) times daily. 270 tablet 3  .  Magnesium 400 MG CAPS Take 400 mg by mouth daily. 90 capsule 3  . nitroGLYCERIN (NITROSTAT) 0.4 MG SL tablet Place 1 tablet (0.4 mg total) under the tongue every 5 (five) minutes as needed for chest pain. 25 tablet 3   No current facility-administered medications for this visit.    Allergies  Allergen Reactions  . Augmentin [Amoxicillin-Pot Clavulanate] Nausea And Vomiting  . Clindamycin/Lincomycin Nausea And Vomiting  . Colchicine Diarrhea    Social History   Socioeconomic History  . Marital status: Single    Spouse name: Not on file  . Number of children: 0  . Years of education: Not on file  . Highest education level: Not on file  Occupational History  . Occupation: CNA  Tobacco Use  . Smoking status: Former Smoker    Packs/day: 0.45    Years: 54.00    Pack years: 24.30    Types: Cigarettes    Quit date: 01/01/2017    Years since quitting: 3.2  . Smokeless tobacco: Never Used  . Tobacco comment: 1/2 pack per day or less  Vaping Use  . Vaping Use: Never used  Substance and Sexual Activity  . Alcohol use: No    Alcohol/week: 0.0 standard drinks  . Drug use: No  . Sexual activity: Not on file  Other Topics Concern  . Not on file  Social History Narrative   Works as Lawyer at [employer].   Social Determinants of Health   Financial Resource Strain: Low Risk   . Difficulty of Paying Living Expenses: Not hard at all  Food Insecurity: No Food  Insecurity  . Worried About Programme researcher, broadcasting/film/video in the Last Year: Never true  . Ran Out of Food in the Last Year: Never true  Transportation Needs: No Transportation Needs  . Lack of Transportation (Medical): No  . Lack of Transportation (Non-Medical): No  Physical Activity: Inactive  . Days of Exercise per Week: 0 days  . Minutes of Exercise per Session: 0 min  Stress: No Stress Concern Present  . Feeling of Stress : Not at all  Social Connections: Socially Isolated  . Frequency of Communication with Friends and Family: Once a week  . Frequency of Social Gatherings with Friends and Family: Never  . Attends Religious Services: Never  . Active Member of Clubs or Organizations: No  . Attends Banker Meetings: Never  . Marital Status: Never married  Intimate Partner Violence: Not At Risk  . Fear of Current or Ex-Partner: No  . Emotionally Abused: No  . Physically Abused: No  . Sexually Abused: No    Family History  Problem Relation Age of Onset  . CAD Brother   . Kidney disease Brother   . Emphysema Mother   . Other Father        brain tumor  . Throat cancer Brother   . Hyperlipidemia Other   . Hypertension Other   . Kidney disease Other   . Heart disease Other   . Colon cancer Neg Hx   . Esophageal cancer Neg Hx   . Stomach cancer Neg Hx   . Rectal cancer Neg Hx     Review of Systems:  As stated in the HPI and otherwise negative.   BP 118/80   Ht 5' 0.1" (1.527 m)   Wt 196 lb (88.9 kg)   SpO2 97%   BMI 38.15 kg/m   Physical Examination:  General: Well developed, well nourished, NAD  HEENT: OP clear,  mucus membranes moist  SKIN: warm, dry. No rashes. Neuro: No focal deficits  Musculoskeletal: Muscle strength 5/5 all ext  Psychiatric: Mood and affect normal  Neck: No JVD, no carotid bruits, no thyromegaly, no lymphadenopathy.  Lungs:Clear bilaterally, no wheezes, rhonci, crackles Cardiovascular: Regular rate and rhythm. No murmurs, gallops or  rubs. Abdomen:Soft. Bowel sounds present. Non-tender.  Extremities: No lower extremity edema. Pulses are 2 + in the bilateral DP/PT.  Echo November 2020:  1. Left ventricular ejection fraction, by visual estimation, is 60 to  65%. The left ventricle has normal function. There is no left ventricular  hypertrophy.  2. Left ventricular diastolic parameters are consistent with Grade I  diastolic dysfunction (impaired relaxation).  3. Global right ventricle has normal systolic function.The right  ventricular size is normal. No increase in right ventricular wall  thickness.  4. Left atrial size was normal.  5. Right atrial size was normal.  6. The mitral valve is normal in structure. No evidence of mitral valve  regurgitation. No evidence of mitral stenosis.  7. The tricuspid valve is normal in structure. Tricuspid valve  regurgitation is not demonstrated.  8. The aortic valve is normal in structure. Aortic valve regurgitation is  not visualized. No evidence of aortic valve sclerosis or stenosis.  9. The pulmonic valve was normal in structure. Pulmonic valve  regurgitation is not visualized.  10. There is mild dilatation of the ascending aorta measuring 41 mm.  11. The inferior vena cava is normal in size with greater than 50%  respiratory variability, suggesting right atrial pressure of 3 mmHg.   EKG:  EKG is ordered today. The ekg ordered today demonstrates Sinus, PVCs  Recent Labs: 08/26/2019: Magnesium 1.7 12/26/2019: Hemoglobin 14.7; Platelets 387   Lipid Panel    Component Value Date/Time   CHOL 164 01/07/2018 1414   TRIG 111.0 01/07/2018 1414   HDL 67.50 01/07/2018 1414   CHOLHDL 2 01/07/2018 1414   VLDL 22.2 01/07/2018 1414   LDLCALC 74 01/07/2018 1414   LDLDIRECT 130.9 11/16/2008 1013     Wt Readings from Last 3 Encounters:  04/05/20 196 lb (88.9 kg)  02/10/20 188 lb 3.2 oz (85.4 kg)  01/12/20 189 lb 9.6 oz (86 kg)     Other studies Reviewed: Additional  studies/ records that were reviewed today include: . Review of the above records demonstrates:    Assessment and Plan:   1. PVCs: She has rare palpitations. Continue Flecainide and Metoprolol.    2. CAD with stable angina: She is known to have moderate disease in the LAD, not felt to be flow limiting by cath in September 2017. She has no chest pain. Will continue ASA, beta blocker and statin.     3. Tobacco abuse, in remission: She stopped smoking in 2018.  4. Bicuspid aortic valve: No stenosis by echo in November 2020   Current medicines are reviewed at length with the patient today.  The patient does not have concerns regarding medicines.  The following changes have been made:    Labs/ tests ordered today include:   Orders Placed This Encounter  Procedures  . EKG 12-Lead    Disposition:   FU with me in 12 months  Signed, Lauree Chandler, MD 04/05/2020 4:05 PM    [facility] [place], [place], [place]  [address] Phone: [phone]; Fax: [phone]

## 2020-04-05 NOTE — Patient Instructions (Signed)

## 2020-04-09 ENCOUNTER — Ambulatory Visit
Admission: RE | Admit: 2020-04-09 | Discharge: 2020-04-09 | Disposition: A | Discharge status: Home / Self Care | Payer: Medicare Other | Source: Ambulatory Visit | Attending: Nephrology | Admitting: Nephrology

## 2020-04-09 ENCOUNTER — Other Ambulatory Visit: Payer: Self-pay

## 2020-04-09 DIAGNOSIS — R918 Other nonspecific abnormal finding of lung field: Secondary | ICD-10-CM | POA: Diagnosis not present

## 2020-04-14 ENCOUNTER — Other Ambulatory Visit: Payer: Self-pay | Admitting: Student

## 2020-04-14 DIAGNOSIS — M25431 Effusion, right wrist: Secondary | ICD-10-CM | POA: Diagnosis not present

## 2020-04-22 ENCOUNTER — Other Ambulatory Visit: Payer: Self-pay | Admitting: Orthopedic Surgery

## 2020-04-22 DIAGNOSIS — M25431 Effusion, right wrist: Secondary | ICD-10-CM

## 2020-05-06 ENCOUNTER — Other Ambulatory Visit: Payer: Self-pay | Admitting: Family Medicine

## 2020-05-07 NOTE — Telephone Encounter (Signed)
Last office visit---01/12/2020 Last refill--11/21/2019--306 tabs with 1 refill

## 2020-05-14 ENCOUNTER — Ambulatory Visit
Admission: RE | Admit: 2020-05-14 | Discharge: 2020-05-14 | Disposition: A | Discharge status: Home / Self Care | Payer: Medicare Other | Source: Ambulatory Visit | Attending: Orthopedic Surgery | Admitting: Orthopedic Surgery

## 2020-05-14 ENCOUNTER — Other Ambulatory Visit: Payer: Self-pay

## 2020-05-14 DIAGNOSIS — M25431 Effusion, right wrist: Secondary | ICD-10-CM

## 2020-05-14 DIAGNOSIS — S66911A Strain of unspecified muscle, fascia and tendon at wrist and hand level, right hand, initial encounter: Secondary | ICD-10-CM | POA: Diagnosis not present

## 2020-05-14 DIAGNOSIS — M25531 Pain in right wrist: Secondary | ICD-10-CM | POA: Diagnosis not present

## 2020-05-14 MED ORDER — IOPAMIDOL (ISOVUE-M 200) INJECTION 41%
2.0000 mL | Freq: Once | INTRAMUSCULAR | Status: AC
  Administered 2020-05-14: 2 mL via INTRA_ARTICULAR

## 2020-05-19 DIAGNOSIS — M1811 Unilateral primary osteoarthritis of first carpometacarpal joint, right hand: Secondary | ICD-10-CM | POA: Diagnosis not present

## 2020-05-19 DIAGNOSIS — G5603 Carpal tunnel syndrome, bilateral upper limbs: Secondary | ICD-10-CM | POA: Diagnosis not present

## 2020-05-19 DIAGNOSIS — M65351 Trigger finger, right little finger: Secondary | ICD-10-CM | POA: Diagnosis not present

## 2020-05-19 DIAGNOSIS — M65331 Trigger finger, right middle finger: Secondary | ICD-10-CM | POA: Diagnosis not present

## 2020-05-19 DIAGNOSIS — S63521A Sprain of radiocarpal joint of right wrist, initial encounter: Secondary | ICD-10-CM | POA: Diagnosis not present

## 2020-05-19 DIAGNOSIS — M65311 Trigger thumb, right thumb: Secondary | ICD-10-CM | POA: Diagnosis not present

## 2020-05-31 DIAGNOSIS — M2550 Pain in unspecified joint: Secondary | ICD-10-CM | POA: Diagnosis not present

## 2020-05-31 DIAGNOSIS — R5382 Chronic fatigue, unspecified: Secondary | ICD-10-CM | POA: Diagnosis not present

## 2020-05-31 DIAGNOSIS — M79641 Pain in right hand: Secondary | ICD-10-CM | POA: Diagnosis not present

## 2020-05-31 DIAGNOSIS — M79642 Pain in left hand: Secondary | ICD-10-CM | POA: Diagnosis not present

## 2020-06-07 DIAGNOSIS — H43813 Vitreous degeneration, bilateral: Secondary | ICD-10-CM | POA: Diagnosis not present

## 2020-06-07 DIAGNOSIS — H35033 Hypertensive retinopathy, bilateral: Secondary | ICD-10-CM | POA: Diagnosis not present

## 2020-06-07 DIAGNOSIS — H2513 Age-related nuclear cataract, bilateral: Secondary | ICD-10-CM | POA: Diagnosis not present

## 2020-06-08 ENCOUNTER — Other Ambulatory Visit: Payer: Self-pay | Admitting: Student

## 2020-06-08 ENCOUNTER — Other Ambulatory Visit: Payer: Self-pay | Admitting: Internal Medicine

## 2020-06-09 NOTE — Telephone Encounter (Signed)
Last lipid labs- 01/07/2018 Last office visit--02/10/2020   No future visit scheduled

## 2020-06-09 NOTE — Telephone Encounter (Signed)
Pt's pharmacy is requesting a refill on magnesium oxide. Would Dr. Angelena Form like to refill this medication? Please address

## 2020-06-10 ENCOUNTER — Other Ambulatory Visit: Payer: Self-pay | Admitting: Internal Medicine

## 2020-06-21 DIAGNOSIS — H2513 Age-related nuclear cataract, bilateral: Secondary | ICD-10-CM | POA: Diagnosis not present

## 2020-06-21 DIAGNOSIS — H2511 Age-related nuclear cataract, right eye: Secondary | ICD-10-CM | POA: Diagnosis not present

## 2020-07-05 ENCOUNTER — Other Ambulatory Visit: Payer: Self-pay | Admitting: Internal Medicine

## 2020-07-05 ENCOUNTER — Other Ambulatory Visit: Payer: Self-pay | Admitting: Student

## 2020-07-09 DIAGNOSIS — N1832 Chronic kidney disease, stage 3b: Secondary | ICD-10-CM | POA: Diagnosis not present

## 2020-07-19 DIAGNOSIS — M79641 Pain in right hand: Secondary | ICD-10-CM | POA: Diagnosis not present

## 2020-07-19 DIAGNOSIS — R5382 Chronic fatigue, unspecified: Secondary | ICD-10-CM | POA: Diagnosis not present

## 2020-07-19 DIAGNOSIS — M2550 Pain in unspecified joint: Secondary | ICD-10-CM | POA: Diagnosis not present

## 2020-07-19 DIAGNOSIS — M0609 Rheumatoid arthritis without rheumatoid factor, multiple sites: Secondary | ICD-10-CM | POA: Diagnosis not present

## 2020-07-19 DIAGNOSIS — M79642 Pain in left hand: Secondary | ICD-10-CM | POA: Diagnosis not present

## 2020-07-26 DIAGNOSIS — N1832 Chronic kidney disease, stage 3b: Secondary | ICD-10-CM | POA: Diagnosis not present

## 2020-07-26 DIAGNOSIS — I2510 Atherosclerotic heart disease of native coronary artery without angina pectoris: Secondary | ICD-10-CM | POA: Diagnosis not present

## 2020-07-26 DIAGNOSIS — I129 Hypertensive chronic kidney disease with stage 1 through stage 4 chronic kidney disease, or unspecified chronic kidney disease: Secondary | ICD-10-CM | POA: Diagnosis not present

## 2020-07-26 DIAGNOSIS — E559 Vitamin D deficiency, unspecified: Secondary | ICD-10-CM | POA: Diagnosis not present

## 2020-07-26 DIAGNOSIS — J449 Chronic obstructive pulmonary disease, unspecified: Secondary | ICD-10-CM | POA: Diagnosis not present

## 2020-08-05 DIAGNOSIS — H2511 Age-related nuclear cataract, right eye: Secondary | ICD-10-CM | POA: Diagnosis not present

## 2020-08-06 DIAGNOSIS — H2512 Age-related nuclear cataract, left eye: Secondary | ICD-10-CM | POA: Diagnosis not present

## 2020-08-19 DIAGNOSIS — H2512 Age-related nuclear cataract, left eye: Secondary | ICD-10-CM | POA: Diagnosis not present

## 2020-09-15 ENCOUNTER — Other Ambulatory Visit: Payer: Self-pay | Admitting: Family Medicine

## 2020-10-05 ENCOUNTER — Other Ambulatory Visit: Payer: Self-pay | Admitting: Family Medicine

## 2020-10-11 ENCOUNTER — Other Ambulatory Visit: Payer: Self-pay | Admitting: Family Medicine

## 2020-10-29 DIAGNOSIS — M79642 Pain in left hand: Secondary | ICD-10-CM | POA: Diagnosis not present

## 2020-10-29 DIAGNOSIS — M2550 Pain in unspecified joint: Secondary | ICD-10-CM | POA: Diagnosis not present

## 2020-10-29 DIAGNOSIS — M0609 Rheumatoid arthritis without rheumatoid factor, multiple sites: Secondary | ICD-10-CM | POA: Diagnosis not present

## 2020-10-29 DIAGNOSIS — M79641 Pain in right hand: Secondary | ICD-10-CM | POA: Diagnosis not present

## 2020-10-29 DIAGNOSIS — R5382 Chronic fatigue, unspecified: Secondary | ICD-10-CM | POA: Diagnosis not present

## 2020-11-20 ENCOUNTER — Other Ambulatory Visit: Payer: Self-pay | Admitting: Family Medicine

## 2020-12-06 ENCOUNTER — Other Ambulatory Visit: Payer: Self-pay | Admitting: Family Medicine

## 2021-01-24 DIAGNOSIS — R918 Other nonspecific abnormal finding of lung field: Secondary | ICD-10-CM | POA: Diagnosis not present

## 2021-01-24 DIAGNOSIS — E279 Disorder of adrenal gland, unspecified: Secondary | ICD-10-CM | POA: Diagnosis not present

## 2021-01-24 DIAGNOSIS — I5032 Chronic diastolic (congestive) heart failure: Secondary | ICD-10-CM | POA: Diagnosis not present

## 2021-01-24 DIAGNOSIS — I129 Hypertensive chronic kidney disease with stage 1 through stage 4 chronic kidney disease, or unspecified chronic kidney disease: Secondary | ICD-10-CM | POA: Diagnosis not present

## 2021-01-24 DIAGNOSIS — M159 Polyosteoarthritis, unspecified: Secondary | ICD-10-CM | POA: Diagnosis not present

## 2021-01-24 DIAGNOSIS — I2510 Atherosclerotic heart disease of native coronary artery without angina pectoris: Secondary | ICD-10-CM | POA: Diagnosis not present

## 2021-01-24 DIAGNOSIS — N1832 Chronic kidney disease, stage 3b: Secondary | ICD-10-CM | POA: Diagnosis not present

## 2021-01-24 DIAGNOSIS — E559 Vitamin D deficiency, unspecified: Secondary | ICD-10-CM | POA: Diagnosis not present

## 2021-01-24 DIAGNOSIS — K219 Gastro-esophageal reflux disease without esophagitis: Secondary | ICD-10-CM | POA: Diagnosis not present

## 2021-02-07 ENCOUNTER — Other Ambulatory Visit: Payer: Self-pay | Admitting: Family Medicine

## 2021-02-07 NOTE — Telephone Encounter (Signed)
Pt state not to send refill, she has appointment to discuss about changing Rx to something stronger

## 2021-02-14 ENCOUNTER — Ambulatory Visit (INDEPENDENT_AMBULATORY_CARE_PROVIDER_SITE_OTHER): Payer: Medicare Other | Admitting: Family Medicine

## 2021-02-14 ENCOUNTER — Encounter: Payer: Self-pay | Admitting: Family Medicine

## 2021-02-14 VITALS — BP 146/108 | HR 80 | Temp 97.9°F | Wt 199.0 lb

## 2021-02-14 DIAGNOSIS — F411 Generalized anxiety disorder: Secondary | ICD-10-CM | POA: Diagnosis not present

## 2021-02-14 DIAGNOSIS — F321 Major depressive disorder, single episode, moderate: Secondary | ICD-10-CM | POA: Diagnosis not present

## 2021-02-14 MED ORDER — ALBUTEROL SULFATE HFA 108 (90 BASE) MCG/ACT IN AERS
2.0000 | INHALATION_SPRAY | Freq: Four times a day (QID) | RESPIRATORY_TRACT | 11 refills | Status: DC | PRN
Start: 2021-02-14 — End: 2023-05-14

## 2021-02-14 MED ORDER — VENLAFAXINE HCL ER 75 MG PO CP24: 75.0000 mg | ORAL_CAPSULE | Freq: Every day | ORAL | 2 refills | Status: DC

## 2021-02-14 NOTE — Progress Notes (Signed)
   Subjective:    Patient ID: [name] [name], female    DOB: 1950-04-30, 70 y.o.   MRN: [id number]  HPI Here to follow up on depression and anxiety. She has been taking Wellbutrin for some years now, and it does not seem to help as much as it used to. She feels tired and unmotivated most of the time. She gets sad, but not tearful. Now that her roommate of 30 years has moved to a nursing facility in [place], [name] is alone all the time. Her appetite is good but she has trouble sleeping. She took Zoloft some years ago.    Review of Systems  Constitutional:  Positive for fatigue. Negative for appetite change and unexpected weight change.  Respiratory: Negative.    Cardiovascular: Negative.   Psychiatric/Behavioral:  Positive for decreased concentration, dysphoric mood and sleep disturbance. Negative for agitation, behavioral problems, confusion, hallucinations, self-injury and suicidal ideas. The patient is nervous/anxious.       Objective:   Physical Exam Constitutional:      Appearance: Normal appearance.  Cardiovascular:     Rate and Rhythm: Normal rate and regular rhythm.  Neurological:     Mental Status: She is alert.  Psychiatric:        Behavior: Behavior normal.        Thought Content: Thought content normal.     Comments: Affect is depressed           Assessment & Plan:  Her depression has worsened a bit. We will stay on Wellbutrin XL 300 mg daily but we will add Effexor XR 75 mg daily to it. Recheck in one month.  Alysia Penna, MD

## 2021-02-28 ENCOUNTER — Telehealth: Payer: Self-pay

## 2021-02-28 NOTE — Telephone Encounter (Signed)
Patient was prescribed Effexor XR 75mg . Please advise

## 2021-02-28 NOTE — Telephone Encounter (Signed)
Patient called stating that the last Rx that was prescribed made her violently sick and would like another Rx for antidepressant

## 2021-03-01 ENCOUNTER — Other Ambulatory Visit: Payer: Self-pay

## 2021-03-01 MED ORDER — SERTRALINE HCL 50 MG PO TABS: 50.0000 mg | ORAL_TABLET | Freq: Every day | ORAL | 2 refills | Status: DC

## 2021-03-01 NOTE — Telephone Encounter (Signed)
Cancel the Effexor. Instead call in Zoloft 50 mg to take once a day, #30 with 2 rf. This is in addition to her Wellbutrin

## 2021-03-01 NOTE — Telephone Encounter (Signed)
Spoke with pt advised of Dr Sarajane Jews recommendation, verbalized understanding.Rx for Effexor was d/c and a new Rx for Zoloft was sent to pt pharmacy

## 2021-03-03 ENCOUNTER — Ambulatory Visit (INDEPENDENT_AMBULATORY_CARE_PROVIDER_SITE_OTHER): Payer: Medicare Other

## 2021-03-03 VITALS — Ht 60.0 in | Wt 199.0 lb

## 2021-03-03 DIAGNOSIS — Z0000 Encounter for general adult medical examination without abnormal findings: Secondary | ICD-10-CM

## 2021-03-03 NOTE — Progress Notes (Signed)
Subjective:   [name] [name] is a 70 y.o. female who presents for Medicare Annual (Subsequent) preventive examination.  Review of Systems    No ROS      Objective:    Today's Vitals   03/03/21 1442  Weight: 199 lb (90.3 kg)  Height: 5' (1.524 m)   Body mass index is 38.86 kg/m.  Advanced Directives 03/03/2021 03/02/2020 06/08/2016 06/07/2016 12/03/2015 11/12/2014 10/16/2014  Does Patient Have a Medical Advance Directive? No No No No No No No  Does patient want to make changes to medical advance directive? - No - Patient declined - - - - -  Would patient like information on creating a medical advance directive? No - Patient declined - No - Patient declined No - Patient declined - No - patient declined information -    Current Medications (verified) Outpatient Encounter Medications as of 03/03/2021  Medication Sig   albuterol (PROAIR HFA) 108 (90 Base) MCG/ACT inhaler Inhale 2 puffs into the lungs every 6 (six) hours as needed for wheezing.   allopurinol (ZYLOPRIM) 100 MG tablet Take 1 tablet (100 mg total) by mouth daily.   ALPRAZolam (XANAX) 0.25 MG tablet TAKE 3 TABLETS BY MOUTH EVERY 6 HOURS AS NEEDED   aspirin EC 81 MG tablet Take 1 tablet (81 mg total) by mouth daily.   atorvastatin (LIPITOR) 20 MG tablet TAKE 1 TABLET BY MOUTH EVERY DAY   buPROPion (WELLBUTRIN XL) 300 MG 24 hr tablet TAKE 1 TABLET BY MOUTH EVERY DAY   cetirizine (ZYRTEC) 10 MG tablet Take 10 mg by mouth daily as needed for allergies.    cholecalciferol (VITAMIN D3) 25 MCG (1000 UNIT) tablet Take 1,000 Units by mouth daily.   flecainide (TAMBOCOR) 50 MG tablet Take 1 tablet (50 mg total) by mouth 3 (three) times daily.   lisinopril (ZESTRIL) 10 MG tablet Take 10 mg by mouth daily.   Magnesium 400 MG CAPS Take 400 mg by mouth daily.   Magnesium Oxide -Mg Supplement 400 MG CAPS TAKE 1 CAPSULE BY MOUTH EVERY DAY   metoprolol tartrate (LOPRESSOR) 50 MG tablet Take 50 mg by mouth 2 (two) times daily.    nitroGLYCERIN (NITROSTAT) 0.4 MG SL tablet Place 1 tablet (0.4 mg total) under the tongue every 5 (five) minutes as needed for chest pain.   pantoprazole (PROTONIX) 40 MG tablet TAKE 1 TABLET BY MOUTH TWICE A DAY   predniSONE (DELTASONE) 10 MG tablet Take 6 tabs a day for 5 days, then 5 a day for 5 days, then 4 a day for 5 days, then 3 a day for 5 days, then 2 a day for 5 days, then 1 a day for 5 days, then stop   sertraline (ZOLOFT) 50 MG tablet Take 1 tablet (50 mg total) by mouth daily.   triamcinolone cream (KENALOG) 0.1 % Apply 1 application topically 2 (two) times daily.   No facility-administered encounter medications on file as of 03/03/2021.    Allergies (verified) Augmentin [amoxicillin-pot clavulanate], Clindamycin/lincomycin, and Colchicine   History: Past Medical History:  Diagnosis Date   Allergic rhinitis    ALLERGIC RHINITIS 05/08/2007   Qualifier: Diagnosis of  By: Sherlynn Stalls, CMA, Cindy     Allergy    seasonal   Anxiety    Anxiety state 05/25/2014   Aortic valve disease    a. ? possible bicuspid AV.   CAD (coronary artery disease)    a. 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 mild dilated aortic root on prior echo, ascending aorta prominence on CT 2015.   Duodenal mass    GERD 05/08/2007   Qualifier: Diagnosis of  By: Sherlynn Stalls, CMA, Cindy     Hyperlipidemia    Hypertension    Insomnia    Neck pain, chronic 03/09/2016   NICOTINE ADDICTION 05/08/2007   Qualifier: Diagnosis of   By: Sarajane Jews MD, Annie Main A    Osteoarthritis    Overactive bladder    Pneumonia    once or twivce in past   Premature ventricular contractions    LBBB inferior axis PVCs   Thoracic back pain 05/30/2014   Tobacco abuse    Ulcer    duodenal   Past Surgical History:  Procedure Laterality Date   BREAST BIOPSY     CARDIAC CATHETERIZATION N/A 12/03/2015   Procedure: Left Heart Cath and Coronary Angiography;  Surgeon: Burnell Blanks, MD;  Location: [facility];  Service: Cardiovascular;  Laterality: N/A;   CARPAL TUNNEL RELEASE Left    1997 left,     CHOLECYSTECTOMY N/A 03/23/2015   Procedure: LAPAROSCOPIC CHOLECYSTECTOMY;  Surgeon: Ralene Ok, MD;  Location: [facility];  Service: General;  Laterality: N/A;   COLONOSCOPY  10/17/2013   per Dr. Olevia Perches, benign polyps, repeat in 10 years   EUS N/A 11/12/2014   Procedure: UPPER ENDOSCOPIC ULTRASOUND (EUS) LINEAR;  Surgeon: Milus Banister, MD;  Location: [facility];  Service: Endoscopy;  Laterality: N/A;   EUS N/A 06/08/2016   Procedure: UPPER ENDOSCOPIC ULTRASOUND (EUS) RADIAL;  Surgeon: Milus Banister, MD;  Location: [facility];  Service: Endoscopy;  Laterality: N/A;   EYE SURGERY     LEFT AND RIGHT HEART CATHETERIZATION WITH CORONARY ANGIOGRAM N/A 04/22/2014   Procedure: LEFT AND RIGHT HEART CATHETERIZATION WITH CORONARY ANGIOGRAM;  Surgeon: Burnell Blanks, MD;  Location: [facility];  Service: Cardiovascular;  Laterality: N/A;   TONSILLECTOMY  1950   torn cartilage repaired rt wrist Right 2003   Family History  Problem Relation Age of Onset   CAD Brother    Kidney disease Brother    Emphysema Mother    Other Father        brain tumor   Throat cancer Brother    Hyperlipidemia Other    Hypertension Other    Kidney disease Other    Heart disease Other    Colon cancer Neg Hx    Esophageal cancer Neg Hx    Stomach cancer Neg Hx    Rectal cancer Neg Hx    Social History   Socioeconomic History   Marital  status: Single    Spouse name: Not on file   Number of children: 0   Years of education: Not on file   Highest education level: Not on file  Occupational History   Occupation: CNA  Tobacco Use   Smoking status: Former    Packs/day: 0.45    Years: 54.00    Pack years:  24.30    Types: Cigarettes    Quit date: 01/01/2017    Years since quitting: 4.1   Smokeless tobacco: Never   Tobacco comments:    1/2 pack per day or less  Vaping Use   Vaping Use: Never used  Substance and Sexual Activity   Alcohol use: No    Alcohol/week: 0.0 standard drinks   Drug use: No   Sexual activity: Not on file  Other Topics Concern   Not on file  Social History Narrative   Works as Quarry manager at [employer].   Social Determinants of Health   Financial Resource Strain: Low Risk    Difficulty of Paying Living Expenses: Not hard at all  Food Insecurity: No Food Insecurity   Worried About Charity fundraiser in the Last Year: Never true   [place] in the Last Year: Never true  Transportation Needs: No Transportation Needs   Lack of Transportation (Medical): No   Lack of Transportation (Non-Medical): No  Physical Activity: Insufficiently Active   Days of Exercise per Week: 5 days   Minutes of Exercise per Session: 10 min  Stress: No Stress Concern Present   Feeling of Stress : Not at all  Social Connections: Socially Isolated   Frequency of Communication with Friends and Family: Once a week   Frequency of Social Gatherings with Friends and Family: Never   Attends Religious Services: Never   Marine scientist or Organizations: No   Attends Music therapist: Never   Marital Status: Never married    Clinical Intake:  Pre-visit preparation completed: Yes  Diabetes: No  How often do you need to have someone help you when you read instructions, pamphlets, or other written materials from your doctor or pharmacy?: 1 - Never  Diabetic? No  Interpreter Needed?: No    Activities of Daily Living In your present state of health, do you have any difficulty performing the following activities: 03/03/2021  Hearing? N  Vision? N  Comment Wears glasses. Followed by Dr Clydene Laming  Difficulty concentrating or making decisions? N  Some recent data might be hidden    Patient Care Team: [name], MD as PCP - General Angelena Form Annita Brod, MD as PCP - Cardiology (Cardiology) Thompson Grayer, MD as PCP - Electrophysiology (Cardiology) Viona Gilmore, [facility] as Pharmacist (Pharmacist)  Indicate any recent Medical Services you may have received from other than [facility] providers in the past year (date may be approximate).     Assessment:   This is a routine wellness examination for [place].  Virtual Visit via Telephone Note  I connected with  Ahjanae Cassel on 03/03/21 at  2:30 PM EST by telephone and verified that I am speaking with the correct person using two identifiers.  Location: Patient: Home Provider: Office Persons participating in the virtual visit: patient/Nurse Health Advisor   I discussed the limitations, risks, security and privacy concerns of performing an evaluation and management service by telephone and the availability of in person appointments. The patient expressed understanding and agreed to proceed.  Interactive audio and video telecommunications were attempted between this nurse and patient, however failed, due to patient having technical difficulties OR patient did not have access to video capability.  We continued and completed visit with audio only.  Some vital signs may be absent or patient reported.   Criselda Peaches, LPN   Hearing/Vision screen Hearing Screening - Comments:: No difficulty hearing  Vision Screening - Comments::  Wears glasses.Followed by Dr Clydene Laming  Dietary issues and exercise activities discussed:     Goals Addressed             This Visit's Progress    Exercise 3x per week (30 min per time)          Depression Screen PHQ 2/9 Scores 03/03/2021 03/03/2021 03/03/2021 02/15/2021 03/02/2020 02/10/2020 12/18/2017  PHQ - 2 Score 2 0 0 6 2 2 1   PHQ- 9 Score 5 0 0 19 10 10  -    Fall Risk Fall Risk  03/03/2021 03/03/2021 03/02/2020 02/10/2020 12/18/2017  Falls in the past year? 0 0 1 1 Yes  Number falls in past yr: 0 - 1 1 2  or more  Injury with Fall? 0 - 1 1 Yes  Risk for fall due to : - - History of fall(s) - -  Follow up - - Falls evaluation completed;Falls prevention discussed Falls evaluation completed -    FALL RISK PREVENTION PERTAINING TO THE HOME:  Any stairs in or around the home? Yes  If so, are there any without handrails? No  Home free of loose throw rugs in walkways, pet beds, electrical cords, etc? Yes  Adequate lighting in your home to reduce risk of falls? Yes   ASSISTIVE DEVICES UTILIZED TO PREVENT FALLS:  Life alert? No  Use of a cane, walker or w/c? No  Grab bars in the bathroom? No  Shower chair or bench in shower? No  Elevated toilet seat or a handicapped toilet? No   TIMED UP AND GO:  Was the test performed? No . Audio Visit  Cognitive Function:   Montreal Cognitive Assessment  07/20/2016  Visuospatial/ Executive (0/5) 4  Naming (0/3) 3  Attention: Read list of digits (0/2) 2  Attention: Read list of letters (0/1) 1  Attention: Serial 7 subtraction starting at 100 (0/3) 3  Language: Repeat phrase (0/2) 2  Language : Fluency (0/1) 0  Abstraction (0/2) 2  Delayed Recall (0/5) 0  Orientation (0/6) 6  Total 23  Adjusted Score (based on education) 24   6CIT Screen 03/03/2021  What Year? 0 points  What month? 0 points  What time? 0 points  Count back from 20 0 points  Months in reverse 0 points  Repeat phrase 0 points  Total Score 0    Immunizations Immunization History  Administered Date(s) Administered   Influenza, High Dose Seasonal PF 02/22/2015, 02/02/2016, 01/23/2017, 01/07/2018   Influenza-Unspecified 02/01/2014   Pneumococcal Conjugate-13  01/07/2018   Tdap 12/10/2017     Qualifies for Shingles Vaccine? Yes   Zostavax completed No   Shingrix Completed?: No.    Education has been provided regarding the importance of this vaccine. Patient has been advised to call insurance company to determine out of pocket expense if they have not yet received this vaccine. Advised may also receive vaccine at local pharmacy or Health Dept. Verbalized acceptance and understanding.  Screening Tests Health Maintenance  Topic Date Due   COVID-19 Vaccine (1) 03/19/2021 (Originally 08/05/1951)   Zoster Vaccines- Shingrix (1 of 2) 06/01/2021 (Originally 02/06/1970)   INFLUENZA VACCINE  07/01/2021 (Originally 11/01/2020)   Pneumonia Vaccine 65+ Years old (2 - PPSV23 if available, else PCV20) 03/03/2022 (Originally 01/08/2019)   MAMMOGRAM  03/03/2022 (Originally 02/15/2020)   DEXA SCAN  03/03/2022 (Originally 02/05/2014)   Hepatitis C Screening  03/03/2022 (Originally 02/06/1969)   COLONOSCOPY (Pts 45-89yrs Insurance coverage will need to be confirmed)  10/18/2023   TETANUS/TDAP  12/11/2027  HPV VACCINES  Aged Out    Health Maintenance  There are no preventive care reminders to display for this patient.   Vision Screening: Recommended annual ophthalmology exams for early detection of glaucoma and other disorders of the eye. Is the patient up to date with their annual eye exam?  Yes  Who is the provider or what is the name of the office in which the patient attends annual eye exams? Followed by Dr Clydene Laming   Dental Screening: Recommended annual dental exams for proper oral hygiene  Community Resource Referral / Chronic Care Management:  CRR required this visit?  No   CCM required this visit?  No      Plan:     I have personally reviewed and noted the following in the patient's chart:   Medical and social history Use of alcohol, tobacco or illicit drugs  Current medications and supplements including opioid prescriptions. Currently not  taking opioids Functional ability and status Nutritional status Physical activity Advanced directives List of other physicians Hospitalizations, surgeries, and ER visits in previous 12 months Vitals Screenings to include cognitive, depression, and falls Referrals and appointments  In addition, I have reviewed and discussed with patient certain preventive protocols, quality metrics, and best practice recommendations. A written personalized care plan for preventive services as well as general preventive health recommendations were provided to patient.     Criselda Peaches, LPN   [id number]/04/[id number]

## 2021-03-03 NOTE — Patient Instructions (Addendum)
Ms. [name] [name] , Thank you for taking time to come for your Medicare Wellness Visit. I appreciate your ongoing commitment to your health goals. Please review the following plan we discussed and let me know if I can assist you in the future.   These are the goals we discussed:  Goals      Exercise 3x per week (30 min per time)        This is a list of the screening recommended for you and due dates:  Health Maintenance  Topic Date Due   COVID-19 Vaccine (1) 03/19/2021*   Zoster (Shingles) Vaccine (1 of 2) 06/01/2021*   Flu Shot  07/01/2021*   Pneumonia Vaccine (2 - PPSV23 if available, else PCV20) 03/03/2022*   Mammogram  03/03/2022*   DEXA scan (bone density measurement)  03/03/2022*   Hepatitis C Screening: USPSTF Recommendation to screen - Ages 18-79 yo.  03/03/2022*   Colon Cancer Screening  10/18/2023   Tetanus Vaccine  12/11/2027   HPV Vaccine  Aged Out  *Topic was postponed. The date shown is not the original due date.   Advanced directives: No   Conditions/risks identified: None  Next appointment: Follow up in one year for your annual wellness visit    Preventive Care 65 Years and Older, Female Preventive care refers to lifestyle choices and visits with your health care provider that can promote health and wellness. What does preventive care include? A yearly physical exam. This is also called an annual well check. Dental exams once or twice a year. Routine eye exams. Ask your health care provider how often you should have your eyes checked. Personal lifestyle choices, including: Daily care of your teeth and gums. Regular physical activity. Eating a healthy diet. Avoiding tobacco and drug use. Limiting alcohol use. Practicing safe sex. Taking low-dose aspirin every day. Taking vitamin and mineral supplements as recommended by your health care provider. What happens during an annual well check? The services and screenings done by your health care provider during  your annual well check will depend on your age, overall health, lifestyle risk factors, and family history of disease. Counseling  Your health care provider may ask you questions about your: Alcohol use. Tobacco use. Drug use. Emotional well-being. Home and relationship well-being. Sexual activity. Eating habits. History of falls. Memory and ability to understand (cognition). Work and work Statistician. Reproductive health. Screening  You may have the following tests or measurements: Height, weight, and BMI. Blood pressure. Lipid and cholesterol levels. These may be checked every 5 years, or more frequently if you are over 103 years old. Skin check. Lung cancer screening. You may have this screening every year starting at age 56 if you have a 30-pack-year history of smoking and currently smoke or have quit within the past 15 years. Fecal occult blood test (FOBT) of the stool. You may have this test every year starting at age 70. Flexible sigmoidoscopy or colonoscopy. You may have a sigmoidoscopy every 5 years or a colonoscopy every 10 years starting at age 81. Hepatitis C blood test. Hepatitis B blood test. Sexually transmitted disease (STD) testing. Diabetes screening. This is done by checking your blood sugar (glucose) after you have not eaten for a while (fasting). You may have this done every 1-3 years. Bone density scan. This is done to screen for osteoporosis. You may have this done starting at age 41. Mammogram. This may be done every 1-2 years. Talk to your health care provider about how often you should have  regular mammograms. Talk with your health care provider about your test results, treatment options, and if necessary, the need for more tests. Vaccines  Your health care provider may recommend certain vaccines, such as: Influenza vaccine. This is recommended every year. Tetanus, diphtheria, and acellular pertussis (Tdap, Td) vaccine. You may need a Td booster every 10  years. Zoster vaccine. You may need this after age 33. Pneumococcal 13-valent conjugate (PCV13) vaccine. One dose is recommended after age 76. Pneumococcal polysaccharide (PPSV23) vaccine. One dose is recommended after age 11. Talk to your health care provider about which screenings and vaccines you need and how often you need them. This information is not intended to replace advice given to you by your health care provider. Make sure you discuss any questions you have with your health care provider. Document Released: 04/16/2015 Document Revised: 12/08/2015 Document Reviewed: 01/19/2015 Elsevier Interactive Patient Education  2017 [place] Prevention in the Home Falls can cause injuries. They can happen to people of all ages. There are many things you can do to make your home safe and to help prevent falls. What can I do on the outside of my home? Regularly fix the edges of walkways and driveways and fix any cracks. Remove anything that might make you trip as you walk through a door, such as a raised step or threshold. Trim any bushes or trees on the path to your home. Use bright outdoor lighting. Clear any walking paths of anything that might make someone trip, such as rocks or tools. Regularly check to see if handrails are loose or broken. Make sure that both sides of any steps have handrails. Any raised decks and porches should have guardrails on the edges. Have any leaves, snow, or ice cleared regularly. Use sand or salt on walking paths during winter. Clean up any spills in your garage right away. This includes oil or grease spills. What can I do in the bathroom? Use night lights. Install grab bars by the toilet and in the tub and shower. Do not use towel bars as grab bars. Use non-skid mats or decals in the tub or shower. If you need to sit down in the shower, use a plastic, non-slip stool. Keep the floor dry. Clean up any water that spills on the floor as soon as it  happens. Remove soap buildup in the tub or shower regularly. Attach bath mats securely with double-sided non-slip rug tape. Do not have throw rugs and other things on the floor that can make you trip. What can I do in the bedroom? Use night lights. Make sure that you have a light by your bed that is easy to reach. Do not use any sheets or blankets that are too big for your bed. They should not hang down onto the floor. Have a firm chair that has side arms. You can use this for support while you get dressed. Do not have throw rugs and other things on the floor that can make you trip. What can I do in the kitchen? Clean up any spills right away. Avoid walking on wet floors. Keep items that you use a lot in easy-to-reach places. If you need to reach something above you, use a strong step stool that has a grab bar. Keep electrical cords out of the way. Do not use floor polish or wax that makes floors slippery. If you must use wax, use non-skid floor wax. Do not have throw rugs and other things on the floor that  can make you trip. What can I do with my stairs? Do not leave any items on the stairs. Make sure that there are handrails on both sides of the stairs and use them. Fix handrails that are broken or loose. Make sure that handrails are as long as the stairways. Check any carpeting to make sure that it is firmly attached to the stairs. Fix any carpet that is loose or worn. Avoid having throw rugs at the top or bottom of the stairs. If you do have throw rugs, attach them to the floor with carpet tape. Make sure that you have a light switch at the top of the stairs and the bottom of the stairs. If you do not have them, ask someone to add them for you. What else can I do to help prevent falls? Wear shoes that: Do not have high heels. Have rubber bottoms. Are comfortable and fit you well. Are closed at the toe. Do not wear sandals. If you use a stepladder: Make sure that it is fully opened.  Do not climb a closed stepladder. Make sure that both sides of the stepladder are locked into place. Ask someone to hold it for you, if possible. Clearly mark and make sure that you can see: Any grab bars or handrails. First and last steps. Where the edge of each step is. Use tools that help you move around (mobility aids) if they are needed. These include: Canes. Walkers. Scooters. Crutches. Turn on the lights when you go into a dark area. Replace any light bulbs as soon as they burn out. Set up your furniture so you have a clear path. Avoid moving your furniture around. If any of your floors are uneven, fix them. If there are any pets around you, be aware of where they are. Review your medicines with your doctor. Some medicines can make you feel dizzy. This can increase your chance of falling. Ask your doctor what other things that you can do to help prevent falls. This information is not intended to replace advice given to you by your health care provider. Make sure you discuss any questions you have with your health care provider. Document Released: 01/14/2009 Document Revised: 08/26/2015 Document Reviewed: 04/24/2014 Elsevier Interactive Patient Education  2017 [employer].

## 2021-03-10 DIAGNOSIS — H43813 Vitreous degeneration, bilateral: Secondary | ICD-10-CM | POA: Diagnosis not present

## 2021-03-10 DIAGNOSIS — H35033 Hypertensive retinopathy, bilateral: Secondary | ICD-10-CM | POA: Diagnosis not present

## 2021-03-10 DIAGNOSIS — H01009 Unspecified blepharitis unspecified eye, unspecified eyelid: Secondary | ICD-10-CM | POA: Diagnosis not present

## 2021-03-10 DIAGNOSIS — H538 Other visual disturbances: Secondary | ICD-10-CM | POA: Diagnosis not present

## 2021-03-16 ENCOUNTER — Encounter: Payer: Self-pay | Admitting: Family Medicine

## 2021-03-16 ENCOUNTER — Ambulatory Visit (INDEPENDENT_AMBULATORY_CARE_PROVIDER_SITE_OTHER): Payer: Medicare Other | Admitting: Family Medicine

## 2021-03-16 VITALS — BP 136/84 | HR 72 | Temp 99.0°F | Ht 60.0 in | Wt 198.5 lb

## 2021-03-16 DIAGNOSIS — K219 Gastro-esophageal reflux disease without esophagitis: Secondary | ICD-10-CM

## 2021-03-16 DIAGNOSIS — R918 Other nonspecific abnormal finding of lung field: Secondary | ICD-10-CM | POA: Diagnosis not present

## 2021-03-16 DIAGNOSIS — M5441 Lumbago with sciatica, right side: Secondary | ICD-10-CM | POA: Diagnosis not present

## 2021-03-16 DIAGNOSIS — M159 Polyosteoarthritis, unspecified: Secondary | ICD-10-CM | POA: Diagnosis not present

## 2021-03-16 DIAGNOSIS — I25119 Atherosclerotic heart disease of native coronary artery with unspecified angina pectoris: Secondary | ICD-10-CM

## 2021-03-16 DIAGNOSIS — I10 Essential (primary) hypertension: Secondary | ICD-10-CM | POA: Diagnosis not present

## 2021-03-16 DIAGNOSIS — R739 Hyperglycemia, unspecified: Secondary | ICD-10-CM | POA: Diagnosis not present

## 2021-03-16 DIAGNOSIS — J449 Chronic obstructive pulmonary disease, unspecified: Secondary | ICD-10-CM | POA: Diagnosis not present

## 2021-03-16 DIAGNOSIS — E782 Mixed hyperlipidemia: Secondary | ICD-10-CM | POA: Diagnosis not present

## 2021-03-16 DIAGNOSIS — G8929 Other chronic pain: Secondary | ICD-10-CM

## 2021-03-16 DIAGNOSIS — F411 Generalized anxiety disorder: Secondary | ICD-10-CM

## 2021-03-16 DIAGNOSIS — M5442 Lumbago with sciatica, left side: Secondary | ICD-10-CM

## 2021-03-16 DIAGNOSIS — F321 Major depressive disorder, single episode, moderate: Secondary | ICD-10-CM | POA: Diagnosis not present

## 2021-03-16 MED ORDER — BUPROPION HCL ER (XL) 300 MG PO TB24: 300.0000 mg | ORAL_TABLET | Freq: Every day | ORAL | 3 refills | Status: DC

## 2021-03-16 MED ORDER — SERTRALINE HCL 100 MG PO TABS
100.0000 mg | ORAL_TABLET | Freq: Every day | ORAL | 3 refills | Status: DC
Start: 2021-03-16 — End: 2022-04-04

## 2021-03-16 MED ORDER — LEFLUNOMIDE 20 MG PO TABS: 20.0000 mg | ORAL_TABLET | Freq: Every day | ORAL | 0 refills | Status: DC

## 2021-03-16 NOTE — Progress Notes (Signed)
Subjective:    Patient ID: [name] [name], female    DOB: 01/02/1951, 70 y.o.   MRN: [id number]  HPI Here to follow up on issues. Last month we discussed her depression and anxiety, and we talked about changing her medications. She is on Wellbutrin XL 300 mg daily and Sertraline 50 mg daily. Her anxiety has been more of a problem so she wants to revisit this today. She sees Leafy Kindle for care of her RA, and she is currently on  a trial of Shalimar. She saw her eye doctor, Dr. Clydene Laming, on 03-10-21 for infection in both eyes. He gave her Erythromycin oointment, and this is working nicely. We note that her chest CT last January showed some lung nodules, and a 12-18 month follow up study was recommended. This CT also showed calcifications in her aorta and coronary arteries.    Review of Systems  Constitutional: Negative.   HENT: Negative.    Eyes: Negative.   Respiratory: Negative.    Cardiovascular: Negative.   Gastrointestinal: Negative.   Genitourinary:  Negative for decreased urine volume, difficulty urinating, dyspareunia, dysuria, enuresis, flank pain, frequency, hematuria, pelvic pain and urgency.  Musculoskeletal:  Positive for arthralgias.  Skin: Negative.   Neurological: Negative.  Negative for headaches.  Psychiatric/Behavioral: Negative.        Objective:   Physical Exam Constitutional:      General: She is not in acute distress.    Appearance: Normal appearance. She is well-developed.  HENT:     Head: Normocephalic and atraumatic.     Right Ear: External ear normal.     Left Ear: External ear normal.     Nose: Nose normal.     Mouth/Throat:     Pharynx: No oropharyngeal exudate.  Eyes:     General: No scleral icterus.    Conjunctiva/sclera: Conjunctivae normal.     Pupils: Pupils are equal, round, and reactive to light.  Neck:     Thyroid: No thyromegaly.     Vascular: No JVD.  Cardiovascular:     Rate and Rhythm: Normal rate and regular rhythm.     Heart  sounds: Normal heart sounds. No murmur heard.   No friction rub. No gallop.  Pulmonary:     Effort: Pulmonary effort is normal. No respiratory distress.     Breath sounds: Normal breath sounds. No wheezing or rales.  Chest:     Chest wall: No tenderness.  Abdominal:     General: Bowel sounds are normal. There is no distension.     Palpations: Abdomen is soft. There is no mass.     Tenderness: There is no abdominal tenderness. There is no guarding or rebound.  Musculoskeletal:        General: No tenderness. Normal range of motion.     Cervical back: Normal range of motion and neck supple.  Lymphadenopathy:     Cervical: No cervical adenopathy.  Skin:    General: Skin is warm and dry.     Findings: No erythema or rash.  Neurological:     Mental Status: She is alert and oriented to person, place, and time.     Cranial Nerves: No cranial nerve deficit.     Motor: No abnormal muscle tone.     Coordination: Coordination normal.     Deep Tendon Reflexes: Reflexes are normal and symmetric. Reflexes normal.  Psychiatric:        Behavior: Behavior normal.        Thought Content:  Thought content normal.        Judgment: Judgment normal.          Assessment & Plan:  For the depression and anxiety, we will increase the Sertraline to 100 mg daily. Her HTN is stable. She will follow up with Dr. Serita Sheller in April for this, and we will have him address the aorta and coronary artery calcifications mentioned above. To follow the lung nodules, we will set up another chest CT for next month. Her GERD is stable. She will return soon fasting to check labs including lipids, etc.  We spent a total of ( 35  ) minutes reviewing records and discussing these issues.  Alysia Penna, MD

## 2021-03-22 ENCOUNTER — Other Ambulatory Visit (INDEPENDENT_AMBULATORY_CARE_PROVIDER_SITE_OTHER): Payer: Medicare Other

## 2021-03-22 DIAGNOSIS — R739 Hyperglycemia, unspecified: Secondary | ICD-10-CM

## 2021-03-22 DIAGNOSIS — I10 Essential (primary) hypertension: Secondary | ICD-10-CM | POA: Diagnosis not present

## 2021-03-22 LAB — CBC WITH DIFFERENTIAL/PLATELET
Basophils Absolute: 0.1 10*3/uL (ref 0.0–0.1)
Basophils Relative: 1 % (ref 0.0–3.0)
Eosinophils Absolute: 0.2 10*3/uL (ref 0.0–0.7)
Eosinophils Relative: 2.8 % (ref 0.0–5.0)
HCT: 43.9 % (ref 36.0–46.0)
Hemoglobin: 14.1 g/dL (ref 12.0–15.0)
Lymphocytes Relative: 26 % (ref 12.0–46.0)
Lymphs Abs: 1.9 10*3/uL (ref 0.7–4.0)
MCHC: 32 g/dL (ref 30.0–36.0)
MCV: 93.6 fl (ref 78.0–100.0)
Monocytes Absolute: 0.6 10*3/uL (ref 0.1–1.0)
Monocytes Relative: 8.4 % (ref 3.0–12.0)
Neutro Abs: 4.4 10*3/uL (ref 1.4–7.7)
Neutrophils Relative %: 61.8 % (ref 43.0–77.0)
Platelets: 315 10*3/uL (ref 150.0–400.0)
RBC: 4.7 Mil/uL (ref 3.87–5.11)
RDW: 14.2 % (ref 11.5–15.5)
WBC: 7.1 10*3/uL (ref 4.0–10.5)

## 2021-03-22 LAB — LIPID PANEL
Cholesterol: 168 mg/dL (ref 0–200)
HDL: 59.6 mg/dL (ref >39.00)
LDL Cholesterol: 72 mg/dL (ref 0–99)
NonHDL: 108.3
Total CHOL/HDL Ratio: 3
Triglycerides: 181 mg/dL — ABNORMAL HIGH (ref 0.0–149.0)
VLDL: 36.2 mg/dL (ref 0.0–40.0)

## 2021-03-22 LAB — HEPATIC FUNCTION PANEL
ALT: 10 U/L (ref 0–35)
AST: 16 U/L (ref 0–37)
Albumin: 4.1 g/dL (ref 3.5–5.2)
Alkaline Phosphatase: 62 U/L (ref 39–117)
Bilirubin, Direct: 0.1 mg/dL (ref 0.0–0.3)
Total Bilirubin: 0.5 mg/dL (ref 0.2–1.2)
Total Protein: 7.2 g/dL (ref 6.0–8.3)

## 2021-03-22 LAB — BASIC METABOLIC PANEL
BUN: 18 mg/dL (ref 6–23)
CO2: 31 mEq/L (ref 19–32)
Calcium: 9.2 mg/dL (ref 8.4–10.5)
Chloride: 102 mEq/L (ref 96–112)
Creatinine, Ser: 1.39 mg/dL — ABNORMAL HIGH (ref 0.40–1.20)
GFR: 38.01 mL/min — ABNORMAL LOW (ref >60.00)
Glucose, Bld: 114 mg/dL — ABNORMAL HIGH (ref 70–99)
Potassium: 4.1 mEq/L (ref 3.5–5.1)
Sodium: 140 mEq/L (ref 135–145)

## 2021-03-22 LAB — TSH: TSH: 1.58 u[IU]/mL (ref 0.35–5.50)

## 2021-03-22 LAB — HEMOGLOBIN A1C: Hgb A1c MFr Bld: 5.9 % (ref 4.6–6.5)

## 2021-03-24 ENCOUNTER — Other Ambulatory Visit: Payer: Medicare Other

## 2021-04-10 ENCOUNTER — Other Ambulatory Visit: Payer: Self-pay | Admitting: Family Medicine

## 2021-04-11 ENCOUNTER — Other Ambulatory Visit: Payer: Self-pay | Admitting: Cardiovascular Disease

## 2021-04-13 ENCOUNTER — Other Ambulatory Visit: Payer: Self-pay

## 2021-04-13 ENCOUNTER — Ambulatory Visit
Admission: RE | Admit: 2021-04-13 | Discharge: 2021-04-13 | Disposition: A | Discharge status: Home / Self Care | Payer: Medicare Other | Source: Ambulatory Visit | Attending: Family Medicine | Admitting: Family Medicine

## 2021-04-13 DIAGNOSIS — R911 Solitary pulmonary nodule: Secondary | ICD-10-CM | POA: Diagnosis not present

## 2021-04-13 DIAGNOSIS — R918 Other nonspecific abnormal finding of lung field: Secondary | ICD-10-CM | POA: Diagnosis not present

## 2021-04-27 DIAGNOSIS — M2550 Pain in unspecified joint: Secondary | ICD-10-CM | POA: Diagnosis not present

## 2021-04-27 DIAGNOSIS — R5382 Chronic fatigue, unspecified: Secondary | ICD-10-CM | POA: Diagnosis not present

## 2021-04-27 DIAGNOSIS — Z79899 Other long term (current) drug therapy: Secondary | ICD-10-CM | POA: Diagnosis not present

## 2021-04-27 DIAGNOSIS — M0609 Rheumatoid arthritis without rheumatoid factor, multiple sites: Secondary | ICD-10-CM | POA: Diagnosis not present

## 2021-04-29 ENCOUNTER — Other Ambulatory Visit: Payer: Self-pay | Admitting: Family Medicine

## 2021-06-07 ENCOUNTER — Other Ambulatory Visit: Payer: Self-pay | Admitting: Family Medicine

## 2021-06-07 DIAGNOSIS — K219 Gastro-esophageal reflux disease without esophagitis: Secondary | ICD-10-CM

## 2021-06-09 DIAGNOSIS — H0100B Unspecified blepharitis left eye, upper and lower eyelids: Secondary | ICD-10-CM | POA: Diagnosis not present

## 2021-06-09 DIAGNOSIS — H0100A Unspecified blepharitis right eye, upper and lower eyelids: Secondary | ICD-10-CM | POA: Diagnosis not present

## 2021-06-15 ENCOUNTER — Encounter: Payer: Self-pay | Admitting: Cardiovascular Disease

## 2021-06-15 ENCOUNTER — Ambulatory Visit: Payer: Medicare Other | Admitting: Cardiovascular Disease

## 2021-06-15 ENCOUNTER — Other Ambulatory Visit: Payer: Self-pay

## 2021-06-15 VITALS — BP 156/98 | HR 77 | Ht 60.0 in | Wt 193.0 lb

## 2021-06-15 DIAGNOSIS — I493 Ventricular premature depolarization: Secondary | ICD-10-CM | POA: Diagnosis not present

## 2021-06-15 DIAGNOSIS — I359 Nonrheumatic aortic valve disorder, unspecified: Secondary | ICD-10-CM

## 2021-06-15 DIAGNOSIS — I25118 Atherosclerotic heart disease of native coronary artery with other forms of angina pectoris: Secondary | ICD-10-CM

## 2021-06-15 DIAGNOSIS — I10 Essential (primary) hypertension: Secondary | ICD-10-CM

## 2021-06-15 MED ORDER — METOPROLOL TARTRATE 100 MG PO TABS: 100.0000 mg | ORAL_TABLET | Freq: Two times a day (BID) | ORAL | 3 refills | Status: DC

## 2021-06-15 NOTE — Patient Instructions (Signed)
Medication Instructions:  ?Increase Metoprolol to 100 mg twice a day  ? ?*If you need a refill on your cardiac medications before your next appointment, please call your pharmacy* ? ? ?Lab Work: ?None ordered  ? ?If you have labs (blood work) drawn today and your tests are completely normal, you will receive your results only by: ?MyChart Message (if you have MyChart) OR ?A paper copy in the mail ?If you have any lab test that is abnormal or we need to change your treatment, we will call you to review the results. ? ? ?Testing/Procedures: ?None ordered  ? ? ?Follow-Up: ?At [facility], you and your health needs are our priority.  As part of our continuing mission to provide you with exceptional heart care, we have created designated Provider Care Teams.  These Care Teams include your primary Cardiologist (physician) and Advanced Practice Providers (APPs -  Physician Assistants and Nurse Practitioners) who all work together to provide you with the care you need, when you need it. ? ?We recommend signing up for the patient portal called "MyChart".  Sign up information is provided on this After Visit Summary.  MyChart is used to connect with patients for Virtual Visits (Telemedicine).  Patients are able to view lab/test results, encounter notes, upcoming appointments, etc.  Non-urgent messages can be sent to your provider as well.   ?To learn more about what you can do with MyChart, go to [email or web address].   ? ?Your next appointment:   ?12 month(s) ? ?The format for your next appointment:   ?In Person ? ?Provider:   ?Lauree Chandler, MD   ? ? ?Other Instructions ?None   ?

## 2021-06-15 NOTE — Progress Notes (Signed)
? ?Chief Complaint  ?Patient presents with  ? Follow-up  ?  CAD ?  ? ?History of Present Illness: 71 yo female with history of CAD, HTN, HLD, GERD, OA, depression, ongoing tobacco abuse, COPD, palpitations/PVCs and possible bicuspid aortic valve who is here today for cardiac follow up. I saw her in May 2015 for evaluation of dyspnea, weakness. She had been on a beta blocker for years for palpitations. Echo June 2015 showed normal LV size and function, bicuspid aortic valve, mildly dilated aortic root. Due to a shadow in the aorta, a CTA chest was arranged to exclude dissection. No dissection noted. Event monitor showed sinus rhythm with PVCs, bigeminy. Inderal changed to metoprolol. Cardiac cath January 2016 showed moderate mid LAD stenosis which was evaluated with FFR (0.82-0.85 suggesting this stenosis was not flow limiting). The Circumflex and RCA had mild disease. I saw her in March 2016 and she c/o palpitations associated with dizziness and weakness, bradycardia. She was seen by Dr. Rayann Heman and was started on Flecainide. Her PVC burden did improve with this but she has not tolerated high doses of Flecainide. She has tolerated metoprolol and low dose Flecainide. PVCs have not been frequent enough to consider ablation. She was seen by EP May 2021 and doing well. No changes were made. Repeat cardiac cath in September 2017 with 50% mid LAD stenosis, mild disease in the RCA and Circumflex. She had a home sleep study in March 2018 and she was told it was normal. Echo November 2020 with LVEF=60-65%, no valve insufficiency or stenosis. Possible bicuspid aortic valve noted by prior echo.  ? ?She is here today for follow up. The patient denies any chest pain, dyspnea, palpitations, lower extremity edema, orthopnea, PND, dizziness, near syncope or syncope.  ? ?Primary Care Physician: [name], MD ? ?Past Medical History:  ?Diagnosis Date  ? Allergic rhinitis   ? ALLERGIC RHINITIS 05/08/2007  ? Qualifier: Diagnosis of   By: Sherlynn Stalls, [place], [place]    ? Allergy   ? seasonal  ? Anxiety   ? Anxiety state 05/25/2014  ? Aortic valve disease   ? a. ? possible bicuspid AV.  ? CAD (coronary artery disease)   ? a. nonobstructive by prior cath  ? Cataract   ? bilateral  ? Chronic low back pain 03/09/2016  ? Complication of anesthesia 2003  ? Medication for block "went up to my brain and I stopped breathing"  ? COPD (chronic obstructive pulmonary disease) ([place])   ? COPD (chronic obstructive pulmonary disease) with chronic bronchitis ([place]) 10/12/2014  ? Depression   ? Depression, major, single episode, moderate ([place]) 05/08/2007  ? Qualifier: Diagnosis of  By: Sherlynn Stalls, [place], [place]    ? Dexamethasone adverse reaction   ? 2002 normal  ? Dilated aortic root ([place])   ? a. mild dilated aortic root on prior echo, ascending aorta prominence on CT 2015.  ? Duodenal mass   ? GERD 05/08/2007  ? Qualifier: Diagnosis of  By: Sherlynn Stalls, [place], [place]    ? Hyperlipidemia   ? Hypertension   ? Insomnia   ? Neck pain, chronic 03/09/2016  ? NICOTINE ADDICTION 05/08/2007  ? Qualifier: Diagnosis of  By: Sarajane Jews MD, Ishmael Holter   ? Osteoarthritis   ? Overactive bladder   ? Pneumonia   ? once or twivce in past  ? Premature ventricular contractions   ? LBBB inferior axis PVCs  ? Thoracic back pain 05/30/2014  ? Tobacco abuse   ? Ulcer   ?  duodenal  ? ? ?Past Surgical History:  ?Procedure Laterality Date  ? BREAST BIOPSY    ? CARDIAC CATHETERIZATION N/A 12/03/2015  ? Procedure: Left Heart Cath and Coronary Angiography;  Surgeon: Burnell Blanks, MD;  Location: [facility];  Service: Cardiovascular;  Laterality: N/A;  ? CARPAL TUNNEL RELEASE Left   ? 1997 left,    ? CHOLECYSTECTOMY N/A 03/23/2015  ? Procedure: LAPAROSCOPIC CHOLECYSTECTOMY;  Surgeon: Ralene Ok, MD;  Location: [facility];  Service: General;  Laterality: N/A;  ? COLONOSCOPY  10/17/2013  ? per Dr. Olevia Perches, benign polyps, repeat in 10 years  ? EUS N/A 11/12/2014  ? Procedure: UPPER ENDOSCOPIC ULTRASOUND (EUS) LINEAR;   Surgeon: Milus Banister, MD;  Location: [facility];  Service: Endoscopy;  Laterality: N/A;  ? EUS N/A 06/08/2016  ? Procedure: UPPER ENDOSCOPIC ULTRASOUND (EUS) RADIAL;  Surgeon: Milus Banister, MD;  Location: [facility];  Service: Endoscopy;  Laterality: N/A;  ? EYE SURGERY    ? LEFT AND RIGHT HEART CATHETERIZATION WITH CORONARY ANGIOGRAM N/A 04/22/2014  ? Procedure: LEFT AND RIGHT HEART CATHETERIZATION WITH CORONARY ANGIOGRAM;  Surgeon: Burnell Blanks, MD;  Location: [facility];  Service: Cardiovascular;  Laterality: N/A;  ? TONSILLECTOMY  1950  ? torn cartilage repaired rt wrist Right 2003  ? ? ?Current Outpatient Medications  ?Medication Sig Dispense Refill  ? albuterol (PROAIR HFA) 108 (90 Base) MCG/ACT inhaler Inhale 2 puffs into the lungs every 6 (six) hours as needed for wheezing. 8.5 each 11  ? ALPRAZolam (XANAX) 0.25 MG tablet TAKE 3 TABLETS BY MOUTH EVERY 6 HOURS AS NEEDED 360 tablet 1  ? aspirin EC 81 MG tablet Take 1 tablet (81 mg total) by mouth daily. 90 tablet 3  ? atorvastatin (LIPITOR) 20 MG tablet TAKE 1 TABLET BY MOUTH EVERY DAY 90 tablet 3  ? buPROPion (WELLBUTRIN XL) 300 MG 24 hr tablet Take 1 tablet (300 mg total) by mouth daily. 90 tablet 3  ? cetirizine (ZYRTEC) 10 MG tablet Take 10 mg by mouth daily as needed for allergies.     ? cholecalciferol (VITAMIN D3) 25 MCG (1000 UNIT) tablet Take 1,000 Units by mouth daily.    ? doxycycline (VIBRAMYCIN) 100 MG capsule Take 100 mg by mouth 2 (two) times daily.    ? flecainide (TAMBOCOR) 50 MG tablet Take 1 tablet (50 mg total) by mouth 3 (three) times daily. Please keep upcoming appt in March 2023 with Dr. Angelena Form before anymore refills. Thank you Final Attempt 270 tablet 0  ? leflunomide (ARAVA) 20 MG tablet Take 1 tablet (20 mg total) by mouth daily. 30 tablet 0  ? lisinopril (ZESTRIL) 10 MG tablet Take 10 mg by mouth daily.    ? Magnesium 400 MG CAPS Take 400 mg by mouth daily. 90 capsule 3  ? metoprolol tartrate (LOPRESSOR) 100  MG tablet Take 1 tablet (100 mg total) by mouth 2 (two) times daily. 180 tablet 3  ? nitroGLYCERIN (NITROSTAT) 0.4 MG SL tablet Place 1 tablet (0.4 mg total) under the tongue every 5 (five) minutes as needed for chest pain. 25 tablet 3  ? pantoprazole (PROTONIX) 40 MG tablet TAKE 1 TABLET BY MOUTH TWICE A DAY 180 tablet 0  ? sertraline (ZOLOFT) 100 MG tablet Take 1 tablet (100 mg total) by mouth daily. 90 tablet 3  ? ?No current facility-administered medications for this visit.  ? ? ?Allergies  ?Allergen Reactions  ? Augmentin [Amoxicillin-Pot Clavulanate] Nausea And Vomiting  ? Clindamycin/Lincomycin  Nausea And Vomiting  ? Colchicine Diarrhea  ? ? ?Social History  ? ?Socioeconomic History  ? Marital status: Single  ?  Spouse name: Not on file  ? Number of children: 0  ? Years of education: Not on file  ? Highest education level: Not on file  ?Occupational History  ? Occupation: CNA  ?Tobacco Use  ? Smoking status: Former  ?  Packs/day: 0.45  ?  Years: 54.00  ?  Pack years: 24.30  ?  Types: Cigarettes  ?  Quit date: 01/01/2017  ?  Years since quitting: 4.4  ? Smokeless tobacco: Never  ? Tobacco comments:  ?  1/2 pack per day or less  ?Vaping Use  ? Vaping Use: Never used  ?Substance and Sexual Activity  ? Alcohol use: No  ?  Alcohol/week: 0.0 standard drinks  ? Drug use: No  ? Sexual activity: Not on file  ?Other Topics Concern  ? Not on file  ?Social History Narrative  ? Works as Quarry manager at [employer].  ? ?Social Determinants of Health  ? ?Financial Resource Strain: Low Risk   ? Difficulty of Paying Living Expenses: Not hard at all  ?Food Insecurity: No Food Insecurity  ? Worried About Charity fundraiser in the Last Year: Never true  ? Ran Out of Food in the Last Year: Never true  ?Transportation Needs: No Transportation Needs  ? Lack of Transportation (Medical): No  ? Lack of Transportation (Non-Medical): No  ?Physical Activity: Insufficiently Active  ? Days of Exercise per Week: 5 days  ? Minutes of Exercise per  Session: 10 min  ?Stress: No Stress Concern Present  ? Feeling of Stress : Not at all  ?Social Connections: Socially Isolated  ? Frequency of Communication with Friends and Family: Once a week  ? Frequency

## 2021-06-30 DIAGNOSIS — H0100A Unspecified blepharitis right eye, upper and lower eyelids: Secondary | ICD-10-CM | POA: Diagnosis not present

## 2021-06-30 DIAGNOSIS — H0100B Unspecified blepharitis left eye, upper and lower eyelids: Secondary | ICD-10-CM | POA: Diagnosis not present

## 2021-07-08 ENCOUNTER — Other Ambulatory Visit: Payer: Self-pay | Admitting: Cardiovascular Disease

## 2021-07-08 MED ORDER — MAGNESIUM 400 MG PO CAPS: 400.0000 mg | ORAL_CAPSULE | Freq: Every day | ORAL | 3 refills | Status: DC

## 2021-07-22 DIAGNOSIS — Z79899 Other long term (current) drug therapy: Secondary | ICD-10-CM | POA: Diagnosis not present

## 2021-07-22 DIAGNOSIS — N1832 Chronic kidney disease, stage 3b: Secondary | ICD-10-CM | POA: Diagnosis not present

## 2021-07-22 DIAGNOSIS — R5382 Chronic fatigue, unspecified: Secondary | ICD-10-CM | POA: Diagnosis not present

## 2021-07-22 DIAGNOSIS — M2550 Pain in unspecified joint: Secondary | ICD-10-CM | POA: Diagnosis not present

## 2021-07-24 ENCOUNTER — Other Ambulatory Visit: Payer: Self-pay | Admitting: Family Medicine

## 2021-07-25 DIAGNOSIS — H0100B Unspecified blepharitis left eye, upper and lower eyelids: Secondary | ICD-10-CM | POA: Diagnosis not present

## 2021-07-25 DIAGNOSIS — H0100A Unspecified blepharitis right eye, upper and lower eyelids: Secondary | ICD-10-CM | POA: Diagnosis not present

## 2021-07-25 DIAGNOSIS — H02889 Meibomian gland dysfunction of unspecified eye, unspecified eyelid: Secondary | ICD-10-CM | POA: Diagnosis not present

## 2021-07-27 DIAGNOSIS — I5032 Chronic diastolic (congestive) heart failure: Secondary | ICD-10-CM | POA: Diagnosis not present

## 2021-07-27 DIAGNOSIS — N1832 Chronic kidney disease, stage 3b: Secondary | ICD-10-CM | POA: Diagnosis not present

## 2021-07-27 DIAGNOSIS — K219 Gastro-esophageal reflux disease without esophagitis: Secondary | ICD-10-CM | POA: Diagnosis not present

## 2021-07-27 DIAGNOSIS — I2510 Atherosclerotic heart disease of native coronary artery without angina pectoris: Secondary | ICD-10-CM | POA: Diagnosis not present

## 2021-07-27 DIAGNOSIS — R918 Other nonspecific abnormal finding of lung field: Secondary | ICD-10-CM | POA: Diagnosis not present

## 2021-07-27 DIAGNOSIS — I129 Hypertensive chronic kidney disease with stage 1 through stage 4 chronic kidney disease, or unspecified chronic kidney disease: Secondary | ICD-10-CM | POA: Diagnosis not present

## 2021-07-27 DIAGNOSIS — E279 Disorder of adrenal gland, unspecified: Secondary | ICD-10-CM | POA: Diagnosis not present

## 2021-07-27 DIAGNOSIS — M159 Polyosteoarthritis, unspecified: Secondary | ICD-10-CM | POA: Diagnosis not present

## 2021-07-27 DIAGNOSIS — E559 Vitamin D deficiency, unspecified: Secondary | ICD-10-CM | POA: Diagnosis not present

## 2021-10-25 DIAGNOSIS — M2550 Pain in unspecified joint: Secondary | ICD-10-CM | POA: Diagnosis not present

## 2021-10-25 DIAGNOSIS — R5382 Chronic fatigue, unspecified: Secondary | ICD-10-CM | POA: Diagnosis not present

## 2021-10-25 DIAGNOSIS — Z79899 Other long term (current) drug therapy: Secondary | ICD-10-CM | POA: Diagnosis not present

## 2021-10-25 DIAGNOSIS — M0609 Rheumatoid arthritis without rheumatoid factor, multiple sites: Secondary | ICD-10-CM | POA: Diagnosis not present

## 2021-12-21 ENCOUNTER — Other Ambulatory Visit: Payer: Self-pay | Admitting: Family Medicine

## 2021-12-22 NOTE — Telephone Encounter (Signed)
Last refill-05/04/21--360 tabs, 1 refill.    Last OV-03/16/21  No future OV scheduled.

## 2022-01-10 ENCOUNTER — Telehealth: Payer: Self-pay

## 2022-01-10 NOTE — Telephone Encounter (Signed)
--  Caller stated that her right calf/shin has an oval shaped area that was swelling. This started about a week ago. It also now has some bruising. Starting about two days ago, her calf began to swell more and become larger. It did feel hard. Denies other symptoms.   01/09/2022 4:26:25 PM See HCP within 4 Hours (or PCP triage) Alford Highland, RN, Magda Paganini  Referrals GO TO FACILITY REFUSED  01/10/22 1402 - LVM instructions for pt to call back.

## 2022-01-16 ENCOUNTER — Ambulatory Visit: Payer: Medicare Other | Admitting: Family Medicine

## 2022-01-19 ENCOUNTER — Other Ambulatory Visit: Payer: Self-pay | Admitting: Family Medicine

## 2022-01-19 DIAGNOSIS — K219 Gastro-esophageal reflux disease without esophagitis: Secondary | ICD-10-CM

## 2022-01-20 ENCOUNTER — Encounter: Payer: Self-pay | Admitting: Family Medicine

## 2022-01-20 ENCOUNTER — Ambulatory Visit (INDEPENDENT_AMBULATORY_CARE_PROVIDER_SITE_OTHER): Payer: Medicare Other | Admitting: Family Medicine

## 2022-01-20 VITALS — BP 128/80 | HR 58 | Temp 98.0°F | Wt 187.0 lb

## 2022-01-20 DIAGNOSIS — T7840XA Allergy, unspecified, initial encounter: Secondary | ICD-10-CM

## 2022-01-20 DIAGNOSIS — M7989 Other specified soft tissue disorders: Secondary | ICD-10-CM

## 2022-01-20 DIAGNOSIS — M069 Rheumatoid arthritis, unspecified: Secondary | ICD-10-CM | POA: Diagnosis not present

## 2022-01-20 DIAGNOSIS — M79661 Pain in right lower leg: Secondary | ICD-10-CM

## 2022-01-20 NOTE — Progress Notes (Signed)
   Subjective:    Patient ID: [name] [name], female    DOB: 12/17/1950, 71 y.o.   MRN: [id number]  HPI Here for several issues. First she wants [place] to check an itchy rash that appeared over her neck and trunk about 3 weeks ago. She notes that her rheumatologist, [name] Kindle NP, had changed her from Flecanide to Hydroxychloroquine about 5 weeks ago. This was to treat her RA. [name] felt the rash may be an allergic reaction to the Hydroxychloroquine so she stopped taking this one week ago. In fact the rash immediately began to fade away, and there are only a few remnants left. Also she describes an area on the right lower leg that suddenly became swollen and tender about 2 weeks ago. This never felt warm. No recent trauma. After several days the swelling began to go down and it had a bruised appearance. It was bluish and then greenish in color. Now it is faintly yellow.    Review of Systems  Constitutional: Negative.   Respiratory: Negative.    Cardiovascular:  Positive for leg swelling. Negative for chest pain and palpitations.  Skin:  Positive for rash.       Objective:   Physical Exam Constitutional:      General: She is not in acute distress.    Appearance: Normal appearance.  Cardiovascular:     Rate and Rhythm: Normal rate and regular rhythm.     Pulses: Normal pulses.     Heart sounds: Normal heart sounds.  Pulmonary:     Effort: Pulmonary effort is normal.     Breath sounds: Normal breath sounds.  Skin:    Comments: There are patches or faint macular erythema scattered over the upper chest and neck. There is also an area on the right lower leg which was faintly yellow in color and was slightly raised. No warmth or tenderness. No cords are felt   Neurological:     Mental Status: She is alert.           Assessment & Plan:  First I agree that the chest rash is most likely an allergic reaction to the Hydrochloroquine, so she will stay off this. The rash is fading away now  as expected. She will contact her rheumatologist to let her know what has happened. The etiology of the lower leg swelling is unclear, but one possibility would be a superficial phlebitis. At any rate this appears to be resolving. Recheck as needed.  Alysia Penna, MD

## 2022-01-25 DIAGNOSIS — M0609 Rheumatoid arthritis without rheumatoid factor, multiple sites: Secondary | ICD-10-CM | POA: Diagnosis not present

## 2022-03-06 ENCOUNTER — Ambulatory Visit (INDEPENDENT_AMBULATORY_CARE_PROVIDER_SITE_OTHER): Payer: Medicare Other

## 2022-03-06 VITALS — Ht 61.5 in | Wt 187.0 lb

## 2022-03-06 DIAGNOSIS — Z0000 Encounter for general adult medical examination without abnormal findings: Secondary | ICD-10-CM

## 2022-03-06 NOTE — Patient Instructions (Addendum)
[name] [name] , Thank you for taking time to come for your Medicare Wellness Visit. I appreciate your ongoing commitment to your health goals. Please review the following plan we discussed and let me know if I can assist you in the future.   These are the goals we discussed:  Goals       Exercise 3x per week (30 min per time)      No current goals (pt-stated)        This is a list of the screening recommended for you and due dates:  Health Maintenance  Topic Date Due   COVID-19 Vaccine (1) 03/22/2022*   Zoster (Shingles) Vaccine (1 of 2) 06/05/2022*   Flu Shot  07/02/2022*   Pneumonia Vaccine (2 - PPSV23 or PCV20) 03/07/2023*   Mammogram  03/07/2023*   DEXA scan (bone density measurement)  03/07/2023*   Hepatitis C Screening: USPSTF Recommendation to screen - Ages 18-79 yo.  03/07/2023*   Screening for Lung Cancer  04/13/2022   Medicare Annual Wellness Visit  03/07/2023   Colon Cancer Screening  10/18/2023   DTaP/Tdap/Td vaccine (2 - Td or Tdap) 12/11/2027   HPV Vaccine  Aged Out  *Topic was postponed. The date shown is not the original due date.    Advanced directives: Advance directive discussed with you today. Even though you declined this today, please call our office should you change your mind, and we can give you the proper paperwork for you to fill out.   Conditions/risks identified: None  Next appointment: Follow up in one year for your annual wellness visit    Preventive Care 65 Years and Older, Female Preventive care refers to lifestyle choices and visits with your health care provider that can promote health and wellness. What does preventive care include? A yearly physical exam. This is also called an annual well check. Dental exams once or twice a year. Routine eye exams. Ask your health care provider how often you should have your eyes checked. Personal lifestyle choices, including: Daily care of your teeth and gums. Regular physical activity. Eating a healthy  diet. Avoiding tobacco and drug use. Limiting alcohol use. Practicing safe sex. Taking low-dose aspirin every day. Taking vitamin and mineral supplements as recommended by your health care provider. What happens during an annual well check? The services and screenings done by your health care provider during your annual well check will depend on your age, overall health, lifestyle risk factors, and family history of disease. Counseling  Your health care provider may ask you questions about your: Alcohol use. Tobacco use. Drug use. Emotional well-being. Home and relationship well-being. Sexual activity. Eating habits. History of falls. Memory and ability to understand (cognition). Work and work Statistician. Reproductive health. Screening  You may have the following tests or measurements: Height, weight, and BMI. Blood pressure. Lipid and cholesterol levels. These may be checked every 5 years, or more frequently if you are over 68 years old. Skin check. Lung cancer screening. You may have this screening every year starting at age 29 if you have a 30-pack-year history of smoking and currently smoke or have quit within the past 15 years. Fecal occult blood test (FOBT) of the stool. You may have this test every year starting at age 59. Flexible sigmoidoscopy or colonoscopy. You may have a sigmoidoscopy every 5 years or a colonoscopy every 10 years starting at age 76. Hepatitis C blood test. Hepatitis B blood test. Sexually transmitted disease (STD) testing. Diabetes screening. This is done  by checking your blood sugar (glucose) after you have not eaten for a while (fasting). You may have this done every 1-3 years. Bone density scan. This is done to screen for osteoporosis. You may have this done starting at age 27. Mammogram. This may be done every 1-2 years. Talk to your health care provider about how often you should have regular mammograms. Talk with your health care provider about  your test results, treatment options, and if necessary, the need for more tests. Vaccines  Your health care provider may recommend certain vaccines, such as: Influenza vaccine. This is recommended every year. Tetanus, diphtheria, and acellular pertussis (Tdap, Td) vaccine. You may need a Td booster every 10 years. Zoster vaccine. You may need this after age 63. Pneumococcal 13-valent conjugate (PCV13) vaccine. One dose is recommended after age 47. Pneumococcal polysaccharide (PPSV23) vaccine. One dose is recommended after age 99. Talk to your health care provider about which screenings and vaccines you need and how often you need them. This information is not intended to replace advice given to you by your health care provider. Make sure you discuss any questions you have with your health care provider. Document Released: 04/16/2015 Document Revised: 12/08/2015 Document Reviewed: 01/19/2015 Elsevier Interactive Patient Education  2017 [place] Prevention in the Home Falls can cause injuries. They can happen to people of all ages. There are many things you can do to make your home safe and to help prevent falls. What can I do on the outside of my home? Regularly fix the edges of walkways and driveways and fix any cracks. Remove anything that might make you trip as you walk through a door, such as a raised step or threshold. Trim any bushes or trees on the path to your home. Use bright outdoor lighting. Clear any walking paths of anything that might make someone trip, such as rocks or tools. Regularly check to see if handrails are loose or broken. Make sure that both sides of any steps have handrails. Any raised decks and porches should have guardrails on the edges. Have any leaves, snow, or ice cleared regularly. Use sand or salt on walking paths during winter. Clean up any spills in your garage right away. This includes oil or grease spills. What can I do in the bathroom? Use  night lights. Install grab bars by the toilet and in the tub and shower. Do not use towel bars as grab bars. Use non-skid mats or decals in the tub or shower. If you need to sit down in the shower, use a plastic, non-slip stool. Keep the floor dry. Clean up any water that spills on the floor as soon as it happens. Remove soap buildup in the tub or shower regularly. Attach bath mats securely with double-sided non-slip rug tape. Do not have throw rugs and other things on the floor that can make you trip. What can I do in the bedroom? Use night lights. Make sure that you have a light by your bed that is easy to reach. Do not use any sheets or blankets that are too big for your bed. They should not hang down onto the floor. Have a firm chair that has side arms. You can use this for support while you get dressed. Do not have throw rugs and other things on the floor that can make you trip. What can I do in the kitchen? Clean up any spills right away. Avoid walking on wet floors. Keep items that you use  a lot in easy-to-reach places. If you need to reach something above you, use a strong step stool that has a grab bar. Keep electrical cords out of the way. Do not use floor polish or wax that makes floors slippery. If you must use wax, use non-skid floor wax. Do not have throw rugs and other things on the floor that can make you trip. What can I do with my stairs? Do not leave any items on the stairs. Make sure that there are handrails on both sides of the stairs and use them. Fix handrails that are broken or loose. Make sure that handrails are as long as the stairways. Check any carpeting to make sure that it is firmly attached to the stairs. Fix any carpet that is loose or worn. Avoid having throw rugs at the top or bottom of the stairs. If you do have throw rugs, attach them to the floor with carpet tape. Make sure that you have a light switch at the top of the stairs and the bottom of the  stairs. If you do not have them, ask someone to add them for you. What else can I do to help prevent falls? Wear shoes that: Do not have high heels. Have rubber bottoms. Are comfortable and fit you well. Are closed at the toe. Do not wear sandals. If you use a stepladder: Make sure that it is fully opened. Do not climb a closed stepladder. Make sure that both sides of the stepladder are locked into place. Ask someone to hold it for you, if possible. Clearly mark and make sure that you can see: Any grab bars or handrails. First and last steps. Where the edge of each step is. Use tools that help you move around (mobility aids) if they are needed. These include: Canes. Walkers. Scooters. Crutches. Turn on the lights when you go into a dark area. Replace any light bulbs as soon as they burn out. Set up your furniture so you have a clear path. Avoid moving your furniture around. If any of your floors are uneven, fix them. If there are any pets around you, be aware of where they are. Review your medicines with your doctor. Some medicines can make you feel dizzy. This can increase your chance of falling. Ask your doctor what other things that you can do to help prevent falls. This information is not intended to replace advice given to you by your health care provider. Make sure you discuss any questions you have with your health care provider. Document Released: 01/14/2009 Document Revised: 08/26/2015 Document Reviewed: 04/24/2014 Elsevier Interactive Patient Education  2017 [employer].

## 2022-03-06 NOTE — Progress Notes (Signed)
Subjective:   [name] [name] is a 71 y.o. female who presents for Medicare Annual (Subsequent) preventive examination.  Review of Systems    Virtual Visit via Telephone Note  I connected with  [name] [name] on 03/06/22 at  2:45 PM EST by telephone and verified that I am speaking with the correct person using two identifiers.  Location: Patient: Home Provider: Office Persons participating in the virtual visit: patient/Nurse Health Advisor   I discussed the limitations, risks, security and privacy concerns of performing an evaluation and management service by telephone and the availability of in person appointments. The patient expressed understanding and agreed to proceed.  Interactive audio and video telecommunications were attempted between this nurse and patient, however failed, due to patient having technical difficulties OR patient did not have access to video capability.  We continued and completed visit with audio only.  Some vital signs may be absent or patient reported.   [name] Peaches, LPN  Cardiac Risk Factors include: advanced age (>66mn, >>59women);hypertension     Objective:    Today's Vitals   03/06/22 1451  Weight: 187 lb (84.8 kg)  Height: 5' 1.5" (1.562 m)   Body mass index is 34.76 kg/m.     03/06/2022    3:04 PM 03/03/2021    3:01 PM 03/02/2020    2:58 PM 06/08/2016    7:47 AM 06/07/2016    9:18 AM 12/03/2015   10:44 AM 11/12/2014    6:32 AM  Advanced Directives  Does Patient Have a Medical Advance Directive? No No No No No No No  Does patient want to make changes to medical advance directive?   No - Patient declined      Would patient like information on creating a medical advance directive? No - Patient declined No - Patient declined  No - Patient declined No - Patient declined  No - patient declined information    Current Medications (verified) Outpatient Encounter Medications as of 03/06/2022  Medication Sig   albuterol (PROAIR HFA) 108  (90 Base) MCG/ACT inhaler Inhale 2 puffs into the lungs every 6 (six) hours as needed for wheezing.   ALPRAZolam (XANAX) 0.25 MG tablet TAKE 3 TABLETS BY MOUTH EVERY 6 HOURS AS NEEDED   aspirin EC 81 MG tablet Take 1 tablet (81 mg total) by mouth daily.   atorvastatin (LIPITOR) 20 MG tablet TAKE 1 TABLET BY MOUTH EVERY DAY   buPROPion (WELLBUTRIN XL) 300 MG 24 hr tablet Take 1 tablet (300 mg total) by mouth daily.   cetirizine (ZYRTEC) 10 MG tablet Take 10 mg by mouth daily as needed for allergies.    cholecalciferol (VITAMIN D3) 25 MCG (1000 UNIT) tablet Take 1,000 Units by mouth daily.   lisinopril (ZESTRIL) 10 MG tablet Take 10 mg by mouth daily.   Magnesium 400 MG CAPS Take 400 mg by mouth daily.   metoprolol tartrate (LOPRESSOR) 100 MG tablet Take 1 tablet (100 mg total) by mouth 2 (two) times daily.   nitroGLYCERIN (NITROSTAT) 0.4 MG SL tablet Place 1 tablet (0.4 mg total) under the tongue every 5 (five) minutes as needed for chest pain.   pantoprazole (PROTONIX) 40 MG tablet TAKE 1 TABLET BY MOUTH TWICE A DAY   sertraline (ZOLOFT) 100 MG tablet Take 1 tablet (100 mg total) by mouth daily.   No facility-administered encounter medications on file as of 03/06/2022.    Allergies (verified) Augmentin [amoxicillin-pot clavulanate], Clindamycin/lincomycin, Colchicine, Flecainide, and Hydroxychloroquine   History: Past Medical  History:  Diagnosis Date   Allergic rhinitis    ALLERGIC RHINITIS 05/08/2007   Qualifier: Diagnosis of  By: Sherlynn Stalls, CMA, Cindy     Allergy    seasonal   Anxiety    Anxiety state 05/25/2014   Aortic valve disease    a. ? possible bicuspid AV.   CAD (coronary artery disease)    a. 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 mild dilated aortic root on prior echo, ascending aorta prominence on CT 2015.   Duodenal mass    GERD 05/08/2007   Qualifier: Diagnosis of  By: Sherlynn Stalls, CMA, Cindy     Hyperlipidemia    Hypertension    Insomnia    Neck pain, chronic 03/09/2016   NICOTINE ADDICTION 05/08/2007   Qualifier: Diagnosis of  By: Sarajane Jews MD, Annie Main A    Osteoarthritis    Overactive bladder    Pneumonia    once or twivce in past   Premature ventricular contractions    LBBB inferior axis PVCs   Thoracic back pain 05/30/2014   Tobacco abuse    Ulcer    duodenal   Past Surgical History:  Procedure Laterality Date   BREAST BIOPSY     CARDIAC CATHETERIZATION N/A 12/03/2015   Procedure: Left Heart Cath and Coronary Angiography;  Surgeon: Burnell Blanks, MD;  Location: [facility];  Service: Cardiovascular;  Laterality: N/A;   CARPAL TUNNEL RELEASE Left    1997 left,     CHOLECYSTECTOMY N/A 03/23/2015   Procedure: LAPAROSCOPIC CHOLECYSTECTOMY;  Surgeon: Ralene Ok, MD;  Location: [facility];  Service: General;  Laterality: N/A;   COLONOSCOPY  10/17/2013   per Dr. Olevia Perches, benign polyps, repeat in 10 years   EUS N/A 11/12/2014   Procedure: UPPER ENDOSCOPIC ULTRASOUND (EUS) LINEAR;  Surgeon: Milus Banister, MD;  Location: [facility];  Service: Endoscopy;  Laterality: N/A;   EUS N/A 06/08/2016   Procedure: UPPER ENDOSCOPIC ULTRASOUND (EUS) RADIAL;  Surgeon: Milus Banister, MD;  Location: [facility];  Service: Endoscopy;  Laterality: N/A;   EYE SURGERY     LEFT AND RIGHT HEART CATHETERIZATION WITH CORONARY ANGIOGRAM N/A 04/22/2014   Procedure: LEFT AND RIGHT HEART CATHETERIZATION WITH CORONARY ANGIOGRAM;  Surgeon: Burnell Blanks, MD;   Location: [facility];  Service: Cardiovascular;  Laterality: N/A;   TONSILLECTOMY  1950   torn cartilage repaired rt wrist Right 2003   Family History  Problem Relation Age of Onset   CAD Brother    Kidney disease Brother    Emphysema Mother    Other Father        brain tumor   Throat cancer Brother    Hyperlipidemia Other    Hypertension Other    Kidney disease Other    Heart disease Other    Colon cancer Neg Hx    Esophageal cancer Neg Hx    Stomach cancer Neg Hx    Rectal  cancer Neg Hx    Social History   Socioeconomic History   Marital status: Single    Spouse name: Not on file   Number of children: 0   Years of education: Not on file   Highest education level: Not on file  Occupational History   Occupation: CNA  Tobacco Use   Smoking status: Former    Packs/day: 0.45    Years: 54.00    Total pack years: 24.30    Types: Cigarettes    Quit date: 01/01/2017    Years since quitting: 5.1   Smokeless tobacco: Never   Tobacco comments:    1/2 pack per day or less  Vaping Use   Vaping Use: Never used  Substance and Sexual Activity   Alcohol use: No    Alcohol/week: 0.0 standard drinks of alcohol   Drug use: No   Sexual activity: Not on file  Other Topics Concern   Not on file  Social History Narrative   Works as Quarry manager at [employer].   Social Determinants of Health   Financial Resource Strain: Low Risk  (03/06/2022)   Overall Financial Resource Strain (CARDIA)    Difficulty of Paying Living Expenses: Not hard at all  Food Insecurity: No Food Insecurity (03/06/2022)   Hunger Vital Sign    Worried About Running Out of Food in the Last Year: Never true    Ran Out of Food in the Last Year: Never true  Transportation Needs: No Transportation Needs (03/06/2022)   PRAPARE - Hydrologist (Medical): No    Lack of Transportation (Non-Medical): No  Physical Activity: Inactive (03/06/2022)   Exercise Vital Sign    Days of Exercise per  Week: 0 days    Minutes of Exercise per Session: 0 min  Stress: No Stress Concern Present (03/06/2022)   [place]    Feeling of Stress : Not at all  Social Connections: Socially Isolated (03/06/2022)   Social Connection and Isolation Panel [NHANES]    Frequency of Communication with Friends and Family: More than three times a week    Frequency of Social Gatherings with Friends and Family: More than three times a week    Attends Religious Services: Never    Marine scientist or Organizations: No    Attends Music therapist: Never    Marital Status: Never married    Tobacco Counseling Counseling given: Not Answered Tobacco comments: 1/2 pack per day or less   Clinical Intake:  Pre-visit preparation completed: No  Pain : No/denies pain     BMI - recorded: 34.76 Nutritional Status: BMI > 30  Obese Nutritional Risks: None Diabetes: No  How often do you need to have someone help you when you read instructions, pamphlets, or other written materials from your doctor or pharmacy?: 1 - Never  Diabetic?  No  Interpreter Needed?: No  Information entered by :: Rolene Arbour LPN   Activities of Daily Living    03/06/2022    3:01 PM 03/16/2021    5:28 PM  In your present state of health, do you have any difficulty performing the following activities:  Hearing? 0 0  Vision? 0 0  Difficulty concentrating or making decisions? 0 0  Walking or climbing stairs? 0 0  Dressing or bathing? 0 0  Doing errands, shopping? 0 0  Preparing Food and eating ? N   Using the Toilet? N   In  the past six months, have you accidently leaked urine? N   Do you have problems with loss of bowel control? N   Managing your Medications? N   Managing your Finances? N   Housekeeping or managing your Housekeeping? N     Patient Care Team: [name], MD as PCP - General Angelena Form Annita Brod, MD as PCP -  Cardiology (Cardiology) Thompson Grayer, MD as PCP - Electrophysiology (Cardiology) Viona Gilmore, [facility] as Pharmacist (Pharmacist)  Indicate any recent Medical Services you may have received from other than [facility] providers in the past year (date may be approximate).     Assessment:   This is a routine wellness examination for [place].  Hearing/Vision screen Hearing Screening - Comments:: Denies hearing difficulties   Vision Screening - Comments:: Wears rx glasses - up to date with routine eye exams with  Dr Sherral Hammers  Dietary issues and exercise activities discussed: Current Exercise Habits: The patient does not participate in regular exercise at present, Exercise limited by: None identified   Goals Addressed               This Visit's Progress     No current goals (pt-stated)         Depression Screen    03/06/2022    2:58 PM 01/20/2022   11:56 AM 03/16/2021    5:27 PM 03/03/2021    2:54 PM 03/03/2021    2:53 PM 03/03/2021    2:46 PM 02/15/2021    2:56 PM  PHQ 2/9 Scores  PHQ - 2 Score 0 0 4 2 0 0 6  PHQ- 9 Score 0 0 13 5 0 0 19    Fall Risk    03/06/2022    3:02 PM 01/20/2022   11:56 AM 03/16/2021    5:27 PM 03/03/2021    2:52 PM 03/03/2021    1:26 PM  Fall Risk   Falls in the past year? 1 0 1 0 0  Number falls in past yr: 0 0 0 0   Injury with Fall? 0 0 0 0   Comment Rt knee sore, No medical attentio needed      Risk for fall due to : No Fall Risks No Fall Risks No Fall Risks    Follow up Falls prevention discussed Falls evaluation completed       FALL RISK PREVENTION PERTAINING TO THE HOME:  Any stairs in or around the home? Yes  If so, are there any without handrails? No  Home free of loose throw rugs in walkways, pet beds, electrical cords, etc? Yes  Adequate lighting in your home to reduce risk of falls? Yes   ASSISTIVE DEVICES UTILIZED TO PREVENT FALLS:  Life alert? No  Use of a cane, walker or w/c? No  Grab bars in the bathroom? No  Shower chair or  bench in shower? No  Elevated toilet seat or a handicapped toilet? No   TIMED UP AND GO:  Was the test performed? No . Audio Visit  Cognitive Function:      07/20/2016    1:28 PM  Montreal Cognitive Assessment   Visuospatial/ Executive (0/5) 4  Naming (0/3) 3  Attention: Read list of digits (0/2) 2  Attention: Read list of letters (0/1) 1  Attention: Serial 7 subtraction starting at 100 (0/3) 3  Language: Repeat phrase (0/2) 2  Language : Fluency (0/1) 0  Abstraction (0/2) 2  Delayed Recall (0/5) 0  Orientation (0/6) 6  Total 23  Adjusted Score (based on education) 24      03/06/2022    3:04 PM 03/03/2021    2:59 PM  6CIT Screen  What Year? 0 points 0 points  What month? 0 points 0 points  What time? 0 points 0 points  Count back from 20 0 points 0 points  Months in reverse 4 points 0 points  Repeat phrase 0 points 0 points  Total Score 4 points 0 points    Immunizations Immunization History  Administered Date(s) Administered   Influenza, High Dose Seasonal PF 02/22/2015, 02/02/2016, 01/23/2017, 01/07/2018   Influenza-Unspecified 02/01/2014   Pneumococcal Conjugate-13 01/07/2018   Tdap 12/10/2017    TDAP status: Up to date  Flu Vaccine status: Declined, Education has been provided regarding the importance of this vaccine but patient still declined. Advised may receive this vaccine at local pharmacy or Health Dept. Aware to provide a copy of the vaccination record if obtained from local pharmacy or Health Dept. Verbalized acceptance and understanding.  Pneumococcal vaccine status: Declined,  Education has been provided regarding the importance of this vaccine but patient still declined. Advised may receive this vaccine at local pharmacy or Health Dept. Aware to provide a copy of the vaccination record if obtained from local pharmacy or Health Dept. Verbalized acceptance and understanding.   Covid-19 vaccine status: Declined, Education has been provided regarding the  importance of this vaccine but patient still declined. Advised may receive this vaccine at local pharmacy or Health Dept.or vaccine clinic. Aware to provide a copy of the vaccination record if obtained from local pharmacy or Health Dept. Verbalized acceptance and understanding.  Qualifies for Shingles Vaccine? Yes   Zostavax completed No   Shingrix Completed?: No.    Education has been provided regarding the importance of this vaccine. Patient has been advised to call insurance company to determine out of pocket expense if they have not yet received this vaccine. Advised may also receive vaccine at local pharmacy or Health Dept. Verbalized acceptance and understanding.  Screening Tests Health Maintenance  Topic Date Due   COVID-19 Vaccine (1) 03/22/2022 (Originally 02/05/1956)   Zoster Vaccines- Shingrix (1 of 2) 06/05/2022 (Originally 02/06/1968)   INFLUENZA VACCINE  07/02/2022 (Originally 11/01/2021)   Pneumonia Vaccine 49+ Years old (2 - PPSV23 or PCV20) 03/07/2023 (Originally 03/04/2018)   MAMMOGRAM  03/07/2023 (Originally 02/15/2020)   DEXA SCAN  03/07/2023 (Originally 02/05/2014)   Hepatitis C Screening  03/07/2023 (Originally 02/06/1967)   Lung Cancer Screening  04/13/2022   Medicare Annual Wellness (AWV)  03/07/2023   COLONOSCOPY (Pts 45-67yr Insurance coverage will need to be confirmed)  10/18/2023   DTaP/Tdap/Td (2 - Td or Tdap) 12/11/2027   HPV VACCINES  Aged Out    Health Maintenance  There are no preventive care reminders to display for this patient.   Colorectal cancer screening: Type of screening: Colonoscopy. Completed 10/17/13. Repeat every 10 years  Mammogram status: Ordered Patient deferred. Pt provided with contact info and advised to call to schedule appt.   Bone Density status: Ordered Patient deferred. Pt provided with contact info and advised to call to schedule appt.  Lung Cancer Screening: (Low Dose CT Chest recommended if Age 73-80years, 30 pack-year currently  smoking OR have quit w/in 15years.) does not qualify.     Additional Screening:  Hepatitis C Screening: does qualify; Completed Patient Deferred  Vision Screening: Recommended annual ophthalmology exams for early detection of glaucoma and other disorders of the eye. Is the patient up to date with  their annual eye exam?  Yes  Who is the provider or what is the name of the office in which the patient attends annual eye exams? Dr Sherral Hammers If pt is not established with a provider, would they like to be referred to a provider to establish care? No .   Dental Screening: Recommended annual dental exams for proper oral hygiene  Community Resource Referral / Chronic Care Management:  CRR required this visit?  No   CCM required this visit?  No      Plan:     I have personally reviewed and noted the following in the patient's chart:   Medical and social history Use of alcohol, tobacco or illicit drugs  Current medications and supplements including opioid prescriptions. Patient is not currently taking opioid prescriptions. Functional ability and status Nutritional status Physical activity Advanced directives List of other physicians Hospitalizations, surgeries, and ER visits in previous 12 months Vitals Screenings to include cognitive, depression, and falls Referrals and appointments  In addition, I have reviewed and discussed with patient certain preventive protocols, quality metrics, and best practice recommendations. A written personalized care plan for preventive services as well as general preventive health recommendations were provided to patient.     [name] Peaches, LPN   02/09/4173   Nurse Notes: None

## 2022-03-10 DIAGNOSIS — N1832 Chronic kidney disease, stage 3b: Secondary | ICD-10-CM | POA: Diagnosis not present

## 2022-03-14 DIAGNOSIS — N1832 Chronic kidney disease, stage 3b: Secondary | ICD-10-CM | POA: Diagnosis not present

## 2022-03-14 DIAGNOSIS — I5032 Chronic diastolic (congestive) heart failure: Secondary | ICD-10-CM | POA: Diagnosis not present

## 2022-03-14 DIAGNOSIS — I2510 Atherosclerotic heart disease of native coronary artery without angina pectoris: Secondary | ICD-10-CM | POA: Diagnosis not present

## 2022-03-14 DIAGNOSIS — M159 Polyosteoarthritis, unspecified: Secondary | ICD-10-CM | POA: Diagnosis not present

## 2022-03-14 DIAGNOSIS — N2581 Secondary hyperparathyroidism of renal origin: Secondary | ICD-10-CM | POA: Diagnosis not present

## 2022-03-14 DIAGNOSIS — I129 Hypertensive chronic kidney disease with stage 1 through stage 4 chronic kidney disease, or unspecified chronic kidney disease: Secondary | ICD-10-CM | POA: Diagnosis not present

## 2022-03-14 DIAGNOSIS — K219 Gastro-esophageal reflux disease without esophagitis: Secondary | ICD-10-CM | POA: Diagnosis not present

## 2022-03-14 DIAGNOSIS — E279 Disorder of adrenal gland, unspecified: Secondary | ICD-10-CM | POA: Diagnosis not present

## 2022-03-14 DIAGNOSIS — R918 Other nonspecific abnormal finding of lung field: Secondary | ICD-10-CM | POA: Diagnosis not present

## 2022-04-02 ENCOUNTER — Other Ambulatory Visit: Payer: Self-pay | Admitting: Family Medicine

## 2022-04-05 ENCOUNTER — Encounter: Payer: Self-pay | Admitting: *Deleted

## 2022-04-05 ENCOUNTER — Telehealth: Payer: Self-pay | Admitting: *Deleted

## 2022-04-05 NOTE — Patient Outreach (Signed)
Care Coordination   Multidisciplinary Case Review Note    04/05/2022 Name: [name] [name] MRN: [id number] DOB: Feb 19, 1951  [name] is a 72 y.o. year old female who sees [name], MD for primary care.  The  multidisciplinary care team met today to review patient care needs and barriers.     Goals Addressed               This Visit's Progress     COMPLETED: Transportation/housing/food/falls (pt-stated)        Care Coordination Interventions: Provided written and verbal education re: potential causes of falls and Fall prevention strategies Reviewed medications and discussed potential side effects of medications such as dizziness and frequent urination Advised patient of importance of notifying provider of falls Assessed for falls since last encounter Assessed patients knowledge of fall risk prevention secondary to previously provided education Provided patient information for fall alert systems Advised patient to discuss DME in needed in the future  with provider Screening for signs and symptoms of depression related to chronic disease state  Assessed social determinant of health barriers Educated on care management services utilizing social workers, Designer, jewellery and pharmacy. Pt having issues with MyChart. RN offered to send today's assessment via mail out and provide the MyChart contact to assist with obtaining access. Collaborated with Daneen Schick social worker for transportation- pt needs backup source and receptive to a SCATs application/housing-pt reports she will need a place to stay in about 6-8 months and food resources within the community. Will also refer for fall alert systems for fall prevention resources through social worker due to several falls over the past year. Pt declined care management services related to fall prevention. Other discussion related to rash that has developed due to taking a new medication. MD aware and informed pt to take  benadryl however rash continues. RN discuss possible consult with a dermatologist for her ongoing rash if there is no relief from the recommendations from the provider or OTC medications (pt receptive).   RN offered follow up over the next few weeks on the referral-pt declined and indicated she would reach out if there are further needs. No other needs presented at this time.         SDOH assessments and interventions completed:  Yes  SDOH Interventions Today    Flowsheet Row Most Recent Value  SDOH Interventions   Food Insecurity Interventions --  [Referral made to social worker Tillie Rung [place])]  Housing Interventions Other (Comment)  [Will need place to stay in 6-8 months. Referral to Daneen Schick (social worker)]  Transportation Interventions Other (Comment)  [Referral for back-up Building surveyor (Education officer, museum).]  Utilities Interventions Intervention Not Indicated        Care Coordination Interventions Activated:  Yes   Care Coordination Interventions:  Yes, provided   Follow up plan: No further intervention required.   Multidisciplinary Team Attendees: Will present to POD meeting on 04/10/2022-Mozell Hardacre Marlowe [place], Roshanda Florance, Christa See.  Scribe for Multidisciplinary Case Review:  Pt in need of several resource for her ongoing self management of care. Collaboration and referral sent to Daneen Schick for (Transportation/food/housing and fall alert system agencies). Pt also had an ongoing rash from  a new medications. Although this was reported to her provider pt continued have this rash. Encouraged pt to seek a dermatologist if not resolved or further irration occurred.   Pt very grateful for the services rendered and declined any further follow up needs at this  time.  Raina Mina, RN Care Management Coordinator [place] Office [phone]

## 2022-04-05 NOTE — Patient Outreach (Unsigned)
  Care Coordination   Multidisciplinary Case Review Note    04/05/2022 Name: [name] [name] MRN: [id number] DOB: 06-08-1950  [name] [name] is a 72 y.o. year old female who sees [name] Morale, MD for primary care.  The  multidisciplinary care team met today to review patient care needs and barriers.     Goals Addressed   None     SDOH assessments and interventions completed:  {yes/no:20286}{THN Tip this will not be part of the note when signed-REQUIRED REPORT FIELD DO NOT DELETE (Optional):27901}     Care Coordination Interventions Activated:  {CCYES/NO:27769} {THN Tip this will not be part of the note when signed-REQUIRED REPORT FIELD DO NOT DELETE (Optional):27901}  Care Coordination Interventions:  {INTERVENTIONS:27767} {THN Tip this will not be part of the note when signed-REQUIRED REPORT FIELD DO NOT DELETE (Optional):27901}  Follow up plan: {CCFOLLOWUP:27768}   Multidisciplinary Team Attendees:   ***  Scribe for Multidisciplinary Case Review:  ***

## 2022-04-05 NOTE — Patient Instructions (Signed)
Visit Information  Thank you for taking time to visit with me today. Please don't hesitate to contact me if I can be of assistance to you.   Following are the goals we discussed today:   Goals Addressed               This Visit's Progress     COMPLETED: Transportation/housing/food/falls (pt-stated)        Care Coordination Interventions: Provided written and verbal education re: potential causes of falls and Fall prevention strategies Reviewed medications and discussed potential side effects of medications such as dizziness and frequent urination Advised patient of importance of notifying provider of falls Assessed for falls since last encounter Assessed patients knowledge of fall risk prevention secondary to previously provided education Provided patient information for fall alert systems Advised patient to discuss DME in needed in the future  with provider Screening for signs and symptoms of depression related to chronic disease state  Assessed social determinant of health barriers Educated on care management services utilizing social workers, Designer, jewellery and pharmacy. Pt having issues with MyChart. RN offered to send today's assessment via mail out and provide the MyChart contact to assist with obtaining access. Collaborated with Daneen Schick social worker for transportation- pt needs backup source and receptive to a SCATs application/housing-pt reports she will need a place to stay in about 6-8 months and food resources within the community. Will also refer for fall alert systems for fall prevention resources through social worker due to several falls over the past year. Pt declined care management services related to fall prevention. Other discussion related to rash that has developed due to taking a new medication. MD aware and informed pt to take benadryl however rash continues. RN discuss possible consult with a dermatologist for her ongoing rash if there is no relief from  the recommendations from the provider or OTC medications (pt receptive).   RN offered follow up over the next few weeks on the referral-pt declined and indicated she would reach out if there are further needs. No other needs presented at this time.         Please call the care guide team at [phone] if you need to cancel or reschedule your appointment.   If you are experiencing a Mental Health or [place] or need someone to talk to, please call the Suicide and Crisis Lifeline: 988  The patient verbalized understanding of instructions, educational materials, and care plan provided today and agreed to receive a mailed copy of patient instructions, educational materials, and care plan.   No further follow up required: No follow up needs  Raina Mina, RN Care Management Coordinator [place] Office [phone]

## 2022-04-05 NOTE — Telephone Encounter (Unsigned)
This encounter was created in error - please disregard.  This encounter was created in error - please disregard.

## 2022-04-05 NOTE — Patient Outreach (Signed)
  Care Coordination   Initial Visit Note   04/05/2022 Name: [name] [name] MRN: [id number] DOB: 15-Sep-1950  [name] [name] is a 72 y.o. year old female who sees [name], MD for primary care. I spoke with  [name] by phone today.  What matters to the patients health and wellness today?  Transportation/housing,food resources and fall related services.    Goals Addressed               This Visit's Progress     COMPLETED: Transportation/housing/food/falls (pt-stated)        Care Coordination Interventions: Provided written and verbal education re: potential causes of falls and Fall prevention strategies Reviewed medications and discussed potential side effects of medications such as dizziness and frequent urination Advised patient of importance of notifying provider of falls Assessed for falls since last encounter Assessed patients knowledge of fall risk prevention secondary to previously provided education Provided patient information for fall alert systems Advised patient to discuss DME in needed in the future  with provider Screening for signs and symptoms of depression related to chronic disease state  Assessed social determinant of health barriers Educated on care management services utilizing social workers, Designer, jewellery and pharmacy. Pt having issues with MyChart. RN offered to send today's assessment via mail out and provide the MyChart contact to assist with obtaining access. Collaborated with Daneen Schick social worker for transportation- pt needs backup source and receptive to a SCATs application/housing-pt reports she will need a place to stay in about 6-8 months and food resources within the community. Will also refer for fall alert systems for fall prevention resources through social worker due to several falls over the past year. Pt declined care management services related to fall prevention. Other discussion related to rash that has developed  due to taking a new medication. MD aware and informed pt to take benadryl however rash continues. RN discuss possible consult with a dermatologist for her ongoing rash if there is no relief from the recommendations from the provider or OTC medications (pt receptive).   RN offered follow up over the next few weeks on the referral-pt declined and indicated she would reach out if there are further needs. No other needs presented at this time.         SDOH assessments and interventions completed:  Yes  SDOH Interventions Today    Flowsheet Row Most Recent Value  SDOH Interventions   Food Insecurity Interventions --  [Referral made to social worker Tillie Rung [place])]  Housing Interventions Other (Comment)  [Will need place to stay in 6-8 months. Referral to Daneen Schick (social worker)]  Transportation Interventions Other (Comment)  [Referral for back-up Building surveyor (Education officer, museum).]  Utilities Interventions Intervention Not Indicated        Care Coordination Interventions:  Yes, provided   Follow up plan: No further intervention required.   Encounter Outcome:  Pt. Visit Completed   Raina Mina, RN Care Management Coordinator [place] Office [phone]

## 2022-04-07 ENCOUNTER — Telehealth: Payer: Self-pay

## 2022-04-07 NOTE — Patient Outreach (Signed)
  Care Coordination   04/07/2022 Name: [name] [name] MRN: [id number] DOB: 1950/11/05   Care Coordination Outreach Attempts:  An unsuccessful telephone outreach was attempted today to offer the patient information about available care coordination services as a benefit of their health plan.   Follow Up Plan:  Additional outreach attempts will be made to offer the patient care coordination information and services.   Encounter Outcome:  No Answer   Care Coordination Interventions:  No, not indicated    Daneen Schick, BSW, CDP Social Worker, Certified Dementia Practitioner [facility]  Care Coordination [phone]

## 2022-04-11 ENCOUNTER — Telehealth: Payer: Self-pay

## 2022-04-11 NOTE — Patient Outreach (Signed)
  Care Coordination   04/11/2022 Name: [name] [name] MRN: [id number] DOB: [birth year]   Care Coordination Outreach Attempts:  A second unsuccessful outreach was attempted today to offer the patient with information about available care coordination services as a benefit of their health plan.     Follow Up Plan:  Additional outreach attempts will be made to offer the patient care coordination information and services.   Encounter Outcome:  No Answer   Care Coordination Interventions:  No, not indicated    Daneen Schick, BSW, CDP Social Worker, Certified Dementia Practitioner [facility]  Care Coordination [phone]

## 2022-04-12 ENCOUNTER — Other Ambulatory Visit: Payer: Self-pay | Admitting: Family Medicine

## 2022-04-12 DIAGNOSIS — K219 Gastro-esophageal reflux disease without esophagitis: Secondary | ICD-10-CM

## 2022-04-14 ENCOUNTER — Ambulatory Visit: Payer: Self-pay

## 2022-04-14 NOTE — Patient Instructions (Signed)
Visit Information  Thank you for taking time to visit with me today. Please don't hesitate to contact me if I can be of assistance to you.   Following are the goals we discussed today:   Goals Addressed             This Visit's Progress    Care Coordination Activities       Care Coordination Interventions: Collaboration with Edgar who requests SW support for SDoH needs including housing, food insecurity, transportation, and falls Reviewed patient is facing financial hardship and is interested in completing a Medicaid application - patient is just over the limit for full Medicaid benefits. SW educated on [place] MQ[place] which will cover the cost of her Medicare premiums if approved. This will allow patient to keep $178 per month to use toward other living expenses Medicaid application mailed to the patients home per her request - SW will assist with completion as needed Housing: Patient resides in a home that is in both the patients name and her partners; her partner is now placed in [place] and patient is having a hard time covering the mortgage among other bills on one income Discussed the patient would like to move into an apartment - reviewed long wait lists of senior low income housing Reviewed patient is unsure she will be able to sell the home as her partner would not agree to the sale. Patients partner is also using LTC Medicaid which will expect to recoup payment after the sale of the home Patient is not interested in placement into ALF or Independent Living at this time Food Insecurity: Patient does endorse difficulty affording groceries but denies running out of food in the home Discussed plan for SW to assist with application for Food and Nutrition Services; patient would like SW to mail paper application to patients home. SW will assist with completion as needed Education on free mobile app "[employer]" to allow the patient to locate food pantry options close  to home Transportation: Determined the patient is still currently driving although she does not feel it is safe to continue to do so SW educated the patient on [employer] (SCAT) - SW to assist with application Falls: Reviewed patient is a high fall risk. Vision is impacted and patient does not use a mobility aid Patient reports her last "big fall" to have occurred on 12/29 - patient fell onto face and hurt her nose and knee. EMS was called and patient declined transport to the ED Assessed for dizziness prior to falls - patient stated "I am not sure the top half of my body just goes sideways" Discussed the importance of notifying patients primary care provider of falls in order to determine if interventions are needed Explained possible fall interventions to include home health PT for gait and balance, adding a mobility aid such as a walker or cane, assessing for changes in blood pressure upon standing, as well as any other options her provider may feel is needed Patient agreeable to SW contacting her primary care provider to notify of concern for falls Collaboration with Dr. Sarajane Jews regarding concern for falls. Dr. Sarajane Jews would like to see patient in clinic and requests SW assistance with transportation; SW will work with patient to get her scheduled for an office visit. SW will assist with transportation to and from clinic if patient is agreeable         As discussed, our next appointment is by telephone on 05/01/21 at 1:30  pm. I will contact you Monday 1/15 to get you scheduled to see Dr. Sarajane Jews.  Please call the care guide team at [phone] if you need to cancel or reschedule your appointment.   If you are experiencing a Mental Health or [place] or need someone to talk to, please call 911  Patient verbalizes understanding of instructions and care plan provided today and agrees to view in [place]. Active MyChart status and patient understanding of how to access instructions  and care plan via MyChart confirmed with patient.     Daneen Schick, BSW, CDP Social Worker, Certified Dementia Practitioner [facility]  Care Coordination [phone]

## 2022-04-14 NOTE — Patient Outreach (Addendum)
Care Coordination   Follow Up Visit Note   04/14/2022 Name: [name] [name] MRN: [id number] DOB: 09-25-1950  [name] [name] is a 72 y.o. year old female who sees [name] Morale, MD for primary care. I spoke with  [name] [name] by phone today.  What matters to the patients health and wellness today?  Resource education and linking    Goals Addressed             This Visit's Progress    Care Coordination Activities       Care Coordination Interventions: Collaboration with RN Care Manager who requests SW support for SDoH needs including housing, food insecurity, transportation, and falls Reviewed patient is facing financial hardship and is interested in completing a Medicaid application - patient is just over the limit for full Medicaid benefits. SW educated on [place] which will cover the cost of her Medicare premiums if approved. This will allow patient to keep $178 per month to use toward other living expenses Medicaid application mailed to the patients home per her request - SW will assist with completion as needed Housing: Patient resides in a home that is in both the patients name and her partners; her partner is now placed in [name] [name] and patient is having a hard time covering the mortgage among other bills on one income Discussed the patient would like to move into an apartment - reviewed long wait lists of senior low income housing Reviewed patient is unsure she will be able to sell the home as her partner would not agree to the sale. Patients partner is also using LTC Medicaid which will expect to recoup payment after the sale of the home Patient is not interested in placement into ALF or Independent Living at this time Food Insecurity: Patient does endorse difficulty affording groceries but denies running out of food in the home Discussed plan for SW to assist with application for Food and Nutrition Services; patient would like SW to mail paper application to patients  home. SW will assist with completion as needed Education on free mobile app "[name] [name]" to allow the patient to locate food pantry options close to home Transportation: Determined the patient is still currently driving although she does not feel it is safe to continue to do so SW educated the patient on [employer] (SCAT) - SW to assist with application Falls: Reviewed patient is a high fall risk. Vision is impacted and patient does not use a mobility aid Patient reports her last "big fall" to have occurred on 12/29 - patient fell onto face and hurt her nose and knee. EMS was called and patient declined transport to the ED Assessed for dizziness prior to falls - patient stated "I am not sure the top half of my body just goes sideways" Discussed the importance of notifying patients primary care provider of falls in order to determine if interventions are needed Explained possible fall interventions to include home health PT for gait and balance, adding a mobility aid such as a walker or cane, assessing for changes in blood pressure upon standing, as well as any other options her provider may feel is needed Patient agreeable to SW contacting her primary care provider to notify of concern for falls Collaboration with Dr. Sarajane Jews regarding concern for falls. Dr. Sarajane Jews would like to see patient in clinic and requests SW assistance with transportation; SW will work with patient to get her scheduled for an office visit. SW will assist with  transportation to and from clinic if patient is agreeable         SDOH assessments and interventions completed:  Yes  SDOH Interventions Today    Flowsheet Row Most Recent Value  SDOH Interventions   Food Insecurity Interventions Other (Comment)  [FNS application to help alleviate financial strain]  Housing Interventions Other (Comment)  [difficulty affording home and insurance]  Transportation Interventions Other (Comment), SCAT ([facility]ized  [facility] [place])  Management consultant with ability to continue driving,  SCAT application initiated]  Financial Strain Interventions Other (Comment)  [assist with connecting to resources to assist with medical costs, food, and transportation]        Care Coordination Interventions:  Yes, provided   Follow up plan: Follow up call scheduled for 05/01/22. SW will contact the patient on 1/15 to assist with scheduling an office visit with her provider.    Encounter Outcome:  Pt. Visit Completed   [name] [name], BSW, CDP Social Worker, Certified Dementia Practitioner [facility]  Care Coordination [phone]

## 2022-04-17 ENCOUNTER — Ambulatory Visit: Payer: Self-pay

## 2022-04-17 NOTE — Patient Outreach (Signed)
  Care Coordination   Follow Up Visit Note   04/17/2022 Name: [name] [name] MRN: [id number] DOB: [birth year]  [name] [name] is a 72 y.o. year old female who sees [name] Morale, MD for primary care. I  collaborated with patients primary care provider regarding SCAT application for transportation.  What matters to the patients health and wellness today?  Unsuccessful call to the patient    Goals Addressed             This Visit's Progress    Care Coordination Activities       Care Coordination Interventions: Attempted to contact the patient regarding [name] [name] request to schedule an office visit - voice message left requesting a return call Completed SCAT Part A application ; faxed Part B to [name] [name] requesting completion SW will attempt to contact the patient again over the next 2 business days        SDOH assessments and interventions completed:  No     Care Coordination Interventions:  Yes, provided   Follow up plan:  SW will continue to follow    Encounter Outcome:  Pt. Visit Completed   [name] [name], CDP Social Worker, Certified Dementia Practitioner [name]  Care Coordination [phone]

## 2022-04-18 ENCOUNTER — Telehealth: Payer: Self-pay

## 2022-04-18 NOTE — Patient Outreach (Signed)
  Care Coordination   Unsuccessful Call   04/18/2022 Name: [name] [name] MRN: [id number] DOB: 1950/04/11  [name] [name] is a 72 y.o. year old female who sees [name], MD for primary care. I  attempted to contact the patient by phone.   Goals Addressed             This Visit's Progress    Care Coordination Activities       Care Coordination Interventions: Second unsuccessful call placed to the patient - voice message left requesting patient contact her primary care office to schedule an office visit SW will follow up with the patient over the next month as previously scheduled        SDOH assessments and interventions completed:  No     Care Coordination Interventions:  No, not indicated   Follow up plan:  SW will continue to follow    Encounter Outcome:  No Answer   Daneen Schick, BSW, CDP Social Worker, Certified Dementia Practitioner [facility]  Care Coordination [phone]

## 2022-04-24 ENCOUNTER — Ambulatory Visit (INDEPENDENT_AMBULATORY_CARE_PROVIDER_SITE_OTHER): Payer: Medicare Other | Admitting: Family Medicine

## 2022-04-24 ENCOUNTER — Encounter: Payer: Self-pay | Admitting: Family Medicine

## 2022-04-24 VITALS — BP 134/82 | HR 53 | Temp 98.3°F | Wt 191.4 lb

## 2022-04-24 DIAGNOSIS — S8392XA Sprain of unspecified site of left knee, initial encounter: Secondary | ICD-10-CM | POA: Diagnosis not present

## 2022-04-24 DIAGNOSIS — R296 Repeated falls: Secondary | ICD-10-CM | POA: Diagnosis not present

## 2022-04-24 DIAGNOSIS — M069 Rheumatoid arthritis, unspecified: Secondary | ICD-10-CM | POA: Diagnosis not present

## 2022-04-24 DIAGNOSIS — R413 Other amnesia: Secondary | ICD-10-CM

## 2022-04-24 DIAGNOSIS — M159 Polyosteoarthritis, unspecified: Secondary | ICD-10-CM | POA: Diagnosis not present

## 2022-04-24 MED ORDER — DONEPEZIL HCL 5 MG PO TABS: 5.0000 mg | ORAL_TABLET | Freq: Every day | ORAL | 1 refills | Status: DC

## 2022-04-24 NOTE — Progress Notes (Signed)
   Subjective:    Patient ID: [name] [name], female    DOB: 1950/08/12, 72 y.o.   MRN: [id number]  HPI Here for several issues. First she has fallen 5 times in the past few months, and she does not know why she falls. These have all occurred at home. She admits to feeling her legs get very weak at times, and she has trouble with balance. She has a walker at home but she does not use this. During the last of these falls (about 2 weeks  ago) she twisted her left knee and she has had pain in this knee ever since. It swells and feels unsteady to her. She initially applied ice but not now. Also we have been working with [employer] to help her, and the Education officer, museum has been trying to contact her with no luck. Today [name] [name] admits to keeping her phone off the hook so as to avoid solicitors. Finally we have talked about her memory loss, and we had offered her medication to try for this. At first she declined, but now she wants to try it.    Review of Systems  Constitutional: Negative.   Respiratory: Negative.    Cardiovascular: Negative.   Gastrointestinal: Negative.   Genitourinary: Negative.   Musculoskeletal:  Positive for arthralgias.  Neurological:  Positive for dizziness and weakness. Negative for tremors, seizures, syncope, facial asymmetry, speech difficulty, light-headedness, numbness and headaches.       Objective:   Physical Exam Constitutional:      Comments: She walks well unassisted   Cardiovascular:     Rate and Rhythm: Normal rate and regular rhythm.     Pulses: Normal pulses.     Heart sounds: Normal heart sounds.  Pulmonary:     Effort: Pulmonary effort is normal.     Breath sounds: Normal breath sounds.  Musculoskeletal:     Comments: The left knee is mildly swollen and is very tender in both joint spaces. ROM is limited by pain. No warmth or erythema.   Neurological:     Mental Status: She is alert and oriented to person, place, and time.  Psychiatric:         Mood and Affect: Mood normal.           Assessment & Plan:  She is having frequent falls, and there are likely multiple factors at work. I offered to send her to PT for balance training, but she declined. I did insist that she begin using a walker any time she leaves her house, and she agreed. She has likely torn some cartilage in the left knee, so we will refer her to Orthopedics for this. I gave her the name and number of the social worker at [facility] who has been trying to reach her, and I advised [name] [name] to call her asap. For the memory loss, she will try Aricept 5 mg daily. Recheck in 4 weeks.  Alysia Penna, MD

## 2022-04-28 ENCOUNTER — Ambulatory Visit (HOSPITAL_BASED_OUTPATIENT_CLINIC_OR_DEPARTMENT_OTHER): Payer: Medicare Other | Admitting: Orthopaedic Surgery

## 2022-05-01 ENCOUNTER — Ambulatory Visit: Payer: Self-pay

## 2022-05-01 NOTE — Patient Outreach (Signed)
  Care Coordination   Follow Up Visit Note   05/01/2022 Name: [name] [name] MRN: [id number] DOB: 1950-06-26  [name] [name] is a 72 y.o. year old female who sees [name], MD for primary care. I spoke with  [name] [name] by phone today.  What matters to the patients health and wellness today?  I need to get directions for my appointment on Friday    Goals Addressed             This Visit's Progress    Care Coordination Activities       Care Coordination Interventions: Spoke with patient who reports she had another fall this past Wednesday - denies injury Discussed patient has an appointment with orthopedic provider Friday regarding twisted knee from previous fall Reviewed driving directions with the patient to help her understand how to get to appointment Advised SW has submitted a SCAT application on behalf of the patient to assist with transportation needs Confirmed receipt of Medicaid and FNS application - patient unsure if she will complete Encouraged patient to complete if she has time in order to obtain some financial assistance Discussed plans for SW to follow up with patient over the next two weeks        SDOH assessments and interventions completed:  No     Care Coordination Interventions:  Yes, provided   Follow up plan: Follow up call scheduled for 2/12    Encounter Outcome:  Pt. Visit Completed   Daneen Schick, Arita Miss, CDP Social Worker, Certified Dementia Practitioner [place] [facility]  Care Coordination [phone]

## 2022-05-01 NOTE — Patient Instructions (Signed)
Visit Information  Thank you for taking time to visit with me today. Please don't hesitate to contact me if I can be of assistance to you.   Following are the goals we discussed today:   Goals Addressed             This Visit's Progress    Care Coordination Activities       Care Coordination Interventions: Spoke with patient who reports she had another fall this past Wednesday - denies injury Discussed patient has an appointment with orthopedic provider Friday regarding twisted knee from previous fall Reviewed driving directions with the patient to help her understand how to get to appointment Advised SW has submitted a SCAT application on behalf of the patient to assist with transportation needs Confirmed receipt of Medicaid and FNS application - patient unsure if she will complete Encouraged patient to complete if she has time in order to obtain some financial assistance Discussed plans for SW to follow up with patient over the next two weeks       Driving Directions:  1- [place] to [employer] [address] 2- Turn left on [address] 3- After you pass [place] you will go under a bridge 4- Turn left onto [address] (it is at a stop light - you will see Francee Piccolo) 5- Turn Right into [place] parking lot [place], [address]  Our next appointment is by telephone on 2/12 at 12:30  Please call the care guide team at [phone] if you need to cancel or reschedule your appointment.   If you are experiencing a Mental Health or [place] or need someone to talk to, please call 911  Patient verbalizes understanding of instructions and care plan provided today and agrees to view in [place]. Active MyChart status and patient understanding of how to access instructions and care plan via MyChart confirmed with patient.     Telephone follow up appointment with care management team member scheduled for:2/12  Daneen Schick,  Arita Miss, CDP Social Worker, Certified Dementia Practitioner [facility]  Care Coordination [phone]

## 2022-05-05 ENCOUNTER — Other Ambulatory Visit: Payer: Self-pay | Admitting: Family Medicine

## 2022-05-05 ENCOUNTER — Ambulatory Visit (HOSPITAL_BASED_OUTPATIENT_CLINIC_OR_DEPARTMENT_OTHER): Payer: Medicare Other | Admitting: Orthopaedic Surgery

## 2022-05-05 DIAGNOSIS — K219 Gastro-esophageal reflux disease without esophagitis: Secondary | ICD-10-CM

## 2022-05-15 ENCOUNTER — Telehealth: Payer: Self-pay

## 2022-05-15 NOTE — Patient Outreach (Signed)
  Care Coordination   05/15/2022 Name: [name] [name] MRN: [id number] DOB: 1950/07/22   Care Coordination Outreach Attempts:  An unsuccessful telephone outreach was attempted for a scheduled appointment today.  Follow Up Plan:  Additional outreach attempts will be made to offer the patient care coordination information and services.   Encounter Outcome:  No Answer   Care Coordination Interventions:  No, not indicated     Daneen Schick, BSW, CDP Social Worker, Certified Dementia Practitioner [facility]  Care Coordination [phone]

## 2022-05-17 ENCOUNTER — Ambulatory Visit (INDEPENDENT_AMBULATORY_CARE_PROVIDER_SITE_OTHER): Payer: Medicare Other

## 2022-05-17 ENCOUNTER — Ambulatory Visit (INDEPENDENT_AMBULATORY_CARE_PROVIDER_SITE_OTHER): Payer: Medicare Other | Admitting: Orthopaedic Surgery

## 2022-05-17 DIAGNOSIS — M25562 Pain in left knee: Secondary | ICD-10-CM

## 2022-05-17 MED ORDER — TRIAMCINOLONE ACETONIDE 40 MG/ML IJ SUSP
80.0000 mg | INTRAMUSCULAR | Status: AC | PRN
  Administered 2022-05-17: 80 mg via INTRA_ARTICULAR

## 2022-05-17 MED ORDER — LIDOCAINE HCL 1 % IJ SOLN
4.0000 mL | INTRAMUSCULAR | Status: AC | PRN
  Administered 2022-05-17: 4 mL

## 2022-05-17 NOTE — Progress Notes (Signed)
Chief Complaint: Left knee pain     History of Present Illness:    [name] [name] is a 72 y.o. female presents today with left knee pain which has been ongoing since December 8 when she had a fall directly onto the knee.  She did experience swelling and bruising at that time.  Since that time she feels the pain behind the kneecap.  There has been swelling about the knee.  Her bruising has improved although she is still persistently having pain deep in the knee.  She is here today for further assessment.    Surgical History:   None  PMH/PSH/Family History/Social History/Meds/Allergies:    Past Medical History:  Diagnosis Date  . Allergic rhinitis   . ALLERGIC RHINITIS 05/08/2007   Qualifier: Diagnosis of  By: Sherlynn Stalls, CMA, [place]    . Allergy    seasonal  . Anxiety   . Anxiety state 05/25/2014  . Aortic valve disease    a. ? possible bicuspid AV.  Marland Kitchen CAD (coronary artery disease)    a. nonobstructive by prior cath  . Cataract    bilateral  . Chronic low back pain 03/09/2016  . Complication of anesthesia 2003   Medication for block "went up to my brain and I stopped breathing"  . COPD (chronic obstructive pulmonary disease) ([place])   . COPD (chronic obstructive pulmonary disease) with chronic bronchitis 10/12/2014  . Depression   . Depression, major, single episode, moderate ([place]) 05/08/2007   Qualifier: Diagnosis of  By: Sherlynn Stalls, CMA, [place]    . Dexamethasone adverse reaction    2002 normal  . Dilated aortic root (HCC)    a. mild dilated aortic root on prior echo, ascending aorta prominence on CT 2015.  . Duodenal mass   . GERD 05/08/2007   Qualifier: Diagnosis of  By: Sherlynn Stalls, CMA, [place]    . Hyperlipidemia   . Hypertension   . Insomnia   . Neck pain, chronic 03/09/2016  . NICOTINE ADDICTION 05/08/2007   Qualifier: Diagnosis of  By: Sarajane Jews MD, Ishmael Holter   . Osteoarthritis   . Overactive bladder   . Pneumonia    once or twivce in past  .  Premature ventricular contractions    LBBB inferior axis PVCs  . Thoracic back pain 05/30/2014  . Tobacco abuse   . Ulcer    duodenal   Past Surgical History:  Procedure Laterality Date  . BREAST BIOPSY    . CARDIAC CATHETERIZATION N/A 12/03/2015   Procedure: Left Heart Cath and Coronary Angiography;  Surgeon: Burnell Blanks, MD;  Location: [facility];  Service: Cardiovascular;  Laterality: N/A;  . CARPAL TUNNEL RELEASE Left    1997 left,    . CHOLECYSTECTOMY N/A 03/23/2015   Procedure: LAPAROSCOPIC CHOLECYSTECTOMY;  Surgeon: Ralene Ok, MD;  Location: [facility];  Service: General;  Laterality: N/A;  . COLONOSCOPY  10/17/2013   per Dr. Olevia Perches, benign polyps, repeat in 10 years  . EUS N/A 11/12/2014   Procedure: UPPER ENDOSCOPIC ULTRASOUND (EUS) LINEAR;  Surgeon: Milus Banister, MD;  Location: [facility];  Service: Endoscopy;  Laterality: N/A;  . EUS N/A 06/08/2016   Procedure: UPPER ENDOSCOPIC ULTRASOUND (EUS) RADIAL;  Surgeon: Milus Banister, MD;  Location: [facility];  Service: Endoscopy;  Laterality: N/A;  . EYE SURGERY    .  LEFT AND RIGHT HEART CATHETERIZATION WITH CORONARY ANGIOGRAM N/A 04/22/2014   Procedure: LEFT AND RIGHT HEART CATHETERIZATION WITH CORONARY ANGIOGRAM;  Surgeon: Burnell Blanks, MD;  Location: [facility];  Service: Cardiovascular;  Laterality: N/A;  . TONSILLECTOMY  1950  . torn cartilage repaired rt wrist Right 2003   Social History   Socioeconomic History  . Marital status: Single    Spouse name: Not on file  . Number of children: 0  . Years of education: Not on file  . Highest education level: Not on file  Occupational History  . Occupation: CNA  Tobacco Use  . Smoking status: Former    Packs/day: 0.45    Years: 54.00    Total pack years: 24.30    Types: Cigarettes    Quit date: 01/01/2017    Years since quitting: 5.3  . Smokeless tobacco: Never  . Tobacco comments:    1/2 pack per day or less  Vaping Use  .  Vaping Use: Never used  Substance and Sexual Activity  . Alcohol use: No    Alcohol/week: 0.0 standard drinks of alcohol  . Drug use: No  . Sexual activity: Not on file  Other Topics Concern  . Not on file  Social History Narrative   Works as Quarry manager at [employer].   Social Determinants of Health   Financial Resource Strain: High Risk (04/14/2022)   Overall Financial Resource Strain (CARDIA)   . Difficulty of Paying Living Expenses: Hard  Food Insecurity: No Food Insecurity (04/14/2022)   Hunger Vital Sign   . Worried About Charity fundraiser in the Last Year: Never true   . Ran Out of Food in the Last Year: Never true  Transportation Needs: No Transportation Needs (04/14/2022)   PRAPARE - Transportation   . Lack of Transportation (Medical): No   . Lack of Transportation (Non-Medical): No  Physical Activity: Inactive (03/06/2022)   Exercise Vital Sign   . Days of Exercise per Week: 0 days   . Minutes of Exercise per Session: 0 min  Stress: No Stress Concern Present (03/06/2022)   [place]   . Feeling of Stress : Not at all  Social Connections: Socially Isolated (03/06/2022)   Social Connection and Isolation Panel [NHANES]   . Frequency of Communication with Friends and Family: More than three times a week   . Frequency of Social Gatherings with Friends and Family: More than three times a week   . Attends Religious Services: Never   . Active Member of Clubs or Organizations: No   . Attends Archivist Meetings: Never   . Marital Status: Never married   Family History  Problem Relation Age of Onset  . CAD Brother   . Kidney disease Brother   . Emphysema Mother   . Other Father        brain tumor  . Throat cancer Brother   . Hyperlipidemia Other   . Hypertension Other   . Kidney disease Other   . Heart disease Other   . Colon cancer Neg Hx   . Esophageal cancer Neg Hx   . Stomach cancer Neg Hx    . Rectal cancer Neg Hx    Allergies  Allergen Reactions  . Augmentin [Amoxicillin-Pot Clavulanate] Nausea And Vomiting  . Clindamycin/Lincomycin Nausea And Vomiting  . Colchicine Diarrhea  . Flecainide Other (See Comments)    Conjunctivitis   . Hydroxychloroquine Rash   Current Outpatient  Medications  Medication Sig Dispense Refill  . albuterol (PROAIR HFA) 108 (90 Base) MCG/ACT inhaler Inhale 2 puffs into the lungs every 6 (six) hours as needed for wheezing. 8.5 each 11  . ALPRAZolam (XANAX) 0.25 MG tablet TAKE 3 TABLETS BY MOUTH EVERY 6 HOURS AS NEEDED 360 tablet 1  . aspirin EC 81 MG tablet Take 1 tablet (81 mg total) by mouth daily. 90 tablet 3  . atorvastatin (LIPITOR) 20 MG tablet TAKE 1 TABLET BY MOUTH EVERY DAY 90 tablet 1  . buPROPion (WELLBUTRIN XL) 300 MG 24 hr tablet TAKE 1 TABLET BY MOUTH EVERY DAY 90 tablet 3  . cetirizine (ZYRTEC) 10 MG tablet Take 10 mg by mouth daily as needed for allergies.     . cholecalciferol (VITAMIN D3) 25 MCG (1000 UNIT) tablet Take 1,000 Units by mouth daily.    Marland Kitchen donepezil (ARICEPT) 5 MG tablet Take 1 tablet (5 mg total) by mouth at bedtime. 30 tablet 1  . lisinopril (ZESTRIL) 10 MG tablet Take 10 mg by mouth daily.    . Magnesium 400 MG CAPS Take 400 mg by mouth daily. 90 capsule 3  . metoprolol tartrate (LOPRESSOR) 100 MG tablet Take 1 tablet (100 mg total) by mouth 2 (two) times daily. 180 tablet 3  . nitroGLYCERIN (NITROSTAT) 0.4 MG SL tablet Place 1 tablet (0.4 mg total) under the tongue every 5 (five) minutes as needed for chest pain. 25 tablet 3  . pantoprazole (PROTONIX) 40 MG tablet TAKE 1 TABLET BY MOUTH TWICE A DAY 180 tablet 1  . sertraline (ZOLOFT) 100 MG tablet TAKE 1 TABLET BY MOUTH EVERY DAY 90 tablet 3   No current facility-administered medications for this visit.   No results found.  Review of Systems:   A ROS was performed including pertinent positives and negatives as documented in the HPI.  Physical Exam :    Constitutional: NAD and appears stated age Neurological: Alert and oriented Psych: Appropriate affect and cooperative There were no vitals taken for this visit.   Comprehensive Musculoskeletal Exam:    Tenderness to palpation about the patellofemoral joint.  This is worse with approximately 30 degrees of flexion.  She is able to actively flex and extend.  No joint line tenderness.  There is some bruising in the prepatellar area.  Remainder of distal neurosensory exam is intact  Imaging:   Xray (4 views left knee): Mild patellofemoral osteoarthritis   I personally reviewed and interpreted the radiographs.   Assessment:   72 y.o. female with mild left knee patellofemoral osteoarthritis I do believe has been flared up with a direct fall onto this left knee.  Given the fact that she does appear to have an acute flare I have recommended ultrasound-guided injection of the left knee.  She would like to proceed with this today.  I will plan to see her back on an as-needed basis  Plan :    - Left knee ultrasound-guided injection performed after verbal consent obtained    Procedure Note  Patient: [name] [name]             Date of Birth: 04-04-1950           MRN: [id number]             Visit Date: 05/17/2022  Procedures: Visit Diagnoses:  1. Acute pain of left knee     Large Joint Inj: L knee on 05/17/2022 2:46 PM Indications: pain Details: 22 G 1.5 in needle, ultrasound-guided anterior  approach  Arthrogram: No  Medications: 4 mL lidocaine 1 %; 80 mg triamcinolone acetonide 40 MG/ML Outcome: tolerated well, no immediate complications Procedure, treatment alternatives, risks and benefits explained, specific risks discussed. Consent was given by the patient. Immediately prior to procedure a time out was called to verify the correct patient, procedure, equipment, support staff and site/side marked as required. Patient was prepped and draped in the usual sterile fashion.           I personally saw and evaluated the patient, and participated in the management and treatment plan.  Vanetta Mulders, MD Attending Physician, Orthopedic Surgery  This document was dictated using Dragon voice recognition software. A reasonable attempt at proof reading has been made to minimize errors.

## 2022-05-22 ENCOUNTER — Telehealth: Payer: Self-pay

## 2022-05-22 NOTE — Patient Outreach (Signed)
  Care Coordination   05/22/2022 Name: [name] [name] MRN: [id number] DOB: 16-Feb-1951   Care Coordination Outreach Attempts:  A second unsuccessful outreach was attempted today to offer the patient with information about available care coordination services as a benefit of their health plan.     Follow Up Plan:  Additional outreach attempts will be made to offer the patient care coordination information and services.   Encounter Outcome:  No Answer   Care Coordination Interventions:  No, not indicated    Daneen Schick, BSW, CDP Social Worker, Certified Dementia Practitioner [facility]  Care Coordination [phone]

## 2022-05-29 ENCOUNTER — Telehealth: Payer: Self-pay

## 2022-05-29 NOTE — Patient Outreach (Signed)
  Care Coordination   Case Closure  Visit Note   05/29/2022 Name: [name] [name] MRN: [id number] DOB: 16-Apr-1950  [name] [name] is a 72 y.o. year old female who sees [name] Morale, MD for primary care. I  placed a third unsuccessful outbound call to the patient in an attempt to assist with care coordination needs. Goal closed due to inability to maintain patient contact.    Goals Addressed             This Visit's Progress    COMPLETED: Care Coordination Activities       Care Coordination Interventions: Spoke with patient who reports she had another fall this past Wednesday - denies injury Discussed patient has an appointment with orthopedic provider Friday regarding twisted knee from previous fall Reviewed driving directions with the patient to help her understand how to get to appointment Advised SW has submitted a SCAT application on behalf of the patient to assist with transportation needs Confirmed receipt of Medicaid and FNS application - patient unsure if she will complete Encouraged patient to complete if she has time in order to obtain some financial assistance Discussed plans for SW to follow up with patient over the next two weeks        SDOH assessments and interventions completed:  No     Care Coordination Interventions:  No, not indicated   Follow up plan: No further intervention required.   Encounter Outcome:  No Answer   [name] [name], BSW, CDP Social Worker, Certified Dementia Practitioner [facility]  Care Coordination [phone]

## 2022-06-02 ENCOUNTER — Ambulatory Visit: Payer: Self-pay

## 2022-06-02 NOTE — Patient Outreach (Signed)
  Care Coordination   Follow Up Visit Note   06/02/2022 Name: Kaydon Kalbfleisch MRN: [id number] DOB: Aug 13, 1950  Carmelite Share Maslow is a 72 y.o. year old female who sees [name], MD for primary care. I spoke with  [name] by phone today.  What matters to the patients health and wellness today?  I want to feel better    Goals Addressed             This Visit's Progress    Care Coordination Activities       Care Coordination Interventions: Inbound call received from patient who indicates she was recently prescribed Aricept but chose to stop the medication after experiencing GI upset Reviewed care coordination team with patient; patient agreeable to engagement with RN Care Manager Scheduled appointment for patient to speak with RN Care Manager 3/4         SDOH assessments and interventions completed:  No     Care Coordination Interventions:  Yes, provided   Interventions Today    Flowsheet Row Most Recent Value  Chronic Disease   Chronic disease during today's visit Hypertension (HTN), Chronic Obstructive Pulmonary Disease (COPD)  General Interventions   General Interventions Discussed/Reviewed General Interventions Discussed, Referral to Nurse        Follow up plan: Referral made to RN Care Manager who will outreach the patient 3/4. SW to remain engaged for monthly check ins with the patient.    Encounter Outcome:  Pt. Visit Completed   Daneen Schick, BSW, CDP Social Worker, Certified Dementia Practitioner [facility]  Care Coordination [phone]

## 2022-06-02 NOTE — Patient Instructions (Signed)
Visit Information  Thank you for taking time to visit with me today. Please don't hesitate to contact me if I can be of assistance to you.   Following are the goals we discussed today:   Goals Addressed             This Visit's Progress    Care Coordination Activities       Care Coordination Interventions: Inbound call received from patient who indicates she was recently prescribed Aricept but chose to stop the medication after experiencing GI upset Reviewed care coordination team with patient; patient agreeable to engagement with RN Care Manager Scheduled appointment for patient to speak with RN Care Manager 3/4         Your next appointment is by telephone on 3/4 at 3:00 with the RN Dionne Leath.  Please call the care guide team at [phone] if you need to cancel or reschedule your appointment.   If you are experiencing a Mental Health or [place] or need someone to talk to, please call 911  Patient verbalizes understanding of instructions and care plan provided today and agrees to view in [place]. Active MyChart status and patient understanding of how to access instructions and care plan via MyChart confirmed with patient.     Telephone follow up appointment with care management team member scheduled for:3/4  Daneen Schick, BSW, CDP Social Worker, Certified Dementia Practitioner [place] Coordination [phone]

## 2022-06-05 ENCOUNTER — Ambulatory Visit: Payer: Self-pay

## 2022-06-05 NOTE — Patient Instructions (Signed)
Visit Information  Thank you for taking time to visit with me today. Please don't hesitate to contact me if I can be of assistance to you.   Following are the goals we discussed today:   Goals Addressed             This Visit's Progress    Health Maintenance & Independence       Patient Goals/Self Care Activities: -Patient/Caregiver will self-administer medications as prescribed as evidenced by self-report/primary caregiver report  -Patient/Caregiver will attend all scheduled provider appointments as evidenced by clinician review of documented attendance to scheduled appointments and patient/caregiver report -Patient/Caregiver will call provider office for new concerns or questions as evidenced by review of documented incoming telephone call notes and patient report  -Keep your airway clear from mucus build up  -Control your cough by drinking plenty of water -Visit your doctor on a regular basis -Practice and use pursed lip breathing for shortness of breath recovery and prevention -Self assess COPD action plan zone and make appointment with provider if you have been in the yellow zone for 48 hours without improvement. Pace self with activities   Patient with history of falls.  Discussed fall risk and discussed possible home declutter.  Will mention to social work,   Patient declines wanting to take aricept due to GI problems.  States she will discuss with PCP.   Working on [employer].    Interventions Today    Flowsheet Row Most Recent Value  Chronic Disease   Chronic disease during today's visit Chronic Obstructive Pulmonary Disease (COPD)  General Interventions   General Interventions Discussed/Reviewed General Interventions Discussed, Doctor Visits  Doctor Visits Discussed/Reviewed Doctor Visits Discussed  Education Interventions   Education Provided Provided Education  Provided Verbal Education On Other, When to see the doctor  [COPD [place]  Interventions   Pharmacy Dicussed/Reviewed Medications and their functions              Our next appointment is by telephone on 06/26/22 at 3pm  Please call the care guide team at [phone] if you need to cancel or reschedule your appointment.   If you are experiencing a Mental Health or [place] or need someone to talk to, please call the Suicide and Crisis Lifeline: 988   Patient verbalizes understanding of instructions and care plan provided today and agrees to view in [place]. Active MyChart status and patient understanding of how to access instructions and care plan via MyChart confirmed with patient.     The patient has been provided with contact information for the care management team and has been advised to call with any health related questions or concerns.   Jone Baseman, RN, MSN [facility] Care Management Coordinator Direct Line [phone]

## 2022-06-05 NOTE — Patient Outreach (Addendum)
  Care Coordination   Initial Visit Note   06/05/2022 Name: [name] [name] MRN: [id number] DOB: 04-Feb-1951  [name] [name] is a 72 y.o. year old female who sees [name], MD for primary care. I spoke with  [name] by phone today.  What matters to the patients health and wellness today?  Maintaining Independence and health.    Goals Addressed             This Visit's Progress    Health Maintenance & Independence       Patient Goals/Self Care Activities: -Patient/Caregiver will self-administer medications as prescribed as evidenced by self-report/primary caregiver report  -Patient/Caregiver will attend all scheduled provider appointments as evidenced by clinician review of documented attendance to scheduled appointments and patient/caregiver report -Patient/Caregiver will call provider office for new concerns or questions as evidenced by review of documented incoming telephone call notes and patient report  -Keep your airway clear from mucus build up  -Control your cough by drinking plenty of water -Visit your doctor on a regular basis -Practice and use pursed lip breathing for shortness of breath recovery and prevention -Self assess COPD action plan zone and make appointment with provider if you have been in the yellow zone for 48 hours without improvement. Pace self with activities   Patient with history of falls.  Discussed fall risk and discussed possible home declutter.  Will mention to social work,   Patient declines wanting to take aricept due to GI problems.  States she will discuss with PCP.   Working on [employer].    Interventions Today    Flowsheet Row Most Recent Value  Chronic Disease   Chronic disease during today's visit Chronic Obstructive Pulmonary Disease (COPD)  General Interventions   General Interventions Discussed/Reviewed General Interventions Discussed, Doctor Visits  Doctor Visits Discussed/Reviewed Doctor Visits Discussed   Education Interventions   Education Provided Provided Education  Provided Verbal Education On Other, When to see the doctor  [COPD [place] Interventions   Pharmacy Dicussed/Reviewed Medications and their functions              SDOH assessments and interventions completed:  Yes     Care Coordination Interventions:  Yes, provided   Follow up plan: Follow up call scheduled for 06/26/22    Encounter Outcome:  Pt. Visit Completed   Jone Baseman, RN, MSN [facility] Care Management Coordinator Direct Line [phone]

## 2022-06-22 ENCOUNTER — Ambulatory Visit: Payer: Self-pay

## 2022-06-22 NOTE — Patient Instructions (Signed)
Visit Information  Thank you for taking time to visit with me today. Please don't hesitate to contact me if I can be of assistance to you.   Following are the goals we discussed today:   Goals Addressed             This Visit's Progress    Care Coordination Activities       Care Coordination Interventions: Spoke with the patient who indicates she continues to experience increase in fall. Patient reports last fall was 3/20 when she was making coffee and fell backwards. Patient reports she has fallen about 8 times since January and they are all backwards Performed chart review to note patients primary care provider discussed a referral to PT during her last office visit but patient declined Determined the patient does not recall this conversation during her OV but she is unsure if she wants to engage with PT at this time Discussed the patient will think about it and review options with RN Care Manager during scheduled call on 3/25 Collaboration with RN Care Manager to advise of interventions and plan for patient to speak with her regarding falls Reviewed the patient continued to have difficulty managing household chores and is in need of assistance with taking out the trash - patient plans to speak with her HOA to inquire if they can assist her Discussed plan for SW to follow up over the next month         If you are experiencing a Mental Health or Center or need someone to talk to, please call 911  Patient verbalizes understanding of instructions and care plan provided today and agrees to view in [place]. Active MyChart status and patient understanding of how to access instructions and care plan via MyChart confirmed with patient.     Daneen Schick, BSW, CDP Social Worker, Certified Dementia Practitioner [place] [facility]  Care Coordination [phone]

## 2022-06-22 NOTE — Patient Outreach (Signed)
  Care Coordination   Follow Up Visit Note   06/22/2022 Name: [name] [name] MRN: [id number] DOB: [birth year]  [name] is a 72 y.o. year old female who sees [name], MD for primary care. I spoke with  [name] by phone today.  What matters to the patients health and wellness today?  To reduce number of falls    Goals Addressed             This Visit's Progress    Care Coordination Activities       Care Coordination Interventions: Spoke with the patient who indicates she continues to experience increase in fall. Patient reports last fall was 3/20 when she was making coffee and fell backwards. Patient reports she has fallen about 8 times since January and they are all backwards Performed chart review to note patients primary care provider discussed a referral to PT during her last office visit but patient declined Determined the patient does not recall this conversation during her OV but she is unsure if she wants to engage with PT at this time Discussed the patient will think about it and review options with RN Care Manager during scheduled call on 3/25 Collaboration with RN Care Manager to advise of interventions and plan for patient to speak with her regarding falls Reviewed the patient continued to have difficulty managing household chores and is in need of assistance with taking out the trash - patient plans to speak with her HOA to inquire if they can assist her Discussed plan for SW to follow up over the next month         SDOH assessments and interventions completed:  No     Care Coordination Interventions:  Yes, provided   Interventions Today    Flowsheet Row Most Recent Value  Chronic Disease   Chronic disease during today's visit Hypertension (HTN), Chronic Obstructive Pulmonary Disease (COPD)  General Interventions   General Interventions Discussed/Reviewed General Interventions Reviewed, Communication with  Communication with RN   Safety Interventions   Safety Discussed/Reviewed Fall Risk        Follow up plan:  SW will continue to follow    Encounter Outcome:  Pt. Visit Completed   Daneen Schick, Arita Miss, CDP Social Worker, Certified Dementia Practitioner [facility] Care Management  Care Coordination [phone]

## 2022-06-26 ENCOUNTER — Ambulatory Visit: Payer: Self-pay

## 2022-06-26 NOTE — Patient Instructions (Signed)
Visit Information  Thank you for taking time to visit with me today. Please don't hesitate to contact me if I can be of assistance to you.   Following are the goals we discussed today:   Goals Addressed             This Visit's Progress    Health Maintenance & Independence       Patient Goals/Self Care Activities: -Patient/Caregiver will self-administer medications as prescribed as evidenced by self-report/primary caregiver report  -Patient/Caregiver will attend all scheduled provider appointments as evidenced by clinician review of documented attendance to scheduled appointments and patient/caregiver report -Patient/Caregiver will call provider office for new concerns or questions as evidenced by review of documented incoming telephone call notes and patient report  -Keep your airway clear from mucus build up  -Control your cough by drinking plenty of water -Visit your doctor on a regular basis -Practice and use pursed lip breathing for shortness of breath recovery and prevention -Self assess COPD action plan zone and make appointment with provider if you have been in the yellow zone for 48 hours without improvement. Pace self with activities   Patient with history of falls.  Most recent fall yesterday.  Discussed fall risk.  Patient declined PT.    Still working on [employer] application.  Patient reports social work APS came out today.    Interventions Today    Flowsheet Row Most Recent Value  Chronic Disease   Chronic disease during today's visit Chronic Obstructive Pulmonary Disease (COPD), Hypertension (HTN), Other  [Falls]  General Interventions   General Interventions Discussed/Reviewed General Interventions Reviewed  Education Interventions   Education Provided Provided Education  Provided Verbal Education On When to see the doctor, Other  [Falls]  Nutrition Interventions   Nutrition Discussed/Reviewed Nutrition Reviewed, Nutrition Discussed  Pharmacy Interventions    Pharmacy Dicussed/Reviewed Pharmacy Topics Reviewed  Safety Interventions   Safety Discussed/Reviewed Fall Risk, Home Safety  Home Safety Assistive Devices              Our next appointment is by telephone on 07/10/22 at 145 pm  Please call the care guide team at [phone] if you need to cancel or reschedule your appointment.   If you are experiencing a Mental Health or [place] or need someone to talk to, please call the Suicide and Crisis Lifeline: 988   Patient verbalizes understanding of instructions and care plan provided today and agrees to view in [place]. Active MyChart status and patient understanding of how to access instructions and care plan via MyChart confirmed with patient.     The patient has been provided with contact information for the care management team and has been advised to call with any health related questions or concerns.   Jone Baseman, RN, MSN [place] [facility] Care Management Coordinator Direct Line [phone]

## 2022-06-26 NOTE — Patient Outreach (Signed)
  Care Coordination   Follow Up Visit Note   06/26/2022 Name: Nonie Tyrrell MRN: [id number] DOB: May 18, 1950  Taima Ridgeway Klose is a 72 y.o. year old female who sees [name], MD for primary care. I spoke with  [name] by phone today Patient had another fall on yesterday. No injuries.  She does not use any assistive devices due to home clutter per patient.  Discussed fall prevention. Patient reports that APS came out today. Patient thinks that EMS called as she had trash in her home and her repeated falls where they have to come get her up. Patient declining to go to a facility.Discussed possible PT referral. She declined that as well. Discussed her safety and that she really needs to go to a facility. Patient declining facility. She has not completed the [employer] application.  Telephone call to [place] worker Sonia Side @ [phone].  Message left.    What matters to the patients health and wellness today?  Maintain independence    Goals Addressed             This Visit's Progress    Health Maintenance & Independence       Patient Goals/Self Care Activities: -Patient/Caregiver will self-administer medications as prescribed as evidenced by self-report/primary caregiver report  -Patient/Caregiver will attend all scheduled provider appointments as evidenced by clinician review of documented attendance to scheduled appointments and patient/caregiver report -Patient/Caregiver will call provider office for new concerns or questions as evidenced by review of documented incoming telephone call notes and patient report  -Keep your airway clear from mucus build up  -Control your cough by drinking plenty of water -Visit your doctor on a regular basis -Practice and use pursed lip breathing for shortness of breath recovery and prevention -Self assess COPD action plan zone and make appointment with provider if you have been in the yellow zone for 48 hours without improvement. Pace self with  activities   Patient with history of falls.  Most recent fall yesterday.  Discussed fall risk.  Patient declined PT.    Still working on [employer] application.  Patient reports social work APS came out today.    Interventions Today    Flowsheet Row Most Recent Value  Chronic Disease   Chronic disease during today's visit Chronic Obstructive Pulmonary Disease (COPD), Hypertension (HTN), Other  [Falls]  General Interventions   General Interventions Discussed/Reviewed General Interventions Reviewed  Education Interventions   Education Provided Provided Education  Provided Verbal Education On When to see the doctor, Other  [Falls]  Nutrition Interventions   Nutrition Discussed/Reviewed Nutrition Reviewed, Nutrition Discussed  Pharmacy Interventions   Pharmacy Dicussed/Reviewed Pharmacy Topics Reviewed  Safety Interventions   Safety Discussed/Reviewed Fall Risk, Home Safety  Home Safety Assistive Devices              SDOH assessments and interventions completed:  Yes     Care Coordination Interventions:  Yes, provided   Follow up plan: Follow up call scheduled for April    Encounter Outcome:  Pt. Visit Completed {THN Tip this will not be part of the note when signed-REQUIRED REPORT FIELD DO NOT DELETE (Optional):27901  Jone Baseman, RN, MSN [facility] Care Management Coordinator Direct Line [phone]

## 2022-06-27 NOTE — Patient Outreach (Signed)
  Care Coordination   Collaboration  Visit Note   06/27/2022 Name: [name] [name] MRN: [id number] DOB: Sep 04, 1950  [name] [name] is a 72 y.o. year old female who sees [name], MD for primary care. I  spoke with Alinda Money social work with DSS.  He states he visited patient on yesterday.  There was some concern about patient and home status.  Advised him of this CM involvement telephonically and Mathews Humble's involvement as well telephonically. He states home is cluttered and thinks patient has capacity to stay in the home but needs help.  Patient refusing placement currently.  Discussed that patient has no resources to pay for help in the home and that patient has not filled out the [facility] application sent by [facility] social worker.  He states patient showed him the application but it has not been completed.  He thinks maybe with the new medicaid standards she may qualify for medicaid to possibly get help in the home.  He states that he will outreach to PCP office about someone in the office assistiing patient to complete medicaid application and go from there.    What matters to the patients health and wellness today?  N/A    Goals Addressed   None     SDOH assessments and interventions completed:  No     Care Coordination Interventions:  Yes, provided   Follow up plan: Follow up call scheduled for April    Encounter Outcome:  Pt. Visit Completed   Jone Baseman, RN, MSN [place] [facility] Care Management Coordinator Direct Line [phone]

## 2022-07-08 ENCOUNTER — Other Ambulatory Visit: Payer: Self-pay | Admitting: Cardiovascular Disease

## 2022-07-10 ENCOUNTER — Ambulatory Visit: Payer: Self-pay

## 2022-07-10 NOTE — Patient Outreach (Signed)
  Care Coordination   Follow Up Visit Note   07/10/2022 Name: [name] [name] MRN: [id number] DOB: [birth year]  [name] [name] is a 72 y.o. year old female who sees [name] Salisbury, MD for primary care. I spoke with  [name] [name] by phone today.  What matters to the patients health and wellness today?  Maintaining in her home    Goals Addressed             This Visit's Progress    Health Maintenance & Independence       Patient Goals/Self Care Activities: -Patient/Caregiver will self-administer medications as prescribed as evidenced by self-report/primary caregiver report  -Patient/Caregiver will attend all scheduled provider appointments as evidenced by clinician review of documented attendance to scheduled appointments and patient/caregiver report -Patient/Caregiver will call provider office for new concerns or questions as evidenced by review of documented incoming telephone call notes and patient report  -Keep your airway clear from mucus build up  -Control your cough by drinking plenty of water -Visit your doctor on a regular basis -Practice and use pursed lip breathing for shortness of breath recovery and prevention -Self assess COPD action plan zone and make appointment with provider if you have been in the yellow zone for 48 hours without improvement. Pace self with activities   Patient with history of falls. No falls since last call.  Discussed fall risk and prevention.  Discussed Medicaid application process patient completed but not mailed. Discussed applying online. Patient does have access via tablet. Patient to also complete food stamp application online as well.     Still working on [employer] application.  Patient reports no further visit from social work  with APS.    Patient reported that her neighbor helped her with her trash and that she will call her when trash needs to be taken out again.  [facility] does not provide such as service.   Interventions  Today    Flowsheet Row Most Recent Value  Chronic Disease   Chronic disease during today's visit Chronic Obstructive Pulmonary Disease (COPD), Other, Hypertension (HTN)  [Falls]  General Interventions   General Interventions Discussed/Reviewed [employer], General Interventions Discussed  Education Interventions   Education Provided Provided Education  Provided Verbal Education On Other  [employer].  Patient to apply for food stamps and medicaid on line via her tablet.  Patient given resources of [facility] and [facility] for medication delivery.]  Nutrition Interventions   Nutrition Discussed/Reviewed Nutrition Reviewed  Pharmacy Interventions   Pharmacy Dicussed/Reviewed Pharmacy Topics Discussed  [Discussed medication delivery.]              SDOH assessments and interventions completed:  Yes     Care Coordination Interventions:  Yes, provided   Follow up plan: Follow up call scheduled for May    Encounter Outcome:  Pt. Visit Completed   [name] Leriche, RN, MSN [facility] Care Management Care Management Coordinator Direct Line [phone]

## 2022-07-10 NOTE — Patient Instructions (Signed)
Visit Information  Thank you for taking time to visit with me today. Please don't hesitate to contact me if I can be of assistance to you.   Following are the goals we discussed today:   Goals Addressed             This Visit's Progress    Health Maintenance & Independence       Patient Goals/Self Care Activities: -Patient/Caregiver will self-administer medications as prescribed as evidenced by self-report/primary caregiver report  -Patient/Caregiver will attend all scheduled provider appointments as evidenced by clinician review of documented attendance to scheduled appointments and patient/caregiver report -Patient/Caregiver will call provider office for new concerns or questions as evidenced by review of documented incoming telephone call notes and patient report  -Keep your airway clear from mucus build up  -Control your cough by drinking plenty of water -Visit your doctor on a regular basis -Practice and use pursed lip breathing for shortness of breath recovery and prevention -Self assess COPD action plan zone and make appointment with provider if you have been in the yellow zone for 48 hours without improvement. Pace self with activities   Patient with history of falls. No falls since last call.  Discussed fall risk and prevention.  Discussed Medicaid application process patient completed but not mailed. Discussed applying online. Patient does have access via tablet. Patient to also complete food stamp application online as well.     Still working on [employer] application.  Patient reports no further visit from social work  with APS.    Patient reported that her neighbor helped her with her trash and that she will call her when trash needs to be taken out again.  [facility] does not provide such as service.   Interventions Today    Flowsheet Row Most Recent Value  Chronic Disease   Chronic disease during today's visit Chronic Obstructive Pulmonary Disease (COPD), Other,  Hypertension (HTN)  [Falls]  General Interventions   General Interventions Discussed/Reviewed [employer], General Interventions Discussed  Education Interventions   Education Provided Provided Education  Provided Verbal Education On Other  [employer].  Patient to apply for food stamps and medicaid on line via her tablet.  Patient given resources of [facility] and [facility] for medication delivery.]  Nutrition Interventions   Nutrition Discussed/Reviewed Nutrition Reviewed  Pharmacy Interventions   Pharmacy Dicussed/Reviewed Pharmacy Topics Discussed  [Discussed medication delivery.]              Our next appointment is by telephone on 08/07/22 at 145 pm  Please call the care guide team at [phone] if you need to cancel or reschedule your appointment.   If you are experiencing a Mental Health or Behavioral Health Crisis or need someone to talk to, please call the Suicide and Crisis Lifeline: 988   Patient verbalizes understanding of instructions and care plan provided today and agrees to view in MyChart. Active MyChart status and patient understanding of how to access instructions and care plan via MyChart confirmed with patient.     The patient has been provided with contact information for the care management team and has been advised to call with any health related questions or concerns.   [name], RN, MSN [facility] Care Management Care Management Coordinator Direct Line [phone]

## 2022-07-11 ENCOUNTER — Other Ambulatory Visit: Payer: Self-pay

## 2022-07-11 ENCOUNTER — Encounter (HOSPITAL_COMMUNITY): Payer: Self-pay

## 2022-07-11 ENCOUNTER — Emergency Department (HOSPITAL_COMMUNITY)
Admission: EM | Admit: 2022-07-11 | Discharge: 2022-07-12 | Disposition: A | Discharge status: Home / Self Care | Payer: Medicare Other | Attending: Emergency Medicine | Admitting: Emergency Medicine

## 2022-07-11 ENCOUNTER — Emergency Department (HOSPITAL_COMMUNITY): Payer: Medicare Other

## 2022-07-11 DIAGNOSIS — J449 Chronic obstructive pulmonary disease, unspecified: Secondary | ICD-10-CM | POA: Insufficient documentation

## 2022-07-11 DIAGNOSIS — G8929 Other chronic pain: Secondary | ICD-10-CM

## 2022-07-11 DIAGNOSIS — I10 Essential (primary) hypertension: Secondary | ICD-10-CM | POA: Diagnosis not present

## 2022-07-11 DIAGNOSIS — W19XXXA Unspecified fall, initial encounter: Secondary | ICD-10-CM | POA: Insufficient documentation

## 2022-07-11 DIAGNOSIS — R609 Edema, unspecified: Secondary | ICD-10-CM | POA: Diagnosis not present

## 2022-07-11 DIAGNOSIS — R531 Weakness: Secondary | ICD-10-CM | POA: Insufficient documentation

## 2022-07-11 DIAGNOSIS — R944 Abnormal results of kidney function studies: Secondary | ICD-10-CM | POA: Insufficient documentation

## 2022-07-11 DIAGNOSIS — R29818 Other symptoms and signs involving the nervous system: Secondary | ICD-10-CM | POA: Diagnosis not present

## 2022-07-11 DIAGNOSIS — Z743 Need for continuous supervision: Secondary | ICD-10-CM | POA: Diagnosis not present

## 2022-07-11 DIAGNOSIS — Y92009 Unspecified place in unspecified non-institutional (private) residence as the place of occurrence of the external cause: Secondary | ICD-10-CM | POA: Diagnosis not present

## 2022-07-11 DIAGNOSIS — S80919A Unspecified superficial injury of unspecified knee, initial encounter: Secondary | ICD-10-CM | POA: Diagnosis not present

## 2022-07-11 DIAGNOSIS — S8002XA Contusion of left knee, initial encounter: Secondary | ICD-10-CM | POA: Insufficient documentation

## 2022-07-11 DIAGNOSIS — Z7982 Long term (current) use of aspirin: Secondary | ICD-10-CM | POA: Insufficient documentation

## 2022-07-11 DIAGNOSIS — Z79899 Other long term (current) drug therapy: Secondary | ICD-10-CM | POA: Diagnosis not present

## 2022-07-11 DIAGNOSIS — F17200 Nicotine dependence, unspecified, uncomplicated: Secondary | ICD-10-CM | POA: Diagnosis not present

## 2022-07-11 DIAGNOSIS — S8992XA Unspecified injury of left lower leg, initial encounter: Secondary | ICD-10-CM | POA: Diagnosis present

## 2022-07-11 DIAGNOSIS — M25562 Pain in left knee: Secondary | ICD-10-CM | POA: Diagnosis not present

## 2022-07-11 DIAGNOSIS — R6889 Other general symptoms and signs: Secondary | ICD-10-CM | POA: Diagnosis not present

## 2022-07-11 DIAGNOSIS — R296 Repeated falls: Secondary | ICD-10-CM | POA: Diagnosis not present

## 2022-07-11 LAB — COMPREHENSIVE METABOLIC PANEL
ALT: 16 U/L (ref 0–44)
AST: 20 U/L (ref 15–41)
Albumin: 3.4 g/dL — ABNORMAL LOW (ref 3.5–5.0)
Alkaline Phosphatase: 54 U/L (ref 38–126)
Anion gap: 11 (ref 5–15)
BUN: 31 mg/dL — ABNORMAL HIGH (ref 8–23)
CO2: 26 mmol/L (ref 22–32)
Calcium: 9 mg/dL (ref 8.9–10.3)
Chloride: 102 mmol/L (ref 98–111)
Creatinine, Ser: 1.74 mg/dL — ABNORMAL HIGH (ref 0.44–1.00)
GFR, Estimated: 31 mL/min — ABNORMAL LOW (ref >60)
Glucose, Bld: 117 mg/dL — ABNORMAL HIGH (ref 70–99)
Potassium: 4.4 mmol/L (ref 3.5–5.1)
Sodium: 139 mmol/L (ref 135–145)
Total Bilirubin: 0.3 mg/dL (ref 0.3–1.2)
Total Protein: 6.2 g/dL — ABNORMAL LOW (ref 6.5–8.1)

## 2022-07-11 LAB — CBC WITH DIFFERENTIAL/PLATELET
Abs Immature Granulocytes: 0.05 10*3/uL (ref 0.00–0.07)
Basophils Absolute: 0.1 10*3/uL (ref 0.0–0.1)
Basophils Relative: 1 %
Eosinophils Absolute: 0 10*3/uL (ref 0.0–0.5)
Eosinophils Relative: 0 %
HCT: 45.4 % (ref 36.0–46.0)
Hemoglobin: 14.7 g/dL (ref 12.0–15.0)
Immature Granulocytes: 1 %
Lymphocytes Relative: 21 %
Lymphs Abs: 1.9 10*3/uL (ref 0.7–4.0)
MCH: 31.8 pg (ref 26.0–34.0)
MCHC: 32.4 g/dL (ref 30.0–36.0)
MCV: 98.3 fL (ref 80.0–100.0)
Monocytes Absolute: 0.7 10*3/uL (ref 0.1–1.0)
Monocytes Relative: 8 %
Neutro Abs: 6.1 10*3/uL (ref 1.7–7.7)
Neutrophils Relative %: 69 %
Platelets: 295 10*3/uL (ref 150–400)
RBC: 4.62 MIL/uL (ref 3.87–5.11)
RDW: 13 % (ref 11.5–15.5)
WBC: 8.8 10*3/uL (ref 4.0–10.5)
nRBC: 0 % (ref 0.0–0.2)

## 2022-07-11 LAB — AMMONIA: Ammonia: 18 umol/L (ref 9–35)

## 2022-07-11 NOTE — ED Provider Notes (Incomplete)
[place] EMERGENCY DEPARTMENT AT [facility] Provider Note   CSN: [id number] Arrival date & time: 07/11/22  1955     History {Add pertinent medical, surgical, social history, OB history to HPI:1} Chief Complaint  Patient presents with  . Knee Pain  . Fall    [name] [name] is a 72 y.o. female.  Patient is a 72 year old female with a history of hypertension, hyperlipidemia, COPD, tobacco abuse, rheumatoid arthritis, and depression who is presenting today with complaints of worsening weakness and recurrent falls.  Patient reports for the last 4 months she has had worsening issues with her balance.  She reports that she typically will fall to the left her back.  Every time she falls she is unable to get up and she has to call the ambulance.  However today she has fallen twice.  She reports the falls consisted of she was walking from her kitchen to her living room and she started feeling weak and no she needed to sit down.  She was trying to get around the couch but did not make it and sat down in the floor.  However there have been times where she has fallen on her left knee.  She did follow-up with her doctor about the pain in her knee after the fall and even saw orthopedist but has not been worked up for why she is falling.  She denies feeling dizzy or disoriented prior to the fall.  She reports she just feels weak and that she is going to go down and reports she cannot stop it.  She has not had unilateral weakness on one side.  She denies any headaches.  She is eating and drinking normally and denies any recent medication changes.  She has not had any vision changes.  She denies any alcohol or drug use.  She does not use any assist device to get around.  The history is provided by the patient.  Knee Pain Fall       Home Medications Prior to Admission medications   Medication Sig Start Date End Date Taking? Authorizing Provider  albuterol (PROAIR HFA) 108 (90 Base) MCG/ACT  inhaler Inhale 2 puffs into the lungs every 6 (six) hours as needed for wheezing. 02/14/21   [name], MD  ALPRAZolam Prudy Feeler) 0.25 MG tablet TAKE 3 TABLETS BY MOUTH EVERY 6 HOURS AS NEEDED 12/22/21   [name], MD  aspirin EC 81 MG tablet Take 1 tablet (81 mg total) by mouth daily. 01/18/15   Kathleene Hazel, MD  atorvastatin (LIPITOR) 20 MG tablet TAKE 1 TABLET BY MOUTH EVERY DAY 05/05/22   [name], MD  buPROPion (WELLBUTRIN XL) 300 MG 24 hr tablet TAKE 1 TABLET BY MOUTH EVERY DAY 04/04/22   [name], MD  cetirizine (ZYRTEC) 10 MG tablet Take 10 mg by mouth daily as needed for allergies.     [provider]  cholecalciferol (VITAMIN D3) 25 MCG (1000 UNIT) tablet Take 1,000 Units by mouth daily.    [provider]  donepezil (ARICEPT) 5 MG tablet Take 1 tablet (5 mg total) by mouth at bedtime. Patient not taking: Reported on 06/05/2022 04/24/22   [name], MD  flecainide [facility]) 50 MG tablet  01/20/20   [provider]  lisinopril (ZESTRIL) 10 MG tablet Take 10 mg by mouth daily. 07/11/19   [provider]  Magnesium 400 MG CAPS Take 400 mg by mouth daily. 07/08/21   Verne Carrow  D, MD  metoprolol tartrate (LOPRESSOR) 100 MG tablet TAKE 1 TABLET BY MOUTH TWICE A DAY 07/10/22   Kathleene Hazel, MD  nitroGLYCERIN (NITROSTAT) 0.4 MG SL tablet Place 1 tablet (0.4 mg total) under the tongue every 5 (five) minutes as needed for chest pain. 04/05/20   Kathleene Hazel, MD  pantoprazole (PROTONIX) 40 MG tablet TAKE 1 TABLET BY MOUTH TWICE A DAY 05/05/22   [name], MD  sertraline (ZOLOFT) 100 MG tablet TAKE 1 TABLET BY MOUTH EVERY DAY 04/04/22   [name], MD      Allergies    Augmentin [amoxicillin-pot clavulanate], Clindamycin/lincomycin, Colchicine, Flecainide, and Hydroxychloroquine    Review of Systems   Review of Systems  Physical Exam Updated Vital Signs BP 138/87 (BP Location: Left Arm)   Pulse 61    Temp 99 F (37.2 C) (Oral)   Resp 17   Ht 5\' 2"  (1.575 m)   Wt 85.3 kg   SpO2 95%   BMI 34.39 kg/m  Physical Exam Vitals and nursing note reviewed.  Constitutional:      General: She is not in acute distress.    Appearance: She is well-developed.  HENT:     Head: Normocephalic and atraumatic.  Eyes:     Pupils: Pupils are equal, round, and reactive to light.  Cardiovascular:     Rate and Rhythm: Normal rate and regular rhythm.     Heart sounds: Normal heart sounds. No murmur heard.    No friction rub.  Pulmonary:     Effort: Pulmonary effort is normal.     Breath sounds: Normal breath sounds. No wheezing or rales.  Abdominal:     General: Bowel sounds are normal. There is no distension.     Palpations: Abdomen is soft.     Tenderness: There is no abdominal tenderness. There is no guarding or rebound.  Musculoskeletal:        General: Tenderness present. Normal range of motion.     Left knee: Swelling, ecchymosis and bony tenderness present.     Comments: No edema  Skin:    General: Skin is warm and dry.     Findings: No rash.  Neurological:     Mental Status: She is alert and oriented to person, place, and time.     Cranial Nerves: No cranial nerve deficit.     Comments: Facial droop noted on the left which patient reports is chronic but unknown.  No pronator drift in the upper or lower extremities.  4 out of 5 handgrip on the left compared to the right.  Normal heel-to-shin bilaterally.  Normal finger-to-nose.  Psychiatric:        Mood and Affect: Mood normal.        Behavior: Behavior normal.     ED Results / Procedures / Treatments   Labs (all labs ordered are listed, but only abnormal results are displayed) Labs Reviewed  COMPREHENSIVE METABOLIC PANEL - Abnormal; Notable for the following components:      Result Value   Glucose, Bld 117 (*)    BUN 31 (*)    Creatinine, Ser 1.74 (*)    Total Protein 6.2 (*)    Albumin 3.4 (*)    GFR, Estimated 31 (*)     All other components within normal limits  CBC WITH DIFFERENTIAL/PLATELET  AMMONIA    EKG None  Radiology DG Knee Complete 4 Views Left  Result Date: 07/11/2022 CLINICAL DATA:  Multiple falls. EXAM: LEFT  KNEE - COMPLETE 4+ VIEW COMPARISON:  May 17, 2022. FINDINGS: No evidence of fracture, dislocation, or joint effusion. No evidence of arthropathy or other focal bone abnormality. Soft tissues are unremarkable. IMPRESSION: Negative. Electronically Signed   By: Lupita RaiderJames  Green Jr M.D.   On: 07/11/2022 21:05    Procedures Procedures  {Document cardiac monitor, telemetry assessment procedure when appropriate:1}  Medications Ordered in ED Medications - No data to display  ED Course/ Medical Decision Making/ A&P   {   Click here for ABCD2, HEART and other calculatorsREFRESH Note before signing :1}                          Medical Decision Making Amount and/or Complexity of Data Reviewed Labs: ordered. Radiology: ordered.   ***  {Document critical care time when appropriate:1} {Document review of labs and clinical decision tools ie heart score, Chads2Vasc2 etc:1}  {Document your independent review of radiology images, and any outside records:1} {Document your discussion with family members, caretakers, and with consultants:1} {Document social determinants of health affecting pt's care:1} {Document your decision making why or why not admission, treatments were needed:1} Final Clinical Impression(s) / ED Diagnoses Final diagnoses:  None    Rx / DC Orders ED Discharge Orders     None

## 2022-07-11 NOTE — ED Triage Notes (Addendum)
Patient BIB [facility] from home with complaint of fall x 2 today from seated position in attempt to stand.   Patient denies LOC, denies hitting head, denies blood thinners.   A & O x3.  Patient reports multiple falls in last 4 months.

## 2022-07-11 NOTE — ED Provider Notes (Signed)
[place] EMERGENCY DEPARTMENT AT [facility] Provider Note   CSN: [id number] Arrival date & time: 07/11/22  1955     History  Chief Complaint  Patient presents with   Knee Pain   Fall    [name] [name] is a 72 y.o. female.  Patient is a 72 year old female with a history of hypertension, hyperlipidemia, COPD, tobacco abuse, rheumatoid arthritis, and depression who is presenting today with complaints of worsening weakness and recurrent falls.  Patient reports for the last 4 months she has had worsening issues with her balance.  She reports that she typically will fall to the left her back.  Every time she falls she is unable to get up and she has to call the ambulance.  However today she has fallen twice.  She reports the falls consisted of she was walking from her kitchen to her living room and she started feeling weak and no she needed to sit down.  She was trying to get around the couch but did not make it and sat down in the floor.  However there have been times where she has fallen on her left knee.  She did follow-up with her doctor about the pain in her knee after the fall and even saw orthopedist but has not been worked up for why she is falling.  She denies feeling dizzy or disoriented prior to the fall.  She reports she just feels weak and that she is going to go down and reports she cannot stop it.  She has not had unilateral weakness on one side.  She denies any headaches.  She is eating and drinking normally and denies any recent medication changes.  She has not had any vision changes.  She denies any alcohol or drug use.  She does not use any assist device to get around.  The history is provided by the patient.  Knee Pain Fall       Home Medications Prior to Admission medications   Medication Sig Start Date End Date Taking? Authorizing Provider  albuterol (PROAIR HFA) 108 (90 Base) MCG/ACT inhaler Inhale 2 puffs into the lungs every 6 (six) hours as needed for  wheezing. 02/14/21   [name], MD  ALPRAZolam Prudy Feeler) 0.25 MG tablet TAKE 3 TABLETS BY MOUTH EVERY 6 HOURS AS NEEDED 12/22/21   [name], MD  aspirin EC 81 MG tablet Take 1 tablet (81 mg total) by mouth daily. 01/18/15   Kathleene Hazel, MD  atorvastatin (LIPITOR) 20 MG tablet TAKE 1 TABLET BY MOUTH EVERY DAY 05/05/22   [name], MD  buPROPion (WELLBUTRIN XL) 300 MG 24 hr tablet TAKE 1 TABLET BY MOUTH EVERY DAY 04/04/22   [name], MD  cetirizine (ZYRTEC) 10 MG tablet Take 10 mg by mouth daily as needed for allergies.     [provider]  cholecalciferol (VITAMIN D3) 25 MCG (1000 UNIT) tablet Take 1,000 Units by mouth daily.    [provider]  donepezil (ARICEPT) 5 MG tablet Take 1 tablet (5 mg total) by mouth at bedtime. Patient not taking: Reported on 06/05/2022 04/24/22   [name], MD  flecainide [facility]) 50 MG tablet  01/20/20   [provider]  lisinopril (ZESTRIL) 10 MG tablet Take 10 mg by mouth daily. 07/11/19   [provider]  Magnesium 400 MG CAPS Take 400 mg by mouth daily. 07/08/21   Kathleene Hazel, MD  metoprolol tartrate (LOPRESSOR) 100 MG tablet  TAKE 1 TABLET BY MOUTH TWICE A DAY 07/10/22   Kathleene HazelMcAlhany, Christopher D, MD  nitroGLYCERIN (NITROSTAT) 0.4 MG SL tablet Place 1 tablet (0.4 mg total) under the tongue every 5 (five) minutes as needed for chest pain. 04/05/20   Kathleene HazelMcAlhany, Christopher D, MD  pantoprazole (PROTONIX) 40 MG tablet TAKE 1 TABLET BY MOUTH TWICE A DAY 05/05/22   Nelwyn SalisburyFry, Stephen A, MD  sertraline (ZOLOFT) 100 MG tablet TAKE 1 TABLET BY MOUTH EVERY DAY 04/04/22   Nelwyn SalisburyFry, Stephen A, MD      Allergies    Augmentin [amoxicillin-pot clavulanate], Clindamycin/lincomycin, Colchicine, Flecainide, and Hydroxychloroquine    Review of Systems   Review of Systems  Physical Exam Updated Vital Signs BP 138/87 (BP Location: Left Arm)   Pulse 61   Temp 99 F (37.2 C) (Oral)   Resp 17   Ht 5\' 2"  (1.575 m)   Wt  85.3 kg   SpO2 95%   BMI 34.39 kg/m  Physical Exam Vitals and nursing note reviewed.  Constitutional:      General: She is not in acute distress.    Appearance: She is well-developed.  HENT:     Head: Normocephalic and atraumatic.  Eyes:     Pupils: Pupils are equal, round, and reactive to light.  Cardiovascular:     Rate and Rhythm: Normal rate and regular rhythm.     Heart sounds: Normal heart sounds. No murmur heard.    No friction rub.  Pulmonary:     Effort: Pulmonary effort is normal.     Breath sounds: Normal breath sounds. No wheezing or rales.  Abdominal:     General: Bowel sounds are normal. There is no distension.     Palpations: Abdomen is soft.     Tenderness: There is no abdominal tenderness. There is no guarding or rebound.  Musculoskeletal:        General: Tenderness present. Normal range of motion.     Left knee: Swelling, ecchymosis and bony tenderness present.     Comments: No edema  Skin:    General: Skin is warm and dry.     Findings: No rash.  Neurological:     Mental Status: She is alert and oriented to person, place, and time.     Cranial Nerves: No cranial nerve deficit.     Comments: Facial droop noted on the left which patient reports is chronic but unknown.  No pronator drift in the upper or lower extremities.  4 out of 5 handgrip on the left compared to the right.  Normal heel-to-shin bilaterally.  Normal finger-to-nose.  Psychiatric:        Mood and Affect: Mood normal.        Behavior: Behavior normal.     ED Results / Procedures / Treatments   Labs (all labs ordered are listed, but only abnormal results are displayed) Labs Reviewed  COMPREHENSIVE METABOLIC PANEL - Abnormal; Notable for the following components:      Result Value   Glucose, Bld 117 (*)    BUN 31 (*)    Creatinine, Ser 1.74 (*)    Total Protein 6.2 (*)    Albumin 3.4 (*)    GFR, Estimated 31 (*)    All other components within normal limits  CBC WITH  DIFFERENTIAL/PLATELET  AMMONIA    EKG None  Radiology DG Knee Complete 4 Views Left  Result Date: 07/11/2022 CLINICAL DATA:  Multiple falls. EXAM: LEFT KNEE - COMPLETE 4+ VIEW COMPARISON:  May 17, 2022. FINDINGS: No evidence of fracture, dislocation, or joint effusion. No evidence of arthropathy or other focal bone abnormality. Soft tissues are unremarkable. IMPRESSION: Negative. Electronically Signed   By: Lupita Raider M.D.   On: 07/11/2022 21:05    Procedures Procedures    Medications Ordered in ED Medications - No data to display  ED Course/ Medical Decision Making/ A&P                             Medical Decision Making Amount and/or Complexity of Data Reviewed Labs: ordered. Radiology: ordered.   Pt with multiple medical problems and comorbidities and presenting today with a complaint that caries a high risk for morbidity and mortality.  Here today with multiple recurrent falls inability to get up and having called the ambulance.  Patient takes no anticoagulation denies hitting her head or loss of consciousness.  Symptoms do not seem to be related to a cardiovascular process and low concern for near syncope, PE, dysrhythmia.  Concern for possible stroke as the cause of her loss of balance and falls versus electrolyte abnormality versus neuropathy and proprioceptive problems versus brain mass.  Lower suspicion for infection at this time.  Patient is well-appearing on exam and in no acute distress.  I independently interpreted patient's labs.  CBC and ammonia levels are within normal limits, CMP does show creatinine of 1.74 today but last 1 checked was in 2022 and at that time it was 1.5 head CT is pending.  I have independently visualized and interpreted pt's images today. Knee imaging is negative today.  If head CT is negative patient would most likely benefit from physical therapy.  She also most likely needs an assist device to get around but sounds like her house does not  have a lot of space conducive to a walker.         Final Clinical Impression(s) / ED Diagnoses Final diagnoses:  None    Rx / DC Orders ED Discharge Orders     None         Gwyneth Sprout, MD 07/12/22 0006

## 2022-07-12 ENCOUNTER — Emergency Department (HOSPITAL_COMMUNITY): Payer: Medicare Other

## 2022-07-12 DIAGNOSIS — R29818 Other symptoms and signs involving the nervous system: Secondary | ICD-10-CM | POA: Diagnosis not present

## 2022-07-12 NOTE — ED Provider Notes (Signed)
I assumed care at signout to follow-up on CT imaging.  CT imaging reviewed and is negative EKG performed and is unremarkable  EKG Interpretation  Date/Time:  Wednesday July 12 2022 02:08:03 EDT Ventricular Rate:  65 PR Interval:  256 QRS Duration: 94 QT Interval:  447 QTC Calculation: 465 R Axis:   3 Text Interpretation: Sinus or ectopic atrial rhythm Prolonged PR interval Abnormal R-wave progression, early transition Interpretation limited secondary to artifact Confirmed by Zadie Rhine ([id number]) on 07/12/2022 2:43:44 AM        I personally ambulated the patient.  She is able to walk a slow steady gait with minimal assistance.  No ataxia.  Patient reports this has been ongoing for months.  Will place ambulatory referral to neurology  Patient reports her townhome is getting difficult to live in. She reports she is having difficulty get up the stairs and does fall frequently.  However this is been ongoing for months.  She is not interested in nursing home or assisted living.  She is interested in home health and perhaps a nurses aide. Home health orders have been placed.    Zadie Rhine, MD 07/12/22 [phone]

## 2022-07-13 ENCOUNTER — Ambulatory Visit: Payer: Self-pay

## 2022-07-13 NOTE — Patient Outreach (Signed)
  Care Coordination   Follow Up Visit Note   07/13/2022 Name: Azaleia Arrellano MRN: [id number] DOB: 1950/09/05  Rosaelia Lilienthal Carucci is a 72 y.o. year old female who sees [name], MD for primary care. I spoke with  [name] by phone today.  What matters to the patients health and wellness today?  Patient reports her left leg continues to swell and be painful. She is concerned with ongoing falls in the home.    Goals Addressed             This Visit's Progress    Care Coordination Activities       Care Coordination Interventions: Spoke with the patient who indicates she went to the ED on 4/9 due to experiencing two falls that day. Patient states her left leg and knee continue to be swollen and painful. Patient also reports it is red in color Discussed patient needs to return to her orthopedic provider for follow up however patient has an outstanding balance and would prefer to have a shower before leaving the home. Patient has not showered since her ED visit due to fear of falling Performed chart review to note ED provider planned to order home health, SW unable to determine which agency orders were sent to Collaboration with Fleeta Emmer RN Care Manager to provide an update on patients current disposition and request assistance with chart review regarding home health orders Reviewed patient continues to have difficulty affording home, however she feels she cannot sell her home considering her partner is a Geographical information systems officer and they are no longer speaking Obtained verbal permission for SW to place a referral to the [facility] Hotline which offers a free 15 minute consult with an attorney Discussed plans for SW to outreach colleagues to determine if they are aware of any other options for the patient. At this time she does not want placement as she wishes to remain in the home as long as possible        SDOH assessments and interventions completed:  No      Care Coordination Interventions:  Yes, provided   Interventions Today    Flowsheet Row Most Recent Value  Chronic Disease   Chronic disease during today's visit Chronic Obstructive Pulmonary Disease (COPD), Hypertension (HTN), Other  [Falls]  General Interventions   General Interventions Discussed/Reviewed General Interventions Reviewed, [employer], Communication with  Communication with RN        Follow up plan:  SW will continue to follow    Encounter Outcome:  Pt. Visit Completed   Bevelyn Ngo, Kenard Gower, CDP Social Worker, Certified Dementia Practitioner [facility] Care Management  Care Coordination [phone]

## 2022-07-13 NOTE — Patient Instructions (Signed)
Visit Information  Thank you for taking time to visit with me today. Please don't hesitate to contact me if I can be of assistance to you.   Following are the goals we discussed today:  -Engage with [employer] for an attorney consult -Contact your provider as needed -Engage with home health services when initiated   If you are experiencing a Mental Health or Behavioral Health Crisis or need someone to talk to, please call 911  Patient verbalizes understanding of instructions and care plan provided today and agrees to view in MyChart. Active MyChart status and patient understanding of how to access instructions and care plan via MyChart confirmed with patient.     Bevelyn Ngo, BSW, CDP Social Worker, Certified Dementia Practitioner [facility] Care Management  Care Coordination [phone]

## 2022-07-14 ENCOUNTER — Telehealth: Payer: Self-pay

## 2022-07-14 DIAGNOSIS — I10 Essential (primary) hypertension: Secondary | ICD-10-CM

## 2022-07-14 NOTE — Patient Outreach (Signed)
  Care Coordination   07/14/2022 Name: [name] [name] MRN: [id number] DOB: [birth year]   Care Coordination Outreach Attempts:  An unsuccessful telephone outreach was attempted today to offer the patient information about available care coordination services as a benefit of their health plan.   Follow Up Plan:  Additional outreach attempts will be made to offer the patient care coordination information and services.   Encounter Outcome:  No Answer   Care Coordination Interventions:  No, not indicated    [name], RN, MSN [facility] Care Management Care Management Coordinator Direct Line [phone]

## 2022-07-14 NOTE — Patient Outreach (Addendum)
  Care Coordination   Follow Up Visit Note   07/14/2022 Name: [name] [name] MRN: [id number] DOB: 01-07-1951  [name] [name] is a 72 y.o. year old female who sees [name], MD for primary care. I spoke with  [name] [name] by phone today.  What matters to the patients health and wellness today?  Staying in her home    Goals Addressed             This Visit's Progress    Health Maintenance & Independence       Patient Goals/Self Care Activities: -Patient/Caregiver will self-administer medications as prescribed as evidenced by self-report/primary caregiver report  -Patient/Caregiver will attend all scheduled provider appointments as evidenced by clinician review of documented attendance to scheduled appointments and patient/caregiver report -Patient/Caregiver will call provider office for new concerns or questions as evidenced by review of documented incoming telephone call notes and patient report  -Keep your airway clear from mucus build up  -Control your cough by drinking plenty of water -Visit your doctor on a regular basis -Practice and use pursed lip breathing for shortness of breath recovery and prevention -Self assess COPD action plan zone and make appointment with provider if you have been in the yellow zone for 48 hours without improvement. Pace self with activities   Patient with history of falls. Two falls this week with ED visit. Discussed fall risk and prevention.  RN CM expressed concern about her safety in her home. Patient declines going to a higher level of care.  Patient set up for follow up with PCP 07/21/22.  Referral to transportation completed.   Message to PCP concerning referral for Home Health for Evaluation. UPDATE: Physician to order home health once he sees patient face to face next week.  Discussed Medicaid application.  Patient still has not completed. Encouraged to complete.      Patient still working on [employer] application.        Interventions Today    Flowsheet Row Most Recent Value  Chronic Disease   Chronic disease during today's visit Chronic Obstructive Pulmonary Disease (COPD), Hypertension (HTN)  General Interventions   General Interventions Discussed/Reviewed Communication with, General Interventions Reviewed, Risk analyst with Social Work, PCP/Specialists  Education Interventions   Education Provided Provided Education  Provided International aid/development worker Interventions   Pharmacy Dicussed/Reviewed Pharmacy Topics Reviewed  Safety Interventions   Safety Discussed/Reviewed Fall Risk, Safety Discussed  Home Educational psychologist provider for referral to PT/OT              SDOH assessments and interventions completed:  Yes     Care Coordination Interventions:  Yes, provided   Follow up plan: Follow up call scheduled for 08/07/22    Encounter Outcome:  Pt. Visit Completed   [name], RN, MSN [facility] Care Management Care Management Coordinator Direct Line [phone]

## 2022-07-14 NOTE — Transitions of Care (Post Inpatient/ED Visit) (Signed)
   07/14/2022  Name: [name] [name] MRN: [id number] DOB: June 09, 1950  Today's TOC FU Call Status: Today's TOC FU Call Status:: Unsuccessul Call (1st Attempt) Unsuccessful Call (1st Attempt) Date: 07/14/22 Adele Schilder on EMMI-ED Discharge Alert Date & Reason: 07/13/22 "Scheduled follow-up appt? No")  Attempted to reach the patient regarding the most recent Inpatient/ED visit.  Follow Up Plan: Additional outreach attempts will be made to reach the patient to complete the Transitions of Care (Post Inpatient/ED visit) call.   Antionette Fairy, RN,BSN,CCM [facility] Care Management Care Management Community Coordinator Direct Phone: [phone] Toll Free: [phone] Fax: [phone]

## 2022-07-14 NOTE — Patient Outreach (Signed)
  Care Coordination   Case Collaboration  Visit Note   07/14/2022 Name: Valetta Kingsland MRN: [id number] DOB: Aug 10, 1950  Promize Arevalos Haines is a 72 y.o. year old female who sees [name], MD for primary care. I  performed chart review to note patients referral to [facility] was accepted for legal consult. I collaborated with Fleeta Emmer, RN Care Manager to discuss interventions and plan for the patient. RN Care Manager will contact the patient today to review clinical needs.   SDOH assessments and interventions completed:  No     Care Coordination Interventions:  Yes, provided   Interventions Today    Flowsheet Row Most Recent Value  Chronic Disease   Chronic disease during today's visit Chronic Obstructive Pulmonary Disease (COPD), Hypertension (HTN)  General Interventions   General Interventions Discussed/Reviewed Communication with, Risk analyst with RN        Follow up plan:  SW will continue to follow.    Encounter Outcome:  Pt. Visit Completed   Bevelyn Ngo, BSW, CDP Social Worker, Certified Dementia Practitioner [facility] Care Management  Care Coordination [phone]

## 2022-07-14 NOTE — Telephone Encounter (Signed)
   Telephone encounter was:  Unsuccessful.  07/14/2022 Name: Raneka Hamburg MRN: [id number] DOB: Mar 15, 1951  Unsuccessful outbound call made today to assist with:  Transportation Needs   Outreach Attempt:  1st Attempt  A HIPAA compliant voice message was left requesting a return call.  Instructed patient to call back at [phone].  Kentley Cedillo [facility]  [facility] Population Health Community Resource Care Guide   [email or web address]  ?? [phone]   Website: [email or web address]  [email or web address]

## 2022-07-14 NOTE — Patient Instructions (Signed)
Visit Information  Thank you for taking time to visit with me today. Please don't hesitate to contact me if I can be of assistance to you.   Following are the goals we discussed today:   Goals Addressed             This Visit's Progress    Health Maintenance & Independence       Patient Goals/Self Care Activities: -Patient/Caregiver will self-administer medications as prescribed as evidenced by self-report/primary caregiver report  -Patient/Caregiver will attend all scheduled provider appointments as evidenced by clinician review of documented attendance to scheduled appointments and patient/caregiver report -Patient/Caregiver will call provider office for new concerns or questions as evidenced by review of documented incoming telephone call notes and patient report  -Keep your airway clear from mucus build up  -Control your cough by drinking plenty of water -Visit your doctor on a regular basis -Practice and use pursed lip breathing for shortness of breath recovery and prevention -Self assess COPD action plan zone and make appointment with provider if you have been in the yellow zone for 48 hours without improvement. Pace self with activities   Patient with history of falls. Two falls this week with ED visit. Discussed fall risk and prevention.  RN CM expressed concern about her safety in her home. Patient declines going to a higher level of care.  Patient set up for follow up with PCP 07/21/22.  Referral to transportation completed.   Message to PCP concerning referral for Home Health for Evaluation.  Discussed Medicaid application.  Patient still has not completed. Encouraged to complete.      Patient still working on [employer] application.       Interventions Today    Flowsheet Row Most Recent Value  Chronic Disease   Chronic disease during today's visit Chronic Obstructive Pulmonary Disease (COPD), Hypertension (HTN)  General Interventions   General Interventions  Discussed/Reviewed Communication with, General Interventions Reviewed, Risk analyst with Social Work, PCP/Specialists  Education Interventions   Education Provided Provided Education  Provided International aid/development worker Interventions   Pharmacy Dicussed/Reviewed Pharmacy Topics Reviewed  Safety Interventions   Safety Discussed/Reviewed Fall Risk, Safety Discussed  Home Safety Contact provider for referral to PT/OT              Our next appointment is by telephone on 08/07/22 at 145 pm  Please call the care guide team at [phone] if you need to cancel or reschedule your appointment.   If you are experiencing a Mental Health or Behavioral Health Crisis or need someone to talk to, please call the Suicide and Crisis Lifeline: 988   Patient verbalizes understanding of instructions and care plan provided today and agrees to view in MyChart. Active MyChart status and patient understanding of how to access instructions and care plan via MyChart confirmed with patient.     The patient has been provided with contact information for the care management team and has been advised to call with any health related questions or concerns.   [name], RN, MSN [facility] Care Management Care Management Coordinator Direct Line [phone]

## 2022-07-14 NOTE — Addendum Note (Signed)
Addended by: [name] on: 07/14/2022 02:01 PM   Modules accepted: Orders

## 2022-07-17 ENCOUNTER — Telehealth: Payer: Self-pay

## 2022-07-17 NOTE — Telephone Encounter (Signed)
     Patient  visit on 4/10  at [facility]  Have you been able to follow up with your primary care physician? Yes   The patient was or was not able to obtain any needed medicine or equipment. Yes  Are there diet recommendations that you are having difficulty following? Na   Patient expresses understanding of discharge instructions and education provided has no other needs at this time.  Yes      Lenard Forth [facility] Guide, [facility] [facility] [phone] [address] [place], [place], [place] [address] Phone: [phone] Email: [email or web address]

## 2022-07-17 NOTE — Telephone Encounter (Signed)
   Telephone encounter was:  Successful.  07/17/2022 Name: [name] [name] MRN: [id number] DOB: [birth year]  [name] [name] is a 72 y.o. year old female who is a primary care patient of [name], MD . The community resource team was consulted for assistance with Transportation Needs   Care guide performed the following interventions:  Great news! [facility] accepted ride [id number] for [name] [name] on 07/21/2022.  There is no further action required at this time. If you have any questions, please call the [facility] support team at [phone] and select Option 1 OR email [email or web address]. Have a great day! [facility] Health Support Team Ride Details: Roundtrip A-Leg Ride ID: [id number] Passenger Name: [name] [name] [facility] Passenger Phone: [phone] Ride Date: 07/21/2022 Daryll Drown Time: 01:35 PM (EDT) Pickup Address: [address] [place], [place] [address] Drop-off Address: [address] [place], [place], [place], [place] Transportation Type: Chiropodist (not [place] or [employer]) Driver Instructions: drop off: [employer] at [place], [phone] B-Leg Ride ID: [id number] Daryll Drown Time: 11:59 PM Pickup Address: [address] [place], [place], [place], [place] Address: [address] [place], [place] [address] Transportation Type: Standard Vehicle (not [place] or [place]) .  Follow Up Plan:  No further follow up planned at this time. The patient has been provided with needed resources.  Arlone Lenhardt [facility]  [facility] Population Health Community Resource Care Guide   [email or web address]  ?? [phone]   Website: [email or web address]  [email or web address]

## 2022-07-17 NOTE — Telephone Encounter (Signed)
   Telephone encounter was:  Successful.  07/17/2022 Name: [name] [name] MRN: [id number] DOB: Feb 09, 1951  [name] [name] is a 72 y.o. year old female who is a primary care patient of [name] Salisbury, MD . The community resource team was consulted for assistance with Transportation Needs   Care guide performed the following interventions: Spoke with patient to arrange transportation for 07/21/22 appointment.  Follow Up Plan:   I will contact patient once I receive confirmation from [employer]. Also verified patient's address to mail [employer] transportation application.  07/17/2022  [name] [name] DOB: [birth year] MRN: [id number]   RIDER WAIVER AND RELEASE OF LIABILITY  For the purposes of helping with transportation needs, [facility] partners with outside transportation providers (taxi companies, [place], Catering manager.) to give [name] patients or other approved people the choice of on-demand rides [employer]") to our buildings for non-emergency visits.  By using [employer], I, the person signing this document, on behalf of myself and/or any legal minors (in my care using the [employer]), agree:  Science writer given to me are supplied by independent, outside transportation providers who do not work for, or have any affiliation with, [name]. [place] is not a transportation company. [place] has no control over the quality or safety of the rides I get using [employer]. [place] has no control over whether any outside ride will happen on time or not. [place] gives no guarantee on the reliability, quality, safety, or availability on any rides, or that no mistakes will happen. I know and accept that traveling by vehicle (car, truck, SVU, Zenaida Niece, bus, taxi, etc.) has risks of serious injuries such as disability, being paralyzed, and death. I know and agree the risk of using [employer] is mine alone, and not [employer]. Transport Services  are provided "as is" and as are available. The transportation providers are in charge for all inspections and care of the vehicles used to provide these rides. I agree not to take legal action against [place], its agents, employees, officers, directors, representatives, insurers, attorneys, assigns, successors, subsidiaries, and affiliates at any time for any reasons related directly or indirectly to using [employer]. I also agree not to take legal action against [place] or its affiliates for any injury, death, or damage to property caused by or related to using [employer]. I have read this Waiver and Release of Liability, and I understand the terms used in it and their legal meaning. This Waiver is freely and voluntarily given with the understanding that my right (or any legal minors) to legal action against [place] relating to [employer] is knowingly given up to use these services.   I attest that I read the Ride Waiver and Release of Liability to [name] [name], gave Ms. Senna the opportunity to ask questions and answered the questions asked (if any). I affirm that [name] [name] then provided consent for assistance with transportation.     Carrel Leather D Addison Freimuth Shakara Tweedy Steele  [facility] Population Health Community Resource Care Guide   [email or web address]  ?? [phone]   Website: [email or web address]  [email or web address]

## 2022-07-19 NOTE — Patient Outreach (Signed)
  Care Coordination     07/19/2022 Name: Wenda Enriques MRN: [id number] DOB: 1950-05-02  Jadah Gabbard Assi is a 72 y.o. year old female who sees [name], MD for primary care. I  received an inbound call from Maple Hudson with [facility] [employer] who advised me of a report made on 3/24 with concerns of self-neglect and increased number of falls. Mr. Sharene Butters asked this SW if patient has capacity to make her own healthcare decisions. I advised Mr. Ufot that decision would have to come from her medical provider. Mr. Sharene Butters advised her has requested records and will await them for review.   SDOH assessments and interventions completed:  No     Care Coordination Interventions:  Yes, provided   Interventions Today    Flowsheet Row Most Recent Value  Chronic Disease   Chronic disease during today's visit Chronic Obstructive Pulmonary Disease (COPD), Hypertension (HTN)  General Interventions   General Interventions Discussed/Reviewed Communication with  Communication with --  [facility]]        Follow up plan:  SW will continue to follow patient for care coordination needs.    Encounter Outcome:  Pt. Visit Completed   Bevelyn Ngo, BSW, CDP Social Worker, Certified Dementia Practitioner [facility] Care Management  Care Coordination [phone]

## 2022-07-21 ENCOUNTER — Inpatient Hospital Stay: Payer: Medicare Other | Admitting: Family Medicine

## 2022-07-21 ENCOUNTER — Telehealth: Payer: Self-pay

## 2022-07-21 NOTE — Telephone Encounter (Signed)
   Telephone encounter was:  Successful.  07/21/2022 Name: [name] MRN: [id number] DOB: [birth year]  [name] [name] is a 72 y.o. year old female who is a primary care patient of [name], MD . The community resource team was consulted for assistance with Transportation Needs   Care guide performed the following interventions: Spoke to patient to confim ride for today pickup time 1:35pm [facility] will provide transportation.  Follow Up Plan:  No further follow up planned at this time. The patient has been provided with needed resources.  Chani Ghanem [facility]  [facility] Population Health Community Resource Care Guide   [email or web address]  ?? [phone]   Website: [email or web address]  [email or web address]

## 2022-07-25 ENCOUNTER — Ambulatory Visit: Payer: Self-pay

## 2022-07-25 ENCOUNTER — Telehealth: Payer: Self-pay

## 2022-07-25 NOTE — Patient Outreach (Signed)
  Care Coordination   07/25/2022 Name: Spruha Weight MRN: [id number] DOB: [birth year]   Care Coordination Outreach Attempts:  An unsuccessful telephone outreach was attempted for a scheduled appointment today.  Follow Up Plan:  Additional outreach attempts will be made to offer the patient care coordination information and services.   Encounter Outcome:  No Answer   Care Coordination Interventions:  No, not indicated    Bevelyn Ngo, BSW, CDP Social Worker, Certified Dementia Practitioner [facility] Care Management  Care Coordination [phone]

## 2022-07-25 NOTE — Patient Instructions (Signed)
Visit Information  Thank you for taking time to visit with me today. Please don't hesitate to contact me if I can be of assistance to you.   Following are the goals we discussed today:  -Work with SW to identify reliable transportation options   If you are experiencing a Mental Health or Behavioral Health Crisis or need someone to talk to, please call 911  Patient verbalizes understanding of instructions and care plan provided today and agrees to view in MyChart. Active MyChart status and patient understanding of how to access instructions and care plan via MyChart confirmed with patient.     I will follow up with you later this week once I hear back on transportation options.  Bevelyn Ngo, BSW, CDP Social Worker, Certified Dementia Practitioner [facility] Care Management  Care Coordination [phone]

## 2022-07-25 NOTE — Patient Outreach (Signed)
  Care Coordination   Follow Up Visit Note   07/25/2022 Name: [name] [name] MRN: [id number] DOB: 10/02/1950  [name] [name] is a 72 y.o. year old female who sees [name], MD for primary care. I spoke with  [name] [name] by phone today.  What matters to the patients health and wellness today?  Patient needs reliable transportation for future provider appointments    Goals Addressed             This Visit's Progress    Care Coordination Activities       Care Coordination Interventions: Spoke with the patient who indicates she missed her appointment with her primary care provider on 4/19 because transportation did not pick her up after the ride was confirmed Collaboration with [facility] Transportation to request feedback on why patient was not picked up Performed chart review to note patient was assisted with a [employer] (SCAT) application in January Collaboration with Lorretta Harp to request feedback on the status of the patients application Educated the patient on [employer] offered via [employer] [place] - referral placed Discussed patient has yet to be contacted by the [employer] for free legal consult regarding housing options Patient reports she fell last Thursday in the bathtub and this past Sunday on the stairs. Both knees are painful. She understands she needs to follow up with providers but notes transportation is a barrier Discussed plan for SW to follow up on transportation options, will follow up with the patient later this week         SDOH assessments and interventions completed:  Yes  SDOH Interventions Today    Flowsheet Row Most Recent Value  SDOH Interventions   Transportation Interventions [id number] Referral  [Referred to senior wheels,  follow up on Access app status, previously submitted in Jan]        Care Coordination Interventions:  Yes, provided   Interventions Today    Flowsheet Row Most Recent Value   Chronic Disease   Chronic disease during today's visit Chronic Obstructive Pulmonary Disease (COPD), Hypertension (HTN)  General Interventions   General Interventions Discussed/Reviewed [employer], General Interventions Reviewed        Follow up plan:  SW will continue to follow    Encounter Outcome:  Pt. Visit Completed   [name] [name], [name] [name], CDP Social Worker, Certified Dementia Practitioner [facility] Care Management  Care Coordination [phone]

## 2022-07-27 ENCOUNTER — Ambulatory Visit: Payer: Self-pay

## 2022-07-27 NOTE — Patient Instructions (Signed)
Visit Information  Thank you for taking time to visit with me today. Please don't hesitate to contact me if I can be of assistance to you.   Following are the goals we discussed today:  -Contact Dr. Claris Che office to schedule an office visit -[facility] at [phone] to arrange transportation  Our next appointment is by telephone on 5/14 at 3:00  Please call the care guide team at [phone] if you need to cancel or reschedule your appointment.   If you are experiencing a Mental Health or Behavioral Health Crisis or need someone to talk to, please call 911  Patient verbalizes understanding of instructions and care plan provided today and agrees to view in MyChart. Active MyChart status and patient understanding of how to access instructions and care plan via MyChart confirmed with patient.     Bevelyn Ngo, BSW, CDP Social Worker, Certified Dementia Practitioner [facility] Care Management  Care Coordination [phone]

## 2022-07-27 NOTE — Patient Outreach (Signed)
  Care Coordination   Follow Up Visit Note   07/27/2022 Name: [name] [name] MRN: [id number] DOB: 10-Apr-1950  [name] [name] is a 72 y.o. year old female who sees [name], MD for primary care. I spoke with  [name] [name] by phone today.  What matters to the patients health and wellness today?  The patient would like to schedule an office visit with her primary care provider    Goals Addressed             This Visit's Progress    Care Coordination Activities   On track    Care Coordination Interventions: Confirmed the patient is approved to use [facility] Access (SCAT) for transportation needs Determined the patient has been in touch with [employer] but feels this service will not be appropriate for her considering she must give at least a 10 day notice for services Educated the patient how to use [place] Access providing the contact number for the reservation line Discussed the patient plans to contact her primary care providers office to schedule an office visit, she will plan to contact [place] [facility] for transportation once an appointment is made Encouraged the patient to contact SW as needed, SW will follow up with the patient over the next month Collaboration with Medical illustrator to advise of interventions and plan for the patient to schedule a PCP follow up since she was unable to attend her last appointment due to lack of transportation        SDOH assessments and interventions completed:  No     Care Coordination Interventions:  Yes, provided   Interventions Today    Flowsheet Row Most Recent Value  Chronic Disease   Chronic disease during today's visit Chronic Obstructive Pulmonary Disease (COPD), Hypertension (HTN)  General Interventions   General Interventions Discussed/Reviewed General Interventions Reviewed, [employer], Communication with  Communication with RN        Follow up plan: Follow up call scheduled for 5/14     Encounter Outcome:  Pt. Visit Completed   Bevelyn Ngo, Kenard Gower, CDP Social Worker, Certified Dementia Practitioner [facility] Care Management  Care Coordination [phone]

## 2022-08-02 ENCOUNTER — Other Ambulatory Visit: Payer: Self-pay | Admitting: Cardiovascular Disease

## 2022-08-07 ENCOUNTER — Ambulatory Visit: Payer: Self-pay

## 2022-08-07 NOTE — Patient Outreach (Signed)
  Care Coordination   Follow Up Visit Note   08/07/2022 Name: [name] [name] MRN: [id number] DOB: 1950-10-16  [name] [name] is a 72 y.o. year old female who sees [name], MD for primary care. I spoke with  [name] by phone today.  What matters to the patients health and wellness today?  Staying in her home.    Goals Addressed             This Visit's Progress    Health Maintenance & Independence       Patient Goals/Self Care Activities: -Patient/Caregiver will self-administer medications as prescribed as evidenced by self-report/primary caregiver report  -Patient/Caregiver will attend all scheduled provider appointments as evidenced by clinician review of documented attendance to scheduled appointments and patient/caregiver report -Patient/Caregiver will call provider office for new concerns or questions as evidenced by review of documented incoming telephone call notes and patient report  -Keep your airway clear from mucus build up  -Control your cough by drinking plenty of water -Visit your doctor on a regular basis -Practice and use pursed lip breathing for shortness of breath recovery and prevention -Self assess COPD action plan zone and make appointment with provider if you have been in the yellow zone for 48 hours without improvement. Pace self with activities   Patient with history of falls. Last fall in the bathroom per patient a few weeks ago.  Discussed fall risk and prevention.  RN CM continues to express concern about her safety in her home. Patient declines going to a higher level of care.  Patient did not see PCP as scheduled due to transportation not showing up.  Patient has not set up a repeat appointment yet as she wants to shower first.  Patient states she neighbor to help clean shower and then she will call. Patient states she will also call [facility] [place] for transportation when she has appointment set.    Discussed Medicaid application.  Patient  still has not completed. Encouraged to complete.                    SDOH assessments and interventions completed:  Yes     Care Coordination Interventions:  Yes, provided   Follow up plan: Follow up call scheduled for June    Encounter Outcome:  Pt. Visit Completed   [name], RN, MSN [facility] Care Management Care Management Coordinator Direct Line [phone]

## 2022-08-07 NOTE — Patient Instructions (Signed)
Visit Information  Thank you for taking time to visit with me today. Please don't hesitate to contact me if I can be of assistance to you.   Following are the goals we discussed today:   Goals Addressed             This Visit's Progress    Health Maintenance & Independence       Patient Goals/Self Care Activities: -Patient/Caregiver will self-administer medications as prescribed as evidenced by self-report/primary caregiver report  -Patient/Caregiver will attend all scheduled provider appointments as evidenced by clinician review of documented attendance to scheduled appointments and patient/caregiver report -Patient/Caregiver will call provider office for new concerns or questions as evidenced by review of documented incoming telephone call notes and patient report  -Keep your airway clear from mucus build up  -Control your cough by drinking plenty of water -Visit your doctor on a regular basis -Practice and use pursed lip breathing for shortness of breath recovery and prevention -Self assess COPD action plan zone and make appointment with provider if you have been in the yellow zone for 48 hours without improvement. Pace self with activities   Patient with history of falls. Last fall in the bathroom per patient a few weeks ago.  Discussed fall risk and prevention.  RN CM continues to express concern about her safety in her home. Patient declines going to a higher level of care.  Patient did not see PCP as scheduled due to transportation not showing up.  Patient has not set up a repeat appointment yet as she wants to shower first.  Patient states she neighbor to help clean shower and then she will call. Patient states she will also call [facility] for transportation when she has appointment set.    Discussed Medicaid application.  Patient still has not completed. Encouraged to complete.                    Our next appointment is by telephone on 09/04/22 at 130 pm  Please call the  care guide team at [phone] if you need to cancel or reschedule your appointment.   If you are experiencing a Mental Health or Behavioral Health Crisis or need someone to talk to, please call the Suicide and Crisis Lifeline: 988   Patient verbalizes understanding of instructions and care plan provided today and agrees to view in MyChart. Active MyChart status and patient understanding of how to access instructions and care plan via MyChart confirmed with patient.     The patient has been provided with contact information for the care management team and has been advised to call with any health related questions or concerns.    [name], RN, MSN [facility] Care Management Care Management Coordinator Direct Line [phone]

## 2022-08-09 ENCOUNTER — Other Ambulatory Visit: Payer: Self-pay | Admitting: Cardiovascular Disease

## 2022-08-15 ENCOUNTER — Ambulatory Visit: Payer: Self-pay

## 2022-08-15 NOTE — Patient Instructions (Signed)
Visit Information  Thank you for taking time to visit with me today. Please don't hesitate to contact me if I can be of assistance to you.   Following are the goals we discussed today:  -Contact [facility] to arrange transportation to your appointment with Dr. Clent Ridges - please call me if you have difficulty with this  Our next appointment is by telephone on 8/6 at 3:00  Please call the care guide team at [phone] if you need to cancel or reschedule your appointment.   If you are experiencing a Mental Health or Behavioral Health Crisis or need someone to talk to, please call 1-800-273-TALK (toll free, 24 hour hotline) call 911  Patient verbalizes understanding of instructions and care plan provided today and agrees to view in MyChart. Active MyChart status and patient understanding of how to access instructions and care plan via MyChart confirmed with patient.     Bevelyn Ngo, BSW, CDP Social Worker, Certified Dementia Practitioner [facility] Care Management  Care Coordination [phone]

## 2022-08-15 NOTE — Patient Outreach (Signed)
  Care Coordination   Follow Up Visit Note   08/15/2022 Name: [name] [name] MRN: [id number] DOB: [birth year]  [name] [name] is a 72 y.o. year old female who sees [name] Salisbury, MD for primary care. I spoke with  [name] [name] by phone today.  What matters to the patients health and wellness today?  Patient reports she is doing well today.    Goals Addressed             This Visit's Progress    Care Coordination Activities       Care Coordination Interventions: Performed chart review to note patient is scheduled to see her primary care provider on 5/20 at 1:15 Discussed the patient plans to arrange transportation via [facility]) and will contact them on Friday 5/17 to arrange Advised the patient to contact SW if she needs assistance Assessed for acute resource needs - patient reports she is doing well at this time. She has ordered a tub mat to help prevent slipping in the shower and has not had any recent falls SW will outreach the patient over the next 90 days         SDOH assessments and interventions completed:  No     Care Coordination Interventions:  Yes, provided   Interventions Today    Flowsheet Row Most Recent Value  Chronic Disease   Chronic disease during today's visit Chronic Obstructive Pulmonary Disease (COPD)  General Interventions   General Interventions Discussed/Reviewed General Interventions Reviewed, Doctor Visits  Doctor Visits Discussed/Reviewed Doctor Visits Reviewed        Follow up plan: Follow up call scheduled for 8/6    Encounter Outcome:  Pt. Visit Completed   [name] [name], [name] [name], CDP Social Worker, Certified Dementia Practitioner [facility] Care Management  Care Coordination [phone]

## 2022-08-21 ENCOUNTER — Encounter: Payer: Self-pay | Admitting: Family Medicine

## 2022-08-21 ENCOUNTER — Ambulatory Visit (INDEPENDENT_AMBULATORY_CARE_PROVIDER_SITE_OTHER): Payer: Medicare Other | Admitting: Family Medicine

## 2022-08-21 VITALS — BP 126/80 | HR 54 | Temp 98.1°F | Wt 187.0 lb

## 2022-08-21 DIAGNOSIS — R296 Repeated falls: Secondary | ICD-10-CM | POA: Diagnosis not present

## 2022-08-21 DIAGNOSIS — I10 Essential (primary) hypertension: Secondary | ICD-10-CM

## 2022-08-21 DIAGNOSIS — I25119 Atherosclerotic heart disease of native coronary artery with unspecified angina pectoris: Secondary | ICD-10-CM | POA: Diagnosis not present

## 2022-08-21 DIAGNOSIS — I493 Ventricular premature depolarization: Secondary | ICD-10-CM

## 2022-08-21 DIAGNOSIS — R001 Bradycardia, unspecified: Secondary | ICD-10-CM | POA: Diagnosis not present

## 2022-08-21 MED ORDER — METOPROLOL SUCCINATE ER 50 MG PO TB24
50.0000 mg | ORAL_TABLET | Freq: Every day | ORAL | 5 refills | Status: DC
Start: 2022-08-21 — End: 2023-05-18

## 2022-08-21 NOTE — Progress Notes (Signed)
   Subjective:    Patient ID: [name] [name], female    DOB: 1951/01/03, 72 y.o.   MRN: [id number]  HPI Here to follow up on several issues. She says she has fallen 15 times in the last 4 months. Fortunately these have led to bruises only with no significant trauma. She says she suddenly feels very weak, and her legs give out. She denies any LOC or dizziness. No SOB or chest pain. Over the last few months she has developed a lot of swelling in her legs. Her last ECHO in 202 showed an EF of 60-65% and Grade I diastolic dysfunction. She is taking Flecainide and Metoprolol tartrate 100 mg . She had been taking this BID, but she has cut this back to once a day in the mornings. Her labs during an ED visit on 07-11-22 showed a Hgb of 14.7, creatinine of 1.74, and GFR of 31. She has lost 4 lbs since January.    Review of Systems  Constitutional:  Positive for fatigue. Negative for fever.  Respiratory: Negative.    Cardiovascular:  Positive for leg swelling. Negative for chest pain and palpitations.  Gastrointestinal: Negative.   Genitourinary: Negative.   Neurological: Negative.        Objective:   Physical Exam Constitutional:      Comments: Walks slowly with pain   Cardiovascular:     Rate and Rhythm: Regular rhythm. Bradycardia present.     Pulses: Normal pulses.     Heart sounds: Normal heart sounds.     Comments: Occasional ectopy  Pulmonary:     Effort: Pulmonary effort is normal.     Breath sounds: Normal breath sounds.  Musculoskeletal:     Comments: 2+ edema in both legs up to the knees   Neurological:     Mental Status: She is alert.           Assessment & Plan:  Her fatigue and her falls seem to be related to bradycardia. We will stop the Metoprolol tartrate and change her to Metoprolol succinate, and we wll decrease the dosage to 50 mg once a day. We will check a TSH today. She is scheduled to see her nephrologist, Dr. Malen Gauze, on June 13 and to see her cardiologist, Dr.  Dicie Beam, on July 5. We spent a total of ( 34  ) minutes reviewing records and discussing these issues.   Gershon Crane, MD

## 2022-08-22 LAB — TSH: TSH: 2.17 u[IU]/mL (ref 0.35–5.50)

## 2022-09-04 ENCOUNTER — Ambulatory Visit: Payer: Self-pay

## 2022-09-04 NOTE — Patient Outreach (Signed)
  Care Coordination   Follow Up Visit Note   09/04/2022 Name: [name] [name] MRN: [id number] DOB: 1950/12/17  [name] [name] is a 72 y.o. year old female who sees [name], MD for primary care. I spoke with  [name] by phone today.  What matters to the patients health and wellness today?  Maintainng health    Goals Addressed             This Visit's Progress    Health Maintenance & Independence       Patient Goals/Self Care Activities: -Patient/Caregiver will self-administer medications as prescribed as evidenced by self-report/primary caregiver report  -Patient/Caregiver will attend all scheduled provider appointments as evidenced by clinician review of documented attendance to scheduled appointments and patient/caregiver report -Patient/Caregiver will call provider office for new concerns or questions as evidenced by review of documented incoming telephone call notes and patient report  -Keep your airway clear from mucus build up  -Control your cough by drinking plenty of water -Visit your doctor on a regular basis -Practice and use pursed lip breathing for shortness of breath recovery and prevention -Self assess COPD action plan zone and make appointment with provider if you have been in the yellow zone for 48 hours without improvement. Pace self with activities   Patient with history of falls. Patient reports no recent falls.  Saw PCP since last conversation.  Metoprolol was changed to see if that is reason for falls.   Reiterated fall risk and prevention.  RN CM continues to express concern about her safety in her home.         SDOH assessments and interventions completed:  Yes     Care Coordination Interventions:  Yes, provided   Follow up plan: Follow up call scheduled for July    Encounter Outcome:  Pt. Visit Completed   [name], RN, MSN [facility] Care Management Care Management Coordinator Direct Line [phone]

## 2022-09-04 NOTE — Patient Instructions (Signed)
Visit Information  Thank you for taking time to visit with me today. Please don't hesitate to contact me if I can be of assistance to you.   Following are the goals we discussed today:   Goals Addressed             This Visit's Progress    Health Maintenance & Independence       Patient Goals/Self Care Activities: -Patient/Caregiver will self-administer medications as prescribed as evidenced by self-report/primary caregiver report  -Patient/Caregiver will attend all scheduled provider appointments as evidenced by clinician review of documented attendance to scheduled appointments and patient/caregiver report -Patient/Caregiver will call provider office for new concerns or questions as evidenced by review of documented incoming telephone call notes and patient report  -Keep your airway clear from mucus build up  -Control your cough by drinking plenty of water -Visit your doctor on a regular basis -Practice and use pursed lip breathing for shortness of breath recovery and prevention -Self assess COPD action plan zone and make appointment with provider if you have been in the yellow zone for 48 hours without improvement. Pace self with activities   Patient with history of falls. Patient reports no recent falls.  Saw PCP since last conversation.  Metoprolol was changed to see if that is reason for falls.   Reiterated fall risk and prevention.  RN CM continues to express concern about her safety in her home.         Our next appointment is by telephone on 10/16/22 at 130 pm  Please call the care guide team at [phone] if you need to cancel or reschedule your appointment.   If you are experiencing a Mental Health or Behavioral Health Crisis or need someone to talk to, please call the Suicide and Crisis Lifeline: 988   Patient verbalizes understanding of instructions and care plan provided today and agrees to view in MyChart. Active MyChart status and patient understanding of how to  access instructions and care plan via MyChart confirmed with patient.     The patient has been provided with contact information for the care management team and has been advised to call with any health related questions or concerns.   [name], RN, MSN [facility] Care Management Care Management Coordinator Direct Line [phone]

## 2022-09-08 DIAGNOSIS — N1832 Chronic kidney disease, stage 3b: Secondary | ICD-10-CM | POA: Diagnosis not present

## 2022-09-14 DIAGNOSIS — I2510 Atherosclerotic heart disease of native coronary artery without angina pectoris: Secondary | ICD-10-CM | POA: Diagnosis not present

## 2022-09-14 DIAGNOSIS — K219 Gastro-esophageal reflux disease without esophagitis: Secondary | ICD-10-CM | POA: Diagnosis not present

## 2022-09-14 DIAGNOSIS — I5032 Chronic diastolic (congestive) heart failure: Secondary | ICD-10-CM | POA: Diagnosis not present

## 2022-09-14 DIAGNOSIS — R918 Other nonspecific abnormal finding of lung field: Secondary | ICD-10-CM | POA: Diagnosis not present

## 2022-09-14 DIAGNOSIS — M159 Polyosteoarthritis, unspecified: Secondary | ICD-10-CM | POA: Diagnosis not present

## 2022-09-14 DIAGNOSIS — N1832 Chronic kidney disease, stage 3b: Secondary | ICD-10-CM | POA: Diagnosis not present

## 2022-09-14 DIAGNOSIS — I129 Hypertensive chronic kidney disease with stage 1 through stage 4 chronic kidney disease, or unspecified chronic kidney disease: Secondary | ICD-10-CM | POA: Diagnosis not present

## 2022-09-14 DIAGNOSIS — E279 Disorder of adrenal gland, unspecified: Secondary | ICD-10-CM | POA: Diagnosis not present

## 2022-09-14 DIAGNOSIS — N2581 Secondary hyperparathyroidism of renal origin: Secondary | ICD-10-CM | POA: Diagnosis not present

## 2022-09-27 ENCOUNTER — Other Ambulatory Visit: Payer: Self-pay | Admitting: Family Medicine

## 2022-10-06 ENCOUNTER — Ambulatory Visit: Payer: Medicare Other | Admitting: Physician Assistant

## 2022-10-16 ENCOUNTER — Ambulatory Visit: Payer: Self-pay

## 2022-10-16 NOTE — Patient Outreach (Signed)
  Care Coordination   10/16/2022 Name: [name] [name] MRN: [id number] DOB: Jun 01, 1950   Care Coordination Outreach Attempts:  An unsuccessful telephone outreach was attempted today to offer the patient information about available care coordination services.  Follow Up Plan:  Additional outreach attempts will be made to offer the patient care coordination information and services.   Encounter Outcome:  No Answer   Care Coordination Interventions:  No, not indicated    [name], RN, MSN [facility] Care Management Care Management Coordinator Direct Line [phone]

## 2022-10-17 ENCOUNTER — Telehealth: Payer: Self-pay

## 2022-10-17 NOTE — Patient Outreach (Signed)
  Care Coordination   10/17/2022 Name: [name] [name] MRN: [id number] DOB: Jun 07, 1950   Care Coordination Outreach Attempts:  A second unsuccessful outreach was attempted today to offer the patient with information about available care coordination services.  Follow Up Plan:  Additional outreach attempts will be made to offer the patient care coordination information and services.   Encounter Outcome:  No Answer   Care Coordination Interventions:  No, not indicated    [name], RN, MSN [facility] Care Management Care Management Coordinator Direct Line [phone]

## 2022-10-24 ENCOUNTER — Telehealth: Payer: Self-pay

## 2022-10-24 NOTE — Patient Outreach (Signed)
  Care Coordination   10/24/2022 Name: [name] [name] MRN: [id number] DOB: 1950-12-22   Care Coordination Outreach Attempts:  A third unsuccessful outreach was attempted today to offer the patient with information about available care coordination services.  Follow Up Plan:  No further outreach attempts will be made at this time. We have been unable to contact the patient to offer or enroll patient in care coordination services  Encounter Outcome:  No Answer   Care Coordination Interventions:  No, not indicated    [name], RN, MSN [facility] Care Management Care Management Coordinator Direct Line [phone]

## 2022-11-07 ENCOUNTER — Telehealth: Payer: Self-pay

## 2022-11-07 NOTE — Patient Outreach (Signed)
  Care Coordination   11/07/2022 Name: Maeghan Grape MRN: [id number] DOB: Feb 14, 1951   Care Coordination Outreach Attempts:  An unsuccessful telephone outreach was attempted for a scheduled appointment today.  Follow Up Plan:  Additional outreach attempts will be made to offer the patient care coordination information and services.   Encounter Outcome:  No Answer   Care Coordination Interventions:  No, not indicated    Bevelyn Ngo, BSW, CDP Social Worker, Certified Dementia Practitioner [facility] Care Management  Care Coordination [phone]

## 2022-11-10 ENCOUNTER — Telehealth: Payer: Self-pay

## 2022-11-10 NOTE — Patient Outreach (Signed)
  Care Coordination   11/10/2022 Name: Emberli Linnehan MRN: [id number] DOB: 02-09-1951   Care Coordination Outreach Attempts:  SW placed a second unsuccessful outbound call to the patient to follow up on care coordination needs. SW left a HIPAA compliant voice message requesting a return call.  Follow Up Plan:  Additional outreach attempts will be made to offer the patient care coordination information and services.   Encounter Outcome:  No Answer   Care Coordination Interventions:  No, not indicated    Bevelyn Ngo, BSW, CDP Social Worker, Certified Dementia Practitioner [facility] Care Management  Care Coordination [phone]

## 2022-11-13 ENCOUNTER — Other Ambulatory Visit: Payer: Self-pay | Admitting: Cardiovascular Disease

## 2022-11-17 ENCOUNTER — Telehealth: Payer: Self-pay

## 2022-11-17 NOTE — Patient Outreach (Signed)
  Care Coordination   11/17/2022 Name: [name] [name] MRN: [id number] DOB: 11-14-1950   Care Coordination Outreach Attempts:  A third unsuccessful outreach was attempted today to offer the patient with information about available care coordination services.  Follow Up Plan:  No further outreach attempts will be made at this time. We have been unable to contact the patient to offer or enroll patient in care coordination services  Encounter Outcome:  No Answer   Care Coordination Interventions:  No, not indicated    Bevelyn Ngo, BSW, CDP Social Worker, Certified Dementia Practitioner [facility] Care Management  Care Coordination [phone]

## 2022-11-28 ENCOUNTER — Ambulatory Visit: Payer: Medicare Other | Admitting: Cardiology

## 2022-12-29 ENCOUNTER — Other Ambulatory Visit: Payer: Self-pay | Admitting: Family Medicine

## 2023-02-10 ENCOUNTER — Other Ambulatory Visit: Payer: Self-pay | Admitting: Cardiovascular Disease

## 2023-03-09 ENCOUNTER — Telehealth: Payer: Self-pay

## 2023-03-09 NOTE — Telephone Encounter (Signed)
Unsuccessful attempts to reach patient on preferred number listed in notes for scheduled AWV. Unable to leave message on voicemail.

## 2023-03-11 ENCOUNTER — Other Ambulatory Visit: Payer: Self-pay | Admitting: Cardiovascular Disease

## 2023-03-22 DIAGNOSIS — N390 Urinary tract infection, site not specified: Secondary | ICD-10-CM | POA: Diagnosis not present

## 2023-03-22 DIAGNOSIS — N1832 Chronic kidney disease, stage 3b: Secondary | ICD-10-CM | POA: Diagnosis not present

## 2023-03-26 DIAGNOSIS — K219 Gastro-esophageal reflux disease without esophagitis: Secondary | ICD-10-CM | POA: Diagnosis not present

## 2023-03-26 DIAGNOSIS — I129 Hypertensive chronic kidney disease with stage 1 through stage 4 chronic kidney disease, or unspecified chronic kidney disease: Secondary | ICD-10-CM | POA: Diagnosis not present

## 2023-03-26 DIAGNOSIS — E279 Disorder of adrenal gland, unspecified: Secondary | ICD-10-CM | POA: Diagnosis not present

## 2023-03-26 DIAGNOSIS — R918 Other nonspecific abnormal finding of lung field: Secondary | ICD-10-CM | POA: Diagnosis not present

## 2023-03-26 DIAGNOSIS — N1832 Chronic kidney disease, stage 3b: Secondary | ICD-10-CM | POA: Diagnosis not present

## 2023-03-26 DIAGNOSIS — M159 Polyosteoarthritis, unspecified: Secondary | ICD-10-CM | POA: Diagnosis not present

## 2023-03-26 DIAGNOSIS — I5032 Chronic diastolic (congestive) heart failure: Secondary | ICD-10-CM | POA: Diagnosis not present

## 2023-03-26 DIAGNOSIS — I2510 Atherosclerotic heart disease of native coronary artery without angina pectoris: Secondary | ICD-10-CM | POA: Diagnosis not present

## 2023-03-26 DIAGNOSIS — N2581 Secondary hyperparathyroidism of renal origin: Secondary | ICD-10-CM | POA: Diagnosis not present

## 2023-03-29 ENCOUNTER — Other Ambulatory Visit: Payer: Self-pay | Admitting: Cardiovascular Disease

## 2023-04-02 ENCOUNTER — Other Ambulatory Visit: Payer: Self-pay | Admitting: Cardiovascular Disease

## 2023-04-02 ENCOUNTER — Telehealth: Payer: Self-pay | Admitting: Cardiovascular Disease

## 2023-04-02 MED ORDER — FLECAINIDE ACETATE 50 MG PO TABS: 50.0000 mg | ORAL_TABLET | Freq: Three times a day (TID) | ORAL | 1 refills | Status: DC

## 2023-04-02 MED ORDER — MAGNESIUM 400 MG PO CAPS: 400.0000 mg | ORAL_CAPSULE | Freq: Every day | ORAL | 1 refills | Status: DC

## 2023-04-02 NOTE — Telephone Encounter (Signed)
*  STAT* If patient is at the pharmacy, call can be transferred to refill team.   1. Which medications need to be refilled? (please list name of each medication and dose if known)   flecainide (TAMBOCOR) 50 MG tablet  Magnesium Oxide -Mg Supplement 400 MG CAPS   4. Which pharmacy/location (including street and city if local pharmacy) is medication to be sent to? [id number] - [place], [place] - [address]     5. Do they need a 30 day or 90 day supply? 90  Pt scheduled for 06/04/23 next available with Dr. Clifton James

## 2023-04-02 NOTE — Telephone Encounter (Signed)
Pt's medications were sent to pt's pharmacy as requested. Confirmation received.  

## 2023-04-28 ENCOUNTER — Other Ambulatory Visit: Payer: Self-pay | Admitting: Cardiovascular Disease

## 2023-05-02 ENCOUNTER — Other Ambulatory Visit: Payer: Self-pay | Admitting: Cardiovascular Disease

## 2023-05-13 ENCOUNTER — Other Ambulatory Visit: Payer: Self-pay | Admitting: Family Medicine

## 2023-05-14 ENCOUNTER — Ambulatory Visit (INDEPENDENT_AMBULATORY_CARE_PROVIDER_SITE_OTHER): Payer: Medicare Other

## 2023-05-14 ENCOUNTER — Encounter: Payer: Self-pay | Admitting: Family Medicine

## 2023-05-14 ENCOUNTER — Encounter: Payer: Medicare Other | Admitting: Family Medicine

## 2023-05-14 ENCOUNTER — Ambulatory Visit (INDEPENDENT_AMBULATORY_CARE_PROVIDER_SITE_OTHER): Payer: Medicare Other | Admitting: Family Medicine

## 2023-05-14 ENCOUNTER — Other Ambulatory Visit: Payer: Self-pay | Admitting: Family Medicine

## 2023-05-14 VITALS — BP 140/96 | HR 68 | Temp 97.8°F | Ht 60.0 in | Wt 192.0 lb

## 2023-05-14 VITALS — BP 140/96 | HR 68 | Temp 97.8°F | Ht 60.5 in | Wt 192.6 lb

## 2023-05-14 DIAGNOSIS — Z122 Encounter for screening for malignant neoplasm of respiratory organs: Secondary | ICD-10-CM

## 2023-05-14 DIAGNOSIS — R413 Other amnesia: Secondary | ICD-10-CM | POA: Diagnosis not present

## 2023-05-14 DIAGNOSIS — Z0000 Encounter for general adult medical examination without abnormal findings: Secondary | ICD-10-CM | POA: Diagnosis not present

## 2023-05-14 DIAGNOSIS — F411 Generalized anxiety disorder: Secondary | ICD-10-CM | POA: Diagnosis not present

## 2023-05-14 DIAGNOSIS — F321 Major depressive disorder, single episode, moderate: Secondary | ICD-10-CM

## 2023-05-14 MED ORDER — AMLODIPINE BESYLATE 2.5 MG PO TABS: 5.0000 mg | ORAL_TABLET | Freq: Every day | ORAL | Status: DC

## 2023-05-14 MED ORDER — ALBUTEROL SULFATE HFA 108 (90 BASE) MCG/ACT IN AERS: 2.0000 | INHALATION_SPRAY | Freq: Four times a day (QID) | RESPIRATORY_TRACT | 11 refills | Status: AC | PRN

## 2023-05-14 MED ORDER — ARIPIPRAZOLE 2 MG PO TABS
2.0000 mg | ORAL_TABLET | Freq: Every day | ORAL | 2 refills | Status: DC
Start: 2023-05-14 — End: 2023-05-21

## 2023-05-14 NOTE — Telephone Encounter (Signed)
Copied from CRM [id number]. Topic: Clinical - Medication Refill >> May 14, 2023 12:03 PM Truddie Crumble wrote: Most Recent Primary Care Visit:  Provider: Gershon Crane A  Department: [facility]  Visit Type: OFFICE VISIT  Date: 08/21/2022  Medication: metoprolol succinate   Has the patient contacted their pharmacy? Yes (Agent: If no, request that the patient contact the pharmacy for the refill. If patient does not wish to contact the pharmacy document the reason why and proceed with request.) (Agent: If yes, when and what did the pharmacy advise?)  Is this the correct pharmacy for this prescription? Yes If no, delete pharmacy and type the correct one.  This is the patient's preferred pharmacy:  [id number] [place], [place] - [address] [place] [address] Phone: [phone] Fax: [phone]   Has the prescription been filled recently? Yes  Is the patient out of the medication? No  Has the patient been seen for an appointment in the last year OR does the patient have an upcoming appointment? Yes  Can we respond through MyChart? Yes  Agent: Please be advised that Rx refills may take up to 3 business days. We ask that you follow-up with your pharmacy.

## 2023-05-14 NOTE — Progress Notes (Signed)
   Subjective:    Patient ID: [name] [name], female    DOB: [birth year], 73 y.o.   MRN: [id number]  HPI Here to discuss her depression and memory loss. We had been treating her with Wellbutrin  and Zoloft , but she stopped taking these about 5 months ago because she felt they were making her lightheaded and prone to falling. She says these symptoms have improved. She was also taking Aricept , but she stopped this due to nausea. This also resolved. However her depression has gotten a little worse since then. She has little motivation to clean her home, and she seldom leaves the house. Her sleep is hit or miss, and her appetite is poor. She denies any suicidal thoughts however. Her memory is still a problem, but she does not want to address this right now.   Review of Systems  Constitutional: Negative.   Respiratory: Negative.    Cardiovascular: Negative.   Psychiatric/Behavioral:  Positive for decreased concentration, dysphoric mood and sleep disturbance. Negative for agitation, behavioral problems, confusion, hallucinations, self-injury and suicidal ideas. The patient is nervous/anxious.        Objective:   Physical Exam Constitutional:      Appearance: Normal appearance.  Cardiovascular:     Rate and Rhythm: Normal rate and regular rhythm.     Pulses: Normal pulses.     Heart sounds: Normal heart sounds.  Pulmonary:     Effort: Pulmonary effort is normal.     Breath sounds: Normal breath sounds.  Neurological:     General: No focal deficit present.     Mental Status: She is alert and oriented to person, place, and time.  Psychiatric:        Behavior: Behavior normal.        Thought Content: Thought content normal.     Comments: Affect is mildly depressed            Assessment & Plan:  Her depression has worsened while her memory loss seems to be stable. She declines seeing a therapist. We will try her on Abilify  2 mg daily. She will be back in a few weeks for a well exam,  and we will follow up her depression at that time.  Corita Diego, MD

## 2023-05-14 NOTE — Patient Instructions (Addendum)
 [name] [name] , Thank you for taking time to come for your Medicare Wellness Visit. I appreciate your ongoing commitment to your health goals. Please review the following plan we discussed and let me know if I can assist you in the future.   Referrals/Orders/Follow-Ups/Clinician Recommendations:   This is a list of the screening recommended for you and due dates:  Health Maintenance  Topic Date Due   COVID-19 Vaccine (1) Never done   Hepatitis C Screening  Never done   Zoster (Shingles) Vaccine (1 of 2) Never done   DEXA scan (bone density measurement)  Never done   Pneumonia Vaccine (2 of 2 - PPSV23 or PCV20) 03/04/2018   Mammogram  02/15/2020   Screening for Lung Cancer  04/13/2022   Flu Shot  07/02/2023*   Colon Cancer Screening  10/18/2023   Medicare Annual Wellness Visit  05/13/2024   DTaP/Tdap/Td vaccine (2 - Td or Tdap) 12/11/2027   HPV Vaccine  Aged Out  *Topic was postponed. The date shown is not the original due date.    Advanced directives: (Declined) Advance directive discussed with you today. Even though you declined this today, please call our office should you change your mind, and we can give you the proper paperwork for you to fill out.  Next Medicare Annual Wellness Visit scheduled for next year: Yes

## 2023-05-14 NOTE — Progress Notes (Signed)
 Subjective:   [name] [name] is a 73 y.o. female who presents for Medicare Annual (Subsequent) preventive examination.  Visit Complete: In person    Cardiac Risk Factors include: advanced age (>2men, >19 women);hypertension     Objective:    Today's Vitals   05/14/23 1048  BP: (!) 140/96  Pulse: 68  Temp: 97.8 F (36.6 C)  TempSrc: Oral  Weight: 192 lb (87.1 kg)  Height: 5' (1.524 m)   Body mass index is 37.5 kg/m.     05/14/2023   11:19 AM 07/11/2022    8:08 PM 03/06/2022    3:04 PM 03/03/2021    3:01 PM 03/02/2020    2:58 PM 06/08/2016    7:47 AM 06/07/2016    9:18 AM  Advanced Directives  Does Patient Have a Medical Advance Directive? No No No No No No No  Does patient want to make changes to medical advance directive?     No - Patient declined    Would patient like information on creating a medical advance directive? No - Patient declined  No - Patient declined No - Patient declined  No - Patient declined No - Patient declined    Current Medications (verified) Outpatient Encounter Medications as of 05/14/2023  Medication Sig   ALPRAZolam  (XANAX ) 0.25 MG tablet TAKE 3 TABLETS BY MOUTH EVERY 6 HOURS AS NEEDED   aspirin  EC 81 MG tablet Take 1 tablet (81 mg total) by mouth daily.   cetirizine (ZYRTEC) 10 MG tablet Take 10 mg by mouth daily as needed for allergies.    cholecalciferol (VITAMIN D3) 25 MCG (1000 UNIT) tablet Take 1,000 Units by mouth daily.   flecainide  (TAMBOCOR ) 50 MG tablet Take 1 tablet (50 mg total) by mouth 3 (three) times daily.   lisinopril  (ZESTRIL ) 10 MG tablet Take 10 mg by mouth daily.   Magnesium  400 MG CAPS Take 400 mg by mouth daily.   Magnesium  Oxide -Mg Supplement 400 MG CAPS TAKE 1 CAPSULE BY MOUTH EVERY DAY   metoprolol  succinate (TOPROL -XL) 50 MG 24 hr tablet Take 1 tablet (50 mg total) by mouth daily. Take with or immediately following a meal.   nitroGLYCERIN  (NITROSTAT ) 0.4 MG SL tablet Place 1 tablet (0.4 mg total) under the tongue  every 5 (five) minutes as needed for chest pain.   [DISCONTINUED] albuterol  (PROAIR  HFA) 108 (90 Base) MCG/ACT inhaler Inhale 2 puffs into the lungs every 6 (six) hours as needed for wheezing.   [DISCONTINUED] amLODipine  (NORVASC ) 2.5 MG tablet Take 2.5 mg by mouth daily.   [DISCONTINUED] atorvastatin  (LIPITOR) 20 MG tablet TAKE 1 TABLET BY MOUTH EVERY DAY (Patient not taking: Reported on 05/14/2023)   [DISCONTINUED] buPROPion  (WELLBUTRIN  XL) 300 MG 24 hr tablet TAKE 1 TABLET BY MOUTH EVERY DAY (Patient not taking: Reported on 05/14/2023)   [DISCONTINUED] donepezil  (ARICEPT ) 5 MG tablet Take 1 tablet (5 mg total) by mouth at bedtime. (Patient not taking: Reported on 05/14/2023)   [DISCONTINUED] pantoprazole  (PROTONIX ) 40 MG tablet TAKE 1 TABLET BY MOUTH TWICE A DAY (Patient not taking: Reported on 05/14/2023)   [DISCONTINUED] sertraline  (ZOLOFT ) 100 MG tablet TAKE 1 TABLET BY MOUTH EVERY DAY (Patient not taking: Reported on 05/14/2023)   No facility-administered encounter medications on file as of 05/14/2023.    Allergies (verified) Augmentin  [amoxicillin -pot clavulanate], Clindamycin/lincomycin, Colchicine , Flecainide , and Hydroxychloroquine   History: Past Medical History:  Diagnosis Date   Allergic rhinitis    ALLERGIC RHINITIS 05/08/2007   Qualifier: Diagnosis of  By: Derick Fleeting,  CMA, Cindy     Allergy    seasonal   Anxiety    Anxiety state 05/25/2014   Aortic valve disease    a. ? possible bicuspid AV.   CAD (coronary artery disease)    a. 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 mild dilated aortic root on prior echo, ascending aorta prominence on CT 2015.   Duodenal mass    GERD 05/08/2007   Qualifier: Diagnosis of  By: Derick Fleeting, CMA, Cindy     Hyperlipidemia    Hypertension    Insomnia    Neck pain, chronic 03/09/2016   NICOTINE ADDICTION 05/08/2007   Qualifier: Diagnosis of  By: Alyne Babinski MD, Mara Seminole A    Osteoarthritis    Overactive bladder    Pneumonia    once or twivce in past   Premature ventricular contractions    LBBB inferior axis PVCs   Thoracic back pain 05/30/2014   Tobacco abuse    Ulcer    duodenal   Past Surgical History:  Procedure Laterality Date   BREAST BIOPSY     CARDIAC CATHETERIZATION N/A 12/03/2015   Procedure: Left Heart Cath and Coronary Angiography;  Surgeon: Odie Benne, MD;  Location: [facility] INVASIVE CV LAB;  Service: Cardiovascular;  Laterality: N/A;   CARPAL TUNNEL RELEASE Left    1997 left,     CHOLECYSTECTOMY N/A 03/23/2015   Procedure: LAPAROSCOPIC CHOLECYSTECTOMY;  Surgeon: Shela Derby, MD;  Location: [facility] OR;  Service: General;  Laterality: N/A;   COLONOSCOPY  10/17/2013   per Dr. Grandville Lax, benign polyps, repeat in 10 years   EUS N/A 11/12/2014   Procedure: UPPER ENDOSCOPIC ULTRASOUND (EUS) LINEAR;  Surgeon: Janel Medford, MD;  Location: [facility] ENDOSCOPY;  Service: Endoscopy;  Laterality: N/A;   EUS N/A 06/08/2016   Procedure: UPPER ENDOSCOPIC ULTRASOUND (EUS) RADIAL;  Surgeon: Janel Medford, MD;  Location: [facility] ENDOSCOPY;  Service: Endoscopy;  Laterality: N/A;   EYE SURGERY     LEFT AND RIGHT HEART CATHETERIZATION WITH CORONARY ANGIOGRAM N/A 04/22/2014   Procedure: LEFT AND RIGHT HEART CATHETERIZATION WITH CORONARY ANGIOGRAM;  Surgeon: Odie Benne, MD;  Location: [facility] CATH LAB;  Service: Cardiovascular;  Laterality: N/A;   TONSILLECTOMY  1950   torn cartilage repaired rt wrist Right 2003   Family History  Problem Relation Age  of Onset   CAD Brother    Kidney disease Brother    Emphysema Mother    Other Father        brain tumor   Throat cancer Brother    Hyperlipidemia Other    Hypertension Other    Kidney disease Other    Heart disease Other    Colon cancer Neg Hx    Esophageal cancer Neg Hx    Stomach cancer Neg Hx    Rectal cancer Neg Hx    Social History   Socioeconomic History   Marital status: Single    Spouse  name: Not on file   Number of children: 0   Years of education: Not on file   Highest education level: Not on file  Occupational History   Occupation: CNA  Tobacco Use   Smoking status: Former    Current packs/day: 0.00    Average packs/day: 0.5 packs/day for 54.0 years (24.3 ttl pk-yrs)    Types: Cigarettes    Start date: 01/02/1963    Quit date: 01/01/2017    Years since quitting: 6.3   Smokeless tobacco: Never   Tobacco comments:    1/2 pack per day or less  Vaping Use   Vaping status: Never Used  Substance and Sexual Activity   Alcohol use: No    Alcohol/week: 0.0 standard drinks of alcohol   Drug use: No   Sexual activity: Not on file  Other Topics Concern   Not on file  Social History Narrative   Works as Lawyer at [employer].   Social Drivers of Corporate investment banker Strain: Low Risk  (05/14/2023)   Overall Financial Resource Strain (CARDIA)    Difficulty of Paying Living Expenses: Not hard at all  Food Insecurity: No Food Insecurity (05/14/2023)   Hunger Vital Sign    Worried About Running Out of Food in the Last Year: Never true    Ran Out of Food in the Last Year: Never true  Transportation Needs: No Transportation Needs (05/14/2023)   PRAPARE - Administrator, Civil Service (Medical): No    Lack of Transportation (Non-Medical): No  Physical Activity: Inactive (05/14/2023)   Exercise Vital Sign    Days of Exercise per Week: 0 days    Minutes of Exercise per Session: 0 min  Stress: No Stress Concern Present (05/14/2023)   [employer]  of Occupational Health - Occupational Stress Questionnaire    Feeling of Stress : Not at all  Social Connections: Socially Isolated (05/14/2023)   Social Connection and Isolation Panel [NHANES]    Frequency of Communication with Friends and Family: More than three times a week    Frequency of Social Gatherings with Friends and Family: Three times a week    Attends Religious Services: Never    Active Member of Clubs or Organizations: No    Attends Banker Meetings: Never    Marital Status: Never married    Tobacco Counseling Counseling given: Not Answered Tobacco comments: 1/2 pack per day or less   Clinical Intake:  Pre-visit preparation completed: Yes  Pain : No/denies pain     BMI - recorded: 37.5 Nutritional Status: BMI > 30  Obese Nutritional Risks: None Diabetes: No  How often do you need to have someone help you when you read instructions, pamphlets, or other written materials from your doctor or pharmacy?: 1 - Never  Interpreter Needed?: No  Information entered by :: Farris Hong LPN   Activities of Daily Living    05/14/2023   11:16 AM  In your present state of health, do you have any difficulty performing the following activities:  Hearing? 0  Vision? 0  Difficulty concentrating or making decisions? 0  Walking or climbing stairs? 0  Dressing or bathing? 0  Doing errands, shopping? 0  Preparing Food and eating ? N  Using the Toilet? N  In the past six months, have you accidently leaked urine? Y  Comment Wears pads. Followed by PCP  Do you have problems with loss of bowel control? N  Managing your Medications? N  Managing your Finances? N  Housekeeping or managing your Housekeeping? N    Patient Care Team: Donley Furth, MD as PCP - General Abel Hoe Coy Ditty, MD as PCP - Cardiology (Cardiology) Jolly Needle, MD (Inactive) as PCP - Electrophysiology (Cardiology) Alver Austin, [facility] (Inactive) as Pharmacist  (Pharmacist)  Indicate any recent Medical Services you may have received from other than [facility] providers in the past year (date may be approximate).     Assessment:   This is a routine wellness examination for Tehreem.  Hearing/Vision screen Hearing Screening - Comments:: Denies hearing difficulties   Vision Screening - Comments:: Wears rx glasses - up to date with routine eye exams with  Dr Rachel Budds   Goals Addressed               This Visit's Progress     Stay active as possiable (pt-stated)         Depression Screen    05/14/2023   11:15 AM 05/14/2023   10:35 AM 06/05/2022    3:20 PM 06/05/2022    3:16 PM 04/05/2022    2:24 PM 03/06/2022    2:58 PM 01/20/2022   11:56 AM  PHQ 2/9 Scores  PHQ - 2 Score 0 6 1 0 0 0 0  PHQ- 9 Score 0 24    0 0    Fall Risk    05/14/2023   11:17 AM 05/14/2023   10:35 AM 08/21/2022    1:27 PM 08/07/2022    2:00 PM 04/24/2022    4:36 PM  Fall Risk   Falls in the past year? 1 1 1 1 1   Number falls in past yr: 1 1 1 1 1   Comment   Pt reported she has had 15 falls since Jan 2024 due to weakness in her legs    Injury with Fall? 0 1 1 0 1  Comment     Knee injury  Risk for fall due to : No Fall Risks Other (Comment) Impaired balance/gait;History of fall(s) History of fall(s);Impaired balance/gait History of fall(s)  Follow up Falls prevention discussed;Falls evaluation completed Falls evaluation completed Falls evaluation completed;Falls prevention discussed;Follow up appointment  Falls prevention discussed;Follow up appointment;Falls evaluation completed    MEDICARE RISK AT HOME: Medicare Risk at Home Any stairs in or around the home?: Yes If so, are there any without handrails?: No Home free of loose throw rugs in walkways, pet beds, electrical cords, etc?: Yes Adequate lighting in your home to reduce risk of falls?: Yes Life alert?: No Use of a cane, walker or w/c?: No Grab bars in the bathroom?: No Shower chair or bench in shower?: No Elevated  toilet seat or a handicapped toilet?: No  TIMED UP AND GO:  Was the test performed?  Yes  Length of time to ambulate 10 feet: 10 sec Gait slow and steady without use of assistive device    Cognitive Function:      07/20/2016    1:28 PM  Montreal Cognitive Assessment   Visuospatial/ Executive (0/5) 4  Naming (0/3) 3  Attention: Read list of digits (0/2) 2  Attention: Read list of letters (0/1) 1  Attention: Serial 7 subtraction starting at 100 (0/3) 3  Language: Repeat phrase (0/2) 2  Language : Fluency (0/1) 0  Abstraction (0/2) 2  Delayed Recall (0/5) 0  Orientation (0/6) 6  Total 23  Adjusted Score (based on education) 24      05/14/2023   11:19 AM 03/06/2022  3:04 PM 03/03/2021    2:59 PM  6CIT Screen  What Year? 0 points 0 points 0 points  What month? 0 points 0 points 0 points  What time? 0 points 0 points 0 points  Count back from 20 0 points 0 points 0 points  Months in reverse 0 points 4 points 0 points  Repeat phrase 0 points 0 points 0 points  Total Score 0 points 4 points 0 points    Immunizations Immunization History  Administered Date(s) Administered   Influenza, High Dose Seasonal PF 02/22/2015, 02/02/2016, 01/23/2017, 01/07/2018   Influenza-Unspecified 02/01/2014   Pneumococcal Conjugate-13 01/07/2018   Tdap 12/10/2017    TDAP status: Up to date  Flu Vaccine status: Declined, Education has been provided regarding the importance of this vaccine but patient still declined. Advised may receive this vaccine at local pharmacy or Health Dept. Aware to provide a copy of the vaccination record if obtained from local pharmacy or Health Dept. Verbalized acceptance and understanding.  Pneumococcal vaccine status: Declined,  Education has been provided regarding the importance of this vaccine but patient still declined. Advised may receive this vaccine at local pharmacy or Health Dept. Aware to provide a copy of the vaccination record if obtained from local  pharmacy or Health Dept. Verbalized acceptance and understanding.   Covid-19 vaccine status: Declined, Education has been provided regarding the importance of this vaccine but patient still declined. Advised may receive this vaccine at local pharmacy or Health Dept.or vaccine clinic. Aware to provide a copy of the vaccination record if obtained from local pharmacy or Health Dept. Verbalized acceptance and understanding.  Qualifies for Shingles Vaccine? Yes   Zostavax completed No   Shingrix Completed?: No.    Education has been provided regarding the importance of this vaccine. Patient has been advised to call insurance company to determine out of pocket expense if they have not yet received this vaccine. Advised may also receive vaccine at local pharmacy or Health Dept. Verbalized acceptance and understanding.  Screening Tests Health Maintenance  Topic Date Due   COVID-19 Vaccine (1) Never done   Hepatitis C Screening  Never done   Zoster Vaccines- Shingrix (1 of 2) Never done   DEXA SCAN  Never done   Pneumonia Vaccine 90+ Years old (2 of 2 - PPSV23 or PCV20) 03/04/2018   MAMMOGRAM  02/15/2020   Lung Cancer Screening  04/13/2022   INFLUENZA VACCINE  07/02/2023 (Originally 11/02/2022)   Colonoscopy  10/18/2023   Medicare Annual Wellness (AWV)  05/13/2024   DTaP/Tdap/Td (2 - Td or Tdap) 12/11/2027   HPV VACCINES  Aged Out    Health Maintenance  Health Maintenance Due  Topic Date Due   COVID-19 Vaccine (1) Never done   Hepatitis C Screening  Never done   Zoster Vaccines- Shingrix (1 of 2) Never done   DEXA SCAN  Never done   Pneumonia Vaccine 68+ Years old (2 of 2 - PPSV23 or PCV20) 03/04/2018   MAMMOGRAM  02/15/2020   Lung Cancer Screening  04/13/2022    Colorectal cancer screening: Type of screening: Colonoscopy. Completed 10/17/13. Repeat every 10 years  Mammogram status: Ordered Patient declined. Pt provided with contact info and advised to call to schedule appt.   Bone  Density status: Ordered Patient declined. Pt provided with contact info and advised to call to schedule appt.   Lung Cancer Screening: (Low Dose CT Chest recommended if Age 74-80 years, 20 pack-year currently smoking OR have quit w/in 15years.) does qualify.  Lung Cancer Screening Referral: 05/14/23  Additional Screening:  Hepatitis C Screening: does qualify; Completed Deferred  Vision Screening: Recommended annual ophthalmology exams for early detection of glaucoma and other disorders of the eye. Is the patient up to date with their annual eye exam?  Yes  Who is the provider or what is the name of the office in which the patient attends annual eye exams? Dr Rachel Budds If pt is not established with a provider, would they like to be referred to a provider to establish care? No .   Dental Screening: Recommended annual dental exams for proper oral hygiene   Community Resource Referral / Chronic Care Management: CRR required this visit?  No   CCM required this visit?  No     Plan:     I have personally reviewed and noted the following in the patient's chart:   Medical and social history Use of alcohol, tobacco or illicit drugs  Current medications and supplements including opioid prescriptions. Patient is not currently taking opioid prescriptions. Functional ability and status Nutritional status Physical activity Advanced directives List of other physicians Hospitalizations, surgeries, and ER visits in previous 12 months Vitals Screenings to include cognitive, depression, and falls Referrals and appointments  In addition, I have reviewed and discussed with patient certain preventive protocols, quality metrics, and best practice recommendations. A written personalized care plan for preventive services as well as general preventive health recommendations were provided to patient.     Dewayne Ford, LPN   1/61/0960   After Visit Summary: (In Person-Printed) AVS printed and  given to the patient  Nurse Notes: None21

## 2023-05-14 NOTE — Progress Notes (Signed)
   Subjective:    Patient ID: [name] [name], female    DOB: 1950/08/15, 73 y.o.   MRN: [id number]  HPI    Review of Systems     Objective:   Physical Exam        Assessment & Plan:

## 2023-05-14 NOTE — Telephone Encounter (Signed)
 Copied from CRM [id number]. Topic: Clinical - Medication Refill >> May 14, 2023 12:03 PM Truddie Crumble wrote: Most Recent Primary Care Visit:  Provider: Gershon Crane A  Department: [facility]  Visit Type: OFFICE VISIT  Date: 08/21/2022  Medication: metoprolol succinate   Has the patient contacted their pharmacy? Yes (Agent: If no, request that the patient contact the pharmacy for the refill. If patient does not wish to contact the pharmacy document the reason why and proceed with request.) (Agent: If yes, when and what did the pharmacy advise?)  Is this the correct pharmacy for this prescription? Yes If no, delete pharmacy and type the correct one.  This is the patient's preferred pharmacy:  [id number] [place], [place] - [address] [place] [address] Phone: [phone] Fax: [phone]   Has the prescription been filled recently? Yes  Is the patient out of the medication? No  Has the patient been seen for an appointment in the last year OR does the patient have an upcoming appointment? Yes  Can we respond through MyChart? Yes  Agent: Please be advised that Rx refills may take up to 3 business days. We ask that you follow-up with your pharmacy.

## 2023-05-21 ENCOUNTER — Ambulatory Visit (INDEPENDENT_AMBULATORY_CARE_PROVIDER_SITE_OTHER): Payer: Medicare Other | Admitting: Family Medicine

## 2023-05-21 ENCOUNTER — Encounter: Payer: Self-pay | Admitting: Family Medicine

## 2023-05-21 VITALS — BP 132/82 | HR 66 | Temp 98.2°F | Ht 60.0 in | Wt 191.0 lb

## 2023-05-21 DIAGNOSIS — G8929 Other chronic pain: Secondary | ICD-10-CM | POA: Diagnosis not present

## 2023-05-21 DIAGNOSIS — M5441 Lumbago with sciatica, right side: Secondary | ICD-10-CM | POA: Diagnosis not present

## 2023-05-21 DIAGNOSIS — I10 Essential (primary) hypertension: Secondary | ICD-10-CM

## 2023-05-21 DIAGNOSIS — M150 Primary generalized (osteo)arthritis: Secondary | ICD-10-CM

## 2023-05-21 DIAGNOSIS — M5442 Lumbago with sciatica, left side: Secondary | ICD-10-CM

## 2023-05-21 DIAGNOSIS — E782 Mixed hyperlipidemia: Secondary | ICD-10-CM | POA: Diagnosis not present

## 2023-05-21 DIAGNOSIS — R739 Hyperglycemia, unspecified: Secondary | ICD-10-CM | POA: Diagnosis not present

## 2023-05-21 DIAGNOSIS — R413 Other amnesia: Secondary | ICD-10-CM

## 2023-05-21 DIAGNOSIS — I25119 Atherosclerotic heart disease of native coronary artery with unspecified angina pectoris: Secondary | ICD-10-CM | POA: Diagnosis not present

## 2023-05-21 DIAGNOSIS — F321 Major depressive disorder, single episode, moderate: Secondary | ICD-10-CM

## 2023-05-21 DIAGNOSIS — K219 Gastro-esophageal reflux disease without esophagitis: Secondary | ICD-10-CM

## 2023-05-21 DIAGNOSIS — F411 Generalized anxiety disorder: Secondary | ICD-10-CM

## 2023-05-21 DIAGNOSIS — M069 Rheumatoid arthritis, unspecified: Secondary | ICD-10-CM | POA: Diagnosis not present

## 2023-05-21 DIAGNOSIS — Z1211 Encounter for screening for malignant neoplasm of colon: Secondary | ICD-10-CM

## 2023-05-21 DIAGNOSIS — J4489 Other specified chronic obstructive pulmonary disease: Secondary | ICD-10-CM | POA: Diagnosis not present

## 2023-05-21 LAB — LIPID PANEL
Cholesterol: 220 mg/dL — ABNORMAL HIGH (ref 0–200)
HDL: 63.5 mg/dL (ref >39.00)
LDL Cholesterol: 119 mg/dL — ABNORMAL HIGH (ref 0–99)
NonHDL: 156.5
Total CHOL/HDL Ratio: 3
Triglycerides: 188 mg/dL — ABNORMAL HIGH (ref 0.0–149.0)
VLDL: 37.6 mg/dL (ref 0.0–40.0)

## 2023-05-21 LAB — CBC WITH DIFFERENTIAL/PLATELET
Basophils Absolute: 0.1 10*3/uL (ref 0.0–0.1)
Basophils Relative: 1.1 % (ref 0.0–3.0)
Eosinophils Absolute: 0.2 10*3/uL (ref 0.0–0.7)
Eosinophils Relative: 2.6 % (ref 0.0–5.0)
HCT: 46.2 % — ABNORMAL HIGH (ref 36.0–46.0)
Hemoglobin: 15.2 g/dL — ABNORMAL HIGH (ref 12.0–15.0)
Lymphocytes Relative: 29.3 % (ref 12.0–46.0)
Lymphs Abs: 2.4 10*3/uL (ref 0.7–4.0)
MCHC: 33 g/dL (ref 30.0–36.0)
MCV: 95.3 fL (ref 78.0–100.0)
Monocytes Absolute: 0.5 10*3/uL (ref 0.1–1.0)
Monocytes Relative: 5.8 % (ref 3.0–12.0)
Neutro Abs: 5 10*3/uL (ref 1.4–7.7)
Neutrophils Relative %: 61.2 % (ref 43.0–77.0)
Platelets: 345 10*3/uL (ref 150.0–400.0)
RBC: 4.84 Mil/uL (ref 3.87–5.11)
RDW: 13.4 % (ref 11.5–15.5)
WBC: 8.2 10*3/uL (ref 4.0–10.5)

## 2023-05-21 LAB — HEPATIC FUNCTION PANEL
ALT: 10 U/L (ref 0–35)
AST: 16 U/L (ref 0–37)
Albumin: 4.5 g/dL (ref 3.5–5.2)
Alkaline Phosphatase: 59 U/L (ref 39–117)
Bilirubin, Direct: 0.1 mg/dL (ref 0.0–0.3)
Total Bilirubin: 0.5 mg/dL (ref 0.2–1.2)
Total Protein: 7.6 g/dL (ref 6.0–8.3)

## 2023-05-21 LAB — HEMOGLOBIN A1C: Hgb A1c MFr Bld: 5.8 % (ref 4.6–6.5)

## 2023-05-21 LAB — BASIC METABOLIC PANEL
BUN: 23 mg/dL (ref 6–23)
CO2: 29 meq/L (ref 19–32)
Calcium: 9.3 mg/dL (ref 8.4–10.5)
Chloride: 102 meq/L (ref 96–112)
Creatinine, Ser: 1.23 mg/dL — ABNORMAL HIGH (ref 0.40–1.20)
GFR: 43.36 mL/min — ABNORMAL LOW (ref >60.00)
Glucose, Bld: 112 mg/dL — ABNORMAL HIGH (ref 70–99)
Potassium: 4.7 meq/L (ref 3.5–5.1)
Sodium: 141 meq/L (ref 135–145)

## 2023-05-21 LAB — TSH: TSH: 1.38 u[IU]/mL (ref 0.35–5.50)

## 2023-05-21 MED ORDER — ARIPIPRAZOLE 5 MG PO TABS: 5.0000 mg | ORAL_TABLET | Freq: Every day | ORAL | 5 refills | Status: DC

## 2023-05-21 MED ORDER — TEMAZEPAM 15 MG PO CAPS: 15.0000 mg | ORAL_CAPSULE | Freq: Every evening | ORAL | 2 refills | Status: DC | PRN

## 2023-05-21 NOTE — Progress Notes (Signed)
Subjective:    Patient ID: [name] [name], female    DOB: 1950/08/05, 73 y.o.   MRN: [id number]  HPI Here to follow up on issues. Several weeks ago we started her on Abilify 2 mg daily for her depression. She thinks this has helped a little, and so far she denies any side effects. She still has trouble sleeping however. She has tried Zolpidem and Trazodone in the past with poor results. Otherwise her BP is stable. Her OA is stable. Her COPD is stable. She is past due for a mammogram.    Review of Systems  Constitutional: Negative.   HENT: Negative.    Eyes: Negative.   Respiratory: Negative.    Cardiovascular: Negative.   Gastrointestinal: Negative.   Genitourinary:  Negative for decreased urine volume, difficulty urinating, dyspareunia, dysuria, enuresis, flank pain, frequency, hematuria, pelvic pain and urgency.  Musculoskeletal:  Positive for arthralgias and back pain.  Skin: Negative.   Neurological: Negative.  Negative for headaches.  Psychiatric/Behavioral:  Positive for dysphoric mood and sleep disturbance. The patient is nervous/anxious.        Objective:   Physical Exam Constitutional:      General: She is not in acute distress.    Appearance: Normal appearance. She is well-developed.  HENT:     Head: Normocephalic and atraumatic.     Right Ear: External ear normal.     Left Ear: External ear normal.     Nose: Nose normal.     Mouth/Throat:     Pharynx: No oropharyngeal exudate.  Eyes:     General: No scleral icterus.    Conjunctiva/sclera: Conjunctivae normal.     Pupils: Pupils are equal, round, and reactive to light.  Neck:     Thyroid: No thyromegaly.     Vascular: No JVD.  Cardiovascular:     Rate and Rhythm: Normal rate and regular rhythm.     Pulses: Normal pulses.     Heart sounds: Normal heart sounds. No murmur heard.    No friction rub. No gallop.  Pulmonary:     Effort: Pulmonary effort is normal. No respiratory distress.     Breath sounds:  Normal breath sounds. No wheezing or rales.  Chest:     Chest wall: No tenderness.  Abdominal:     General: Bowel sounds are normal. There is no distension.     Palpations: Abdomen is soft. There is no mass.     Tenderness: There is no abdominal tenderness. There is no guarding or rebound.  Musculoskeletal:        General: No tenderness. Normal range of motion.     Cervical back: Normal range of motion and neck supple.  Lymphadenopathy:     Cervical: No cervical adenopathy.  Skin:    General: Skin is warm and dry.     Findings: No erythema or rash.  Neurological:     General: No focal deficit present.     Mental Status: She is alert and oriented to person, place, and time.     Cranial Nerves: No cranial nerve deficit.     Motor: No abnormal muscle tone.     Coordination: Coordination normal.     Deep Tendon Reflexes: Reflexes are normal and symmetric. Reflexes normal.  Psychiatric:        Mood and Affect: Mood normal.        Behavior: Behavior normal.        Thought Content: Thought content normal.  Judgment: Judgment normal.           Assessment & Plan:  Her HTN and CAD and COPD are stable. Her RA and OA are stable. For her depression, we will increase the Abilify to 5 mg daily. For sleep she will try temazepam 15 mg at bedtime. We will get fasting labs to check lipids, etc. I urged her to set up another mammogram. Since she declines to have any more colonoscopies, we will order a Cologuard test.  Gershon Crane, MD

## 2023-05-28 ENCOUNTER — Telehealth: Payer: Self-pay

## 2023-05-28 NOTE — Telephone Encounter (Signed)
 Copied from CRM [id number]. Topic: General - Other >> May 28, 2023  1:51 PM Truddie Crumble wrote: Reason for CRM: patient called stating she has a colorguard kit and the instructions say the stool can not be loose or wet, but her bowel is not hard so she would like to know what to do

## 2023-05-29 NOTE — Telephone Encounter (Signed)
 Spoke with pt advised per Dr Clent Ridges to cut down the fluid intake for a few days until the bowel form and then resume

## 2023-06-03 ENCOUNTER — Other Ambulatory Visit: Payer: Self-pay | Admitting: Cardiovascular Disease

## 2023-06-04 ENCOUNTER — Encounter: Payer: Self-pay | Admitting: Cardiovascular Disease

## 2023-06-04 ENCOUNTER — Ambulatory Visit: Payer: Medicare Other | Attending: Cardiovascular Disease | Admitting: Cardiovascular Disease

## 2023-06-04 VITALS — BP 140/84 | HR 72 | Ht 60.0 in | Wt 194.2 lb

## 2023-06-04 DIAGNOSIS — I10 Essential (primary) hypertension: Secondary | ICD-10-CM

## 2023-06-04 DIAGNOSIS — I493 Ventricular premature depolarization: Secondary | ICD-10-CM | POA: Diagnosis not present

## 2023-06-04 DIAGNOSIS — I25118 Atherosclerotic heart disease of native coronary artery with other forms of angina pectoris: Secondary | ICD-10-CM | POA: Diagnosis not present

## 2023-06-04 DIAGNOSIS — I359 Nonrheumatic aortic valve disorder, unspecified: Secondary | ICD-10-CM

## 2023-06-04 MED ORDER — NITROGLYCERIN 0.4 MG SL SUBL: 0.4000 mg | SUBLINGUAL_TABLET | SUBLINGUAL | 3 refills | Status: AC | PRN

## 2023-06-04 MED ORDER — ISOSORBIDE MONONITRATE ER 30 MG PO TB24: 30.0000 mg | ORAL_TABLET | Freq: Every day | ORAL | 3 refills | Status: DC

## 2023-06-04 NOTE — Progress Notes (Signed)
 Chief Complaint  Patient presents with   Follow-up    CAD   History of Present Illness: 73 yo female with history of CAD, HTN, HLD, GERD, OA, depression, ongoing tobacco abuse, COPD, palpitations/PVCs and possible bicuspid aortic valve who is here today for cardiac follow up. I saw her in May 2015 for evaluation of dyspnea, weakness. She had been on a beta blocker for years for palpitations. Echo June 2015 showed normal LV size and function, bicuspid aortic valve, mildly dilated aortic root. Due to a shadow in the aorta, a CTA chest was arranged to exclude dissection. No dissection noted. Event monitor showed sinus rhythm with PVCs, bigeminy. Inderal changed to metoprolol. Cardiac cath January 2016 showed moderate mid LAD stenosis which was not flow limiting by FFR. The Circumflex and RCA had mild disease. I saw her in March 2016 and she c/o palpitations associated with dizziness and weakness, bradycardia. She was seen by Dr. Johney Frame and was started on Flecainide. She has tolerated metoprolol and low dose Flecainide. Repeat cardiac cath in September 2017 with 50% mid LAD stenosis, mild disease in the RCA and Circumflex. She had a home sleep study in March 2018 and she was told it was normal. Echo November 2020 with LVEF=60-65%, no valve insufficiency or stenosis. Possible bicuspid aortic valve noted by prior echo.   She is here today for follow up. She continues to have dyspnea with minimal exertion. She has rare episodes of chest pain. Her long term partner died last week. She has been upset about this. No palpitations, lower extremity edema, orthopnea, PND, dizziness, near syncope or syncope.   Primary Care Physician: [name], MD  Past Medical History:  Diagnosis Date   Allergic rhinitis    ALLERGIC RHINITIS 05/08/2007   Qualifier: Diagnosis of  By: Linna Darner, CMA, Cindy     Allergy    seasonal   Anxiety    Anxiety state 05/25/2014   Aortic valve disease    a. ? possible bicuspid AV.    CAD (coronary artery disease)    a. nonobstructive by prior cath   Cataract    bilateral   Chronic low back pain 03/09/2016   Complication of anesthesia 2003   Medication for block "went up to my brain and I stopped breathing"   COPD (chronic obstructive pulmonary disease) (HCC)    COPD (chronic obstructive pulmonary disease) with chronic bronchitis (HCC) 10/12/2014   Depression    Depression, major, single episode, moderate (HCC) 05/08/2007   Qualifier: Diagnosis of  By: Linna Darner, CMA, Cindy     Dexamethasone adverse reaction    2002 normal   Dilated aortic root (HCC)    a. mild dilated aortic root on prior echo, ascending aorta prominence on CT 2015.   Duodenal mass    GERD 05/08/2007   Qualifier: Diagnosis of  By: Linna Darner, CMA, Cindy     Hyperlipidemia    Hypertension    Insomnia    Neck pain, chronic 03/09/2016   NICOTINE ADDICTION 05/08/2007   Qualifier: Diagnosis of  By: Clent Ridges MD, Tera Mater    Osteoarthritis    Overactive bladder    Pneumonia    once or twivce in past   Premature ventricular contractions    LBBB inferior axis PVCs   Thoracic back pain 05/30/2014   Tobacco abuse    Ulcer    duodenal    Past Surgical History:  Procedure Laterality Date   BREAST BIOPSY     CARDIAC CATHETERIZATION N/A 12/03/2015  Procedure: Left Heart Cath and Coronary Angiography;  Surgeon: Kathleene Hazel, MD;  Location: [facility] INVASIVE CV LAB;  Service: Cardiovascular;  Laterality: N/A;   CARPAL TUNNEL RELEASE Left    1997 left,     CHOLECYSTECTOMY N/A 03/23/2015   Procedure: LAPAROSCOPIC CHOLECYSTECTOMY;  Surgeon: Axel Filler, MD;  Location: [facility] OR;  Service: General;  Laterality: N/A;   COLONOSCOPY  10/17/2013   per Dr. Juanda Chance, benign polyps, repeat in 10 years   EUS N/A 11/12/2014   Procedure: UPPER ENDOSCOPIC ULTRASOUND (EUS) LINEAR;  Surgeon: Rachael Fee, MD;  Location: [facility] ENDOSCOPY;  Service: Endoscopy;  Laterality: N/A;   EUS N/A 06/08/2016   Procedure: UPPER ENDOSCOPIC  ULTRASOUND (EUS) RADIAL;  Surgeon: Rachael Fee, MD;  Location: [facility] ENDOSCOPY;  Service: Endoscopy;  Laterality: N/A;   EYE SURGERY     LEFT AND RIGHT HEART CATHETERIZATION WITH CORONARY ANGIOGRAM N/A 04/22/2014   Procedure: LEFT AND RIGHT HEART CATHETERIZATION WITH CORONARY ANGIOGRAM;  Surgeon: Kathleene Hazel, MD;  Location: [facility] CATH LAB;  Service: Cardiovascular;  Laterality: N/A;   TONSILLECTOMY  1950   torn cartilage repaired rt wrist Right 2003    Current Outpatient Medications  Medication Sig Dispense Refill   albuterol (PROAIR HFA) 108 (90 Base) MCG/ACT inhaler Inhale 2 puffs into the lungs every 6 (six) hours as needed for wheezing. 8.5 each 11   ALPRAZolam (XANAX) 0.25 MG tablet TAKE 3 TABLETS BY MOUTH EVERY 6 HOURS AS NEEDED 360 tablet 1   amLODipine (NORVASC) 2.5 MG tablet Take 2 tablets (5 mg total) by mouth daily.     ARIPiprazole (ABILIFY) 5 MG tablet Take 1 tablet (5 mg total) by mouth daily. 30 tablet 5   aspirin EC 81 MG tablet Take 1 tablet (81 mg total) by mouth daily. 90 tablet 3   cetirizine (ZYRTEC) 10 MG tablet Take 10 mg by mouth daily as needed for allergies.      cholecalciferol (VITAMIN D3) 25 MCG (1000 UNIT) tablet Take 1,000 Units by mouth daily.     flecainide (TAMBOCOR) 50 MG tablet Take 1 tablet (50 mg total) by mouth 3 (three) times daily. 90 tablet 1   isosorbide mononitrate (IMDUR) 30 MG 24 hr tablet Take 1 tablet (30 mg total) by mouth daily. 90 tablet 3   lisinopril (ZESTRIL) 10 MG tablet Take 10 mg by mouth daily.     magnesium oxide (MAG-OX) 400 (240 Mg) MG tablet Take 1 tablet by mouth daily.     metoprolol succinate (TOPROL-XL) 50 MG 24 hr tablet TAKE 1 TABLET BY MOUTH DAILY. TAKE WITH OR IMMEDIATELY FOLLOWING A MEAL. 90 tablet 1   temazepam (RESTORIL) 15 MG capsule Take 1 capsule (15 mg total) by mouth at bedtime as needed for sleep. 30 capsule 2   nitroGLYCERIN (NITROSTAT) 0.4 MG SL tablet Place 1 tablet (0.4 mg total) under the tongue every  5 (five) minutes as needed for chest pain. 25 tablet 3   No current facility-administered medications for this visit.    Allergies  Allergen Reactions   Augmentin [Amoxicillin-Pot Clavulanate] Nausea And Vomiting   Clindamycin/Lincomycin Nausea And Vomiting   Colchicine Diarrhea   Flecainide Other (See Comments)    Conjunctivitis    Hydroxychloroquine Rash    Social History   Socioeconomic History   Marital status: Single    Spouse name: Not on file   Number of children: 0   Years of education: Not on file   Highest education level: Not on file  Occupational History   Occupation: CNA  Tobacco Use   Smoking status: Former    Current packs/day: 0.00    Average packs/day: 0.5 packs/day for 54.0 years (24.3 ttl pk-yrs)    Types: Cigarettes    Start date: 01/02/1963    Quit date: 01/01/2017    Years since quitting: 6.4   Smokeless tobacco: Never   Tobacco comments:    1/2 pack per day or less  Vaping Use   Vaping status: Never Used  Substance and Sexual Activity   Alcohol use: No    Alcohol/week: 0.0 standard drinks of alcohol   Drug use: No   Sexual activity: Not on file  Other Topics Concern   Not on file  Social History Narrative   Works as Lawyer at [employer].   Social Drivers of Corporate investment banker Strain: Low Risk  (05/14/2023)   Overall Financial Resource Strain (CARDIA)    Difficulty of Paying Living Expenses: Not hard at all  Food Insecurity: No Food Insecurity (05/14/2023)   Hunger Vital Sign    Worried About Running Out of Food in the Last Year: Never true    Ran Out of Food in the Last Year: Never true  Transportation Needs: No Transportation Needs (05/14/2023)   PRAPARE - Administrator, Civil Service (Medical): No    Lack of Transportation (Non-Medical): No  Physical Activity: Inactive (05/14/2023)   Exercise Vital Sign    Days of Exercise per Week: 0 days    Minutes of Exercise per Session: 0 min  Stress: No Stress Concern  Present (05/14/2023)   [employer] of Occupational Health - Occupational Stress Questionnaire    Feeling of Stress : Not at all  Social Connections: Socially Isolated (05/14/2023)   Social Connection and Isolation Panel [NHANES]    Frequency of Communication with Friends and Family: More than three times a week    Frequency of Social Gatherings with Friends and Family: Three times a week    Attends Religious Services: Never    Active Member of Clubs or Organizations: No    Attends Banker Meetings: Never    Marital Status: Never married  Intimate Partner Violence: Not At Risk (05/14/2023)   Humiliation, Afraid, Rape, and Kick questionnaire    Fear of Current or Ex-Partner: No    Emotionally Abused: No    Physically Abused: No    Sexually Abused: No    Family History  Problem Relation Age of Onset   CAD Brother    Kidney disease Brother    Emphysema Mother    Other Father        brain tumor   Throat cancer Brother    Hyperlipidemia Other    Hypertension Other    Kidney disease Other    Heart disease Other    Colon cancer Neg Hx    Esophageal cancer Neg Hx    Stomach cancer Neg Hx    Rectal cancer Neg Hx     Review of Systems:  As stated in the HPI and otherwise negative.   BP (!) 140/84   Pulse 72   Ht 5' (1.524 m)   Wt 88.1 kg   SpO2 90%   BMI 37.93 kg/m   Physical Examination: General: Well developed, well nourished, NAD  HEENT: OP clear, mucus membranes moist  SKIN: warm, dry. No rashes. Neuro: No focal deficits  Musculoskeletal: Muscle strength 5/5 all ext  Psychiatric: Mood and affect normal  Neck:  No JVD Lungs:Clear bilaterally, no wheezes, rhonci, crackles Cardiovascular: Regular rate and rhythm. No murmurs, gallops or rubs. Abdomen:Soft. Bowel sounds present. Non-tender.  Extremities: No lower extremity edema.   EKG:  EKG is ordered today. The ekg ordered today demonstrates  EKG Interpretation Date/Time:  Monday June 04 2023  16:04:23 EST Ventricular Rate:  72 PR Interval:  212 QRS Duration:  84 QT Interval:  418 QTC Calculation: 457 R Axis:   2  Text Interpretation: Sinus rhythm with 1st degree A-V block Nonspecific T wave abnormality Confirmed by Verne Carrow [id number]) on 06/04/2023 4:31:48 PM   Recent Labs: 05/21/2023: ALT 10; BUN 23; Creatinine, Ser 1.23; Hemoglobin 15.2; Platelets 345.0; Potassium 4.7; Sodium 141; TSH 1.38   Lipid Panel    Component Value Date/Time   CHOL 220 (H) 05/21/2023 1408   TRIG 188.0 (H) 05/21/2023 1408   HDL 63.50 05/21/2023 1408   CHOLHDL 3 05/21/2023 1408   VLDL 37.6 05/21/2023 1408   LDLCALC 119 (H) 05/21/2023 1408   LDLDIRECT 130.9 11/16/2008 1013   Wt Readings from Last 3 Encounters:  06/04/23 88.1 kg  05/21/23 86.6 kg  05/14/23 87.1 kg    Assessment and Plan:   1. PVCs: Rare palpitations. Will continue Flecainide and metoprolol.     2. CAD with stable angina: She is known to have moderate disease in the LAD, not felt to be flow limiting by cath in September 2017.  She has rare chest pains. It is unclear if her dyspnea is related to her COPD or if it is anginal equivalent. I have offered to arrange a cardiac cath but she refuses at this time. She has no social support in [place]. Continue ASA, beta blocker and statin. I will add Imdur 30 mg daily. She will call EMS if she has severe pain at home.   3. Tobacco abuse, in remission: She stopped smoking in 2018.  4. Bicuspid aortic valve: No stenosis by echo in November 2020. I have discussed repeating an echo but she does not wish to do this at this time.     5. HTN: BP is well controlled. No changes today. Continue current therapy.    Labs/ tests ordered today include:   Orders Placed This Encounter  Procedures   EKG 12-Lead   Disposition:   F/U with me in 12 months  Signed, Verne Carrow, MD 06/04/2023 4:48 PM    [facility] [address] [place], [place], [place]   [address] Phone: [phone]; Fax: [phone]

## 2023-06-04 NOTE — Patient Instructions (Signed)
 Medication Instructions:  Your physician has recommended you make the following change in your medication:  1.) start isosorbide (IMDUR) 30 mg - take one tablet daily  *If you need a refill on your cardiac medications before your next appointment, please call your pharmacy*   Lab Work: none   Testing/Procedures: none   Follow-Up: At [facility], you and your health needs are our priority.  As part of our continuing mission to provide you with exceptional heart care, we have created designated Provider Care Teams.  These Care Teams include your primary Cardiologist (physician) and Advanced Practice Providers (APPs -  Physician Assistants and Nurse Practitioners) who all work together to provide you with the care you need, when you need it.   Your next appointment:   10 week(s)  Provider:   Verne Carrow, MD

## 2023-06-05 ENCOUNTER — Telehealth: Payer: Self-pay | Admitting: Family Medicine

## 2023-06-05 DIAGNOSIS — J4489 Other specified chronic obstructive pulmonary disease: Secondary | ICD-10-CM

## 2023-06-05 MED ORDER — AMLODIPINE BESYLATE 5 MG PO TABS: 5.0000 mg | ORAL_TABLET | Freq: Every day | ORAL | 5 refills | Status: DC

## 2023-06-05 NOTE — Telephone Encounter (Signed)
 Done

## 2023-06-05 NOTE — Telephone Encounter (Signed)
 Copied from CRM [id number]. Topic: Referral - Request for Referral >> Jun 05, 2023 10:57 AM Corin V wrote: Did the patient discuss referral with their provider in the last year? Yes (If No - schedule appointment) (If Yes - send message)  Appointment offered? No  Type of order/referral and detailed reason for visit: Pulmonology- COPD/ shortness of breath -has been experiencing shortness of breath and declined to speak to Nurse Triage but Cardiologist recommended she see pulmonary care again.  Preference of office, provider, location[facility] [address]  If referral order, have you been seen by this specialty before? Yes (If Yes, this issue or another issue? When? Where?)- Dr. Alroy Dust, [facility] in 2016  Can we respond through MyChart? No

## 2023-06-05 NOTE — Addendum Note (Signed)
 Addended by: Gershon Crane A on: 06/05/2023 04:47 PM   Modules accepted: Orders

## 2023-06-05 NOTE — Telephone Encounter (Signed)
The referral is done

## 2023-06-05 NOTE — Addendum Note (Signed)
 Addended by: Gershon Crane A on: 06/05/2023 04:49 PM   Modules accepted: Orders

## 2023-06-05 NOTE — Telephone Encounter (Signed)
 Copied from CRM [id number]. Topic: Clinical - Medication Refill >> Jun 05, 2023 10:50 AM Corin V wrote: Most Recent Primary Care Visit:  Provider: Gershon Crane A  Department: [facility]  Visit Type: PHYSICAL  Date: 05/21/2023  Medication: amLODipine (NORVASC) 2.5 MG tablet- Per Dr. Clent Ridges needs to be upped to 5mg   Has the patient contacted their pharmacy? No (Agent: If no, request that the patient contact the pharmacy for the refill. If patient does not wish to contact the pharmacy document the reason why and proceed with request.) (Agent: If yes, when and what did the pharmacy advise?)  Is this the correct pharmacy for this prescription? Yes If no, delete pharmacy and type the correct one.  This is the patient's preferred pharmacy:  [id number] [place], [place] - [address] [place] [address] Phone: [phone] Fax: [phone]   Has the prescription been filled recently? No  Is the patient out of the medication? Yes  Has the patient been seen for an appointment in the last year OR does the patient have an upcoming appointment? No  Can we respond through MyChart? No  Agent: Please be advised that Rx refills may take up to 3 business days. We ask that you follow-up with your pharmacy.

## 2023-06-05 NOTE — Telephone Encounter (Signed)
 Ok for referral?

## 2023-06-06 NOTE — Telephone Encounter (Signed)
Pt notified that referral has been placed

## 2023-06-12 ENCOUNTER — Other Ambulatory Visit: Payer: Self-pay | Admitting: Family Medicine

## 2023-06-18 ENCOUNTER — Emergency Department (HOSPITAL_COMMUNITY)

## 2023-06-18 ENCOUNTER — Encounter (HOSPITAL_COMMUNITY): Payer: Self-pay

## 2023-06-18 ENCOUNTER — Inpatient Hospital Stay (HOSPITAL_COMMUNITY)
Admission: EM | Admit: 2023-06-18 | Discharge: 2023-07-06 | DRG: 286 | Disposition: A | Discharge status: Skilled Nursing Facility | Attending: Internal Medicine | Admitting: Internal Medicine

## 2023-06-18 ENCOUNTER — Other Ambulatory Visit: Payer: Self-pay

## 2023-06-18 ENCOUNTER — Observation Stay (HOSPITAL_COMMUNITY)

## 2023-06-18 DIAGNOSIS — Z66 Do not resuscitate: Secondary | ICD-10-CM | POA: Diagnosis present

## 2023-06-18 DIAGNOSIS — J90 Pleural effusion, not elsewhere classified: Secondary | ICD-10-CM | POA: Diagnosis not present

## 2023-06-18 DIAGNOSIS — N1831 Chronic kidney disease, stage 3a: Secondary | ICD-10-CM | POA: Diagnosis present

## 2023-06-18 DIAGNOSIS — R0602 Shortness of breath: Secondary | ICD-10-CM | POA: Diagnosis not present

## 2023-06-18 DIAGNOSIS — I509 Heart failure, unspecified: Secondary | ICD-10-CM | POA: Diagnosis not present

## 2023-06-18 DIAGNOSIS — I2729 Other secondary pulmonary hypertension: Secondary | ICD-10-CM | POA: Diagnosis not present

## 2023-06-18 DIAGNOSIS — Z8249 Family history of ischemic heart disease and other diseases of the circulatory system: Secondary | ICD-10-CM | POA: Diagnosis not present

## 2023-06-18 DIAGNOSIS — I5033 Acute on chronic diastolic (congestive) heart failure: Secondary | ICD-10-CM | POA: Insufficient documentation

## 2023-06-18 DIAGNOSIS — Z1152 Encounter for screening for COVID-19: Secondary | ICD-10-CM | POA: Diagnosis not present

## 2023-06-18 DIAGNOSIS — R6889 Other general symptoms and signs: Secondary | ICD-10-CM | POA: Diagnosis not present

## 2023-06-18 DIAGNOSIS — I10 Essential (primary) hypertension: Secondary | ICD-10-CM | POA: Diagnosis not present

## 2023-06-18 DIAGNOSIS — J9621 Acute and chronic respiratory failure with hypoxia: Secondary | ICD-10-CM | POA: Diagnosis not present

## 2023-06-18 DIAGNOSIS — F321 Major depressive disorder, single episode, moderate: Secondary | ICD-10-CM | POA: Diagnosis not present

## 2023-06-18 DIAGNOSIS — R5381 Other malaise: Secondary | ICD-10-CM | POA: Diagnosis present

## 2023-06-18 DIAGNOSIS — I130 Hypertensive heart and chronic kidney disease with heart failure and stage 1 through stage 4 chronic kidney disease, or unspecified chronic kidney disease: Secondary | ICD-10-CM | POA: Diagnosis not present

## 2023-06-18 DIAGNOSIS — Z6838 Body mass index (BMI) 38.0-38.9, adult: Secondary | ICD-10-CM

## 2023-06-18 DIAGNOSIS — I359 Nonrheumatic aortic valve disorder, unspecified: Secondary | ICD-10-CM | POA: Diagnosis present

## 2023-06-18 DIAGNOSIS — N1832 Chronic kidney disease, stage 3b: Secondary | ICD-10-CM | POA: Diagnosis not present

## 2023-06-18 DIAGNOSIS — I959 Hypotension, unspecified: Secondary | ICD-10-CM | POA: Diagnosis not present

## 2023-06-18 DIAGNOSIS — N3281 Overactive bladder: Secondary | ICD-10-CM | POA: Diagnosis present

## 2023-06-18 DIAGNOSIS — Z881 Allergy status to other antibiotic agents status: Secondary | ICD-10-CM

## 2023-06-18 DIAGNOSIS — I2489 Other forms of acute ischemic heart disease: Secondary | ICD-10-CM | POA: Diagnosis not present

## 2023-06-18 DIAGNOSIS — Z9181 History of falling: Secondary | ICD-10-CM

## 2023-06-18 DIAGNOSIS — E669 Obesity, unspecified: Secondary | ICD-10-CM | POA: Diagnosis present

## 2023-06-18 DIAGNOSIS — R918 Other nonspecific abnormal finding of lung field: Secondary | ICD-10-CM | POA: Diagnosis not present

## 2023-06-18 DIAGNOSIS — Z880 Allergy status to penicillin: Secondary | ICD-10-CM

## 2023-06-18 DIAGNOSIS — I5082 Biventricular heart failure: Secondary | ICD-10-CM | POA: Diagnosis present

## 2023-06-18 DIAGNOSIS — G4700 Insomnia, unspecified: Secondary | ICD-10-CM | POA: Diagnosis not present

## 2023-06-18 DIAGNOSIS — J9601 Acute respiratory failure with hypoxia: Secondary | ICD-10-CM | POA: Diagnosis present

## 2023-06-18 DIAGNOSIS — I7121 Aneurysm of the ascending aorta, without rupture: Secondary | ICD-10-CM | POA: Diagnosis not present

## 2023-06-18 DIAGNOSIS — I2510 Atherosclerotic heart disease of native coronary artery without angina pectoris: Secondary | ICD-10-CM | POA: Diagnosis not present

## 2023-06-18 DIAGNOSIS — Z8711 Personal history of peptic ulcer disease: Secondary | ICD-10-CM

## 2023-06-18 DIAGNOSIS — Z79899 Other long term (current) drug therapy: Secondary | ICD-10-CM

## 2023-06-18 DIAGNOSIS — M159 Polyosteoarthritis, unspecified: Secondary | ICD-10-CM | POA: Diagnosis present

## 2023-06-18 DIAGNOSIS — N179 Acute kidney failure, unspecified: Secondary | ICD-10-CM | POA: Diagnosis present

## 2023-06-18 DIAGNOSIS — J4489 Other specified chronic obstructive pulmonary disease: Secondary | ICD-10-CM | POA: Diagnosis present

## 2023-06-18 DIAGNOSIS — G4733 Obstructive sleep apnea (adult) (pediatric): Secondary | ICD-10-CM | POA: Diagnosis not present

## 2023-06-18 DIAGNOSIS — I700 Atherosclerosis of aorta: Secondary | ICD-10-CM | POA: Diagnosis not present

## 2023-06-18 DIAGNOSIS — R531 Weakness: Secondary | ICD-10-CM | POA: Diagnosis not present

## 2023-06-18 DIAGNOSIS — Z825 Family history of asthma and other chronic lower respiratory diseases: Secondary | ICD-10-CM

## 2023-06-18 DIAGNOSIS — R339 Retention of urine, unspecified: Secondary | ICD-10-CM | POA: Diagnosis present

## 2023-06-18 DIAGNOSIS — Z808 Family history of malignant neoplasm of other organs or systems: Secondary | ICD-10-CM

## 2023-06-18 DIAGNOSIS — I25119 Atherosclerotic heart disease of native coronary artery with unspecified angina pectoris: Secondary | ICD-10-CM | POA: Diagnosis not present

## 2023-06-18 DIAGNOSIS — F411 Generalized anxiety disorder: Secondary | ICD-10-CM | POA: Diagnosis present

## 2023-06-18 DIAGNOSIS — I110 Hypertensive heart disease with heart failure: Secondary | ICD-10-CM | POA: Diagnosis not present

## 2023-06-18 DIAGNOSIS — Z841 Family history of disorders of kidney and ureter: Secondary | ICD-10-CM

## 2023-06-18 DIAGNOSIS — Z87891 Personal history of nicotine dependence: Secondary | ICD-10-CM | POA: Diagnosis not present

## 2023-06-18 DIAGNOSIS — E782 Mixed hyperlipidemia: Secondary | ICD-10-CM | POA: Diagnosis present

## 2023-06-18 DIAGNOSIS — R0609 Other forms of dyspnea: Secondary | ICD-10-CM | POA: Diagnosis not present

## 2023-06-18 DIAGNOSIS — J449 Chronic obstructive pulmonary disease, unspecified: Secondary | ICD-10-CM | POA: Diagnosis not present

## 2023-06-18 DIAGNOSIS — Z888 Allergy status to other drugs, medicaments and biological substances status: Secondary | ICD-10-CM

## 2023-06-18 DIAGNOSIS — I493 Ventricular premature depolarization: Secondary | ICD-10-CM | POA: Diagnosis present

## 2023-06-18 DIAGNOSIS — Z7982 Long term (current) use of aspirin: Secondary | ICD-10-CM

## 2023-06-18 LAB — CBC WITH DIFFERENTIAL/PLATELET
Abs Immature Granulocytes: 0.04 10*3/uL (ref 0.00–0.07)
Basophils Absolute: 0 10*3/uL (ref 0.0–0.1)
Basophils Relative: 1 %
Eosinophils Absolute: 0 10*3/uL (ref 0.0–0.5)
Eosinophils Relative: 0 %
HCT: 46 % (ref 36.0–46.0)
Hemoglobin: 14 g/dL (ref 12.0–15.0)
Immature Granulocytes: 1 %
Lymphocytes Relative: 18 %
Lymphs Abs: 1.3 10*3/uL (ref 0.7–4.0)
MCH: 30 pg (ref 26.0–34.0)
MCHC: 30.4 g/dL (ref 30.0–36.0)
MCV: 98.7 fL (ref 80.0–100.0)
Monocytes Absolute: 0.5 10*3/uL (ref 0.1–1.0)
Monocytes Relative: 7 %
Neutro Abs: 5.3 10*3/uL (ref 1.7–7.7)
Neutrophils Relative %: 73 %
Platelets: 304 10*3/uL (ref 150–400)
RBC: 4.66 MIL/uL (ref 3.87–5.11)
RDW: 13.2 % (ref 11.5–15.5)
WBC: 7.2 10*3/uL (ref 4.0–10.5)
nRBC: 0.6 % — ABNORMAL HIGH (ref 0.0–0.2)

## 2023-06-18 LAB — TROPONIN I (HIGH SENSITIVITY)
Troponin I (High Sensitivity): 97 ng/L — ABNORMAL HIGH (ref <18)
Troponin I (High Sensitivity): 104 ng/L (ref <18)

## 2023-06-18 LAB — COMPREHENSIVE METABOLIC PANEL
ALT: 16 U/L (ref 0–44)
AST: 20 U/L (ref 15–41)
Albumin: 3.5 g/dL (ref 3.5–5.0)
Alkaline Phosphatase: 54 U/L (ref 38–126)
Anion gap: 12 (ref 5–15)
BUN: 20 mg/dL (ref 8–23)
CO2: 27 mmol/L (ref 22–32)
Calcium: 8.8 mg/dL — ABNORMAL LOW (ref 8.9–10.3)
Chloride: 97 mmol/L — ABNORMAL LOW (ref 98–111)
Creatinine, Ser: 1.08 mg/dL — ABNORMAL HIGH (ref 0.44–1.00)
GFR, Estimated: 54 mL/min — ABNORMAL LOW (ref >60)
Glucose, Bld: 111 mg/dL — ABNORMAL HIGH (ref 70–99)
Potassium: 4.8 mmol/L (ref 3.5–5.1)
Sodium: 136 mmol/L (ref 135–145)
Total Bilirubin: 0.6 mg/dL (ref 0.0–1.2)
Total Protein: 6.8 g/dL (ref 6.5–8.1)

## 2023-06-18 LAB — RESP PANEL BY RT-PCR (RSV, FLU A&B, COVID)  RVPGX2
Influenza A by PCR: NEGATIVE
Influenza B by PCR: NEGATIVE
Resp Syncytial Virus by PCR: NEGATIVE
SARS Coronavirus 2 by RT PCR: NEGATIVE

## 2023-06-18 LAB — BRAIN NATRIURETIC PEPTIDE: B Natriuretic Peptide: 715.9 pg/mL — ABNORMAL HIGH (ref 0.0–100.0)

## 2023-06-18 MED ORDER — ARIPIPRAZOLE 2 MG PO TABS
2.0000 mg | ORAL_TABLET | Freq: Every day | ORAL | Status: DC
Start: 2023-06-19 — End: 2023-07-06
  Administered 2023-06-19 – 2023-07-06 (×18): 2 mg via ORAL
  Filled 2023-06-18 (×18): qty 1

## 2023-06-18 MED ORDER — SODIUM CHLORIDE 0.9% FLUSH
3.0000 mL | Freq: Two times a day (BID) | INTRAVENOUS | Status: DC
  Administered 2023-06-18 – 2023-07-06 (×26): 3 mL via INTRAVENOUS

## 2023-06-18 MED ORDER — ENOXAPARIN SODIUM 40 MG/0.4ML IJ SOSY
40.0000 mg | PREFILLED_SYRINGE | INTRAMUSCULAR | Status: DC
  Administered 2023-06-18 – 2023-06-19 (×2): 40 mg via SUBCUTANEOUS
  Filled 2023-06-18 (×2): qty 0.4

## 2023-06-18 MED ORDER — POTASSIUM CHLORIDE CRYS ER 10 MEQ PO TBCR
10.0000 meq | EXTENDED_RELEASE_TABLET | Freq: Every day | ORAL | Status: DC
  Administered 2023-06-19 – 2023-06-20 (×2): 10 meq via ORAL
  Filled 2023-06-18 (×2): qty 1

## 2023-06-18 MED ORDER — ONDANSETRON HCL 4 MG PO TABS: 4.0000 mg | ORAL_TABLET | Freq: Four times a day (QID) | ORAL | Status: DC | PRN

## 2023-06-18 MED ORDER — LISINOPRIL 10 MG PO TABS
10.0000 mg | ORAL_TABLET | Freq: Every day | ORAL | Status: DC
  Administered 2023-06-19: 10 mg via ORAL
  Filled 2023-06-18: qty 1

## 2023-06-18 MED ORDER — IPRATROPIUM-ALBUTEROL 0.5-2.5 (3) MG/3ML IN SOLN: 3.0000 mL | Freq: Four times a day (QID) | RESPIRATORY_TRACT | Status: DC | PRN

## 2023-06-18 MED ORDER — SENNOSIDES-DOCUSATE SODIUM 8.6-50 MG PO TABS
1.0000 | ORAL_TABLET | Freq: Every evening | ORAL | Status: DC | PRN
  Administered 2023-06-19 – 2023-06-22 (×2): 1 via ORAL
  Filled 2023-06-18 (×2): qty 1

## 2023-06-18 MED ORDER — ACETAMINOPHEN 650 MG RE SUPP: 650.0000 mg | Freq: Four times a day (QID) | RECTAL | Status: DC | PRN

## 2023-06-18 MED ORDER — IOHEXOL 350 MG/ML SOLN
75.0000 mL | Freq: Once | INTRAVENOUS | Status: AC | PRN
  Administered 2023-06-18: 75 mL via INTRAVENOUS

## 2023-06-18 MED ORDER — FLECAINIDE ACETATE 50 MG PO TABS
50.0000 mg | ORAL_TABLET | Freq: Three times a day (TID) | ORAL | Status: DC
  Administered 2023-06-18 – 2023-06-20 (×5): 50 mg via ORAL
  Filled 2023-06-18 (×7): qty 1

## 2023-06-18 MED ORDER — ONDANSETRON HCL 4 MG/2ML IJ SOLN
4.0000 mg | Freq: Four times a day (QID) | INTRAMUSCULAR | Status: DC | PRN
  Administered 2023-06-19: 4 mg via INTRAVENOUS
  Filled 2023-06-18: qty 2

## 2023-06-18 MED ORDER — BISACODYL 5 MG PO TBEC
5.0000 mg | DELAYED_RELEASE_TABLET | Freq: Every day | ORAL | Status: DC | PRN
  Administered 2023-06-19 – 2023-06-24 (×4): 5 mg via ORAL
  Filled 2023-06-18 (×4): qty 1

## 2023-06-18 MED ORDER — FUROSEMIDE 10 MG/ML IJ SOLN
40.0000 mg | Freq: Two times a day (BID) | INTRAMUSCULAR | Status: DC
  Administered 2023-06-19 – 2023-06-20 (×3): 40 mg via INTRAVENOUS
  Filled 2023-06-18 (×3): qty 4

## 2023-06-18 MED ORDER — ASPIRIN 81 MG PO TBEC
81.0000 mg | DELAYED_RELEASE_TABLET | Freq: Every day | ORAL | Status: DC
  Administered 2023-06-19 – 2023-07-06 (×18): 81 mg via ORAL
  Filled 2023-06-18 (×18): qty 1

## 2023-06-18 MED ORDER — FUROSEMIDE 10 MG/ML IJ SOLN
40.0000 mg | Freq: Once | INTRAMUSCULAR | Status: AC
  Administered 2023-06-18: 40 mg via INTRAVENOUS
  Filled 2023-06-18: qty 4

## 2023-06-18 MED ORDER — METOPROLOL SUCCINATE ER 50 MG PO TB24
50.0000 mg | ORAL_TABLET | Freq: Every day | ORAL | Status: DC
  Administered 2023-06-19: 50 mg via ORAL
  Filled 2023-06-18: qty 2

## 2023-06-18 MED ORDER — ACETAMINOPHEN 325 MG PO TABS
650.0000 mg | ORAL_TABLET | Freq: Four times a day (QID) | ORAL | Status: DC | PRN
  Administered 2023-06-23 – 2023-07-06 (×7): 650 mg via ORAL
  Filled 2023-06-18 (×8): qty 2

## 2023-06-18 MED ORDER — MELATONIN 3 MG PO TABS
3.0000 mg | ORAL_TABLET | Freq: Every evening | ORAL | Status: DC | PRN
  Administered 2023-06-23 – 2023-07-05 (×5): 3 mg via ORAL
  Filled 2023-06-18 (×5): qty 1

## 2023-06-18 MED ORDER — AMLODIPINE BESYLATE 5 MG PO TABS
5.0000 mg | ORAL_TABLET | Freq: Every day | ORAL | Status: DC
  Administered 2023-06-19: 5 mg via ORAL
  Filled 2023-06-18: qty 1

## 2023-06-18 MED ORDER — ALPRAZOLAM 0.25 MG PO TABS: 0.7500 mg | ORAL_TABLET | Freq: Four times a day (QID) | ORAL | Status: DC | PRN

## 2023-06-18 NOTE — TOC CM/SW Note (Signed)
 3/17 COPD;CKD Stage 3;HTN;HLD, Depression and Anxiety. Husband passed 2 weeks ago; lives at home with roommate.

## 2023-06-18 NOTE — ED Provider Notes (Signed)
 [facility] AT [facility] Provider Note  CSN: [id number] Arrival date & time: 06/18/23 1549  Chief Complaint(s) Shortness of Breath  HPI [name] [name] is a 73 y.o. female with PMH COPD, anxiety, GERD, RA, peptic ulcer disease who presents emergency room for evaluation of shortness of breath.  States that over the last 3 days her shortness of breath has been gradually worsening.  Found hypoxic to 86% in the field and placed on 5 L by EMS.  Arrives with tachypnea but denies associated chest pain, abdominal pain, nausea, vomit, headache, fever or other systemic symptoms.   Past Medical History Past Medical History:  Diagnosis Date   Allergic rhinitis    ALLERGIC RHINITIS 05/08/2007   Qualifier: Diagnosis of  By: Linna Darner, CMA, Cindy     Allergy    seasonal   Anxiety    Anxiety state 05/25/2014   Aortic valve disease    a. ? possible bicuspid AV.   CAD (coronary artery disease)    a. nonobstructive by prior cath   Cataract    bilateral   Chronic low back pain 03/09/2016   Complication of anesthesia 2003   Medication for block "went up to my brain and I stopped breathing"   COPD (chronic obstructive pulmonary disease) (HCC)    COPD (chronic obstructive pulmonary disease) with chronic bronchitis (HCC) 10/12/2014   Depression    Depression, major, single episode, moderate (HCC) 05/08/2007   Qualifier: Diagnosis of  By: Linna Darner, CMA, Cindy     Dexamethasone adverse reaction    2002 normal   Dilated aortic root (HCC)    a. mild dilated aortic root on prior echo, ascending aorta prominence on CT 2015.   Duodenal mass    GERD 05/08/2007   Qualifier: Diagnosis of  By: Linna Darner, CMA, Cindy     Hyperlipidemia    Hypertension    Insomnia    Neck pain, chronic 03/09/2016   NICOTINE ADDICTION 05/08/2007   Qualifier: Diagnosis of  By: Clent Ridges MD, Jeannett Senior A    Osteoarthritis    Overactive bladder    Pneumonia    once or twivce in past   Premature ventricular  contractions    LBBB inferior axis PVCs   Thoracic back pain 05/30/2014   Tobacco abuse    Ulcer    duodenal   Patient Active Problem List   Diagnosis Date Noted   Frequent falls 04/24/2022   Memory loss 04/24/2022   RA (rheumatoid arthritis) (HCC) 01/20/2022   Duodenal mass    Neck pain, chronic 03/09/2016   Chronic low back pain 03/09/2016   Coronary artery disease involving native coronary artery of native heart with angina pectoris (HCC)    COPD (chronic obstructive pulmonary disease) with chronic bronchitis (HCC) 10/12/2014   Premature ventricular contraction 07/16/2014   Thoracic back pain 05/30/2014   Anxiety state 05/25/2014   Hyperlipemia, mixed 05/08/2007   Depression, major, single episode, moderate (HCC) 05/08/2007   Essential hypertension 05/08/2007   ALLERGIC RHINITIS 05/08/2007   GERD 05/08/2007   Osteoarthritis 05/08/2007   DUODENAL ULCER, HX OF 05/08/2007   CHICKENPOX, HX OF 05/08/2007   Home Medication(s) Prior to Admission medications   Medication Sig Start Date End Date Taking? Authorizing Provider  albuterol (PROAIR HFA) 108 (90 Base) MCG/ACT inhaler Inhale 2 puffs into the lungs every 6 (six) hours as needed for wheezing. 05/14/23   [name], MD  ALPRAZolam Prudy Feeler) 0.25 MG tablet TAKE 3 TABLETS BY MOUTH EVERY 6 HOURS  AS NEEDED 09/28/22   [name], MD  amLODipine (NORVASC) 5 MG tablet Take 1 tablet (5 mg total) by mouth daily. 06/05/23   [name], MD  ARIPiprazole (ABILIFY) 5 MG tablet TAKE 1 TABLET (5 MG TOTAL) BY MOUTH DAILY. 06/13/23   [name], MD  aspirin EC 81 MG tablet Take 1 tablet (81 mg total) by mouth daily. 01/18/15   Kathleene Hazel, MD  cetirizine (ZYRTEC) 10 MG tablet Take 10 mg by mouth daily as needed for allergies.     [provider]  cholecalciferol (VITAMIN D3) 25 MCG (1000 UNIT) tablet Take 1,000 Units by mouth daily.    [provider]  flecainide (TAMBOCOR) 50 MG tablet TAKE 1 TABLET BY  MOUTH THREE TIMES A DAY 06/05/23   Kathleene Hazel, MD  isosorbide mononitrate (IMDUR) 30 MG 24 hr tablet Take 1 tablet (30 mg total) by mouth daily. 06/04/23   Kathleene Hazel, MD  lisinopril (ZESTRIL) 10 MG tablet Take 10 mg by mouth daily. 07/11/19   [provider]  magnesium oxide (MAG-OX) 400 (240 Mg) MG tablet TAKE 1 TABLET BY MOUTH EVERY DAY 06/05/23   Kathleene Hazel, MD  magnesium oxide (MAG-OX) 400 (240 Mg) MG tablet Take 1 tablet by mouth daily. 05/05/23   [provider]  metoprolol succinate (TOPROL-XL) 50 MG 24 hr tablet TAKE 1 TABLET BY MOUTH DAILY. TAKE WITH OR IMMEDIATELY FOLLOWING A MEAL. 05/18/23   [name], MD  nitroGLYCERIN (NITROSTAT) 0.4 MG SL tablet Place 1 tablet (0.4 mg total) under the tongue every 5 (five) minutes as needed for chest pain. 06/04/23   Kathleene Hazel, MD  temazepam (RESTORIL) 15 MG capsule Take 1 capsule (15 mg total) by mouth at bedtime as needed for sleep. 05/21/23   [name], MD                                                                                                                                    Past Surgical History Past Surgical History:  Procedure Laterality Date   BREAST BIOPSY     CARDIAC CATHETERIZATION N/A 12/03/2015   Procedure: Left Heart Cath and Coronary Angiography;  Surgeon: Kathleene Hazel, MD;  Location: [facility] INVASIVE CV LAB;  Service: Cardiovascular;  Laterality: N/A;   CARPAL TUNNEL RELEASE Left    1997 left,     CHOLECYSTECTOMY N/A 03/23/2015   Procedure: LAPAROSCOPIC CHOLECYSTECTOMY;  Surgeon: Axel Filler, MD;  Location: [facility] OR;  Service: General;  Laterality: N/A;   COLONOSCOPY  10/17/2013   per Dr. Juanda Chance, benign polyps, repeat in 10 years   EUS N/A 11/12/2014   Procedure: UPPER ENDOSCOPIC ULTRASOUND (EUS) LINEAR;  Surgeon: Rachael Fee, MD;  Location: [facility] ENDOSCOPY;  Service: Endoscopy;  Laterality: N/A;   EUS N/A 06/08/2016   Procedure: UPPER ENDOSCOPIC  ULTRASOUND (EUS) RADIAL;  Surgeon: Rachael Fee, MD;  Location: [facility] ENDOSCOPY;  Service: Endoscopy;  Laterality: N/A;   EYE SURGERY     LEFT AND RIGHT HEART CATHETERIZATION WITH CORONARY ANGIOGRAM N/A 04/22/2014   Procedure: LEFT AND RIGHT HEART CATHETERIZATION WITH CORONARY ANGIOGRAM;  Surgeon: Kathleene Hazel, MD;  Location: [facility] CATH LAB;  Service: Cardiovascular;  Laterality: N/A;   TONSILLECTOMY  1950   torn cartilage repaired rt wrist Right 2003   Family History Family History  Problem Relation Age of Onset   CAD Brother    Kidney disease Brother    Emphysema Mother    Other Father        brain tumor   Throat cancer Brother    Hyperlipidemia Other    Hypertension Other    Kidney disease Other    Heart disease Other    Colon cancer Neg Hx    Esophageal cancer Neg Hx    Stomach cancer Neg Hx    Rectal cancer Neg Hx     Social History Social History   Tobacco Use   Smoking status: Former    Current packs/day: 0.00    Average packs/day: 0.5 packs/day for 54.0 years (24.3 ttl pk-yrs)    Types: Cigarettes    Start date: 01/02/1963    Quit date: 01/01/2017    Years since quitting: 6.4   Smokeless tobacco: Never   Tobacco comments:    1/2 pack per day or less  Vaping Use   Vaping status: Never Used  Substance Use Topics   Alcohol use: No    Alcohol/week: 0.0 standard drinks of alcohol   Drug use: No   Allergies Augmentin [amoxicillin-pot clavulanate], Clindamycin/lincomycin, Colchicine, Flecainide, and Hydroxychloroquine  Review of Systems Review of Systems  Respiratory:  Positive for shortness of breath.     Physical Exam Vital Signs  I have reviewed the triage vital signs Ht 5' (1.524 m)   Wt 86.6 kg   SpO2 94%   BMI 37.30 kg/m   Physical Exam Vitals and nursing note reviewed.  Constitutional:      General: She is not in acute distress.    Appearance: She is well-developed.  HENT:     Head: Normocephalic and atraumatic.  Eyes:      Conjunctiva/sclera: Conjunctivae normal.  Cardiovascular:     Rate and Rhythm: Normal rate and regular rhythm.     Heart sounds: No murmur heard. Pulmonary:     Effort: Tachypnea and respiratory distress present.     Breath sounds: Decreased breath sounds present.  Abdominal:     Palpations: Abdomen is soft.     Tenderness: There is no abdominal tenderness.  Musculoskeletal:        General: No swelling.     Cervical back: Neck supple.  Skin:    General: Skin is warm and dry.     Capillary Refill: Capillary refill takes less than 2 seconds.  Neurological:     Mental Status: She is alert.  Psychiatric:        Mood and Affect: Mood normal.     ED Results and Treatments Labs (all labs ordered are listed, but only abnormal results are displayed) Labs Reviewed  RESP PANEL BY RT-PCR (RSV, FLU A&B, COVID)  RVPGX2  COMPREHENSIVE METABOLIC PANEL  CBC WITH DIFFERENTIAL/PLATELET  BRAIN NATRIURETIC PEPTIDE  TROPONIN I (HIGH SENSITIVITY)  Radiology No results found.  Pertinent labs & imaging results that were available during my care of the patient were reviewed by me and considered in my medical decision making (see MDM for details).  Medications Ordered in ED Medications - No data to display                                                                                                                                   Procedures .Critical Care  Performed by: Glendora Score, MD Authorized by: Glendora Score, MD   Critical care provider statement:    Critical care time (minutes):  30   Critical care was necessary to treat or prevent imminent or life-threatening deterioration of the following conditions:  Respiratory failure   Critical care was time spent personally by me on the following activities:  Development of treatment plan with patient or surrogate,  discussions with consultants, evaluation of patient's response to treatment, examination of patient, ordering and review of laboratory studies, ordering and review of radiographic studies, ordering and performing treatments and interventions, pulse oximetry, re-evaluation of patient's condition and review of old charts   (including critical care time)  Medical Decision Making / ED Course   This patient presents to the ED for concern of ***, this involves an extensive number of treatment options, and is a complaint that carries with it a high risk of complications and morbidity.  The differential diagnosis includes ***  MDM: ***   Additional history obtained: -Additional history obtained from *** -External records from outside source obtained and reviewed including: Chart review including previous notes, labs, imaging, consultation notes   Lab Tests: -I ordered, reviewed, and interpreted labs.   The pertinent results include:   Labs Reviewed  RESP PANEL BY RT-PCR (RSV, FLU A&B, COVID)  RVPGX2  COMPREHENSIVE METABOLIC PANEL  CBC WITH DIFFERENTIAL/PLATELET  BRAIN NATRIURETIC PEPTIDE  TROPONIN I (HIGH SENSITIVITY)      EKG ***  EKG Interpretation Date/Time:    Ventricular Rate:    PR Interval:    QRS Duration:    QT Interval:    QTC Calculation:   R Axis:      Text Interpretation:           Imaging Studies ordered: I ordered imaging studies including *** I independently visualized and interpreted imaging. I agree with the radiologist interpretation   Medicines ordered and prescription drug management: No orders of the defined types were placed in this encounter.   -I have reviewed the patients home medicines and have made adjustments as needed  Critical interventions ***  Consultations Obtained: I requested consultation with the ***,  and discussed lab and imaging findings as well as pertinent plan - they recommend: ***   Cardiac Monitoring: The patient  was maintained on a cardiac monitor.  I personally viewed and interpreted the cardiac monitored which showed an underlying rhythm of: ***  Social Determinants of Health:  Factors  impacting patients care include: ***   Reevaluation: After the interventions noted above, I reevaluated the patient and found that they have :{resolved/improved/worsened:23923::"improved"}  Co morbidities that complicate the patient evaluation  Past Medical History:  Diagnosis Date   Allergic rhinitis    ALLERGIC RHINITIS 05/08/2007   Qualifier: Diagnosis of  By: Linna Darner, CMA, Cindy     Allergy    seasonal   Anxiety    Anxiety state 05/25/2014   Aortic valve disease    a. ? possible bicuspid AV.   CAD (coronary artery disease)    a. nonobstructive by prior cath   Cataract    bilateral   Chronic low back pain 03/09/2016   Complication of anesthesia 2003   Medication for block "went up to my brain and I stopped breathing"   COPD (chronic obstructive pulmonary disease) (HCC)    COPD (chronic obstructive pulmonary disease) with chronic bronchitis (HCC) 10/12/2014   Depression    Depression, major, single episode, moderate (HCC) 05/08/2007   Qualifier: Diagnosis of  By: Linna Darner, CMA, Cindy     Dexamethasone adverse reaction    2002 normal   Dilated aortic root (HCC)    a. mild dilated aortic root on prior echo, ascending aorta prominence on CT 2015.   Duodenal mass    GERD 05/08/2007   Qualifier: Diagnosis of  By: Linna Darner, CMA, Cindy     Hyperlipidemia    Hypertension    Insomnia    Neck pain, chronic 03/09/2016   NICOTINE ADDICTION 05/08/2007   Qualifier: Diagnosis of  By: Clent Ridges MD, Tera Mater    Osteoarthritis    Overactive bladder    Pneumonia    once or twivce in past   Premature ventricular contractions    LBBB inferior axis PVCs   Thoracic back pain 05/30/2014   Tobacco abuse    Ulcer    duodenal      Dispostion: I considered admission for this patient, ***     Final Clinical Impression(s) /  ED Diagnoses Final diagnoses:  None     @PCDICTATION @

## 2023-06-18 NOTE — ED Notes (Signed)
 Patient attempted to use bed side commode but became very short of breath, hypoxic and tachypenic. EDP Kommer notfied. Patient to be placed on BIPAP.

## 2023-06-18 NOTE — Hospital Course (Signed)
 [name] [name] is a 73 y.o. female with medical history significant for CAD, frequent PVCs on flecainide, COPD, CKD stage IIIa, HTN, HLD, depression/anxiety who is admitted with acute hypoxic respiratory failure suspected due to new diagnosis of CHF.

## 2023-06-18 NOTE — ED Notes (Signed)
 Patient transported to CT

## 2023-06-18 NOTE — H&P (Addendum)
 History and Physical    [name] [name] ZOX:[id number] DOB: [birth year] DOA: 06/18/2023  PCP: [name], MD  Patient coming from: Home  I have personally briefly reviewed patient's old medical records in [facility]  Chief Complaint: Shortness of breath  HPI: [name] [name] is a 73 y.o. 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  Patient reports chronic shortness of breath and nonproductive cough at her baseline due to her underlying COPD.  3 days ago she began to experience progressively worsening shortness of breath occurring with minimal activity.  She denies any worsening cough.  Dyspnea would occur when she would get up to go to bathroom or walk up a flight of stairs.  She has noticed some increased swelling in both her legs.  She denies chest pain, fevers, chills, diaphoresis.  She reports good urine output.  Patient states that her significant other recently passed away 2 weeks ago.  She is living at home with a new roommate now.  She states that she quit smoking years ago.  ED Course  Labs/Imaging on admission: I have personally reviewed following labs and imaging studies.  Initial vitals showed BP 152/93, pulse 84, RR 32, temp 97.3 F, SpO2 86% on room air subsequently placed on 6 L O2 via [place] with SpO2 98%.  Due to increased work of breathing patient was placed on BiPAP afterwards.  Labs showed sodium 136, potassium 4.8, bicarb 27, BUN 20, creatinine 1.08, serum glucose 111, LFTs within normal limits, WBC 7.2, hemoglobin 14.0, platelets 304,000, BNP 715.9, troponin 97.  SARS-CoV-2, influenza, RSV PCR negative.  Portable chest x-ray showed bilateral lower lobe airspace opacities.  CTA chest ordered and pending.  Patient was given IV Lasix 40 mg.  The hospitalist service was consulted to admit.  Review of Systems:  All systems reviewed and are negative except as documented in history of present illness above.   Past Medical History:  Diagnosis Date   Allergic rhinitis    ALLERGIC RHINITIS 05/08/2007   Qualifier: Diagnosis of  By: Linna Darner, CMA, Cindy     Allergy    seasonal   Anxiety    Anxiety state 05/25/2014   Aortic valve disease    a. ? possible bicuspid AV.   CAD (coronary artery disease)    a. nonobstructive by prior cath   Cataract    bilateral   Chronic low back pain 03/09/2016   Complication of anesthesia 2003   Medication for block "went up to my brain and I stopped breathing"   COPD (chronic obstructive pulmonary disease) (HCC)    COPD (chronic obstructive pulmonary disease) with chronic bronchitis (HCC) 10/12/2014   Depression    Depression, major, single episode, moderate (HCC) 05/08/2007   Qualifier: Diagnosis of  By: Linna Darner, CMA, Cindy     Dexamethasone adverse reaction    2002 normal   Dilated aortic root (HCC)    a. mild dilated aortic root on prior echo, ascending aorta prominence on CT 2015.   Duodenal mass    GERD 05/08/2007   Qualifier: Diagnosis of  By: Linna Darner, CMA, Cindy     Hyperlipidemia    Hypertension    Insomnia    Neck pain, chronic 03/09/2016   NICOTINE ADDICTION 05/08/2007   Qualifier: Diagnosis of  By: Clent Ridges MD, Tera Mater    Osteoarthritis    Overactive bladder  Pneumonia    once or twivce in past   Premature ventricular contractions    LBBB inferior axis PVCs   Thoracic back pain 05/30/2014   Tobacco abuse    Ulcer    duodenal    Past Surgical History:  Procedure Laterality Date   BREAST BIOPSY     CARDIAC CATHETERIZATION N/A 12/03/2015   Procedure: Left Heart Cath and Coronary Angiography;  Surgeon: Kathleene Hazel, MD;  Location: [facility] INVASIVE CV LAB;  Service: Cardiovascular;  Laterality: N/A;   CARPAL TUNNEL RELEASE Left    1997 left,     CHOLECYSTECTOMY N/A 03/23/2015   Procedure: LAPAROSCOPIC CHOLECYSTECTOMY;  Surgeon: Axel Filler, MD;   Location: [facility] OR;  Service: General;  Laterality: N/A;   COLONOSCOPY  10/17/2013   per Dr. Juanda Chance, benign polyps, repeat in 10 years   EUS N/A 11/12/2014   Procedure: UPPER ENDOSCOPIC ULTRASOUND (EUS) LINEAR;  Surgeon: Rachael Fee, MD;  Location: [facility] ENDOSCOPY;  Service: Endoscopy;  Laterality: N/A;   EUS N/A 06/08/2016   Procedure: UPPER ENDOSCOPIC ULTRASOUND (EUS) RADIAL;  Surgeon: Rachael Fee, MD;  Location: [facility] ENDOSCOPY;  Service: Endoscopy;  Laterality: N/A;   EYE SURGERY     LEFT AND RIGHT HEART CATHETERIZATION WITH CORONARY ANGIOGRAM N/A 04/22/2014   Procedure: LEFT AND RIGHT HEART CATHETERIZATION WITH CORONARY ANGIOGRAM;  Surgeon: Kathleene Hazel, MD;  Location[facility] CATH LAB;  Service: Cardiovascular;  Laterality: N/A;   TONSILLECTOMY  1950   torn cartilage repaired rt wrist Right 2003    Social History: Social History   Tobacco Use   Smoking status: Former    Current packs/day: 0.00    Average packs/day: 0.5 packs/day for 54.0 years (24.3 ttl pk-yrs)    Types: Cigarettes    Start date: 01/02/1963    Quit date: 01/01/2017    Years since quitting: 6.4   Smokeless tobacco: Never   Tobacco comments:    1/2 pack per day or less  Vaping Use   Vaping status: Never Used  Substance Use Topics   Alcohol use: No    Alcohol/week: 0.0 standard drinks of alcohol   Drug use: No    Allergies  Allergen Reactions   Augmentin [Amoxicillin-Pot Clavulanate] Nausea And Vomiting   Clindamycin/Lincomycin Nausea And Vomiting   Colchicine Diarrhea   Flecainide Other (See Comments)    Conjunctivitis    Hydroxychloroquine Rash    Family History  Problem Relation Age of Onset   CAD Brother    Kidney disease Brother    Emphysema Mother    Other Father        brain tumor   Throat cancer Brother    Hyperlipidemia Other    Hypertension Other    Kidney disease Other    Heart disease Other    Colon cancer Neg Hx    Esophageal cancer Neg Hx    Stomach cancer Neg Hx     Rectal cancer Neg Hx      Prior to Admission medications   Medication Sig Start Date End Date Taking? Authorizing Provider  albuterol (PROAIR HFA) 108 (90 Base) MCG/ACT inhaler Inhale 2 puffs into the lungs every 6 (six) hours as needed for wheezing. 05/14/23   [name], MD  ALPRAZolam Prudy Feeler) 0.25 MG tablet TAKE 3 TABLETS BY MOUTH EVERY 6 HOURS AS NEEDED 09/28/22   [name], MD  amLODipine (NORVASC) 5 MG tablet Take 1 tablet (5 mg total) by mouth daily. 06/05/23   [name],  MD  ARIPiprazole (ABILIFY) 5 MG tablet TAKE 1 TABLET (5 MG TOTAL) BY MOUTH DAILY. 06/13/23   [name], MD  aspirin EC 81 MG tablet Take 1 tablet (81 mg total) by mouth daily. 01/18/15   Kathleene Hazel, MD  cetirizine (ZYRTEC) 10 MG tablet Take 10 mg by mouth daily as needed for allergies.     [provider]  cholecalciferol (VITAMIN D3) 25 MCG (1000 UNIT) tablet Take 1,000 Units by mouth daily.    [provider]  flecainide (TAMBOCOR) 50 MG tablet TAKE 1 TABLET BY MOUTH THREE TIMES A DAY 06/05/23   Kathleene Hazel, MD  isosorbide mononitrate (IMDUR) 30 MG 24 hr tablet Take 1 tablet (30 mg total) by mouth daily. 06/04/23   Kathleene Hazel, MD  lisinopril (ZESTRIL) 10 MG tablet Take 10 mg by mouth daily. 07/11/19   [provider]  magnesium oxide (MAG-OX) 400 (240 Mg) MG tablet TAKE 1 TABLET BY MOUTH EVERY DAY 06/05/23   Kathleene Hazel, MD  magnesium oxide (MAG-OX) 400 (240 Mg) MG tablet Take 1 tablet by mouth daily. 05/05/23   [provider]  metoprolol succinate (TOPROL-XL) 50 MG 24 hr tablet TAKE 1 TABLET BY MOUTH DAILY. TAKE WITH OR IMMEDIATELY FOLLOWING A MEAL. 05/18/23   [name], MD  nitroGLYCERIN (NITROSTAT) 0.4 MG SL tablet Place 1 tablet (0.4 mg total) under the tongue every 5 (five) minutes as needed for chest pain. 06/04/23   Kathleene Hazel, MD  temazepam (RESTORIL) 15 MG capsule Take 1 capsule (15 mg total) by mouth at  bedtime as needed for sleep. 05/21/23   [name], MD    Physical Exam: Vitals:   06/18/23 2015 06/18/23 2030 06/18/23 2041 06/18/23 2047  BP: (!) 148/91 (!) 158/67 (!) 144/98   Pulse:  84 88   Resp: (!) 30 (!) 32 19   Temp:   98 F (36.7 C)   TempSrc:   Oral   SpO2:  91% 98% 98%  Weight:      Height:       Constitutional: Resting in bed with an elevated Eyes: EOMI, lids and conjunctivae normal ENMT: Mucous membranes are moist. Posterior pharynx clear of any exudate or lesions.Normal dentition.  Neck: normal, supple, no masses. Respiratory: Initially on BiPAP which was removed by RT during exam and placed on 4 L O2 [place].  Clear to auscultation bilaterally, no wheezing, no crackles. Normal respiratory effort. No accessory muscle use.  Cardiovascular: Regular rate and rhythm, no murmurs / rubs / gallops.  Trace nonpitting bilateral lower extremity edema. 2+ pedal pulses. Abdomen: no tenderness, no masses palpated. Musculoskeletal: no clubbing / cyanosis. No joint deformity upper and lower extremities. Good ROM, no contractures. Normal muscle tone.  Skin: no rashes, lesions, ulcers. No induration Neurologic: Sensation intact. Strength 5/5 in all 4.  Psychiatric: Normal judgment and insight. Alert and oriented x 3. Normal mood.   EKG: Personally reviewed. Sinus rhythm, rate 85, first-degree AV block, multiple PVCs.  PVCs new when compared to previous EKG.  Assessment/Plan Principal Problem:   Acute respiratory failure with hypoxia (HCC) Active Problems:   Hyperlipemia, mixed   Depression, major, single episode, moderate (HCC)   Essential hypertension   COPD (chronic obstructive pulmonary disease) with chronic bronchitis (HCC)   Coronary artery disease involving native coronary artery of native heart with angina pectoris (HCC)   Chronic kidney disease, stage 3a (HCC)   [name] [name] is a 73 y.o.  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  Assessment and Plan: Acute respiratory failure with hypoxia: SpO2 86% on RA on arrival.  Required BiPAP in the ED with improvement and transition to 4 L O2 via [place] at time of admission.  BNP 715.9.  Suspect symptoms are related to new diagnosis of CHF.  No wheezing appreciated to suggest COPD exacerbation.  CXR with bilateral lower lobe airspace opacities. -Continue IV Lasix 40 mg twice daily -Follow CTA chest -Continue supplemental oxygen as needed and wean as able -Obtain echocardiogram -Strict I/O's and daily weights  COPD: No wheezing appreciated to suggest acute exacerbation of COPD.  She is not on maintenance therapy as an outpatient.  Continue DuoNebs as needed.  Coronary artery disease with elevated troponin: Stable, denies chest pain.  She has nonobstructive disease by LHC in 2017.  Mild troponin elevation likely demand ischemia due to acute hypoxic respiratory failure.  EKG without acute ischemic changes. -Keep on telemetry -Continue aspirin, does not appear to be on statin  History of frequent symptomatic PVCs: Continue flecainide and Toprol-XL.  Hypertension: Continue Toprol-XL, amlodipine, lisinopril.  CKD stage IIIa: Renal function is stable.  Continue to monitor.  Hyperlipidemia: Not currently on statin.  Depression/anxiety: Continue Abilify.   DVT prophylaxis: enoxaparin (LOVENOX) injection 40 mg Start: 06/18/23 2030 Code Status:   Code Status: Do not attempt resuscitation (DNR) PRE-ARREST INTERVENTIONS DESIRED confirmed with patient on admission. Family Communication: Patient states that she has no family.  She has discussed with her close acquaintances. Disposition Plan: From home and likely discharge to home pending clinical progress Consults called: None Severity of Illness: The appropriate patient status for this  patient is OBSERVATION. Observation status is judged to be reasonable and necessary in order to provide the required intensity of service to ensure the patient's safety. The patient's presenting symptoms, physical exam findings, and initial radiographic and laboratory data in the context of their medical condition is felt to place them at decreased risk for further clinical deterioration. Furthermore, it is anticipated that the patient will be medically stable for discharge from the hospital within 2 midnights of admission.   Darreld Mclean MD [facility]  If 7PM-7AM, please contact night-coverage [email or web address]  06/18/2023, 9:30 PM

## 2023-06-18 NOTE — Progress Notes (Signed)
 Pt taken off BIPAP at this time. Pt placed on 4L [place]

## 2023-06-18 NOTE — ED Triage Notes (Signed)
 Patient bib EMS from home with complaints of shortness of breathe since Friday. EMS states lungs are clear but diminished. Initial oxygen saturation 86% on room air, 94% on 5liters.   Alert and oriented x4 on arrival

## 2023-06-19 ENCOUNTER — Observation Stay (HOSPITAL_COMMUNITY)

## 2023-06-19 ENCOUNTER — Other Ambulatory Visit (HOSPITAL_COMMUNITY)

## 2023-06-19 DIAGNOSIS — I2510 Atherosclerotic heart disease of native coronary artery without angina pectoris: Secondary | ICD-10-CM | POA: Diagnosis not present

## 2023-06-19 DIAGNOSIS — Z7401 Bed confinement status: Secondary | ICD-10-CM | POA: Diagnosis not present

## 2023-06-19 DIAGNOSIS — J9601 Acute respiratory failure with hypoxia: Secondary | ICD-10-CM | POA: Diagnosis not present

## 2023-06-19 DIAGNOSIS — E669 Obesity, unspecified: Secondary | ICD-10-CM | POA: Diagnosis present

## 2023-06-19 DIAGNOSIS — J449 Chronic obstructive pulmonary disease, unspecified: Secondary | ICD-10-CM | POA: Diagnosis not present

## 2023-06-19 DIAGNOSIS — N1831 Chronic kidney disease, stage 3a: Secondary | ICD-10-CM | POA: Diagnosis not present

## 2023-06-19 DIAGNOSIS — I25119 Atherosclerotic heart disease of native coronary artery with unspecified angina pectoris: Secondary | ICD-10-CM | POA: Diagnosis not present

## 2023-06-19 DIAGNOSIS — I10 Essential (primary) hypertension: Secondary | ICD-10-CM | POA: Diagnosis not present

## 2023-06-19 DIAGNOSIS — I493 Ventricular premature depolarization: Secondary | ICD-10-CM | POA: Diagnosis not present

## 2023-06-19 DIAGNOSIS — G4700 Insomnia, unspecified: Secondary | ICD-10-CM | POA: Diagnosis present

## 2023-06-19 DIAGNOSIS — R0609 Other forms of dyspnea: Secondary | ICD-10-CM | POA: Diagnosis not present

## 2023-06-19 DIAGNOSIS — Z79899 Other long term (current) drug therapy: Secondary | ICD-10-CM | POA: Diagnosis not present

## 2023-06-19 DIAGNOSIS — J4489 Other specified chronic obstructive pulmonary disease: Secondary | ICD-10-CM | POA: Diagnosis not present

## 2023-06-19 DIAGNOSIS — Z87891 Personal history of nicotine dependence: Secondary | ICD-10-CM | POA: Diagnosis not present

## 2023-06-19 DIAGNOSIS — M069 Rheumatoid arthritis, unspecified: Secondary | ICD-10-CM | POA: Diagnosis not present

## 2023-06-19 DIAGNOSIS — I2489 Other forms of acute ischemic heart disease: Secondary | ICD-10-CM | POA: Diagnosis present

## 2023-06-19 DIAGNOSIS — J9621 Acute and chronic respiratory failure with hypoxia: Secondary | ICD-10-CM | POA: Diagnosis not present

## 2023-06-19 DIAGNOSIS — G894 Chronic pain syndrome: Secondary | ICD-10-CM | POA: Diagnosis not present

## 2023-06-19 DIAGNOSIS — Z8249 Family history of ischemic heart disease and other diseases of the circulatory system: Secondary | ICD-10-CM | POA: Diagnosis not present

## 2023-06-19 DIAGNOSIS — N1832 Chronic kidney disease, stage 3b: Secondary | ICD-10-CM | POA: Diagnosis not present

## 2023-06-19 DIAGNOSIS — I5082 Biventricular heart failure: Secondary | ICD-10-CM | POA: Diagnosis present

## 2023-06-19 DIAGNOSIS — M6281 Muscle weakness (generalized): Secondary | ICD-10-CM | POA: Diagnosis not present

## 2023-06-19 DIAGNOSIS — I5033 Acute on chronic diastolic (congestive) heart failure: Secondary | ICD-10-CM | POA: Diagnosis not present

## 2023-06-19 DIAGNOSIS — I130 Hypertensive heart and chronic kidney disease with heart failure and stage 1 through stage 4 chronic kidney disease, or unspecified chronic kidney disease: Secondary | ICD-10-CM | POA: Diagnosis not present

## 2023-06-19 DIAGNOSIS — Z66 Do not resuscitate: Secondary | ICD-10-CM | POA: Diagnosis not present

## 2023-06-19 DIAGNOSIS — I359 Nonrheumatic aortic valve disorder, unspecified: Secondary | ICD-10-CM | POA: Diagnosis not present

## 2023-06-19 DIAGNOSIS — G4733 Obstructive sleep apnea (adult) (pediatric): Secondary | ICD-10-CM | POA: Diagnosis present

## 2023-06-19 DIAGNOSIS — R0902 Hypoxemia: Secondary | ICD-10-CM | POA: Diagnosis not present

## 2023-06-19 DIAGNOSIS — N179 Acute kidney failure, unspecified: Secondary | ICD-10-CM | POA: Diagnosis not present

## 2023-06-19 DIAGNOSIS — F321 Major depressive disorder, single episode, moderate: Secondary | ICD-10-CM | POA: Diagnosis present

## 2023-06-19 DIAGNOSIS — Z1152 Encounter for screening for COVID-19: Secondary | ICD-10-CM | POA: Diagnosis not present

## 2023-06-19 DIAGNOSIS — R531 Weakness: Secondary | ICD-10-CM | POA: Diagnosis not present

## 2023-06-19 DIAGNOSIS — E782 Mixed hyperlipidemia: Secondary | ICD-10-CM | POA: Diagnosis not present

## 2023-06-19 DIAGNOSIS — M6259 Muscle wasting and atrophy, not elsewhere classified, multiple sites: Secondary | ICD-10-CM | POA: Diagnosis not present

## 2023-06-19 DIAGNOSIS — I2729 Other secondary pulmonary hypertension: Secondary | ICD-10-CM | POA: Diagnosis not present

## 2023-06-19 DIAGNOSIS — R339 Retention of urine, unspecified: Secondary | ICD-10-CM | POA: Diagnosis not present

## 2023-06-19 LAB — CBC
HCT: 44.8 % (ref 36.0–46.0)
Hemoglobin: 13.9 g/dL (ref 12.0–15.0)
MCH: 30.3 pg (ref 26.0–34.0)
MCHC: 31 g/dL (ref 30.0–36.0)
MCV: 97.6 fL (ref 80.0–100.0)
Platelets: 326 10*3/uL (ref 150–400)
RBC: 4.59 MIL/uL (ref 3.87–5.11)
RDW: 13.4 % (ref 11.5–15.5)
WBC: 7.8 10*3/uL (ref 4.0–10.5)
nRBC: 0.3 % — ABNORMAL HIGH (ref 0.0–0.2)

## 2023-06-19 LAB — BASIC METABOLIC PANEL
Anion gap: 8 (ref 5–15)
BUN: 23 mg/dL (ref 8–23)
CO2: 29 mmol/L (ref 22–32)
Calcium: 8.6 mg/dL — ABNORMAL LOW (ref 8.9–10.3)
Chloride: 100 mmol/L (ref 98–111)
Creatinine, Ser: 1.25 mg/dL — ABNORMAL HIGH (ref 0.44–1.00)
GFR, Estimated: 45 mL/min — ABNORMAL LOW (ref >60)
Glucose, Bld: 100 mg/dL — ABNORMAL HIGH (ref 70–99)
Potassium: 4.7 mmol/L (ref 3.5–5.1)
Sodium: 137 mmol/L (ref 135–145)

## 2023-06-19 LAB — ECHOCARDIOGRAM COMPLETE
AR max vel: 2.65 cm2
AV Area VTI: 2.58 cm2
AV Area mean vel: 2.49 cm2
AV Mean grad: 7 mmHg
AV Peak grad: 12.7 mmHg
Ao pk vel: 1.78 m/s
Area-P 1/2: 4.63 cm2
Height: 60 in
S' Lateral: 3 cm
Weight: 3056 [oz_av]

## 2023-06-19 LAB — MAGNESIUM: Magnesium: 2 mg/dL (ref 1.7–2.4)

## 2023-06-19 MED ORDER — SODIUM CHLORIDE 0.9 % IV SOLN
12.5000 mg | Freq: Four times a day (QID) | INTRAVENOUS | Status: DC | PRN
  Filled 2023-06-19 (×2): qty 0.5

## 2023-06-19 NOTE — Progress Notes (Signed)
 PROGRESS NOTE    [name] [name]  ZOX:[id number] DOB: 03-09-1951 DOA: 06/18/2023 PCP: [name], MD   Brief Narrative:  HPI: [name] [name] is a 73 y.o. 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   Patient reports chronic shortness of breath and nonproductive cough at her baseline due to her underlying COPD.  3 days ago she began to experience progressively worsening shortness of breath occurring with minimal activity.  She denies any worsening cough.  Dyspnea would occur when she would get up to go to bathroom or walk up a flight of stairs.  She has noticed some increased swelling in both her legs.  She denies chest pain, fevers, chills, diaphoresis.  She reports good urine output.   Patient states that her significant other recently passed away 2 weeks ago.  She is living at home with a new roommate now.  She states that she quit smoking years ago.   ED Course  Labs/Imaging on admission: I have personally reviewed following labs and imaging studies.   Initial vitals showed BP 152/93, pulse 84, RR 32, temp 97.3 F, SpO2 86% on room air subsequently placed on 6 L O2 via  with SpO2 98%.  Due to increased work of breathing patient was placed on BiPAP afterwards.   Labs showed sodium 136, potassium 4.8, bicarb 27, BUN 20, creatinine 1.08, serum glucose 111, LFTs within normal limits, WBC 7.2, hemoglobin 14.0, platelets 304,000, BNP 715.9, troponin 97.   SARS-CoV-2, influenza, RSV PCR negative.   Portable chest x-ray showed bilateral lower lobe airspace opacities.   CTA chest ordered and pending.   Patient was given IV Lasix 40 mg.  The hospitalist service was consulted to admit.  Assessment & Plan:   Principal Problem:   Acute respiratory failure with hypoxia (HCC) Active Problems:   Hyperlipemia, mixed   Depression, major,  single episode, moderate (HCC)   Essential hypertension   COPD (chronic obstructive pulmonary disease) with chronic bronchitis (HCC)   Coronary artery disease involving native coronary artery of native heart with angina pectoris (HCC)   Chronic kidney disease, stage 3a (HCC)  Acute respiratory failure with hypoxia secondary to acute on chronic congestive heart failure with preserved ejection fraction: Last echo completed in 2020 shows normal ejection fraction and grade 1 diastolic dysfunction.  Upon arrival, patient was hypoxic, requiring BiPAP and eventually weaned to 4 L of oxygen.  Although chest x-ray shows possibility of bilateral lower lobe airspace opacities however patient with no fever and no leukocytosis as well as chronic fever, does not appear to have pneumonia clinically.  BNP elevated at 715 however no prior baseline known, she was thought to be having CHF exacerbation, started on IV Lasix 40 mg twice daily.  Has diuresed almost 1.8 L since admission.  Symptoms slightly improved.  CTA negative for PE or any pneumonia though.  Strict I's and O's, low-sodium diet, daily weight.  Echo pending.  COPD: No wheezing.  This is appears to be stable.  Continue bronchodilators.  CAD/elevated troponin: Denies chest pain.  Troponin elevated slightly around 100 but stable, does not indicate ACS.  Echo pending.  History of frequent symptomatic PVCs: Continue flecainide and Toprol-XL.   Hypertension: Continue Toprol-XL, amlodipine, lisinopril.   CKD stage IIIa: Renal function is stable.  Continue to monitor.   Hyperlipidemia: Not currently on statin.   Depression/anxiety: Continue  Abilify.  CKD stage IIIb: Baseline creatinine around 1.2-1.3.  Currently at baseline.  DVT prophylaxis: enoxaparin (LOVENOX) injection 40 mg Start: 06/18/23 2030   Code Status: Do not attempt resuscitation (DNR) PRE-ARREST INTERVENTIONS DESIRED  Family Communication:  None present at bedside.  Plan of care  discussed with patient in length and he/she verbalized understanding and agreed with it.  Status is: Observation The patient will require care spanning > 2 midnights and should be moved to inpatient because: Still fluid overloaded, needs IV diuresis.   Estimated body mass index is 37.3 kg/m as calculated from the following:   Height as of this encounter: 5' (1.524 m).   Weight as of this encounter: 86.6 kg.    Nutritional Assessment: Body mass index is 37.3 kg/m.Marland Kitchen Seen by dietician.  I agree with the assessment and plan as outlined below: Nutrition Status:        . Skin Assessment: I have examined the patient's skin and I agree with the wound assessment as performed by the wound care RN as outlined below:    Consultants:  None  Procedures:  None  Antimicrobials:  Anti-infectives (From admission, onward)    None         Subjective: Patient seen and examined, still complains of shortness of breath with minimal improvement.  No other complaint.  Objective: Vitals:   06/19/23 0200 06/19/23 0515 06/19/23 0534 06/19/23 0630  BP: 108/62 132/86  118/71  Pulse: 92 86  97  Resp: (!) 24 20  16   Temp:   98.4 F (36.9 C)   TempSrc:   Oral   SpO2: 94%  95% (!) 89%  Weight:      Height:        Intake/Output Summary (Last 24 hours) at 06/19/2023 0851 Last data filed at 06/19/2023 0025 Gross per 24 hour  Intake --  Output 1800 ml  Net -1800 ml   Filed Weights   06/18/23 1556  Weight: 86.6 kg    Examination:  General exam: Appears calm and comfortable  Respiratory system: Diminished breath sounds, poor inspiratory effort, no crackles wheezes or rhonchi. Cardiovascular system: S1 & S2 heard, RRR. No JVD, murmurs, rubs, gallops or clicks. No pedal edema. Gastrointestinal system: Abdomen is nondistended, soft and nontender. No organomegaly or masses felt. Normal bowel sounds heard. Central nervous system: Alert and oriented. No focal neurological  deficits. Extremities: Symmetric 5 x 5 power. Skin: No rashes, lesions or ulcers Psychiatry: Judgement and insight appear normal. Mood & affect appropriate.    Data Reviewed: I have personally reviewed following labs and imaging studies  CBC: Recent Labs  Lab 06/18/23 1648 06/19/23 0500  WBC 7.2 7.8  NEUTROABS 5.3  --   HGB 14.0 13.9  HCT 46.0 44.8  MCV 98.7 97.6  PLT 304 326   Basic Metabolic Panel: Recent Labs  Lab 06/18/23 1648 06/19/23 0500  NA 136 137  K 4.8 4.7  CL 97* 100  CO2 27 29  GLUCOSE 111* 100*  BUN 20 23  CREATININE 1.08* 1.25*  CALCIUM 8.8* 8.6*  MG  --  2.0   GFR: Estimated Creatinine Clearance: 38.6 mL/min (A) (by C-G formula based on SCr of 1.25 mg/dL (H)). Liver Function Tests: Recent Labs  Lab 06/18/23 1648  AST 20  ALT 16  ALKPHOS 54  BILITOT 0.6  PROT 6.8  ALBUMIN 3.5   No results for input(s): "LIPASE", "AMYLASE" in the last 168 hours. No results for input(s): "AMMONIA" in the last 168 hours.  Coagulation Profile: No results for input(s): "INR", "PROTIME" in the last 168 hours. Cardiac Enzymes: No results for input(s): "CKTOTAL", "CKMB", "CKMBINDEX", "TROPONINI" in the last 168 hours. BNP (last 3 results) No results for input(s): "PROBNP" in the last 8760 hours. HbA1C: No results for input(s): "HGBA1C" in the last 72 hours. CBG: No results for input(s): "GLUCAP" in the last 168 hours. Lipid Profile: No results for input(s): "CHOL", "HDL", "LDLCALC", "TRIG", "CHOLHDL", "LDLDIRECT" in the last 72 hours. Thyroid Function Tests: No results for input(s): "TSH", "T4TOTAL", "FREET4", "T3FREE", "THYROIDAB" in the last 72 hours. Anemia Panel: No results for input(s): "VITAMINB12", "FOLATE", "FERRITIN", "TIBC", "IRON", "RETICCTPCT" in the last 72 hours. Sepsis Labs: No results for input(s): "PROCALCITON", "LATICACIDVEN" in the last 168 hours.  Recent Results (from the past 240 hours)  Resp panel by RT-PCR (RSV, Flu A&B, Covid)  Anterior Nasal Swab     Status: None   Collection Time: 06/18/23  3:57 PM   Specimen: Anterior Nasal Swab  Result Value Ref Range Status   SARS Coronavirus 2 by RT PCR NEGATIVE NEGATIVE Final   Influenza A by PCR NEGATIVE NEGATIVE Final   Influenza B by PCR NEGATIVE NEGATIVE Final    Comment: (NOTE) The Xpert Xpress SARS-CoV-2/FLU/RSV plus assay is intended as an aid in the diagnosis of influenza from Nasopharyngeal swab specimens and should not be used as a sole basis for treatment. Nasal washings and aspirates are unacceptable for Xpert Xpress SARS-CoV-2/FLU/RSV testing.  Fact Sheet for Patients: BloggerCourse.com  Fact Sheet for Healthcare Providers: SeriousBroker.it  This test is not yet approved or cleared by the [place] FDA and has been authorized for detection and/or diagnosis of SARS-CoV-2 by FDA under an Emergency Use Authorization (EUA). This EUA will remain in effect (meaning this test can be used) for the duration of the COVID-19 declaration under Section 564(b)(1) of the Act, 21 U.S.C. section 360bbb-3(b)(1), unless the authorization is terminated or revoked.     Resp Syncytial Virus by PCR NEGATIVE NEGATIVE Final    Comment: (NOTE) Fact Sheet for Patients: BloggerCourse.com  Fact Sheet for Healthcare Providers: SeriousBroker.it  This test is not yet approved or cleared by the [place] FDA and has been authorized for detection and/or diagnosis of SARS-CoV-2 by FDA under an Emergency Use Authorization (EUA). This EUA will remain in effect (meaning this test can be used) for the duration of the COVID-19 declaration under Section 564(b)(1) of the Act, 21 U.S.C. section 360bbb-3(b)(1), unless the authorization is terminated or revoked.  Performed at [facility], [address] [place], [place] [address]      Radiology Studies: CT Angio Chest  PE W and/or Wo Contrast Result Date: 06/18/2023 CLINICAL DATA:  Pulmonary embolism suspected, high probability. Shortness of breath. EXAM: CT ANGIOGRAPHY CHEST WITH CONTRAST TECHNIQUE: Multidetector CT imaging of the chest was performed using the standard protocol during bolus administration of intravenous contrast. Multiplanar CT image reconstructions and MIPs were obtained to evaluate the vascular anatomy. RADIATION DOSE REDUCTION: This exam was performed according to the departmental dose-optimization program which includes automated exposure control, adjustment of the mA and/or kV according to patient size and/or use of iterative reconstruction technique. CONTRAST:  75mL OMNIPAQUE IOHEXOL 350 MG/ML SOLN COMPARISON:  09/11/2021, 04/13/2021. FINDINGS: Cardiovascular: The heart is enlarged and there is no pericardial effusion. Multi-vessel coronary artery calcifications are noted. 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 The pulmonary trunk is mildly distended which may  be associated with underlying pulmonary artery hypertension. No pulmonary embolism is seen. Evaluation of the pulmonary arteries at the lung bases is limited due to respiratory motion artifact. Mediastinum/Nodes: No enlarged mediastinal, hilar, or axillary lymph nodes. Thyroid gland, trachea, and esophagus demonstrate no significant findings. Lungs/Pleura: Scattered atelectasis or scarring is noted bilaterally. There are small bilateral pleural effusions with dependent atelectasis in the lower lobes. A nodular opacity is noted in the right middle lobe measuring 1.1 cm, versus 1.0 cm on the previous exam. There is a 7 mm nodule in the left lower lobe, versus 6 mm on the previous exam, axial image 72. No new nodule is seen. No pneumothorax. Upper Abdomen: There is thickening of the left adrenal gland without evidence of discrete nodule. An exophytic hypodensity is noted in  the mid right kidney, previously characterized as cyst. No acute abnormality. Musculoskeletal: Degenerative changes are present in the thoracic spine. No acute osseous abnormality. Review of the MIP images confirms the above findings. IMPRESSION: 1. No evidence of pulmonary embolism. 2. Scattered atelectasis or scarring bilaterally. 3. Small bilateral pleural effusions. 4. Scattered pulmonary nodules bilaterally measuring up to 1.1 cm, not significantly changed from 2022. Given stability over time, 1 year follow-up chest CT is recommended for high-risk patients. 5. Multi-vessel coronary artery calcifications. 6. 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 Recommend annual imaging followup by CTA or MRA. This recommendation follows 2010 ACCF/AHA/AATS/ACR/ASA/SCA/SCAI/SIR/STS/SVM Guidelines for the Diagnosis and Management of Patients with Thoracic Aortic Disease. Circulation. 2010; 121: J884-Z660. Aortic aneurysm NOS (ICD10-I71.9) Electronically Signed   By: Thornell Sartorius M.D.   On: 06/18/2023 22:25   DG Chest Portable 1 View Result Date: 06/18/2023 CLINICAL DATA:  Shortness of breath EXAM: PORTABLE CHEST 1 VIEW COMPARISON:  05/14/2015 FINDINGS: Heart and mediastinal contours within normal limits. Aortic atherosclerosis. Bilateral lower lobe airspace opacities. No visible effusions. No acute bony abnormality. IMPRESSION: Bilateral lower lobe airspace opacities could reflect atelectasis or pneumonia. Electronically Signed   By: Charlett Nose M.D.   On: 06/18/2023 17:15    Scheduled Meds:  amLODipine  5 mg Oral Daily   ARIPiprazole  2 mg Oral Daily   aspirin EC  81 mg Oral Daily   enoxaparin (LOVENOX) injection  40 mg Subcutaneous Q24H   flecainide  50 mg Oral TID   furosemide  40 mg Intravenous Q12H   lisinopril  10 mg Oral Daily   metoprolol succinate  50 mg Oral Daily   potassium chloride  10 mEq Oral Daily   sodium chloride flush  3 mL Intravenous Q12H    Continuous Infusions:   LOS: 0 days   Hughie Closs, MD [facility]  06/19/2023, 8:51 AM   *Please note that this is a verbal dictation therefore any spelling or grammatical errors are due to the "Dragon Medical One" system interpretation.  Please page via Amion and do not message via secure chat for urgent patient care matters. Secure chat can be used for non urgent patient care matters.  How to contact the [facility] Attending or Consulting provider 7A - 7P or covering provider during after hours 7P -7A, for this patient?  Check the care team in [facility] and look for a) attending/consulting [facility] provider listed and b) the [facility] team listed. Page or secure chat 7A-7P. Log into [email or web address] and use [place]'s universal password to access. If you do not have the password, please contact the hospital operator. Locate the [facility] provider you are looking for under  [facility] and page to a number that you can be directly reached. If you still have difficulty reaching the provider, please page the [facility] (Director on Call) for the Hospitalists listed on amion for assistance.

## 2023-06-19 NOTE — Progress Notes (Signed)
 Heart Failure Stewardship Pharmacist Progress Note   PCP: [name], MD PCP-Cardiologist: Verne Carrow, MD    HPI:  73 y.o. female with PMHx of RA, GERD, allergic rhinitis, osteoarthritis, HLD, HTN, COPD, CAD, CKD (stage 3a), and depression/anxiety.  She presented to ED on 03/17 with complaints of worsening SOB. Patient reported she is typically SOB and has a non-productive cough at baseline due to COPD, but 3 days ago she noticed DOE with minimal activity. She also reported lower extremity swelling.  Initial vitals were 152/93, HR 84, RR 32. She was given 6 L O2 via [place] and later transitioned to BiPAP. BNP 715.9. She was given Lasix IV 40 mg prior to admission. CXR on 03/17 showed bilateral lower lobe airspace opacities could reflect atelectasis or pneumonia. Chest CT on  03/17 revealed no evidence of pulmonary embolism, scattered atelectasis or scarring bilaterally, small bilateral pleural effusions, scattered pulmonary nodules not significantly changed from 2022, multi-vessel coronary artery calcifications, aortic arthrosclerosis with aneurysmal dilation of the ascending aorta. She does follow cardiology for CAD. Her SO recently passed ~2 weeks ago. ECHO on 03/18 revealed LVEF of 60-65% (unchanged from 2020), LV has normal function, no regional wall abnormalities, LV G1 DD, signs of RV pressure and volume overload, MV is normal in structure, and right atrial pressure of 3 mmHg.   Today she states she is very tired. Her breathing is more of the same as yesterday. She admitted to orthopnea. She denied lightheadedness and dizziness. Trace edema seen on exam. She is not on oxygen at home. She reports over the last year she has fallen 23 times. She often has to call EMS to get back up after each fall.  Current HF Medications: Diuretic: Furosemide 40 mg IV Q12H Beta Blocker: Metoprolol succinate 50 mg PO every day  ACE/ARB/ARNI: Lisinopril 10 mg every day  Other: Amlodipine 5 mg every  day   Prior to admission HF Medications: Beta blocker: Metoprolol succinate 50 mg every day (last dispensed 05/2023 for 90 ds) ACE/ARB/ARNI: Lisinopril 10 mg every day (last dispensed 05/2023 for 90 ds) Other: Amlodipine 5 mg every day (last dispensed 06/2023 for 90 ds) and Imdur 30 mg every day (patient reported not taking; last dispensed 06/2023 for 90 ds)  Pertinent Lab Values: Serum creatinine 1.25 (<1.08), BUN 23 (<20), Potassium 4.7 (<4.8), Sodium 137 (<136), BNP 715.9, Magnesium 2, A1c 5.8 (05/2023)  Vital Signs: Weight: not recorded today (admission weight: 190.9 lbs<- likely a guess) Blood pressure: 108/62-164/88 (118/71 @0630 )  Heart rate: 25-97 (97 @0630 )  I/O: net -1 L yesterday; net -1.8 L since admission  Medication Assistance / Insurance Benefits Check: Does the patient have prescription insurance?  Yes Type of insurance plan: UHC Medicare  Does the patient qualify for medication assistance through manufacturers or grants?   Yes  Outpatient Pharmacy:  Prior to admission outpatient pharmacy:  [id number] - [place], [place] - [address]   Is the patient willing to use [facility] TOC pharmacy at discharge? Yes Is the patient willing to transition their outpatient pharmacy to utilize a [facility] outpatient pharmacy?   No    Assessment: 1. Acute on chronic HFpEF (LVEF 60-65%). NYHA class III symptoms. -Strict I/Os and daily weights. Keep K>4, Mg>2 -Continue Furosemide 40 mg IV Q12H -Continue Lisinopril 10 mg daily -Continue Metoprolol 50 mg QD - Stop Amlodipine 5 mg for concern for hypotension   Plan: 1) Medication changes recommended at this time: -Stop Amlodipine 5 mg for concern for  hypotension   2) Patient assistance: -She is agreeable to using [facility] TOC pharmacy at discharge  3)  Education  - To be completed prior to discharge  Sofie Rower, PharmD [employer] PGY-1

## 2023-06-20 ENCOUNTER — Other Ambulatory Visit: Payer: Self-pay

## 2023-06-20 DIAGNOSIS — J449 Chronic obstructive pulmonary disease, unspecified: Secondary | ICD-10-CM

## 2023-06-20 DIAGNOSIS — I5033 Acute on chronic diastolic (congestive) heart failure: Secondary | ICD-10-CM | POA: Insufficient documentation

## 2023-06-20 DIAGNOSIS — N179 Acute kidney failure, unspecified: Secondary | ICD-10-CM | POA: Diagnosis not present

## 2023-06-20 DIAGNOSIS — I509 Heart failure, unspecified: Secondary | ICD-10-CM

## 2023-06-20 DIAGNOSIS — J9601 Acute respiratory failure with hypoxia: Secondary | ICD-10-CM | POA: Diagnosis not present

## 2023-06-20 DIAGNOSIS — R339 Retention of urine, unspecified: Secondary | ICD-10-CM

## 2023-06-20 LAB — BASIC METABOLIC PANEL
Anion gap: 7 (ref 5–15)
BUN: 36 mg/dL — ABNORMAL HIGH (ref 8–23)
CO2: 35 mmol/L — ABNORMAL HIGH (ref 22–32)
Calcium: 8.7 mg/dL — ABNORMAL LOW (ref 8.9–10.3)
Chloride: 97 mmol/L — ABNORMAL LOW (ref 98–111)
Creatinine, Ser: 1.58 mg/dL — ABNORMAL HIGH (ref 0.44–1.00)
GFR, Estimated: 34 mL/min — ABNORMAL LOW (ref >60)
Glucose, Bld: 108 mg/dL — ABNORMAL HIGH (ref 70–99)
Potassium: 4.9 mmol/L (ref 3.5–5.1)
Sodium: 139 mmol/L (ref 135–145)

## 2023-06-20 LAB — LACTIC ACID, PLASMA: Lactic Acid, Venous: 1.5 mmol/L (ref 0.5–1.9)

## 2023-06-20 MED ORDER — ENOXAPARIN SODIUM 30 MG/0.3ML IJ SOSY
30.0000 mg | PREFILLED_SYRINGE | INTRAMUSCULAR | Status: DC
  Administered 2023-06-20 – 2023-06-21 (×2): 30 mg via SUBCUTANEOUS
  Filled 2023-06-20 (×2): qty 0.3

## 2023-06-20 MED ORDER — SODIUM CHLORIDE 0.9 % IV BOLUS
500.0000 mL | Freq: Once | INTRAVENOUS | Status: AC | PRN
  Administered 2023-06-20: 500 mL via INTRAVENOUS

## 2023-06-20 MED ORDER — SODIUM CHLORIDE 0.9 % IV BOLUS: 250.0000 mL | Freq: Once | INTRAVENOUS | Status: DC | PRN

## 2023-06-20 MED ORDER — ALBUMIN HUMAN 5 % IV SOLN
25.0000 g | Freq: Once | INTRAVENOUS | Status: DC
  Filled 2023-06-20: qty 500

## 2023-06-20 MED ORDER — FLECAINIDE ACETATE 50 MG PO TABS
50.0000 mg | ORAL_TABLET | Freq: Two times a day (BID) | ORAL | Status: DC
  Filled 2023-06-20: qty 1

## 2023-06-20 MED ORDER — ALPRAZOLAM 0.5 MG PO TABS
0.5000 mg | ORAL_TABLET | Freq: Two times a day (BID) | ORAL | Status: DC | PRN
  Administered 2023-06-21 – 2023-06-29 (×11): 0.5 mg via ORAL
  Filled 2023-06-20 (×11): qty 1

## 2023-06-20 NOTE — Progress Notes (Addendum)
 PROGRESS NOTE    [name] [name]  VWU:[id number] DOB: Jan 31, 1951 DOA: 06/18/2023 PCP: [name], MD   Brief Narrative:  [name] [name] is a 73 y.o. 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   Patient reports chronic shortness of breath and nonproductive cough at her baseline due to her underlying COPD.  3 days ago she began to experience progressively worsening shortness of breath occurring with minimal activity.  She denies any worsening cough.  Dyspnea was mostly exertional.   She has noticed some increased swelling in both her legs.  She denies chest pain, fevers, chills, diaphoresis.  She reports good urine output.   Patient states that her significant other recently passed away 2 weeks ago.  She is living at home with a new roommate now.  She states that she quit smoking years ago.   SpO2 86% on room air subsequently placed on 6 L O2 via [place] with SpO2 98%.  Due to increased work of breathing patient was placed on BiPAP afterwards.  COVID, flu and RSV negative.Portable chest x-ray showed bilateral lower lobe airspace opacities. CTA chest ruled out PE.  BNP slightly elevated around 700 but no previous baseline known. Patient was given IV Lasix 40 mg.  Admitted to hospital service.  Assessment & Plan:   Principal Problem:   Acute respiratory failure with hypoxia (HCC) Active Problems:   Hyperlipemia, mixed   Depression, major, single episode, moderate (HCC)   Essential hypertension   COPD (chronic obstructive pulmonary disease) with chronic bronchitis (HCC)   Coronary artery disease involving native coronary artery of native heart with angina pectoris (HCC)   Chronic kidney disease, stage 3a (HCC)  Acute respiratory failure with hypoxia secondary to acute on chronic congestive heart failure with preserved ejection fraction: Last  echo completed in 2020 shows normal ejection fraction and grade 1 diastolic dysfunction.  Upon arrival, patient was hypoxic, requiring BiPAP and eventually weaned to 4 L of oxygen.  Although chest x-ray shows possibility of bilateral lower lobe airspace opacities however patient with no fever and no leukocytosis as well as chronic fever, does not appear to have pneumonia clinically.  CTA negative for PE, scattered atelectasis and known scattered pulmonary nodules with no significant change from 2022.  Given stability over time, 1 year follow-up chest CT is recommended for high-risk patients. BNP elevated at 715 however no prior baseline known, she was thought to be having CHF exacerbation, started on IV Lasix 40 mg twice daily.  Has diuresed only 2.1 L.  Bladder scan done and she is having 580 cc.  Will do straight cath.  Echo was repeated which shows right heart failure with normal ejection fraction and grade 1 diastolic dysfunction, now that she is not improving, I have consulted cardiology for their assistance.    COPD: No wheezing.  This is appears to be stable.  Continue bronchodilators.  CAD/elevated troponin: Denies chest pain.  Troponin elevated slightly around 100 but stable, does not indicate ACS.  Echo ruled out WMA.  History of frequent symptomatic PVCs: Continue flecainide and Toprol-XL.   Hypertension/now hypotensive: Patient hypotensive this morning.  Checked her lactic acid which was normal.  When I saw her, she was having low blood pressure but completely asymptomatic.  Blood pressure improving.  Advised RN to hold Toprol-XL, amlodipine, lisinopril for now.   CKD stage IIIa: Renal function  is stable.  Continue to monitor.   Hyperlipidemia: Not currently on statin.   Depression/anxiety: Continue Abilify.  CKD stage IIIb: Baseline creatinine around 1.2-1.3.  Slight rise in creatinine to 1.58.  Avoid nephrotoxic agents and monitor closely.  DVT prophylaxis: enoxaparin (LOVENOX)  injection 40 mg Start: 06/18/23 2030   Code Status: Do not attempt resuscitation (DNR) PRE-ARREST INTERVENTIONS DESIRED  Family Communication: Niece and her friend present at bedside.  Plan of care discussed with patient in length and he/she verbalized understanding and agreed with it.   Status is: Inpatient Remains inpatient appropriate because: Needs inpatient management.     Estimated body mass index is 37.42 kg/m as calculated from the following:   Height as of this encounter: 5' (1.524 m).   Weight as of this encounter: 86.9 kg.    Nutritional Assessment: Body mass index is 37.42 kg/m.Marland Kitchen Seen by dietician.  I agree with the assessment and plan as outlined below: Nutrition Status:        . Skin Assessment: I have examined the patient's skin and I agree with the wound assessment as performed by the wound care RN as outlined below:    Consultants:  None  Procedures:  None  Antimicrobials:  Anti-infectives (From admission, onward)    None         Subjective: Seen and examined, niece at the bedside.  Patient still complains of shortness of breath with no improvement compared to yesterday.  Objective: Vitals:   06/20/23 0757 06/20/23 0759 06/20/23 0804 06/20/23 0806  BP: (!) 88/58 (!) 84/59 (!) 83/46 (!) 84/42  Pulse: 78 74 79 80  Resp: 13 15 15 20   Temp:    98.4 F (36.9 C)  TempSrc:    Oral  SpO2: (!) 89% (!) 88% 90% 93%  Weight:      Height:        Intake/Output Summary (Last 24 hours) at 06/20/2023 1044 Last data filed at 06/20/2023 0655 Gross per 24 hour  Intake 150 ml  Output 500 ml  Net -350 ml   Filed Weights   06/18/23 1556 06/20/23 0500  Weight: 86.6 kg 86.9 kg    Examination:  General exam: Appears calm and comfortable  Respiratory system: Diminished breath sounds, no rhonchi or crackles.  Respiratory effort normal. Cardiovascular system: S1 & S2 heard, RRR. No JVD, murmurs, rubs, gallops or clicks. No pedal  edema. Gastrointestinal system: Abdomen is nondistended, soft and nontender. No organomegaly or masses felt. Normal bowel sounds heard. Central nervous system: Alert and oriented. No focal neurological deficits. Extremities: Symmetric 5 x 5 power. Skin: No rashes, lesions or ulcers.  Psychiatry: Judgement and insight appear normal. Mood & affect appropriate.   Data Reviewed: I have personally reviewed following labs and imaging studies  CBC: Recent Labs  Lab 06/18/23 1648 06/19/23 0500  WBC 7.2 7.8  NEUTROABS 5.3  --   HGB 14.0 13.9  HCT 46.0 44.8  MCV 98.7 97.6  PLT 304 326   Basic Metabolic Panel: Recent Labs  Lab 06/18/23 1648 06/19/23 0500 06/20/23 0246  NA 136 137 139  K 4.8 4.7 4.9  CL 97* 100 97*  CO2 27 29 35*  GLUCOSE 111* 100* 108*  BUN 20 23 36*  CREATININE 1.08* 1.25* 1.58*  CALCIUM 8.8* 8.6* 8.7*  MG  --  2.0  --    GFR: Estimated Creatinine Clearance: 30.6 mL/min (A) (by C-G formula based on SCr of 1.58 mg/dL (H)). Liver Function Tests: Recent Labs  Lab  06/18/23 1648  AST 20  ALT 16  ALKPHOS 54  BILITOT 0.6  PROT 6.8  ALBUMIN 3.5   No results for input(s): "LIPASE", "AMYLASE" in the last 168 hours. No results for input(s): "AMMONIA" in the last 168 hours. Coagulation Profile: No results for input(s): "INR", "PROTIME" in the last 168 hours. Cardiac Enzymes: No results for input(s): "CKTOTAL", "CKMB", "CKMBINDEX", "TROPONINI" in the last 168 hours. BNP (last 3 results) No results for input(s): "PROBNP" in the last 8760 hours. HbA1C: No results for input(s): "HGBA1C" in the last 72 hours. CBG: No results for input(s): "GLUCAP" in the last 168 hours. Lipid Profile: No results for input(s): "CHOL", "HDL", "LDLCALC", "TRIG", "CHOLHDL", "LDLDIRECT" in the last 72 hours. Thyroid Function Tests: No results for input(s): "TSH", "T4TOTAL", "FREET4", "T3FREE", "THYROIDAB" in the last 72 hours. Anemia Panel: No results for input(s): "VITAMINB12",  "FOLATE", "FERRITIN", "TIBC", "IRON", "RETICCTPCT" in the last 72 hours. Sepsis Labs: Recent Labs  Lab 06/20/23 0844  LATICACIDVEN 1.5    Recent Results (from the past 240 hours)  Resp panel by RT-PCR (RSV, Flu A&B, Covid) Anterior Nasal Swab     Status: None   Collection Time: 06/18/23  3:57 PM   Specimen: Anterior Nasal Swab  Result Value Ref Range Status   SARS Coronavirus 2 by RT PCR NEGATIVE NEGATIVE Final   Influenza A by PCR NEGATIVE NEGATIVE Final   Influenza B by PCR NEGATIVE NEGATIVE Final    Comment: (NOTE) The Xpert Xpress SARS-CoV-2/FLU/RSV plus assay is intended as an aid in the diagnosis of influenza from Nasopharyngeal swab specimens and should not be used as a sole basis for treatment. Nasal washings and aspirates are unacceptable for Xpert Xpress SARS-CoV-2/FLU/RSV testing.  Fact Sheet for Patients: BloggerCourse.com  Fact Sheet for Healthcare Providers: SeriousBroker.it  This test is not yet approved or cleared by the [place] FDA and has been authorized for detection and/or diagnosis of SARS-CoV-2 by FDA under an Emergency Use Authorization (EUA). This EUA will remain in effect (meaning this test can be used) for the duration of the COVID-19 declaration under Section 564(b)(1) of the Act, 21 U.S.C. section 360bbb-3(b)(1), unless the authorization is terminated or revoked.     Resp Syncytial Virus by PCR NEGATIVE NEGATIVE Final    Comment: (NOTE) Fact Sheet for Patients: BloggerCourse.com  Fact Sheet for Healthcare Providers: SeriousBroker.it  This test is not yet approved or cleared by the [place] FDA and has been authorized for detection and/or diagnosis of SARS-CoV-2 by FDA under an Emergency Use Authorization (EUA). This EUA will remain in effect (meaning this test can be used) for the duration of the COVID-19 declaration under Section  564(b)(1) of the Act, 21 U.S.C. section 360bbb-3(b)(1), unless the authorization is terminated or revoked.  Performed at [facility], [address] [place], [place] [address]      Radiology Studies: ECHOCARDIOGRAM COMPLETE Result Date: 06/19/2023    ECHOCARDIOGRAM REPORT   Patient Name:   GEOFFREY MANKIN Date of Exam: 06/19/2023 Medical Rec #:  [id number]    Height:       60.0 in Accession #:    [id number]   Weight:       82.2 lb Date of Birth:  04-Oct-1950    BSA:          1.279 m Patient Age:    72 years     BP:           104/91 mmHg Patient Gender: F  HR:           95 bpm. Exam Location:  Inpatient Procedure: 2D Echo, Cardiac Doppler and Color Doppler (Both Spectral and Color            Flow Doppler were utilized during procedure). Indications:    Dyspnea  History:        Patient has prior history of Echocardiogram examinations, most                 recent 02/14/2019. CAD, COPD; Risk Factors:Hypertension,                 Diabetes and Dyslipidemia.  Sonographer:    Meagan Baucom RDCS, FE, PE Referring Phys: Darreld Mclean MD IMPRESSIONS  1. Left ventricular ejection fraction, by estimation, is 60 to 65%. The left ventricle has normal function. The left ventricle has no regional wall motion abnormalities. Left ventricular diastolic parameters are consistent with Grade I diastolic dysfunction (impaired relaxation). There is the interventricular septum is flattened in systole and diastole, consistent with right ventricular pressure and volume overload.  2. Right ventricular systolic function is mildly reduced. The right ventricular size is normal.  3. The mitral valve is normal in structure. No evidence of mitral valve regurgitation. No evidence of mitral stenosis.  4. The aortic valve is tricuspid. Aortic valve regurgitation is trivial. Aortic valve sclerosis is present, with no evidence of aortic valve stenosis.  5. The inferior vena cava is normal in size with greater than 50% respiratory  variability, suggesting right atrial pressure of 3 mmHg. FINDINGS  Left Ventricle: Left ventricular ejection fraction, by estimation, is 60 to 65%. The left ventricle has normal function. The left ventricle has no regional wall motion abnormalities. The left ventricular internal cavity size was normal in size. There is  no left ventricular hypertrophy. The interventricular septum is flattened in systole and diastole, consistent with right ventricular pressure and volume overload. Left ventricular diastolic parameters are consistent with Grade I diastolic dysfunction (impaired relaxation). Right Ventricle: The right ventricular size is normal. Right ventricular systolic function is mildly reduced. Left Atrium: Left atrial size was normal in size. Right Atrium: Right atrial size was normal in size. Pericardium: Trivial pericardial effusion is present. Mitral Valve: The mitral valve is normal in structure. No evidence of mitral valve regurgitation. No evidence of mitral valve stenosis. Tricuspid Valve: The tricuspid valve is normal in structure. Tricuspid valve regurgitation is trivial. No evidence of tricuspid stenosis. Aortic Valve: The aortic valve is tricuspid. Aortic valve regurgitation is trivial. Aortic valve sclerosis is present, with no evidence of aortic valve stenosis. Aortic valve mean gradient measures 7.0 mmHg. Aortic valve peak gradient measures 12.7 mmHg.  Aortic valve area, by VTI measures 2.58 cm. Pulmonic Valve: The pulmonic valve was normal in structure. Pulmonic valve regurgitation is not visualized. No evidence of pulmonic stenosis. Aorta: The aortic root is normal in size and structure. Venous: The inferior vena cava is normal in size with greater than 50% respiratory variability, suggesting right atrial pressure of 3 mmHg. IAS/Shunts: No atrial level shunt detected by color flow Doppler.  LEFT VENTRICLE PLAX 2D LVIDd:         4.70 cm   Diastology LVIDs:         3.00 cm   LV e' medial:    5.66  cm/s LV PW:         1.10 cm   LV E/e' medial:  12.9 LV IVS:  1.10 cm   LV e' lateral:   11.10 cm/s LVOT diam:     2.20 cm   LV E/e' lateral: 6.6 LV SV:         74 LV SV Index:   58 LVOT Area:     3.80 cm  RIGHT VENTRICLE RV Basal diam:  2.30 cm RV Mid diam:    1.90 cm RV S prime:     12.60 cm/s TAPSE (M-mode): 1.2 cm LEFT ATRIUM             Index        RIGHT ATRIUM           Index LA diam:        3.20 cm 2.50 cm/m   RA Area:     13.50 cm LA Vol (A2C):   35.3 ml 27.59 ml/m  RA Volume:   27.70 ml  21.65 ml/m LA Vol (A4C):   36.7 ml 28.69 ml/m LA Biplane Vol: 37.7 ml 29.47 ml/m  AORTIC VALVE AV Area (Vmax):    2.65 cm AV Area (Vmean):   2.49 cm AV Area (VTI):     2.58 cm AV Vmax:           178.00 cm/s AV Vmean:          116.000 cm/s AV VTI:            0.286 m AV Peak Grad:      12.7 mmHg AV Mean Grad:      7.0 mmHg LVOT Vmax:         124.00 cm/s LVOT Vmean:        76.000 cm/s LVOT VTI:          0.194 m LVOT/AV VTI ratio: 0.68  AORTA Ao Root diam: 3.10 cm Ao Asc diam:  3.50 cm MITRAL VALVE MV Area (PHT): 4.63 cm    SHUNTS MV Decel Time: 164 msec    Systemic VTI:  0.19 m MV E velocity: 72.80 cm/s  Systemic Diam: 2.20 cm MV A velocity: 95.90 cm/s MV E/A ratio:  0.76 Olga Millers MD Electronically signed by Olga Millers MD Signature Date/Time: 06/19/2023/11:48:10 AM    Final    CT Angio Chest PE W and/or Wo Contrast Result Date: 06/18/2023 CLINICAL DATA:  Pulmonary embolism suspected, high probability. Shortness of breath. EXAM: CT ANGIOGRAPHY CHEST WITH CONTRAST TECHNIQUE: Multidetector CT imaging of the chest was performed using the standard protocol during bolus administration of intravenous contrast. Multiplanar CT image reconstructions and MIPs were obtained to evaluate the vascular anatomy. RADIATION DOSE REDUCTION: This exam was performed according to the departmental dose-optimization program which includes automated exposure control, adjustment of the mA and/or kV according to patient size  and/or use of iterative reconstruction technique. CONTRAST:  75mL OMNIPAQUE IOHEXOL 350 MG/ML SOLN COMPARISON:  09/11/2021, 04/13/2021. FINDINGS: Cardiovascular: The heart is enlarged and there is no pericardial effusion. Multi-vessel coronary artery calcifications are noted. 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 The pulmonary trunk is mildly distended which may be associated with underlying pulmonary artery hypertension. No pulmonary embolism is seen. Evaluation of the pulmonary arteries at the lung bases is limited due to respiratory motion artifact. Mediastinum/Nodes: No enlarged mediastinal, hilar, or axillary lymph nodes. Thyroid gland, trachea, and esophagus demonstrate no significant findings. Lungs/Pleura: Scattered atelectasis or scarring is noted bilaterally. There are small bilateral pleural effusions with dependent atelectasis in the lower lobes. A nodular opacity is  noted in the right middle lobe measuring 1.1 cm, versus 1.0 cm on the previous exam. There is a 7 mm nodule in the left lower lobe, versus 6 mm on the previous exam, axial image 72. No new nodule is seen. No pneumothorax. Upper Abdomen: There is thickening of the left adrenal gland without evidence of discrete nodule. An exophytic hypodensity is noted in the mid right kidney, previously characterized as cyst. No acute abnormality. Musculoskeletal: Degenerative changes are present in the thoracic spine. No acute osseous abnormality. Review of the MIP images confirms the above findings. IMPRESSION: 1. No evidence of pulmonary embolism. 2. Scattered atelectasis or scarring bilaterally. 3. Small bilateral pleural effusions. 4. Scattered pulmonary nodules bilaterally measuring up to 1.1 cm, not significantly changed from 2022. Given stability over time, 1 year follow-up chest CT is recommended for high-risk patients. 5. Multi-vessel coronary artery calcifications.  6. 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 Recommend annual imaging followup by CTA or MRA. This recommendation follows 2010 ACCF/AHA/AATS/ACR/ASA/SCA/SCAI/SIR/STS/SVM Guidelines for the Diagnosis and Management of Patients with Thoracic Aortic Disease. Circulation. 2010; 121: W098-J191. Aortic aneurysm NOS (ICD10-I71.9) Electronically Signed   By: Thornell Sartorius M.D.   On: 06/18/2023 22:25   DG Chest Portable 1 View Result Date: 06/18/2023 CLINICAL DATA:  Shortness of breath EXAM: PORTABLE CHEST 1 VIEW COMPARISON:  05/14/2015 FINDINGS: Heart and mediastinal contours within normal limits. Aortic atherosclerosis. Bilateral lower lobe airspace opacities. No visible effusions. No acute bony abnormality. IMPRESSION: Bilateral lower lobe airspace opacities could reflect atelectasis or pneumonia. Electronically Signed   By: Charlett Nose M.D.   On: 06/18/2023 17:15    Scheduled Meds:  amLODipine  5 mg Oral Daily   ARIPiprazole  2 mg Oral Daily   aspirin EC  81 mg Oral Daily   enoxaparin (LOVENOX) injection  40 mg Subcutaneous Q24H   flecainide  50 mg Oral TID   furosemide  40 mg Intravenous Q12H   lisinopril  10 mg Oral Daily   metoprolol succinate  50 mg Oral Daily   potassium chloride  10 mEq Oral Daily   sodium chloride flush  3 mL Intravenous Q12H   Continuous Infusions:  promethazine (PHENERGAN) injection (IM or IVPB)       LOS: 1 day   Hughie Closs, MD [facility]  06/20/2023, 10:44 AM   *Please note that this is a verbal dictation therefore any spelling or grammatical errors are due to the "Dragon Medical One" system interpretation.  Please page via Amion and do not message via secure chat for urgent patient care matters. Secure chat can be used for non urgent patient care matters.  How to contact the [facility] Attending or Consulting provider 7A - 7P or covering provider during after hours 7P -7A, for this patient?  Check the care  team in [facility] and look for a) attending/consulting TR[facility] provider listed and b) the [facility] team listed. Page or secure chat 7A-7P. Log into [email or web address] and use [place]'s universal password to access. If you do not have the password, please contact the hospital operator. Locate the [facility] provider you are looking for under [facility] and page to a number that you can be directly reached. If you still have difficulty reaching the provider, please page the [facility] (Director on Call) for the Hospitalists listed on amion for assistance.

## 2023-06-20 NOTE — Significant Event (Signed)
 Rapid Response Event Note   Reason for Call :  Rounding on unit, asked to see patient for hypotension  Initial Focused Assessment:  While rounding on unit I was asked to see patient due to hypotension SBP 70s. Patient is A&O, asymptomatic; complaints of generalized weakness, worse today than previously. Lungs diminished throughout. Edema noted, BLE > BUE. Attempted lying patient supine, unable to tolerate as patient states "too hard to breath."  76/52 (60) HR 76  RR 20-30 O2 88-94% 4L [place]  Interventions/Plan of Care:  Bladder scan: I&O cath: 800 ml urine obtained NS bolus if needed  Event Summary:  MD Notified: Estill Cotta MD Call Time: 6606 Arrival Time: 1028 End Time: 1100  Truddie Crumble, RN

## 2023-06-20 NOTE — Plan of Care (Signed)
   Problem: Clinical Measurements: Goal: Will remain free from infection Outcome: Progressing   Problem: Clinical Measurements: Goal: Diagnostic test results will improve Outcome: Progressing   Problem: Clinical Measurements: Goal: Respiratory complications will improve Outcome: Progressing   Problem: Clinical Measurements: Goal: Cardiovascular complication will be avoided Outcome: Progressing

## 2023-06-20 NOTE — Progress Notes (Addendum)
   06/20/23 0806  Vitals  Temp 98.4 F (36.9 C)  Temp Source Oral  BP (!) 84/42  MAP (mmHg) (!) 57  BP Location Left Arm  BP Method Automatic  Patient Position (if appropriate) Lying  Pulse Rate 80  Pulse Rate Source Monitor  ECG Heart Rate 84  Resp 20  Level of Consciousness  Level of Consciousness Alert  MEWS COLOR  MEWS Score Color Green  Oxygen Therapy  SpO2 93 %  O2 Device Nasal Cannula  O2 Flow Rate (L/min) 4 L/min  Patient Activity (if Appropriate) In bed  PCA/Epidural/Spinal Assessment  Respiratory Pattern Dyspnea with exertion;Regular  Glasgow Coma Scale  Eye Opening 4  Best Verbal Response (NON-intubated) 5  Best Motor Response 6  Glasgow Coma Scale Score 15  MEWS Score  MEWS Temp 0  MEWS Systolic 1  MEWS Pulse 0  MEWS RR 0  MEWS LOC 0  MEWS Score 1  Provider Notification  Provider Name/Title Pahwani MD  Date Provider Notified 06/20/23  Time Provider Notified [phone]   MD notified. All scheduled anti- hypertensive meds held. Patient asymptomatic. Charge RN made aware. Call bell within reach.

## 2023-06-20 NOTE — Significant Event (Signed)
 Rapid Response Event Note   Reason for Call :  Hypotension SBP 40s (recheck), pt symptomatic at this time. Gave order to start NS bolus until arrival, place pt trendelenburg.   Initial Focused Assessment:  Upon arrival to room patient increased pallor than previous assessment, endorses "something doesn't feel right." NS bolus already infusing. Cardiology paged, NP to bedside. Primary TRH MD paged. Patient was scheduled for heart cath today since this AM assessment--this will now be on hold per cardiology.   63/54 (59) automatic HR 71 RR 30 O2 91% 4L [place]  Interventions/Plan of Care:  500 NS bolus (already started) Trendelenburg position Manual BP 82/54 Cards NP to bedside CCM consult  89/54 (66) HR 75 RR 29 O2 98% 4L [place]  Event Summary:  MD Notified: [name] Bath MD, [name] Echevaria NP Call Time: 1308 Arrival Time: [phone] End Time:   [name] Crumble, RN

## 2023-06-20 NOTE — Consult Note (Addendum)
 Cardiology Consultation   Patient ID: Younique Casad MRN: [id number]; DOB: October 23, 1950  Admit date: 06/18/2023 Date of Consult: 06/20/2023  PCP:  [name], MD   [facility] Providers Cardiologist:  Verne Carrow, MD       Patient Profile:   [name] [name] is a 73 y.o. female with a hx of CAD, HTN, HLD, CKD IIIa, COPD, OA who is being seen 06/20/2023 for the evaluation of CHF at the request of Dr. Jacqulyn Bath.    History of Present Illness:   [name] [name] is a 73 year old female with past medical history noted above.  She has been followed by Dr. Clifton James as an outpatient.  She was initially seen in May 2015 for evaluation of dyspnea and weakness, had been on beta-blockers for many years for palpitations.  Echocardiogram 09/2013 showed normal LV size and function, bicuspid aortic valve, mildly dilated aortic root and due to shadow in the aorta a CT chest was arranged to exclude dissection.  Follow-up CT stable with no dissection.  Catheterization 04/2014 that showed moderate mid LAD stenosis which was not flow-limiting by FFR, circumflex and RCA with minimal disease.  She was seen again 06/2014 with palpitations, associated dizziness and weakness with bradycardia.  Seen later by Dr. Johney Frame and started on flecainide.  Underwent repeat cardiac catheterization 12/2015 with 50% mid LAD stenosis, mild disease of RCA and circumflex.  Reported having a sleep study done 06/2016 and stated this was normal.  Echo 02/2019 with LVEF of 60 to 65%, no valve insufficiency or stenosis.   Last seen in the office 06/04/2023 for follow-up with Dr. Clifton James.  She reported dyspnea on minimal exertion, or episodes of chest pain.  Options were discussed regarding possible arrangement of cardiac catheterization in the setting of her dyspnea but she refused at that time.  She was continued on flecainide, metoprolol and Imdur 30 mg daily was added.  Presented to the ED on 3/17 with complaints of  increasing shortness of breath. Reports worsening dyspnea with minimal exertion. Denied any chest pain. On EMS arrival  her sats were noted at 86% on RA, improved to 94% on 5L.   In the ED her labs showed sodium 136, potassium 4.8, creatinine 1.08, BNP 715, high-sensitivity troponin 97>>104, WBC 7.2, hemoglobin 14.  EKG showed sinus rhythm, 85 bpm nonspecific changes.  Chest x-ray with bilateral lower lobe airspace opacities, atelectasis versus pneumonia.  CT chest negative for PE, small bilateral pleural effusions, scattered pulmonary nodules measuring up to 1.1 cm unchanged from 2022, multivessel coronary artery calcifications.  Admitted to internal medicine for further management.  Echocardiogram 3/18 with LVEF of 60 to 65%, no regional wall motion abnormality, grade 1 diastolic dysfunction, mildly reduced RV, interventricular septum flattening consistent with RV pressure and volume overload, trivial aortic regurg. Cardiology consulted for further evaluation.   Past Medical History:  Diagnosis Date   Allergic rhinitis    ALLERGIC RHINITIS 05/08/2007   Qualifier: Diagnosis of  By: Linna Darner, CMA, Cindy     Allergy    seasonal   Anxiety    Anxiety state 05/25/2014   Aortic valve disease    a. ? possible bicuspid AV.   CAD (coronary artery disease)    a. nonobstructive by prior cath   Cataract    bilateral   Chronic low back pain 03/09/2016   Complication of anesthesia 2003   Medication for block "went up to my brain and I stopped breathing"   COPD (chronic obstructive pulmonary disease) (  HCC)    COPD (chronic obstructive pulmonary disease) with chronic bronchitis (HCC) 10/12/2014   Depression    Depression, major, single episode, moderate (HCC) 05/08/2007   Qualifier: Diagnosis of  By: Linna Darner, CMA, Cindy     Dexamethasone adverse reaction    2002 normal   Dilated aortic root (HCC)    a. mild dilated aortic root on prior echo, ascending aorta prominence on CT 2015.   Duodenal mass    GERD  05/08/2007   Qualifier: Diagnosis of  By: Linna Darner, CMA, Cindy     Hyperlipidemia    Hypertension    Insomnia    Neck pain, chronic 03/09/2016   NICOTINE ADDICTION 05/08/2007   Qualifier: Diagnosis of  By: Clent Ridges MD, Jeannett Senior A    Osteoarthritis    Overactive bladder    Pneumonia    once or twivce in past   Premature ventricular contractions    LBBB inferior axis PVCs   Thoracic back pain 05/30/2014   Tobacco abuse    Ulcer    duodenal    Past Surgical History:  Procedure Laterality Date   BREAST BIOPSY     CARDIAC CATHETERIZATION N/A 12/03/2015   Procedure: Left Heart Cath and Coronary Angiography;  Surgeon: Kathleene Hazel, MD;  Location: [facility] INVASIVE CV LAB;  Service: Cardiovascular;  Laterality: N/A;   CARPAL TUNNEL RELEASE Left    1997 left,     CHOLECYSTECTOMY N/A 03/23/2015   Procedure: LAPAROSCOPIC CHOLECYSTECTOMY;  Surgeon: Axel Filler, MD;  Location: [facility] OR;  Service: General;  Laterality: N/A;   COLONOSCOPY  10/17/2013   per Dr. Juanda Chance, benign polyps, repeat in 10 years   EUS N/A 11/12/2014   Procedure: UPPER ENDOSCOPIC ULTRASOUND (EUS) LINEAR;  Surgeon: Rachael Fee, MD;  Location: [facility] ENDOSCOPY;  Service: Endoscopy;  Laterality: N/A;   EUS N/A 06/08/2016   Procedure: UPPER ENDOSCOPIC ULTRASOUND (EUS) RADIAL;  Surgeon: Rachael Fee, MD;  Location: [facility] ENDOSCOPY;  Service: Endoscopy;  Laterality: N/A;   EYE SURGERY     LEFT AND RIGHT HEART CATHETERIZATION WITH CORONARY ANGIOGRAM N/A 04/22/2014   Procedure: LEFT AND RIGHT HEART CATHETERIZATION WITH CORONARY ANGIOGRAM;  Surgeon: Kathleene Hazel, MD;  Location: [facility] CATH LAB;  Service: Cardiovascular;  Laterality: N/A;   TONSILLECTOMY  1950   torn cartilage repaired rt wrist Right 2003    Inpatient Medications: Scheduled Meds:  amLODipine  5 mg Oral Daily   ARIPiprazole  2 mg Oral Daily   aspirin EC  81 mg Oral Daily   enoxaparin (LOVENOX) injection  40 mg Subcutaneous Q24H   flecainide  50 mg Oral TID    furosemide  40 mg Intravenous Q12H   lisinopril  10 mg Oral Daily   metoprolol succinate  50 mg Oral Daily   potassium chloride  10 mEq Oral Daily   sodium chloride flush  3 mL Intravenous Q12H   Continuous Infusions:  promethazine (PHENERGAN) injection (IM or IVPB)     PRN Meds: acetaminophen **OR** acetaminophen, bisacodyl, ipratropium-albuterol, melatonin, promethazine (PHENERGAN) injection (IM or IVPB), senna-docusate  Allergies:    Allergies  Allergen Reactions   Augmentin [Amoxicillin-Pot Clavulanate] Nausea And Vomiting   Clindamycin/Lincomycin Nausea And Vomiting   Colchicine Diarrhea   Flecainide Other (See Comments)    Conjunctivitis    Hydroxychloroquine Rash    Social History:   Social History   Socioeconomic History   Marital status: Single    Spouse name: Not on file   Number of children: 0  Years of education: Not on file   Highest education level: Not on file  Occupational History   Occupation: CNA  Tobacco Use   Smoking status: Former    Current packs/day: 0.00    Average packs/day: 0.5 packs/day for 54.0 years (24.3 ttl pk-yrs)    Types: Cigarettes    Start date: 01/02/1963    Quit date: 01/01/2017    Years since quitting: 6.4   Smokeless tobacco: Never   Tobacco comments:    1/2 pack per day or less  Vaping Use   Vaping status: Never Used  Substance and Sexual Activity   Alcohol use: No    Alcohol/week: 0.0 standard drinks of alcohol   Drug use: No   Sexual activity: Not on file  Other Topics Concern   Not on file  Social History Narrative   Works as Lawyer at [employer].   Social Drivers of Corporate investment banker Strain: Low Risk  (05/14/2023)   Overall Financial Resource Strain (CARDIA)    Difficulty of Paying Living Expenses: Not hard at all  Food Insecurity: No Food Insecurity (06/19/2023)   Hunger Vital Sign    Worried About Running Out of Food in the Last Year: Never true    Ran Out of Food in the Last Year: Never true   Transportation Needs: No Transportation Needs (06/19/2023)   PRAPARE - Administrator, Civil Service (Medical): No    Lack of Transportation (Non-Medical): No  Physical Activity: Inactive (05/14/2023)   Exercise Vital Sign    Days of Exercise per Week: 0 days    Minutes of Exercise per Session: 0 min  Stress: No Stress Concern Present (05/14/2023)   [employer] of Occupational Health - Occupational Stress Questionnaire    Feeling of Stress : Not at all  Social Connections: Socially Isolated (06/19/2023)   Social Connection and Isolation Panel [NHANES]    Frequency of Communication with Friends and Family: Once a week    Frequency of Social Gatherings with Friends and Family: Never    Attends Religious Services: Never    Database administrator or Organizations: No    Attends Banker Meetings: Never    Marital Status: Never married  Intimate Partner Violence: Not At Risk (06/19/2023)   Humiliation, Afraid, Rape, and Kick questionnaire    Fear of Current or Ex-Partner: No    Emotionally Abused: No    Physically Abused: No    Sexually Abused: No    Family History:    Family History  Problem Relation Age of Onset   CAD Brother    Kidney disease Brother    Emphysema Mother    Other Father        brain tumor   Throat cancer Brother    Hyperlipidemia Other    Hypertension Other    Kidney disease Other    Heart disease Other    Colon cancer Neg Hx    Esophageal cancer Neg Hx    Stomach cancer Neg Hx    Rectal cancer Neg Hx      ROS:  Please see the history of present illness.   All other ROS reviewed and negative.     Physical Exam/Data:   Vitals:   06/20/23 0759 06/20/23 0804 06/20/23 0806 06/20/23 1030  BP: (!) 84/59 (!) 83/46 (!) 84/42 (!) 76/52  Pulse: 74 79 80 (!) 52  Resp: 15 15 20  (!) 32  Temp:   98.4 F (36.9 C)  TempSrc:   Oral   SpO2: (!) 88% 90% 93% 93%  Weight:      Height:        Intake/Output Summary (Last 24 hours) at  06/20/2023 1050 Last data filed at 06/20/2023 0655 Gross per 24 hour  Intake 150 ml  Output 500 ml  Net -350 ml      06/20/2023    5:00 AM 06/18/2023    3:56 PM 06/04/2023    4:02 PM  Last 3 Weights  Weight (lbs) 191 lb 9.3 oz 191 lb 194 lb 3.2 oz  Weight (kg) 86.9 kg 86.637 kg 88.089 kg     Body mass index is 37.42 kg/m.  General:  Well nourished, mildly dyspneic on 4L [place] HEENT: normal Neck: no JVD Vascular: No carotid bruits; Distal pulses 2+ bilaterally Cardiac:  normal S1, S2; RRR; no murmur  Lungs:  clear to auscultation bilaterally, no wheezing, rhonchi or rales  Abd: soft, nontender, no hepatomegaly  Ext: no edema Musculoskeletal:  No deformities, BUE and BLE strength normal and equal Skin: warm and dry  Neuro:  CNs 2-12 intact, no focal abnormalities noted Psych:  Normal affect   EKG:  The EKG was personally reviewed and demonstrates:  Sinus Rhythm, PVC, nonspecific changes  Telemetry:  Telemetry was personally reviewed and demonstrates: sinus rhythm, freq PVCs, intermittent bigeminy   Relevant CV Studies:  Echo: 06/19/2023  IMPRESSIONS     1. Left ventricular ejection fraction, by estimation, is 60 to 65%. The  left ventricle has normal function. The left ventricle has no regional  wall motion abnormalities. Left ventricular diastolic parameters are  consistent with Grade I diastolic  dysfunction (impaired relaxation). There is the interventricular septum is  flattened in systole and diastole, consistent with right ventricular  pressure and volume overload.   2. Right ventricular systolic function is mildly reduced. The right  ventricular size is normal.   3. The mitral valve is normal in structure. No evidence of mitral valve  regurgitation. No evidence of mitral stenosis.   4. The aortic valve is tricuspid. Aortic valve regurgitation is trivial.  Aortic valve sclerosis is present, with no evidence of aortic valve  stenosis.   5. The inferior vena cava is  normal in size with greater than 50%  respiratory variability, suggesting right atrial pressure of 3 mmHg.   FINDINGS   Left Ventricle: Left ventricular ejection fraction, by estimation, is 60  to 65%. The left ventricle has normal function. The left ventricle has no  regional wall motion abnormalities. The left ventricular internal cavity  size was normal in size. There is   no left ventricular hypertrophy. The interventricular septum is flattened  in systole and diastole, consistent with right ventricular pressure and  volume overload. Left ventricular diastolic parameters are consistent with  Grade I diastolic dysfunction  (impaired relaxation).   Right Ventricle: The right ventricular size is normal. Right ventricular  systolic function is mildly reduced.   Left Atrium: Left atrial size was normal in size.   Right Atrium: Right atrial size was normal in size.   Pericardium: Trivial pericardial effusion is present.   Mitral Valve: The mitral valve is normal in structure. No evidence of  mitral valve regurgitation. No evidence of mitral valve stenosis.   Tricuspid Valve: The tricuspid valve is normal in structure. Tricuspid  valve regurgitation is trivial. No evidence of tricuspid stenosis.   Aortic Valve: The aortic valve is tricuspid. Aortic valve regurgitation is  trivial. Aortic valve sclerosis  is present, with no evidence of aortic  valve stenosis. Aortic valve mean gradient measures 7.0 mmHg. Aortic valve  peak gradient measures 12.7 mmHg.   Aortic valve area, by VTI measures 2.58 cm.   Pulmonic Valve: The pulmonic valve was normal in structure. Pulmonic valve  regurgitation is not visualized. No evidence of pulmonic stenosis.   Aorta: The aortic root is normal in size and structure.   Venous: The inferior vena cava is normal in size with greater than 50%  respiratory variability, suggesting right atrial pressure of 3 mmHg.   IAS/Shunts: No atrial level shunt  detected by color flow Doppler.   Cath: 12/2015   Prox RCA to Mid RCA lesion, 20 %stenosed. Mid RCA to Dist RCA lesion, 20 %stenosed. 2nd Mrg lesion, 20 %stenosed. 1st Mrg lesion, 20 %stenosed. Ost LAD to Prox LAD lesion, 10 %stenosed. Mid LAD-2 lesion, 30 %stenosed. Mid LAD-1 lesion, 50 %stenosed. Dist LAD lesion, 30 %stenosed. The left ventricular systolic function is normal. LV end diastolic pressure is normal. The left ventricular ejection fraction is greater than 65% by visual estimate. There is no mitral valve regurgitation.   1. Mild to moderate non-obstructive CAD 2. Moderate mid LAD stenosis. This is unchanged from last cath in 2016. This does not appear to be flow limiting. (FFR in 2016 of the mid LAD was 0.85). 3. Mild non-obstructive disease in the RCA and Circumflex 4. Normal LV systolic function   Recommendations: Continue medical management of CAD. Smoking cessation recommended.    Laboratory Data:  High Sensitivity Troponin:   Recent Labs  Lab 06/18/23 1648 06/18/23 1800  TROPONINIHS 97* 104*     Chemistry Recent Labs  Lab 06/18/23 1648 06/19/23 0500 06/20/23 0246  NA 136 137 139  K 4.8 4.7 4.9  CL 97* 100 97*  CO2 27 29 35*  GLUCOSE 111* 100* 108*  BUN 20 23 36*  CREATININE 1.08* 1.25* 1.58*  CALCIUM 8.8* 8.6* 8.7*  MG  --  2.0  --   GFRNONAA 54* 45* 34*  ANIONGAP 12 8 7     Recent Labs  Lab 06/18/23 1648  PROT 6.8  ALBUMIN 3.5  AST 20  ALT 16  ALKPHOS 54  BILITOT 0.6   Lipids No results for input(s): "CHOL", "TRIG", "HDL", "LABVLDL", "LDLCALC", "CHOLHDL" in the last 168 hours.  Hematology Recent Labs  Lab 06/18/23 1648 06/19/23 0500  WBC 7.2 7.8  RBC 4.66 4.59  HGB 14.0 13.9  HCT 46.0 44.8  MCV 98.7 97.6  MCH 30.0 30.3  MCHC 30.4 31.0  RDW 13.2 13.4  PLT 304 326   Thyroid No results for input(s): "TSH", "FREET4" in the last 168 hours.  BNP Recent Labs  Lab 06/18/23 1648  BNP 715.9*    DDimer No results for input(s):  "DDIMER" in the last 168 hours.   Radiology/Studies:  ECHOCARDIOGRAM COMPLETE Result Date: 06/19/2023    ECHOCARDIOGRAM REPORT   Patient Name:   [name] [name] Date of Exam: 06/19/2023 Medical Rec #:  [id number]    Height:       60.0 in Accession #:    [id number]   Weight:       82.2 lb Date of Birth:  [birth year]    BSA:          1.279 m Patient Age:    72 years     BP:           104/91 mmHg Patient Gender: F  HR:           95 bpm. Exam Location:  Inpatient Procedure: 2D Echo, Cardiac Doppler and Color Doppler (Both Spectral and Color            Flow Doppler were utilized during procedure). Indications:    Dyspnea  History:        Patient has prior history of Echocardiogram examinations, most                 recent 02/14/2019. CAD, COPD; Risk Factors:Hypertension,                 Diabetes and Dyslipidemia.  Sonographer:    Meagan Baucom RDCS, FE, PE Referring Phys: Darreld Mclean MD IMPRESSIONS  1. Left ventricular ejection fraction, by estimation, is 60 to 65%. The left ventricle has normal function. The left ventricle has no regional wall motion abnormalities. Left ventricular diastolic parameters are consistent with Grade I diastolic dysfunction (impaired relaxation). There is the interventricular septum is flattened in systole and diastole, consistent with right ventricular pressure and volume overload.  2. Right ventricular systolic function is mildly reduced. The right ventricular size is normal.  3. The mitral valve is normal in structure. No evidence of mitral valve regurgitation. No evidence of mitral stenosis.  4. The aortic valve is tricuspid. Aortic valve regurgitation is trivial. Aortic valve sclerosis is present, with no evidence of aortic valve stenosis.  5. The inferior vena cava is normal in size with greater than 50% respiratory variability, suggesting right atrial pressure of 3 mmHg. FINDINGS  Left Ventricle: Left ventricular ejection fraction, by estimation, is 60 to 65%. The left  ventricle has normal function. The left ventricle has no regional wall motion abnormalities. The left ventricular internal cavity size was normal in size. There is  no left ventricular hypertrophy. The interventricular septum is flattened in systole and diastole, consistent with right ventricular pressure and volume overload. Left ventricular diastolic parameters are consistent with Grade I diastolic dysfunction (impaired relaxation). Right Ventricle: The right ventricular size is normal. Right ventricular systolic function is mildly reduced. Left Atrium: Left atrial size was normal in size. Right Atrium: Right atrial size was normal in size. Pericardium: Trivial pericardial effusion is present. Mitral Valve: The mitral valve is normal in structure. No evidence of mitral valve regurgitation. No evidence of mitral valve stenosis. Tricuspid Valve: The tricuspid valve is normal in structure. Tricuspid valve regurgitation is trivial. No evidence of tricuspid stenosis. Aortic Valve: The aortic valve is tricuspid. Aortic valve regurgitation is trivial. Aortic valve sclerosis is present, with no evidence of aortic valve stenosis. Aortic valve mean gradient measures 7.0 mmHg. Aortic valve peak gradient measures 12.7 mmHg.  Aortic valve area, by VTI measures 2.58 cm. Pulmonic Valve: The pulmonic valve was normal in structure. Pulmonic valve regurgitation is not visualized. No evidence of pulmonic stenosis. Aorta: The aortic root is normal in size and structure. Venous: The inferior vena cava is normal in size with greater than 50% respiratory variability, suggesting right atrial pressure of 3 mmHg. IAS/Shunts: No atrial level shunt detected by color flow Doppler.  LEFT VENTRICLE PLAX 2D LVIDd:         4.70 cm   Diastology LVIDs:         3.00 cm   LV e' medial:    5.66 cm/s LV PW:         1.10 cm   LV E/e' medial:  12.9 LV IVS:        1.10  cm   LV e' lateral:   11.10 cm/s LVOT diam:     2.20 cm   LV E/e' lateral: 6.6 LV SV:          74 LV SV Index:   58 LVOT Area:     3.80 cm  RIGHT VENTRICLE RV Basal diam:  2.30 cm RV Mid diam:    1.90 cm RV S prime:     12.60 cm/s TAPSE (M-mode): 1.2 cm LEFT ATRIUM             Index        RIGHT ATRIUM           Index LA diam:        3.20 cm 2.50 cm/m   RA Area:     13.50 cm LA Vol (A2C):   35.3 ml 27.59 ml/m  RA Volume:   27.70 ml  21.65 ml/m LA Vol (A4C):   36.7 ml 28.69 ml/m LA Biplane Vol: 37.7 ml 29.47 ml/m  AORTIC VALVE AV Area (Vmax):    2.65 cm AV Area (Vmean):   2.49 cm AV Area (VTI):     2.58 cm AV Vmax:           178.00 cm/s AV Vmean:          116.000 cm/s AV VTI:            0.286 m AV Peak Grad:      12.7 mmHg AV Mean Grad:      7.0 mmHg LVOT Vmax:         124.00 cm/s LVOT Vmean:        76.000 cm/s LVOT VTI:          0.194 m LVOT/AV VTI ratio: 0.68  AORTA Ao Root diam: 3.10 cm Ao Asc diam:  3.50 cm MITRAL VALVE MV Area (PHT): 4.63 cm    SHUNTS MV Decel Time: 164 msec    Systemic VTI:  0.19 m MV E velocity: 72.80 cm/s  Systemic Diam: 2.20 cm MV A velocity: 95.90 cm/s MV E/A ratio:  0.76 Olga Millers MD Electronically signed by Olga Millers MD Signature Date/Time: 06/19/2023/11:48:10 AM    Final    CT Angio Chest PE W and/or Wo Contrast Result Date: 06/18/2023 CLINICAL DATA:  Pulmonary embolism suspected, high probability. Shortness of breath. EXAM: CT ANGIOGRAPHY CHEST WITH CONTRAST TECHNIQUE: Multidetector CT imaging of the chest was performed using the standard protocol during bolus administration of intravenous contrast. Multiplanar CT image reconstructions and MIPs were obtained to evaluate the vascular anatomy. RADIATION DOSE REDUCTION: This exam was performed according to the departmental dose-optimization program which includes automated exposure control, adjustment of the mA and/or kV according to patient size and/or use of iterative reconstruction technique. CONTRAST:  75mL OMNIPAQUE IOHEXOL 350 MG/ML SOLN COMPARISON:  09/11/2021, 04/13/2021. FINDINGS:  Cardiovascular: The heart is enlarged and there is no pericardial effusion. Multi-vessel coronary artery calcifications are noted. 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 The pulmonary trunk is mildly distended which may be associated with underlying pulmonary artery hypertension. No pulmonary embolism is seen. Evaluation of the pulmonary arteries at the lung bases is limited due to respiratory motion artifact. Mediastinum/Nodes: No enlarged mediastinal, hilar, or axillary lymph nodes. Thyroid gland, trachea, and esophagus demonstrate no significant findings. Lungs/Pleura: Scattered atelectasis or scarring is noted bilaterally. There are small bilateral pleural effusions with dependent atelectasis in the lower lobes. A nodular opacity is  noted in the right middle lobe measuring 1.1 cm, versus 1.0 cm on the previous exam. There is a 7 mm nodule in the left lower lobe, versus 6 mm on the previous exam, axial image 72. No new nodule is seen. No pneumothorax. Upper Abdomen: There is thickening of the left adrenal gland without evidence of discrete nodule. An exophytic hypodensity is noted in the mid right kidney, previously characterized as cyst. No acute abnormality. Musculoskeletal: Degenerative changes are present in the thoracic spine. No acute osseous abnormality. Review of the MIP images confirms the above findings. IMPRESSION: 1. No evidence of pulmonary embolism. 2. Scattered atelectasis or scarring bilaterally. 3. Small bilateral pleural effusions. 4. Scattered pulmonary nodules bilaterally measuring up to 1.1 cm, not significantly changed from 2022. Given stability over time, 1 year follow-up chest CT is recommended for high-risk patients. 5. Multi-vessel coronary artery calcifications. 6. 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 Recommend annual imaging followup by CTA or MRA. This  recommendation follows 2010 ACCF/AHA/AATS/ACR/ASA/SCA/SCAI/SIR/STS/SVM Guidelines for the Diagnosis and Management of Patients with Thoracic Aortic Disease. Circulation. 2010; 121: U440-H474. Aortic aneurysm NOS (ICD10-I71.9) Electronically Signed   By: Thornell Sartorius M.D.   On: 06/18/2023 22:25   DG Chest Portable 1 View Result Date: 06/18/2023 CLINICAL DATA:  Shortness of breath EXAM: PORTABLE CHEST 1 VIEW COMPARISON:  05/14/2015 FINDINGS: Heart and mediastinal contours within normal limits. Aortic atherosclerosis. Bilateral lower lobe airspace opacities. No visible effusions. No acute bony abnormality. IMPRESSION: Bilateral lower lobe airspace opacities could reflect atelectasis or pneumonia. Electronically Signed   By: Charlett Nose M.D.   On: 06/18/2023 17:15     Assessment and Plan:   [name] [name] is a 73 y.o. female with a hx of CAD, HTN, HLD, CKD IIIa, COPD, OA who is being seen 06/20/2023 for the evaluation of CHF at the request of Dr. Jacqulyn Bath.  Acute hypoxic respiratory failure -- presented with worsening dyspnea on exertion over the past couple of weeks. -- BNP 715, CT chest with small bilateral effusions -- s/p IV lasix, net - 3L, but developed hypotension today requiring NS bolus -- echo 3/18 with LVEF of 60-65%, g1DD, mildly reduced RV with interventricular septum flattening consistent with RV pressure and volume overload -- for now, hold additional IV lasix for now -- likely has OSA but has not been able to tolerate outpatient sleep study in the past. 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 50mg  TID historically but with  hx of CAD would favor stopping -- plan to continue metoprolol tomorrow if BP remains stable  CKD IIIa -- baseline Cr around 1.2-1.3, up to 1.58 today -- as above, hold additional lasix for now   Risk Assessment/Risk Scores:      New York Heart Association (NYHA) Functional Class NYHA Class II   For questions or updates, please contact [facility] Please consult [email or web address] for contact info under    Signed, Laverda Page, NP  06/20/2023 10:50 AM   ATTENDING ATTESTATION:  After conducting a review of all available clinical information with the care team, interviewing the patient, and performing a  physical exam, I agree with the findings and plan described in this note.   GEN: No acute distress.   HEENT:  Dry MM, no JVD, no scleral icterus Cardiac: RRR, no murmurs, rubs, or gallops.  Respiratory: Clear to auscultation bilaterally. GI: Soft, nontender, non-distended  MS: No edema; No deformity. 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  Patient tells me she has been fatigued for a long time.  She has been more fatigued and short of breath more recently but this is clearly a longstanding issue per her report.  She was admitted to the hospital and her troponins were mildly elevated.  Her BNP was 715.  She was diuresed.  Her creatinine has increased to 1.58.  She does not feel much improved.  When she saw Dr. Clifton James in the clinic he had thought about a cardiac catheterization but this was deferred due to the fact the patient lives by herself and had no one to take care of her after the procedure.  Given the patient's ongoing symptoms I think it makes sense to pursue a right heart catheterization today to evaluate her filling  pressures.  She is not really derive much benefit from diuresis which would suggest maybe a pulmonary etiology for her ongoing fatigue and shortness of breath.  While my suspicion for obstructive coronary artery disease is low I think it probably makes sense to do a diagnostic coronary angiography.  If she has high-grade obstructive disease of the proximal LAD or right coronary artery this could cause shortness of breath but I doubt it would cause fatigue which she has constantly.  While her creatinine is mildly elevated I think it makes sense to go ahead and pursue coronary angiography at the same time.  Will refer the patient for this procedure today.  Given her PVCs, will d/c flecainide given history of CAD.  Continue metoprolol.  I have reviewed the risks, indications, and alternatives to cardiac catheterization, possible angioplasty, and stenting with the patient. Risks include but are not limited to bleeding, infection, vascular injury, stroke, myocardial infection, arrhythmia, kidney injury, radiation-related injury in the case of prolonged fluoroscopy use, emergency cardiac surgery, and death. The patient understands the risks of serious complication is 1-2 in 1000 with diagnostic cardiac cath and 1-2% or less with angioplasty/stenting.    Alverda Skeans, MD Pager [phone]

## 2023-06-20 NOTE — Progress Notes (Signed)
 Heart Failure Stewardship Pharmacist Progress Note   PCP: [name], MD PCP-Cardiologist: Verne Carrow, MD    HPI:  73 y.o. female with PMHx of RA, GERD, allergic rhinitis, osteoarthritis, HLD, HTN, COPD, CAD, CKD (stage 3a), and depression/anxiety.  She presented to ED on 03/17 with complaints of worsening SOB. Patient reported she is typically SOB and has a non-productive cough at baseline due to COPD, but 3 days ago she noticed DOE with minimal activity. She also reported lower extremity swelling.  Initial vitals were 152/93, HR 84, RR 32. She was given 6 L O2 via [place] and later transitioned to BiPAP. BNP 715.9. She was given Lasix IV 40 mg prior to admission. CXR on 03/17 showed bilateral lower lobe airspace opacities could reflect atelectasis or pneumonia. Chest CT on  03/17 revealed no evidence of pulmonary embolism, scattered atelectasis or scarring bilaterally, small bilateral pleural effusions, scattered pulmonary nodules not significantly changed from 2022, multi-vessel coronary artery calcifications, aortic arthrosclerosis with aneurysmal dilation of the ascending aorta. She does follow cardiology for CAD. Her SO recently passed ~2 weeks ago. ECHO on 03/18 revealed LVEF of 60-65% (unchanged from 2020), LV has normal function, no regional wall abnormalities, LV G1 DD, signs of RV pressure and volume overload, MV is normal in structure, and right atrial pressure of 3 mmHg.   Today she states she is still feeling weak. Overall she is more of the same as yesterday. No dizziness or lightheadedness. Her BP medications are now being held. R/LHC was ordered for 03/19.   Current HF Medications: Diuretic: Furosemide 40 mg IV Q12H Beta Blocker: Metoprolol succinate 50 mg PO every day  ACE/ARB/ARNI: Lisinopril 10 mg every day  Other: Amlodipine 5 mg every day and KCl 10 mEq QD  Prior to admission HF Medications: Beta blocker: Metoprolol succinate 50 mg every day (last dispensed  05/2023 for 90 ds) ACE/ARB/ARNI: Lisinopril 10 mg every day (last dispensed 05/2023 for 90 ds) Other: Amlodipine 5 mg every day (last dispensed 06/2023 for 90 ds) and Imdur 30 mg every day (patient reported not taking; last dispensed 06/2023 for 90 ds)  Pertinent Lab Values: Serum creatinine 1.58(<1.25<1.08), BUN 36 (<23<20), Potassium 4.9 (<4.7<4.8), Sodium 139(<137<136), BNP 715.9, Magnesium 2 (03/18), A1c 5.8 (05/2023)  Vital Signs: Weight: 191.6 (admission weight: 190.9 lbs<- likely a guess) Blood pressure: 83/46-88/58 (84/42 @0806 )  Heart rate: 74-80 (80 @0806 )  I/O: net -0.95 L yesterday; net -2.2 L since admission  Medication Assistance / Insurance Benefits Check: Does the patient have prescription insurance?  Yes Type of insurance plan: UHC Medicare  Does the patient qualify for medication assistance through manufacturers or grants?   Yes  Outpatient Pharmacy:  Prior to admission outpatient pharmacy:  [id number] - [place],  - [address]   Is the patient willing to use [facility] TOC pharmacy at discharge? Yes Is the patient willing to transition their outpatient pharmacy to utilize a [facility] outpatient pharmacy?   No    Assessment: 1. Acute on chronic HFpEF (LVEF 60-65%). NYHA class III symptoms. -Strict I/Os and daily weights. Keep K>4, Mg>2 -Hold Furosemide 40 mg IV Q12H d/t possible euvolemia (cardiac cath will confirm) -Hold Metoprolol 50 mg QD - Stop Amlodipine 5 mg for concern for hypotension -Hold Lisinopril for AKI and hypotension  -Stop KCl   Plan: 1) Medication changes recommended at this time: -Transition to PO lasix PRN pending fluid status -Stop Amlodipine 5 mg for concern for hypotension  -Hold Lisinopril for AKI and  hypotension  -Hold Metoprolol 50 mg every day -Stop KCl  2) Patient assistance: -She is agreeable to using [facility] TOC pharmacy at discharge  3)  Education  - To be completed prior to discharge  Sofie Rower,  PharmD [employer] PGY-1

## 2023-06-20 NOTE — Plan of Care (Signed)
  Problem: Clinical Measurements: Goal: Ability to maintain clinical measurements within normal limits will improve Outcome: Progressing Goal: Diagnostic test results will improve Outcome: Progressing   

## 2023-06-20 NOTE — Progress Notes (Signed)
   06/20/23 1408  Vitals  BP (!) 63/54  MAP (mmHg) (!) 59  BP Location Left Arm  Pulse Rate 68  ECG Heart Rate 71  Resp (!) 30  MEWS COLOR  MEWS Score Color Red  Oxygen Therapy  SpO2 91 %  MEWS Score  MEWS Temp 0  MEWS Systolic 3  MEWS Pulse 0  MEWS RR 2  MEWS LOC 0  MEWS Score 5   RN obtained patient's informed consent for cath procedure when patient stated to nurse "Somethings different, I dont feel right". Patient unlabored but clammy skin. RN obtained VS, RN rechecked BP 52/28 (38). Rapid Response notified. Hospitalist notified. Cardiology notified. Verbal order from Rapid Response RN to initiate 500cc bolus. RN initiated bolus. Rapid response to round. Cardiology to round.

## 2023-06-20 NOTE — Consult Note (Signed)
 NAME:  [name] [name], MRN:  [id number], DOB:  1950/10/05, LOS: 1 ADMISSION DATE:  06/18/2023, CONSULTATION DATE:  06/20/23 REFERRING MD:  Jacqulyn Bath - [facility] CHIEF COMPLAINT:  06/20/23   History of Present Illness:   73 yo F PMH CAD, fq PVCs on flecainide, CKD 3a, COPD who came to ED 3/17 w SOB w associated hypoxia. CTA chest neg for PE, had small bilat pleural effusions and small nodules. BNP elevated 700s, admitted to [facility] for suspected heart failure tx w diuretics  ECHO 3/18 w g1 dd, mildly reduced RV fxn + RV flattening suggestive of overload  Cards consulted 3/19 and plans for [facility] 3/20  3/19 pt hypotensive throughout the day, rapid was called this morning.   PCCM consulted in this setting in afternoon   In d/w pt she reports SOB/ DOE is long standing, has told multiple outpt providers about this. BLE edema longstanding, but worse recently + shiny legs. Can't climb a flight of stairs.  Has been told she snores, has not ever been able to complete a sleep study; can't fall asleep. Used to sleep propped up on pillows but doesn't find it comfortable anymore   Pertinent  Medical History  CAD PVCs COPD CKD 3a HTN HLD FTT  Significant Hospital Events: Including procedures, antibiotic start and stop dates in addition to other pertinent events   3/17 admit to [facility] suspected HF, diuresed 3/18 ECHO w some RV dysfunction and grade 1 dd  3/19 cards consulted, plan for cath. Hypotensive subsequently PCCM consulted   Interim History / Subjective:  Has had about 1L UOP today  Hypotensive; has gotten about 500cc IVF   Objective   Blood pressure 93/63, pulse 77, temperature 97.8 F (36.6 C), temperature source Oral, resp. rate 15, height 5' (1.524 m), weight 86.9 kg, SpO2 93%.        Intake/Output Summary (Last 24 hours) at 06/20/2023 1549 Last data filed at 06/20/2023 1529 Gross per 24 hour  Intake 927.68 ml  Output 1400 ml  Net -472.32 ml   Filed Weights   06/18/23 1556 06/20/23  0500  Weight: 86.6 kg 86.9 kg    Examination: General: chronically ill elderly F NAD  HENT: NCAT pink mm anicteric sclera Lungs: CTAb. Symmetrical chest expansion, even and unlabored.  Cardiovascular: some pvs on monitor. s1s2. Cap refill < 3 sec  Abdomen: soft ndnt  Extremities: BLE pitting edema  Neuro: AAOx4  GU: defer  Resolved Hospital Problem list     Assessment & Plan:   Acute hypoxic resp failure DOE Hx COPD Suspected OSA Suspected pHTN  HFpEF Hx nonobstructive CAD  Fq PVCs  AKI Urinary retention DNR/I -CTA chest neg for PE -exam not c/w AECOPD  -BNP was up, ECHO w normal EF, g1dd, slightly reduced RV fxn and findings c/f RV overload. -hypotensive 3/19-- rcvd about 500cc IVF and has had improvement -LA is normal, suggesting against shock state  P -at this time does not need ICU transfer, At this time, BP has improved, LA is normal -- please let us know if her clinical status changes, we are happy to re-eval; it looks like her BP has been quite variable today  -MAP goal > 65   -if soft again, could consider midodrine v albumin; and of course if needing ICU txf could start periph pressors -since she isnt going for cath, think it would be ok to eat and be NPO at [place] for cath tomorrow, but defer to primary  -Agree w holding antihypertensives,  further diuretics for now  -plan is for [facility] tomorrow -- think this will be really helpful in helping to figure out what is driving.  -follow renal fxn, UOP. I/O per protocol    Best Practice (right click and "Reselect all SmartList Selections" daily)   Diet/type: NPO for cath  DVT prophylaxis LMWH Pressure ulcer(s): pressure ulcer assessment deferred  GI prophylaxis: N/A Lines: N/A Foley:  N/A Code Status:  DNR Last date of multidisciplinary goals of care discussion [3/19]  Labs   CBC: Recent Labs  Lab 06/18/23 1648 06/19/23 0500  WBC 7.2 7.8  NEUTROABS 5.3  --   HGB 14.0 13.9  HCT 46.0 44.8  MCV 98.7 97.6   PLT 304 326    Basic Metabolic Panel: Recent Labs  Lab 06/18/23 1648 06/19/23 0500 06/20/23 0246  NA 136 137 139  K 4.8 4.7 4.9  CL 97* 100 97*  CO2 27 29 35*  GLUCOSE 111* 100* 108*  BUN 20 23 36*  CREATININE 1.08* 1.25* 1.58*  CALCIUM 8.8* 8.6* 8.7*  MG  --  2.0  --    GFR: Estimated Creatinine Clearance: 30.6 mL/min (A) (by C-G formula based on SCr of 1.58 mg/dL (H)). Recent Labs  Lab 06/18/23 1648 06/19/23 0500 06/20/23 0844  WBC 7.2 7.8  --   LATICACIDVEN  --   --  1.5    Liver Function Tests: Recent Labs  Lab 06/18/23 1648  AST 20  ALT 16  ALKPHOS 54  BILITOT 0.6  PROT 6.8  ALBUMIN 3.5   No results for input(s): "LIPASE", "AMYLASE" in the last 168 hours. No results for input(s): "AMMONIA" in the last 168 hours.  ABG    Component Value Date/Time   PHART 7.352 04/22/2014 0908   PCO2ART 42.5 04/22/2014 0908   PO2ART 122.0 (H) 04/22/2014 0908   HCO3 23.6 04/22/2014 0908   TCO2 25 04/22/2014 0908   ACIDBASEDEF 2.0 04/22/2014 0908   O2SAT 99.0 04/22/2014 0908     Coagulation Profile: No results for input(s): "INR", "PROTIME" in the last 168 hours.  Cardiac Enzymes: No results for input(s): "CKTOTAL", "CKMB", "CKMBINDEX", "TROPONINI" in the last 168 hours.  HbA1C: Hgb A1c MFr Bld  Date/Time Value Ref Range Status  05/21/2023 02:08 PM 5.8 4.6 - 6.5 % Final    Comment:    Glycemic Control Guidelines for People with Diabetes:Non Diabetic:  <6%Goal of Therapy: <7%Additional Action Suggested:  >8%   03/22/2021 01:47 PM 5.9 4.6 - 6.5 % Final    Comment:    Glycemic Control Guidelines for People with Diabetes:Non Diabetic:  <6%Goal of Therapy: <7%Additional Action Suggested:  >8%     CBG: No results for input(s): "GLUCAP" in the last 168 hours.  Review of Systems:   Review of Systems  Constitutional: Negative.   HENT: Negative.    Eyes: Negative.   Respiratory:  Positive for shortness of breath. Negative for cough, hemoptysis, sputum  production and wheezing.   Cardiovascular:  Positive for chest pain, orthopnea and leg swelling.  Gastrointestinal:  Positive for heartburn.  Genitourinary: Negative.   Skin: Negative.   Neurological: Negative.   Psychiatric/Behavioral:  The patient has insomnia.     Past Medical History:  She,  has a past medical history of Allergic rhinitis, ALLERGIC RHINITIS (05/08/2007), Allergy, Anxiety, Anxiety state (05/25/2014), Aortic valve disease, CAD (coronary artery disease), Cataract, Chronic low back pain (03/09/2016), Complication of anesthesia (2003), COPD (chronic obstructive pulmonary disease) (HCC), COPD (chronic obstructive pulmonary disease) with chronic bronchitis (  HCC) (10/12/2014), Depression, Depression, major, single episode, moderate (HCC) (05/08/2007), Dexamethasone adverse reaction, Dilated aortic root (HCC), Duodenal mass, GERD (05/08/2007), Hyperlipidemia, Hypertension, Insomnia, Neck pain, chronic (03/09/2016), NICOTINE ADDICTION (05/08/2007), Osteoarthritis, Overactive bladder, Pneumonia, Premature ventricular contractions, Thoracic back pain (05/30/2014), Tobacco abuse, and Ulcer.   Surgical History:   Past Surgical History:  Procedure Laterality Date   BREAST BIOPSY     CARDIAC CATHETERIZATION N/A 12/03/2015   Procedure: Left Heart Cath and Coronary Angiography;  Surgeon: Kathleene Hazel, MD;  Location: [facility] INVASIVE CV LAB;  Service: Cardiovascular;  Laterality: N/A;   CARPAL TUNNEL RELEASE Left    1997 left,     CHOLECYSTECTOMY N/A 03/23/2015   Procedure: LAPAROSCOPIC CHOLECYSTECTOMY;  Surgeon: Axel Filler, MD;  Location: [facility] OR;  Service: General;  Laterality: N/A;   COLONOSCOPY  10/17/2013   per Dr. Juanda Chance, benign polyps, repeat in 10 years   EUS N/A 11/12/2014   Procedure: UPPER ENDOSCOPIC ULTRASOUND (EUS) LINEAR;  Surgeon: Rachael Fee, MD;  Location: [facility] ENDOSCOPY;  Service: Endoscopy;  Laterality: N/A;   EUS N/A 06/08/2016   Procedure: UPPER ENDOSCOPIC ULTRASOUND  (EUS) RADIAL;  Surgeon: Rachael Fee, MD;  Location: [facility] ENDOSCOPY;  Service: Endoscopy;  Laterality: N/A;   EYE SURGERY     LEFT AND RIGHT HEART CATHETERIZATION WITH CORONARY ANGIOGRAM N/A 04/22/2014   Procedure: LEFT AND RIGHT HEART CATHETERIZATION WITH CORONARY ANGIOGRAM;  Surgeon: Kathleene Hazel, MD;  [facility] CATH LAB;  Service: Cardiovascular;  Laterality: N/A;   TONSILLECTOMY  1950   torn cartilage repaired rt wrist Right 2003     Social History:   reports that she quit smoking about 6 years ago. Her smoking use included cigarettes. She started smoking about 60 years ago. She has a 24.3 pack-year smoking history. She has never used smokeless tobacco. She reports that she does not drink alcohol and does not use drugs.   Family History:  Her family history includes CAD in her brother; Emphysema in her mother; Heart disease in an other family member; Hyperlipidemia in an other family member; Hypertension in an other family member; Kidney disease in her brother and another family member; Other in her father; Throat cancer in her brother. There is no history of Colon cancer, Esophageal cancer, Stomach cancer, or Rectal cancer.   Allergies Allergies  Allergen Reactions   Augmentin [Amoxicillin-Pot Clavulanate] Nausea And Vomiting   Clindamycin/Lincomycin Nausea And Vomiting   Colchicine Diarrhea   Flecainide Other (See Comments)    Conjunctivitis    Hydroxychloroquine Rash     Home Medications  Prior to Admission medications   Medication Sig Start Date End Date Taking? Authorizing Provider  albuterol (PROAIR HFA) 108 (90 Base) MCG/ACT inhaler Inhale 2 puffs into the lungs every 6 (six) hours as needed for wheezing. 05/14/23  Yes [name], MD  ALPRAZolam Prudy Feeler) 0.25 MG tablet TAKE 3 TABLETS BY MOUTH EVERY 6 HOURS AS NEEDED 09/28/22  Yes [name], MD  amLODipine (NORVASC) 5 MG tablet Take 1 tablet (5 mg total) by mouth daily. 06/05/23  Yes [name], MD   ARIPiprazole (ABILIFY) 2 MG tablet Take 2 mg by mouth daily. 06/10/23  Yes [provider]  aspirin EC 81 MG tablet Take 1 tablet (81 mg total) by mouth daily. 01/18/15  Yes Kathleene Hazel, MD  cetirizine (ZYRTEC) 10 MG tablet Take 10 mg by mouth daily as needed for allergies.    Yes [provider]  cholecalciferol (VITAMIN D3) 25  MCG (1000 UNIT) tablet Take 1,000 Units by mouth daily.   Yes [provider]  flecainide (TAMBOCOR) 50 MG tablet TAKE 1 TABLET BY MOUTH THREE TIMES A DAY 06/05/23  Yes Kathleene Hazel, MD  lisinopril (ZESTRIL) 10 MG tablet Take 10 mg by mouth daily. 07/11/19  Yes [provider]  magnesium oxide (MAG-OX) 400 (240 Mg) MG tablet TAKE 1 TABLET BY MOUTH EVERY DAY 06/05/23  Yes Kathleene Hazel, MD  metoprolol succinate (TOPROL-XL) 50 MG 24 hr tablet TAKE 1 TABLET BY MOUTH DAILY. TAKE WITH OR IMMEDIATELY FOLLOWING A MEAL. 05/18/23  Yes [name], MD  nitroGLYCERIN (NITROSTAT) 0.4 MG SL tablet Place 1 tablet (0.4 mg total) under the tongue every 5 (five) minutes as needed for chest pain. 06/04/23  Yes Kathleene Hazel, MD  temazepam (RESTORIL) 15 MG capsule Take 1 capsule (15 mg total) by mouth at bedtime as needed for sleep. 05/21/23  Yes [name], MD  isosorbide mononitrate (IMDUR) 30 MG 24 hr tablet Take 1 tablet (30 mg total) by mouth daily. Patient not taking: Reported on 06/18/2023 06/04/23   Kathleene Hazel, MD     Critical care time: na      High MDM  Tessie Fass MSN, AGACNP-BC [facility] Pulmonary/Critical Care Medicine Amion for pager  06/20/2023, 3:49 PM

## 2023-06-20 NOTE — Progress Notes (Signed)
   Called by RN with patient dropping BP in he 60s systolic. Rapid at the bedside, manual BP 82/54. Patient reports feeling light-headed, dizzy and headache. Primary MD paged by RN. As noted, initially planned for [facility] today. Suspect hypotension may be related to hypovolemia. IV lasix and BP meds already held after seen in consult. Receiving NS bolus 500cc with 89/54, MAP of 66 on recheck. Color improved. Defer R/LHC until tomorrow. Patient updated on plan.   Janice Coffin, NP-C 06/20/2023, 3:23 PM Pager: [phone]

## 2023-06-21 DIAGNOSIS — J9601 Acute respiratory failure with hypoxia: Secondary | ICD-10-CM | POA: Diagnosis not present

## 2023-06-21 LAB — BASIC METABOLIC PANEL
Anion gap: 9 (ref 5–15)
BUN: 47 mg/dL — ABNORMAL HIGH (ref 8–23)
CO2: 32 mmol/L (ref 22–32)
Calcium: 8.6 mg/dL — ABNORMAL LOW (ref 8.9–10.3)
Chloride: 97 mmol/L — ABNORMAL LOW (ref 98–111)
Creatinine, Ser: 1.72 mg/dL — ABNORMAL HIGH (ref 0.44–1.00)
GFR, Estimated: 31 mL/min — ABNORMAL LOW (ref >60)
Glucose, Bld: 105 mg/dL — ABNORMAL HIGH (ref 70–99)
Potassium: 4.4 mmol/L (ref 3.5–5.1)
Sodium: 138 mmol/L (ref 135–145)

## 2023-06-21 MED ORDER — SODIUM CHLORIDE 0.9 % IV SOLN: INTRAVENOUS | Status: DC

## 2023-06-21 MED ORDER — CHLORHEXIDINE GLUCONATE CLOTH 2 % EX PADS
6.0000 | MEDICATED_PAD | Freq: Every day | CUTANEOUS | Status: DC
  Administered 2023-06-21 – 2023-07-05 (×12): 6 via TOPICAL

## 2023-06-21 MED ORDER — CHLORHEXIDINE GLUCONATE CLOTH 2 % EX PADS: 6.0000 | MEDICATED_PAD | Freq: Every day | CUTANEOUS | Status: DC

## 2023-06-21 NOTE — Evaluation (Signed)
 Physical Therapy Evaluation Patient Details Name: [name] [name] MRN: [id number] DOB: [birth year] Today's Date: 06/21/2023  History of Present Illness  Pt is a 73 y.o. F admitted 06/18/23 with SOB and associated hypoxia. CTA chest neg for PE, had small bilat pleural effusions and small nodules. Pt was diuresed, found to be hypotensive, and received 500 cc of fluid. 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   Clinical Impression  Pt admitted with above diagnosis. PTA, pt was modI with functional mobility and ADLs without an AD, but required increased time d/t DOE. She resides in a two story townhouse with a roommate that has 12 steps with LHR to reach the bedroom and bathroom. Examination limited by symptomatic orthostatic hypotension. Pt currently with functional limitations due to the deficits listed below (see PT Problem List). She is below baseline functioning requiring 4L of supplemental O2 to maintain SpO2 >88% with activity. Pt will benefit from acute skilled PT to increase her independence and safety with mobility to allow discharge. Given her significant fall history (26 in the past year), high risk for re-injury and re-hospitalization, and lack of support available at home recommend post-acute rehab <3 hours/day therapy to increase pt's strength, decrease fall risk, and improve activity tolerance.    If plan is discharge home, recommend the following: A little help with walking and/or transfers;A little help with bathing/dressing/bathroom;Assistance with cooking/housework;Assist for transportation;Help with stairs or ramp for entrance   Can travel by private vehicle   Yes    Equipment Recommendations Rollator (4 wheels)  Recommendations for Other Services       Functional Status Assessment Patient has had a recent decline in their functional status and demonstrates the ability to make significant improvements in function in a reasonable and predictable amount of  time.     Precautions / Restrictions Precautions Precautions: Fall Recall of Precautions/Restrictions: Intact Restrictions Weight Bearing Restrictions Per Provider Order: No      Mobility  Bed Mobility Overal bed mobility: Needs Assistance Bed Mobility: Supine to Sit     Supine to sit: HOB elevated, Used rails, Min assist     General bed mobility comments: Pt required extra time to achieve upright posture, minA to bring BLE off EOB and minA at trunk to reach upright. She scooted fwd til feet support with CGA and increased time.    Transfers Overall transfer level: Needs assistance Equipment used: None Transfers: Sit to/from Stand, Bed to chair/wheelchair/BSC Sit to Stand: Contact guard assist   Step pivot transfers: Contact guard assist       General transfer comment: Pt stood from lowest bed height with CGA to power up. She required VC for sequencing to transfer to the recliner chair on her right. Good eccentric control with sitting.    Ambulation/Gait               General Gait Details: Deferred d/t symptomatic orthostatic hypotension.  Stairs            Wheelchair Mobility     Tilt Bed    Modified Rankin (Stroke Patients Only)       Balance Overall balance assessment: Needs assistance Sitting-balance support: No upper extremity supported, Feet supported Sitting balance-Leahy Scale: Fair Sitting balance - Comments: Pt sat EOB with intermittent CGA at trunk.   Standing balance support: During functional activity, Single extremity supported Standing balance-Leahy Scale: Fair Standing balance comment: Pt stood and transferred without an AD and CGA for safety.  Pertinent Vitals/Pain Pain Assessment Pain Assessment: No/denies pain ("I am just very weak.")    Home Living Family/patient expects to be discharged to:: Private residence Living Arrangements: Non-relatives/Friends (Roomate) Available Help at  Discharge: Other (Comment) (Pt reports her partner died in May 30, 2023. She states she doesn't have anyone that can help her out.) Type of Home: House (Townhouse) Home Access: Level entry     Alternate Level Stairs-Number of Steps: 12 Home Layout: Two level Home Equipment: None      Prior Function Prior Level of Function : Independent/Modified Independent             Mobility Comments: Indep without AD. Has difficulty climbing stairs. Pt reports 26 falls in the past year, and states that her and the doctor's don't know the cause. ADLs Comments: Indep but very slow to perfrom ADLs d/t DOE. Manages her own medications, finances, and houseold. Reports she recently stopped driving and relies on transportation through the bus system.     Extremity/Trunk Assessment   Upper Extremity Assessment Upper Extremity Assessment: Defer to OT evaluation    Lower Extremity Assessment Lower Extremity Assessment: Generalized weakness (Decreased light touch sensation and gross motor coordination (L>R))    Cervical / Trunk Assessment Cervical / Trunk Assessment: Kyphotic  Communication   Communication Communication: No apparent difficulties    Cognition Arousal: Alert Behavior During Therapy: WFL for tasks assessed/performed   PT - Cognitive impairments: No family/caregiver present to determine baseline, Orientation, Initiation, Sequencing   Orientation impairments: Time (Pt unable to state the date or day of the week.)                   PT - Cognition Comments: Pt A,Ox3 with delayed processing time. Following commands: Impaired Following commands impaired: Follows one step commands with increased time, Follows multi-step commands with increased time     Cueing Cueing Techniques: Verbal cues, Gestural cues     General Comments General comments (skin integrity, edema, etc.): Pt greeted on 4L O2. Attempted to titrated to RA, but desaturated to 78%. Reapplied 4L to maintain SpO2  >88%. BP: supine 88/71 (76), sitting EOB 88/72 (79), sitting following transfer 109/48 (68). Pt c/o dizziness with position changes which was able to lessen with time.    Exercises     Assessment/Plan    PT Assessment Patient needs continued PT services  PT Problem List Decreased strength;Decreased activity tolerance;Decreased balance;Decreased coordination;Decreased mobility;Decreased safety awareness;Cardiopulmonary status limiting activity       PT Treatment Interventions DME instruction;Gait training;Stair training;Functional mobility training;Therapeutic activities;Therapeutic exercise;Balance training;Neuromuscular re-education;Patient/family education    PT Goals (Current goals can be found in the Care Plan section)  Acute Rehab PT Goals Patient Stated Goal: Return Home and care for myself PT Goal Formulation: With patient Time For Goal Achievement: 07/05/23 Potential to Achieve Goals: Good    Frequency Min 2X/week     Co-evaluation               AM-PAC PT "6 Clicks" Mobility  Outcome Measure Help needed turning from your back to your side while in a flat bed without using bedrails?: A Little Help needed moving from lying on your back to sitting on the side of a flat bed without using bedrails?: A Little Help needed moving to and from a bed to a chair (including a wheelchair)?: A Little Help needed standing up from a chair using your arms (e.g., wheelchair or bedside chair)?: A Little Help needed to walk in hospital room?: A  Lot Help needed climbing 3-5 steps with a railing? : A Lot 6 Click Score: 16    End of Session Equipment Utilized During Treatment: Gait belt;Oxygen Activity Tolerance: Treatment limited secondary to medical complications (Comment) (symptomatic orthostatic hypotension) Patient left: in chair;with call bell/phone within reach;with chair alarm set Nurse Communication: Mobility status;Other (comment) (Pt's vital sign response during session  including desaturation and symptomatic orthostatic hypotension) PT Visit Diagnosis: Muscle weakness (generalized) (M62.81);History of falling (Z91.81);Repeated falls (R29.6);Unsteadiness on feet (R26.81);Difficulty in walking, not elsewhere classified (R26.2);Other abnormalities of gait and mobility (R26.89)    Time: 4098-1191 PT Time Calculation (min) (ACUTE ONLY): 40 min   Charges:   PT Evaluation $PT Eval Moderate Complexity: 1 Mod PT Treatments $Therapeutic Activity: 8-22 mins PT General Charges $$ ACUTE PT VISIT: 1 Visit         Cheri Guppy, PT, DPT Acute Rehabilitation Services Office: [phone] Secure Chat Preferred  [name] 06/21/2023, 11:14 AM

## 2023-06-21 NOTE — Progress Notes (Signed)
 [facility] night cross cover note:   I was notified by RN that the patient has the urge to urinate but unable to void.  Bladder scan reveals 325 cc.  I subsequently ordered straight cath x 1 now.     Newton Pigg, DO Hospitalist

## 2023-06-21 NOTE — Progress Notes (Addendum)
 Patient Name: [name] [name] Date of Encounter: 06/21/2023 [facility] Cardiologist: Verne Carrow, MD   Interval Summary  .    Sitting up in the chair, remains short of breath on 4L [place]. No chest pain. Having issues with urinary retention   Vital Signs .    Vitals:   06/21/23 0400 06/21/23 0600 06/21/23 0735 06/21/23 0745  BP: (!) 90/51 (!) 102/58 (!) 102/57   Pulse: 73 75 78   Resp: 17 15 18    Temp: 99 F (37.2 C)  97.9 F (36.6 C) 97.9 F (36.6 C)  TempSrc: Oral  Oral Oral  SpO2: 95% 97% 98%   Weight: 86.7 kg     Height:        Intake/Output Summary (Last 24 hours) at 06/21/2023 0852 Last data filed at 06/21/2023 0735 Gross per 24 hour  Intake 1017.68 ml  Output 1250 ml  Net -232.32 ml      06/21/2023    4:00 AM 06/20/2023    5:00 AM 06/18/2023    3:56 PM  Last 3 Weights  Weight (lbs) 191 lb 2.2 oz 191 lb 9.3 oz 191 lb  Weight (kg) 86.7 kg 86.9 kg 86.637 kg      Telemetry/ECG    Sinus Rhythm, few PVCs - Personally Reviewed  Physical Exam .    GEN: Mildly dyspneic at rest on 4L [place]: No JVD Cardiac: RRR, no murmurs, rubs, or gallops.  Respiratory: Clear to auscultation bilaterally. GI: Soft, nontender, non-distended  MS: No edema  Assessment & Plan .     [name] [name] is a 73 y.o. female with a hx of CAD, HTN, HLD, CKD IIIa, COPD, OA who is being seen 06/20/2023 for the evaluation of CHF at the request of Dr. Jacqulyn Bath.   Acute hypoxic respiratory failure Suspected OSA Possible pulmonary HTN COPD -- presented with worsening dyspnea on exertion over the past couple of weeks. BNP 715, CT chest with small bilateral effusions -- s/p IV lasix, net - 2.4L, but developed hypotension yesterday requiring NS bolus -- echo 3/18 with LVEF of 60-65%, g1DD, mildly reduced RV with interventricular septum flattening consistent with RV pressure and volume overload -- for now, holding IV lasix. Continue IVFs as suspect she is hypovolemic  --  will rise in Cr, will plan for [facility] tomorrow pending renal function    Nonobstructive CAD -- cardiac cath 2017 with 50% mid LAD stenosis, mild disease of RCA and circumflex with recommendations for medical management  -- denies any chest pain PTA -- echo showed LVEF of 60-65%, no rWMA -- hsTn 97>>104, EKG without ischemic changes  -- as above, planned for [facility] tomorrow    PVCs -- has been on flecainide 50mg  TID historically but with hx of CAD would favor stopping -- will plan to resume metoprolol pending stable BP    CKD IIIa with AKI -- baseline Cr around 1.2-1.3, up to 1.58>> 1.7 today -- as above, holding lasix and BP meds -- continue IVFs    For questions or updates, please contact [facility] Please consult [email or web address] for contact info under     Signed, Laverda Page, NP   ATTENDING ATTESTATION:  After conducting a review of all available clinical information with the care team, interviewing the patient, and performing a physical exam, I agree with the findings and plan described in this note.   GEN: No acute distress.   HEENT:  MMM, no JVD, no scleral icterus Cardiac: RRR, no murmurs,  rubs, or gallops.  Respiratory: Clear to auscultation bilaterally. GI: Soft, nontender, non-distended  MS: No edema; No deformity. Neuro:  Nonfocal  Vasc:  +2 radial pulses  Patient relatively unchanged today.  Feels fatigued and somewhat dyspneic but is non-specific about it.  I believed she was euvolemic if not dry yesterday and her drop in pressure yesterday would suggest that she is volume depleted.  Cr mildly elevated today.  Will follow Cr and plan on cor angio and RHC tomorrow.  Only if high grade pLAD or high grade pRCA (of large dominant RCA), should PCI be pursued I think.  Other continue to hold diuretics for now and cont judicious IVF.  Alverda Skeans, MD Pager [phone]

## 2023-06-21 NOTE — Progress Notes (Signed)
   06/21/23 1304  Spiritual Encounters  Type of Visit Initial  Care provided to: Patient  Reason for visit Advance directives   Chaplain responding to spiritual consult stating Pt needed ACD documents notarized.  Upon arrival in the room, Pt states that the paperwork was already signed and taken care of.  Chaplain services remain available by Spiritual Consult or for emergent cases, paging [phone]  Chaplain Raelene Bott, MDiv [email or web address] [phone]

## 2023-06-21 NOTE — Progress Notes (Signed)
 Heart Failure Stewardship Pharmacist Progress Note   PCP: [name], MD PCP-Cardiologist: Verne Carrow, MD    HPI:  73 y.o. female with PMHx of RA, GERD, allergic rhinitis, osteoarthritis, HLD, HTN, COPD, CAD, CKD (stage 3a), and depression/anxiety.  She presented to ED on 03/17 with complaints of worsening SOB. Patient reported she is typically SOB and has a non-productive cough at baseline due to COPD, but 3 days ago she noticed DOE with minimal activity. She also reported lower extremity swelling.  Initial vitals were 152/93, HR 84, RR 32. She was given 6 L O2 via [place] and later transitioned to BiPAP. BNP 715.9. She was given Lasix IV 40 mg prior to admission. CXR on 03/17 showed bilateral lower lobe airspace opacities could reflect atelectasis or pneumonia. Chest CT on  03/17 revealed no evidence of pulmonary embolism, scattered atelectasis or scarring bilaterally, small bilateral pleural effusions, scattered pulmonary nodules not significantly changed from 2022, multi-vessel coronary artery calcifications, aortic arthrosclerosis with aneurysmal dilation of the ascending aorta. She does follow cardiology for CAD. Her SO recently passed ~2 weeks ago. ECHO on 03/18 revealed LVEF of 60-65% (unchanged from 2020), LV has normal function, no regional wall abnormalities, LV G1 DD, signs of RV pressure and volume overload, MV is normal in structure, and right atrial pressure of 3 mmHg.   Today she states she feels more of the same as yesterday. She is still struggling with urinary retention. She also noted she has not had a bowel movement in at least 4 days (possibly a week). She was last given senna-docusate on 03/18. R/LHC scheduled for 03/21.   Current HF Medications: Diuretic: Furosemide 40 mg IV Q12H (holding for SCr/hypotension) Beta Blocker: Metoprolol succinate 50 mg PO every day (hypotension) ACE/ARB/ARNI: Lisinopril 10 mg every day (holding for SCr/hypotension) Other:  Amlodipine 5 mg every day (holding for hypotension)  Prior to admission HF Medications: Beta blocker: Metoprolol succinate 50 mg every day (last dispensed 05/2023 for 90 ds) ACE/ARB/ARNI: Lisinopril 10 mg every day (last dispensed 05/2023 for 90 ds) Other: Amlodipine 5 mg every day (last dispensed 06/2023 for 90 ds) and Imdur 30 mg every day (patient reported not taking; last dispensed 06/2023 for 90 ds)  Pertinent Lab Values: Serum creatinine 1.72(<1.58<1.25<1.08), BUN 47(<36<23<20), Potassium 4.4(<4.9<4.7<4.8), Sodium 138(<139<137<136), BNP 715.9, Magnesium 2 (03/18), A1c 5.8 (05/2023)  Vital Signs: Weight: 191.1 (admission weight: 190.9 lbs<- likely a guess) Blood pressure: 88/72-109/48 (109/48 @0910 )  Heart rate: 78-81 (81 @0910 )  I/O: net -0.82 L yesterday; net -2.4 L since admission  Medication Assistance / Insurance Benefits Check: Does the patient have prescription insurance?  Yes Type of insurance plan: UHC Medicare  Does the patient qualify for medication assistance through manufacturers or grants?   Yes  Outpatient Pharmacy:  Prior to admission outpatient pharmacy:  [id number] - [place], [place] - [address]   Is the patient willing to use [facility] TOC pharmacy at discharge? Yes Is the patient willing to transition their outpatient pharmacy to utilize a [facility] outpatient pharmacy?   No    Assessment: 1. Acute on chronic HFpEF (LVEF 60-65%). NYHA class III symptoms. -Strict I/Os and daily weights. Keep K>4, Mg>2 -Continue to hold Furosemide and GDMT for hypotension    Plan: 1) Medication changes recommended at this time: -None at this time. Will reconsider starting GDMT post cardiac cath on 03/21  2) Patient assistance: -She is agreeable to using [facility] TOC pharmacy at discharge  3)  Education  - To  be completed prior to discharge  Sofie Rower, PharmD [facility] PGY-1

## 2023-06-21 NOTE — Progress Notes (Signed)
   06/21/23 0059  Urine Characteristics  Urinary Interventions Bladder scan  Bladder Scan Volume (mL) 352 mL   Pt unable to void on her own, Dr. Arlean Hopping made aware, order for In and out cath obtained. 450cc out.

## 2023-06-21 NOTE — Evaluation (Signed)
 Occupational Therapy Evaluation Patient Details Name: [name] [name] MRN: [id number] DOB: 09/24/1950 Today's Date: 06/21/2023   History of Present Illness   Pt is a 73 y.o. F admitted 06/18/23 with SOB and associated hypoxia. CTA chest neg for PE, had small bilat pleural effusions and small nodules. Pt was diuresed, found to be hypotensive, and received 500 cc of fluid. 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     Clinical Impressions Pt lives with a female roommate and was functioning without AD or DME in her home. She does not drive. She endorses many falls without reason. She admits to difficulty showering in standing, performing LB bathing and dressing and completing housework due to shortness of breath and fatigue. She reports climbing stairs to her bedroom requires increased time and multiple rest breaks. Limited session, due to low BP, to standing transfers with pt requiring up to hand held assist. Pt is dependent on 4L O2 to maintain SpO2 >90%.  Patient will benefit from continued inpatient follow up therapy, <3 hours/day. Sent secure chat to CM and SW for information regarding medicaid eligibility at pt's request. Provided pt with [facility] brochure for possible home modifications.      If plan is discharge home, recommend the following:   A little help with walking and/or transfers;A little help with bathing/dressing/bathroom;Assistance with cooking/housework;Assist for transportation;Help with stairs or ramp for entrance     Functional Status Assessment   Patient has had a recent decline in their functional status and demonstrates the ability to make significant improvements in function in a reasonable and predictable amount of time.     Equipment Recommendations    (recommended tub bench, pt unable to afford items not covered by insurance)     Recommendations for Other Services         Precautions/Restrictions   Precautions Precautions:  Fall Recall of Precautions/Restrictions: Intact Restrictions Weight Bearing Restrictions Per Provider Order: No     Mobility Bed Mobility               General bed mobility comments: received and returned to chair    Transfers Overall transfer level: Needs assistance Equipment used: 1 person hand held assist Transfers: Sit to/from Stand Sit to Stand: Contact guard assist           General transfer comment: stood with CGA, reached for OT's hand upon standing      Balance Overall balance assessment: Needs assistance   Sitting balance-Leahy Scale: Fair Sitting balance - Comments: can withstand minimal challenge     Standing balance-Leahy Scale: Fair Standing balance comment: fair to poor                           ADL either performed or assessed with clinical judgement   ADL Overall ADL's : Needs assistance/impaired Eating/Feeding: Independent;Sitting   Grooming: Set up;Sitting   Upper Body Bathing: Minimal assistance;Sitting   Lower Body Bathing: Moderate assistance;Sit to/from stand   Upper Body Dressing : Set up;Sitting   Lower Body Dressing: Moderate assistance;Sit to/from stand                       Vision Baseline Vision/History: 1 Wears glasses Ability to See in Adequate Light: 0 Adequate Patient Visual Report: No change from baseline       Perception         Praxis  Pertinent Vitals/Pain Pain Assessment Pain Assessment: No/denies pain     Extremity/Trunk Assessment     Lower Extremity Assessment Lower Extremity Assessment: Overall WFL for tasks assessed   Cervical / Trunk Assessment Cervical / Trunk Assessment: Kyphotic   Communication Communication Communication: No apparent difficulties   Cognition Arousal: Alert Behavior During Therapy: WFL for tasks assessed/performed Cognition: History of cognitive impairments                               Following commands:  Impaired Following commands impaired: Follows one step commands with increased time, Follows multi-step commands with increased time     Cueing  General Comments   Cueing Techniques: Verbal cues;Gestural cues      Exercises     Shoulder Instructions      Home Living Family/patient expects to be discharged to:: Private residence Living Arrangements: Non-relatives/Friends (female roommate) Available Help at Discharge: Other (Comment) (none) Type of Home: Other(Comment) (townhome) Home Access: Level entry     Home Layout: Two level Alternate Level Stairs-Number of Steps: 12 Alternate Level Stairs-Rails: Left Bathroom Shower/Tub: Chief Strategy Officer: Standard     Home Equipment: None          Prior Functioning/Environment Prior Level of Function : Independent/Modified Independent             Mobility Comments: Indep without AD. Has difficulty climbing stairs. Pt reports 26 falls in the past year, and states that her and the doctor's don't know the cause. ADLs Comments: Indep but very slow to perfrom ADLs d/t DOE. Manages her own medications, finances, and houseold. Reports she recently stopped driving and relies on transportation through the bus system, unable to perform housekeeping due to fatigue    OT Problem List: Obesity;Decreased activity tolerance;Cardiopulmonary status limiting activity;Decreased knowledge of use of DME or AE;Decreased knowledge of precautions   OT Treatment/Interventions: Self-care/ADL training;DME and/or AE instruction;Therapeutic activities;Patient/family education;Balance training;Energy conservation      OT Goals(Current goals can be found in the care plan section)   Acute Rehab OT Goals OT Goal Formulation: With patient Time For Goal Achievement: 07/05/23 Potential to Achieve Goals: Good ADL Goals Pt Will Perform Grooming: with supervision;standing Pt Will Perform Lower Body Bathing: with supervision;sit to/from  stand;with adaptive equipment Pt Will Perform Lower Body Dressing: with supervision;with adaptive equipment;sit to/from stand Pt Will Perform Toileting - Clothing Manipulation and hygiene: with supervision;sit to/from stand Additional ADL Goal #1: Pt will generalize energy conservation and breathing strategies in ADLs and mobility. Additional ADL Goal #2: Pt will be aware of availability of tub transfer bench for increased safety during showering.   OT Frequency:  Min 2X/week    Co-evaluation              AM-PAC OT "6 Clicks" Daily Activity     Outcome Measure Help from another person eating meals?: None Help from another person taking care of personal grooming?: A Little Help from another person toileting, which includes using toliet, bedpan, or urinal?: A Little Help from another person bathing (including washing, rinsing, drying)?: A Little Help from another person to put on and taking off regular upper body clothing?: A Little Help from another person to put on and taking off regular lower body clothing?: A Little 6 Click Score: 19   End of Session Equipment Utilized During Treatment: Gait belt  Activity Tolerance: Treatment limited secondary to medical complications (Comment) (low BP) Patient  left: in chair;with call bell/phone within reach;with chair alarm set  OT Visit Diagnosis: Muscle weakness (generalized) (M62.81)                Time: 4098-1191 OT Time Calculation (min): 41 min Charges:  OT General Charges $OT Visit: 1 Visit OT Evaluation $OT Eval Moderate Complexity: 1 Mod OT Treatments $Self Care/Home Management : 23-37 mins  Berna Spare, OTR/L Acute Rehabilitation Services Office: [phone]   Evern Bio 06/21/2023, 2:14 PM

## 2023-06-21 NOTE — Progress Notes (Addendum)
 PROGRESS NOTE    [name] [name]  WUJ:[id number] DOB: 1950/07/19 DOA: 06/18/2023 PCP: [name], MD   Brief Narrative:  [name] [name] is a 73 y.o. 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   Patient reports chronic shortness of breath and nonproductive cough at her baseline due to her underlying COPD.  3 days ago she began to experience progressively worsening shortness of breath occurring with minimal activity.  She denies any worsening cough.  Dyspnea was mostly exertional.   She has noticed some increased swelling in both her legs.  She denies chest pain, fevers, chills, diaphoresis.  She reports good urine output.   Patient states that her significant other recently passed away 2 weeks ago.  She is living at home with a new roommate now.  She states that she quit smoking years ago.   SpO2 86% on room air subsequently placed on 6 L O2 via [place] with SpO2 98%.  Due to increased work of breathing patient was placed on BiPAP afterwards.  COVID, flu and RSV negative.Portable chest x-ray showed bilateral lower lobe airspace opacities. CTA chest ruled out PE.  BNP slightly elevated around 700 but no previous baseline known. Patient was given IV Lasix 40 mg.  Admitted to hospital service.  Assessment & Plan:   Principal Problem:   Acute respiratory failure with hypoxia (HCC) Active Problems:   Hyperlipemia, mixed   Depression, major, single episode, moderate (HCC)   Essential hypertension   COPD (chronic obstructive pulmonary disease) with chronic bronchitis (HCC)   Coronary artery disease involving native coronary artery of native heart with angina pectoris (HCC)   Chronic kidney disease, stage 3a (HCC)   Acute on chronic congestive heart failure (HCC)  Acute respiratory failure with hypoxia secondary to acute on chronic congestive  heart failure with preserved ejection fraction: Last echo completed in 2020 shows normal ejection fraction and grade 1 diastolic dysfunction.  Upon arrival, patient was hypoxic, requiring BiPAP and eventually weaned to 4 L of oxygen.  Although chest x-ray shows possibility of bilateral lower lobe airspace opacities however patient with no fever and no leukocytosis as well as chronic fever, does not appear to have pneumonia clinically.  CTA negative for PE, scattered atelectasis and known scattered pulmonary nodules with no significant change from 2022.  Given stability over time, 1 year follow-up chest CT is recommended for high-risk patients. BNP elevated at 715 however no prior baseline known, she was thought to be having CHF exacerbation, started on IV Lasix 40 mg twice daily.  Echo shows right heart failure with normal ejection fraction and grade 1 diastolic dysfunction and right heart failure, cardiology consulted, they were planning to do right and left heart cath but due to hypotension, this was canceled and was rescheduled for today however now she has rising creatinine so they have postponed it for tomorrow.  Her lungs are clear to auscultation, doubt CHF as the source of hypoxia.  No wheezing either.  I wonder if she has severe PAH or obstructive sleep apnea.  Heart cath will help with determination.  COPD: No wheezing.  This is appears to be stable.  Continue bronchodilators.  Urinary retention: Patient has been having recurrent urinary retention, has required straight cath x 3 so far.  This time if the bladder scan will be over 450 cc, will place Foley catheter.  CAD/elevated  troponin: Denies chest pain.  Troponin elevated slightly around 100 but stable, does not indicate ACS.  Echo ruled out WMA.  History of frequent symptomatic PVCs: Continue flecainide and Toprol-XL.   Hypertension/now hypotensive: Due to recurrent hypotension requiring IV fluid support, Toprol-XL, amlodipine, lisinopril  as well as IV Lasix were held.  Blood pressure improving.  Monitor closely.   AKI on CKD stage IIIb: Baseline creatinine between 1.1-1.5.  Upon arrival, creatinine was at baseline however has been rising slowly and currently 1.72.  Avoid nephrotoxic agents.  She has been started on IV fluids by cardiology.  Hyperlipidemia: Not currently on statin.   Depression/anxiety: Continue Abilify.  DVT prophylaxis: enoxaparin (LOVENOX) injection 30 mg Start: 06/20/23 2030   Code Status: Do not attempt resuscitation (DNR) PRE-ARREST INTERVENTIONS DESIRED  Family Communication: None present at bedside.  Plan of care discussed with patient in length and he/she verbalized understanding and agreed with it.   Status is: Inpatient Remains inpatient appropriate because: Needs inpatient management.     Estimated body mass index is 37.33 kg/m as calculated from the following:   Height as of this encounter: 5' (1.524 m).   Weight as of this encounter: 86.7 kg.    Nutritional Assessment: Body mass index is 37.33 kg/m.Marland Kitchen Seen by dietician.  I agree with the assessment and plan as outlined below: Nutrition Status:        . Skin Assessment: I have examined the patient's skin and I agree with the wound assessment as performed by the wound care RN as outlined below:    Consultants:  None  Procedures:  None  Antimicrobials:  Anti-infectives (From admission, onward)    None         Subjective: Patient seen and examined.  Kitchen staff was in the room getting orders from her and I could clearly see that she was frustrated.  When I started talking to her, she remained frustrated and upset and was not giving me straight answers.  When I asked her how she was feeling, her answer was " I do not know, you tell me, you guys have been telling me how I am feeling".  Lengthy discussion with the patient, I calmed her down and reassured her that the questions that we are asking is only to find out how  she is feeling so we can provide the best medical care that she desires.  Her major complaint was generalized weakness.  She did not complain of shortness of breath.  Objective: Vitals:   06/20/23 2351 06/21/23 0400 06/21/23 0600 06/21/23 0735  BP: (!) 93/56 (!) 90/51 (!) 102/58 (!) 102/57  Pulse: 74 73 75 78  Resp: 15 17 15 18   Temp: 99.3 F (37.4 C) 99 F (37.2 C)  97.9 F (36.6 C)  TempSrc: Oral Oral  Oral  SpO2: 97% 95% 97% 98%  Weight:  86.7 kg    Height:        Intake/Output Summary (Last 24 hours) at 06/21/2023 0744 Last data filed at 06/21/2023 0111 Gross per 24 hour  Intake 1017.68 ml  Output 1350 ml  Net -332.32 ml   Filed Weights   06/18/23 1556 06/20/23 0500 06/21/23 0400  Weight: 86.6 kg 86.9 kg 86.7 kg    Examination:  General exam: Appears calm and comfortable  Respiratory system: Diminished breath sounds, no wheezes or crackles.Marland Kitchen Respiratory effort normal. Cardiovascular system: S1 & S2 heard, RRR. No JVD, murmurs, rubs, gallops or clicks.  Trace pitting edema bilateral lower extremity. Gastrointestinal  system: Abdomen is nondistended, soft and nontender. No organomegaly or masses felt. Normal bowel sounds heard. Central nervous system: Alert and oriented. No focal neurological deficits. Extremities: Symmetric 5 x 5 power.   Data Reviewed: I have personally reviewed following labs and imaging studies  CBC: Recent Labs  Lab 06/18/23 1648 06/19/23 0500  WBC 7.2 7.8  NEUTROABS 5.3  --   HGB 14.0 13.9  HCT 46.0 44.8  MCV 98.7 97.6  PLT 304 326   Basic Metabolic Panel: Recent Labs  Lab 06/18/23 1648 06/19/23 0500 06/20/23 0246 06/21/23 0244  NA 136 137 139 138  K 4.8 4.7 4.9 4.4  CL 97* 100 97* 97*  CO2 27 29 35* 32  GLUCOSE 111* 100* 108* 105*  BUN 20 23 36* 47*  CREATININE 1.08* 1.25* 1.58* 1.72*  CALCIUM 8.8* 8.6* 8.7* 8.6*  MG  --  2.0  --   --    GFR: Estimated Creatinine Clearance: 28.1 mL/min (A) (by C-G formula based on SCr of  1.72 mg/dL (H)). Liver Function Tests: Recent Labs  Lab 06/18/23 1648  AST 20  ALT 16  ALKPHOS 54  BILITOT 0.6  PROT 6.8  ALBUMIN 3.5   No results for input(s): "LIPASE", "AMYLASE" in the last 168 hours. No results for input(s): "AMMONIA" in the last 168 hours. Coagulation Profile: No results for input(s): "INR", "PROTIME" in the last 168 hours. Cardiac Enzymes: No results for input(s): "CKTOTAL", "CKMB", "CKMBINDEX", "TROPONINI" in the last 168 hours. BNP (last 3 results) No results for input(s): "PROBNP" in the last 8760 hours. HbA1C: No results for input(s): "HGBA1C" in the last 72 hours. CBG: No results for input(s): "GLUCAP" in the last 168 hours. Lipid Profile: No results for input(s): "CHOL", "HDL", "LDLCALC", "TRIG", "CHOLHDL", "LDLDIRECT" in the last 72 hours. Thyroid Function Tests: No results for input(s): "TSH", "T4TOTAL", "FREET4", "T3FREE", "THYROIDAB" in the last 72 hours. Anemia Panel: No results for input(s): "VITAMINB12", "FOLATE", "FERRITIN", "TIBC", "IRON", "RETICCTPCT" in the last 72 hours. Sepsis Labs: Recent Labs  Lab 06/20/23 0844  LATICACIDVEN 1.5    Recent Results (from the past 240 hours)  Resp panel by RT-PCR (RSV, Flu A&B, Covid) Anterior Nasal Swab     Status: None   Collection Time: 06/18/23  3:57 PM   Specimen: Anterior Nasal Swab  Result Value Ref Range Status   SARS Coronavirus 2 by RT PCR NEGATIVE NEGATIVE Final   Influenza A by PCR NEGATIVE NEGATIVE Final   Influenza B by PCR NEGATIVE NEGATIVE Final    Comment: (NOTE) The Xpert Xpress SARS-CoV-2/FLU/RSV plus assay is intended as an aid in the diagnosis of influenza from Nasopharyngeal swab specimens and should not be used as a sole basis for treatment. Nasal washings and aspirates are unacceptable for Xpert Xpress SARS-CoV-2/FLU/RSV testing.  Fact Sheet for Patients: BloggerCourse.com  Fact Sheet for Healthcare  Providers: SeriousBroker.it  This test is not yet approved or cleared by the [place] FDA and has been authorized for detection and/or diagnosis of SARS-CoV-2 by FDA under an Emergency Use Authorization (EUA). This EUA will remain in effect (meaning this test can be used) for the duration of the COVID-19 declaration under Section 564(b)(1) of the Act, 21 U.S.C. section 360bbb-3(b)(1), unless the authorization is terminated or revoked.     Resp Syncytial Virus by PCR NEGATIVE NEGATIVE Final    Comment: (NOTE) Fact Sheet for Patients: BloggerCourse.com  Fact Sheet for Healthcare Providers: SeriousBroker.it  This test is not yet approved or cleared by the  [place] [employer] trader and has been authorized for detection and/or diagnosis of SARS-CoV-2 by FDA under an TEFL teacher (EUA). This EUA will remain in effect (meaning this test can be used) for the duration of the COVID-19 declaration under Section 564(b)(1) of the Act, 21 U.S.C. section 360bbb-3(b)(1), unless the authorization is terminated or revoked.  Performed at [facility], [address], [place], [place] [address]      Radiology Studies: ECHOCARDIOGRAM COMPLETE Result Date: 06/19/2023    ECHOCARDIOGRAM REPORT   Patient Name:   [name] [name] Date of Exam: 06/19/2023 Medical Rec #:  [id number]    Height:       60.0 in Accession #:    [id number]   Weight:       82.2 lb Date of Birth:  Nov 06, 1950    BSA:          1.279 m Patient Age:    72 years     BP:           104/91 mmHg Patient Gender: F            HR:           95 bpm. Exam Location:  Inpatient Procedure: 2D Echo, Cardiac Doppler and Color Doppler (Both Spectral and Color            Flow Doppler were utilized during procedure). Indications:    Dyspnea  History:        Patient has prior history of Echocardiogram examinations, most                 recent 02/14/2019. CAD, COPD; Risk  Factors:Hypertension,                 Diabetes and Dyslipidemia.  Sonographer:    Meagan Baucom RDCS, FE, PE Referring Phys: Darreld Mclean MD IMPRESSIONS  1. Left ventricular ejection fraction, by estimation, is 60 to 65%. The left ventricle has normal function. The left ventricle has no regional wall motion abnormalities. Left ventricular diastolic parameters are consistent with Grade I diastolic dysfunction (impaired relaxation). There is the interventricular septum is flattened in systole and diastole, consistent with right ventricular pressure and volume overload.  2. Right ventricular systolic function is mildly reduced. The right ventricular size is normal.  3. The mitral valve is normal in structure. No evidence of mitral valve regurgitation. No evidence of mitral stenosis.  4. The aortic valve is tricuspid. Aortic valve regurgitation is trivial. Aortic valve sclerosis is present, with no evidence of aortic valve stenosis.  5. The inferior vena cava is normal in size with greater than 50% respiratory variability, suggesting right atrial pressure of 3 mmHg. FINDINGS  Left Ventricle: Left ventricular ejection fraction, by estimation, is 60 to 65%. The left ventricle has normal function. The left ventricle has no regional wall motion abnormalities. The left ventricular internal cavity size was normal in size. There is  no left ventricular hypertrophy. The interventricular septum is flattened in systole and diastole, consistent with right ventricular pressure and volume overload. Left ventricular diastolic parameters are consistent with Grade I diastolic dysfunction (impaired relaxation). Right Ventricle: The right ventricular size is normal. Right ventricular systolic function is mildly reduced. Left Atrium: Left atrial size was normal in size. Right Atrium: Right atrial size was normal in size. Pericardium: Trivial pericardial effusion is present. Mitral Valve: The mitral valve is normal in structure. No  evidence of mitral valve regurgitation. No evidence of mitral valve stenosis. Tricuspid Valve: The tricuspid  valve is normal in structure. Tricuspid valve regurgitation is trivial. No evidence of tricuspid stenosis. Aortic Valve: The aortic valve is tricuspid. Aortic valve regurgitation is trivial. Aortic valve sclerosis is present, with no evidence of aortic valve stenosis. Aortic valve mean gradient measures 7.0 mmHg. Aortic valve peak gradient measures 12.7 mmHg.  Aortic valve area, by VTI measures 2.58 cm. Pulmonic Valve: The pulmonic valve was normal in structure. Pulmonic valve regurgitation is not visualized. No evidence of pulmonic stenosis. Aorta: The aortic root is normal in size and structure. Venous: The inferior vena cava is normal in size with greater than 50% respiratory variability, suggesting right atrial pressure of 3 mmHg. IAS/Shunts: No atrial level shunt detected by color flow Doppler.  LEFT VENTRICLE PLAX 2D LVIDd:         4.70 cm   Diastology LVIDs:         3.00 cm   LV e' medial:    5.66 cm/s LV PW:         1.10 cm   LV E/e' medial:  12.9 LV IVS:        1.10 cm   LV e' lateral:   11.10 cm/s LVOT diam:     2.20 cm   LV E/e' lateral: 6.6 LV SV:         74 LV SV Index:   58 LVOT Area:     3.80 cm  RIGHT VENTRICLE RV Basal diam:  2.30 cm RV Mid diam:    1.90 cm RV S prime:     12.60 cm/s TAPSE (M-mode): 1.2 cm LEFT ATRIUM             Index        RIGHT ATRIUM           Index LA diam:        3.20 cm 2.50 cm/m   RA Area:     13.50 cm LA Vol (A2C):   35.3 ml 27.59 ml/m  RA Volume:   27.70 ml  21.65 ml/m LA Vol (A4C):   36.7 ml 28.69 ml/m LA Biplane Vol: 37.7 ml 29.47 ml/m  AORTIC VALVE AV Area (Vmax):    2.65 cm AV Area (Vmean):   2.49 cm AV Area (VTI):     2.58 cm AV Vmax:           178.00 cm/s AV Vmean:          116.000 cm/s AV VTI:            0.286 m AV Peak Grad:      12.7 mmHg AV Mean Grad:      7.0 mmHg LVOT Vmax:         124.00 cm/s LVOT Vmean:        76.000 cm/s LVOT VTI:           0.194 m LVOT/AV VTI ratio: 0.68  AORTA Ao Root diam: 3.10 cm Ao Asc diam:  3.50 cm MITRAL VALVE MV Area (PHT): 4.63 cm    SHUNTS MV Decel Time: 164 msec    Systemic VTI:  0.19 m MV E velocity: 72.80 cm/s  Systemic Diam: 2.20 cm MV A velocity: 95.90 cm/s MV E/A ratio:  0.76 Olga Millers MD Electronically signed by Olga Millers MD Signature Date/Time: 06/19/2023/11:48:10 AM    Final     Scheduled Meds:  ARIPiprazole  2 mg Oral Daily   aspirin EC  81 mg Oral Daily   enoxaparin (LOVENOX) injection  30 mg Subcutaneous  Q24H   sodium chloride flush  3 mL Intravenous Q12H   Continuous Infusions:  sodium chloride 100 mL/hr at 06/21/23 0654   promethazine (PHENERGAN) injection (IM or IVPB)       LOS: 2 days   Hughie Closs, MD [facility]  06/21/2023, 7:44 AM   *Please note that this is a verbal dictation therefore any spelling or grammatical errors are due to the "Dragon Medical One" system interpretation.  Please page via Amion and do not message via secure chat for urgent patient care matters. Secure chat can be used for non urgent patient care matters.  How to contact the [facility] Attending or Consulting provider 7A - 7P or covering provider during after hours 7P -7A, for this patient?  Check the care team in [facility] and look for a) attending/consulting [facility] provider listed and b) the [facility] team listed. Page or secure chat 7A-7P. Log into [email or web address] and use [place] universal password to access. If you do not have the password, please contact the hospital operator. Locate the [facility] provider you are looking for under [facility] and page to a number that you can be directly reached. If you still have difficulty reaching the provider, please page the [facility] (Director on Call) for the Hospitalists listed on amion for assistance.

## 2023-06-22 ENCOUNTER — Encounter (HOSPITAL_COMMUNITY)
Admission: EM | Disposition: A | Discharge status: Skilled Nursing Facility | Payer: Self-pay | Source: Home / Self Care | Attending: Internal Medicine

## 2023-06-22 ENCOUNTER — Encounter (HOSPITAL_COMMUNITY): Payer: Self-pay | Admitting: Cardiovascular Disease

## 2023-06-22 DIAGNOSIS — I5033 Acute on chronic diastolic (congestive) heart failure: Secondary | ICD-10-CM | POA: Diagnosis not present

## 2023-06-22 DIAGNOSIS — I2510 Atherosclerotic heart disease of native coronary artery without angina pectoris: Secondary | ICD-10-CM

## 2023-06-22 DIAGNOSIS — J9601 Acute respiratory failure with hypoxia: Secondary | ICD-10-CM | POA: Diagnosis not present

## 2023-06-22 HISTORY — PX: RIGHT/LEFT HEART CATH AND CORONARY ANGIOGRAPHY: CATH118266

## 2023-06-22 LAB — POCT I-STAT EG7
Acid-Base Excess: 2 mmol/L (ref 0.0–2.0)
Bicarbonate: 29.8 mmol/L — ABNORMAL HIGH (ref 20.0–28.0)
Calcium, Ion: 1.14 mmol/L — ABNORMAL LOW (ref 1.15–1.40)
HCT: 40 % (ref 36.0–46.0)
Hemoglobin: 13.6 g/dL (ref 12.0–15.0)
O2 Saturation: 58 %
Potassium: 4.2 mmol/L (ref 3.5–5.1)
Sodium: 141 mmol/L (ref 135–145)
TCO2: 32 mmol/L (ref 22–32)
pCO2, Ven: 59 mmHg (ref 44–60)
pH, Ven: 7.312 (ref 7.25–7.43)
pO2, Ven: 34 mmHg (ref 32–45)

## 2023-06-22 LAB — BASIC METABOLIC PANEL
Anion gap: 8 (ref 5–15)
BUN: 50 mg/dL — ABNORMAL HIGH (ref 8–23)
CO2: 29 mmol/L (ref 22–32)
Calcium: 8 mg/dL — ABNORMAL LOW (ref 8.9–10.3)
Chloride: 104 mmol/L (ref 98–111)
Creatinine, Ser: 1.5 mg/dL — ABNORMAL HIGH (ref 0.44–1.00)
GFR, Estimated: 36 mL/min — ABNORMAL LOW (ref >60)
Glucose, Bld: 125 mg/dL — ABNORMAL HIGH (ref 70–99)
Potassium: 4.5 mmol/L (ref 3.5–5.1)
Sodium: 141 mmol/L (ref 135–145)

## 2023-06-22 LAB — CBC WITH DIFFERENTIAL/PLATELET
Abs Immature Granulocytes: 0.03 10*3/uL (ref 0.00–0.07)
Basophils Absolute: 0.1 10*3/uL (ref 0.0–0.1)
Basophils Relative: 1 %
Eosinophils Absolute: 0.5 10*3/uL (ref 0.0–0.5)
Eosinophils Relative: 6 %
HCT: 41.3 % (ref 36.0–46.0)
Hemoglobin: 12.8 g/dL (ref 12.0–15.0)
Immature Granulocytes: 0 %
Lymphocytes Relative: 25 %
Lymphs Abs: 2.1 10*3/uL (ref 0.7–4.0)
MCH: 30.2 pg (ref 26.0–34.0)
MCHC: 31 g/dL (ref 30.0–36.0)
MCV: 97.4 fL (ref 80.0–100.0)
Monocytes Absolute: 0.9 10*3/uL (ref 0.1–1.0)
Monocytes Relative: 11 %
Neutro Abs: 4.8 10*3/uL (ref 1.7–7.7)
Neutrophils Relative %: 57 %
Platelets: 299 10*3/uL (ref 150–400)
RBC: 4.24 MIL/uL (ref 3.87–5.11)
RDW: 13.4 % (ref 11.5–15.5)
WBC: 8.4 10*3/uL (ref 4.0–10.5)
nRBC: 0 % (ref 0.0–0.2)

## 2023-06-22 SURGERY — RIGHT/LEFT HEART CATH AND CORONARY ANGIOGRAPHY
Anesthesia: LOCAL

## 2023-06-22 MED ORDER — MIDAZOLAM HCL 2 MG/2ML IJ SOLN
INTRAMUSCULAR | Status: AC
  Filled 2023-06-22: qty 2

## 2023-06-22 MED ORDER — ONDANSETRON HCL 4 MG/2ML IJ SOLN
INTRAMUSCULAR | Status: AC
  Filled 2023-06-22: qty 2

## 2023-06-22 MED ORDER — ALPRAZOLAM 0.5 MG PO TABS
0.5000 mg | ORAL_TABLET | Freq: Once | ORAL | Status: AC
  Administered 2023-06-22: 0.5 mg via ORAL
  Filled 2023-06-22: qty 1

## 2023-06-22 MED ORDER — SODIUM CHLORIDE 0.9% FLUSH: 3.0000 mL | INTRAVENOUS | Status: DC | PRN

## 2023-06-22 MED ORDER — IOHEXOL 350 MG/ML SOLN
INTRAVENOUS | Status: DC | PRN
  Administered 2023-06-22: 40 mL

## 2023-06-22 MED ORDER — VERAPAMIL HCL 2.5 MG/ML IV SOLN
INTRAVENOUS | Status: AC
  Filled 2023-06-22: qty 2

## 2023-06-22 MED ORDER — HEPARIN SODIUM (PORCINE) 1000 UNIT/ML IJ SOLN
INTRAMUSCULAR | Status: DC | PRN
  Administered 2023-06-22: 5000 [IU] via INTRAVENOUS

## 2023-06-22 MED ORDER — SODIUM CHLORIDE 0.9 % IV SOLN: 250.0000 mL | INTRAVENOUS | Status: AC | PRN

## 2023-06-22 MED ORDER — FENTANYL CITRATE (PF) 100 MCG/2ML IJ SOLN
INTRAMUSCULAR | Status: DC | PRN
  Administered 2023-06-22 (×2): 25 ug via INTRAVENOUS

## 2023-06-22 MED ORDER — ENOXAPARIN SODIUM 40 MG/0.4ML IJ SOSY
40.0000 mg | PREFILLED_SYRINGE | INTRAMUSCULAR | Status: DC
  Administered 2023-06-23 – 2023-07-06 (×14): 40 mg via SUBCUTANEOUS
  Filled 2023-06-22 (×14): qty 0.4

## 2023-06-22 MED ORDER — LIDOCAINE HCL (PF) 1 % IJ SOLN
INTRAMUSCULAR | Status: AC
  Filled 2023-06-22: qty 30

## 2023-06-22 MED ORDER — LIDOCAINE HCL (PF) 1 % IJ SOLN
INTRAMUSCULAR | Status: DC | PRN
  Administered 2023-06-22: 5 mL via INTRADERMAL

## 2023-06-22 MED ORDER — HEPARIN (PORCINE) IN NACL 1000-0.9 UT/500ML-% IV SOLN
INTRAVENOUS | Status: DC | PRN
  Administered 2023-06-22 (×2): 500 mL

## 2023-06-22 MED ORDER — VERAPAMIL HCL 2.5 MG/ML IV SOLN
INTRAVENOUS | Status: DC | PRN
  Administered 2023-06-22: 10 mL via INTRA_ARTERIAL

## 2023-06-22 MED ORDER — SODIUM CHLORIDE 0.9% FLUSH
3.0000 mL | Freq: Two times a day (BID) | INTRAVENOUS | Status: DC
  Administered 2023-06-23 – 2023-06-28 (×10): 3 mL via INTRAVENOUS

## 2023-06-22 MED ORDER — MIDAZOLAM HCL 2 MG/2ML IJ SOLN
INTRAMUSCULAR | Status: DC | PRN
  Administered 2023-06-22 (×2): 1 mg via INTRAVENOUS

## 2023-06-22 MED ORDER — HEPARIN SODIUM (PORCINE) 1000 UNIT/ML IJ SOLN
INTRAMUSCULAR | Status: AC
  Filled 2023-06-22: qty 10

## 2023-06-22 MED ORDER — FENTANYL CITRATE (PF) 100 MCG/2ML IJ SOLN
INTRAMUSCULAR | Status: AC
  Filled 2023-06-22: qty 2

## 2023-06-22 SURGICAL SUPPLY — 12 items
CATH INFINITI 5 FR JL3.5 (CATHETERS) IMPLANT
CATH INFINITI JR4 5F (CATHETERS) IMPLANT
CATH SWAN GANZ 7F STRAIGHT (CATHETERS) IMPLANT
DEVICE RAD COMP TR BAND LRG (VASCULAR PRODUCTS) IMPLANT
GLIDESHEATH SLEND SS 6F .021 (SHEATH) IMPLANT
GLIDESHEATH SLENDER 7FR .021G (SHEATH) IMPLANT
GUIDEWIRE INQWIRE 1.5J.035X260 (WIRE) IMPLANT
INQWIRE 1.5J .035X260CM (WIRE) ×1 IMPLANT
PACK CARDIAC CATHETERIZATION (CUSTOM PROCEDURE TRAY) ×1 IMPLANT
SET ATX-X65L (MISCELLANEOUS) IMPLANT
SHEATH PROBE COVER 6X72 (BAG) IMPLANT
WIRE HI TORQ VERSACORE-J 145CM (WIRE) IMPLANT

## 2023-06-22 NOTE — Progress Notes (Signed)
 Occupational Therapy Treatment Patient Details Name: [name] [name] MRN: [id number] DOB: 02/06/1951 Today's Date: 06/22/2023   History of present illness Pt is a 73 y.o. F admitted 06/18/23 with SOB and associated hypoxia. CTA chest neg for PE, had small bilat pleural effusions and small nodules. Pt was diuresed, found to be hypotensive, and received 500 cc of fluid. 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   OT comments  Pt with upcoming heart cath today, NPO. Reports having anxiety last night and not sleeping well. Agreeable to grooming EOB with encouragement, completed with set up/supervision. Declined OOB to sink or chair preferring to return to supine. O2 sats 93-100% on 2L O2. Pt reports feeling overwhelmed with her health situation, lost her partner last month and tearful at times. Patient will benefit from continued inpatient follow up therapy, <3 hours/day.      If plan is discharge home, recommend the following:  A little help with walking and/or transfers;A little help with bathing/dressing/bathroom;Assistance with cooking/housework;Assist for transportation;Help with stairs or ramp for entrance   Equipment Recommendations  Other (comment) (recommend tub seat, pt unable to afford items not covered by her insurance)    Recommendations for Other Services      Precautions / Restrictions Precautions Precautions: Fall Recall of Precautions/Restrictions: Intact Restrictions Weight Bearing Restrictions Per Provider Order: No       Mobility Bed Mobility Overal bed mobility: Needs Assistance Bed Mobility: Supine to Sit, Sit to Supine     Supine to sit: HOB elevated, Used rails, Min assist Sit to supine: Supervision   General bed mobility comments: pulled trunk up on therapist's hand    Transfers                   General transfer comment: pt declined OOB to chair, reports she has an upcoming procedure     Balance Overall balance assessment:  Needs assistance   Sitting balance-Leahy Scale: Fair                                     ADL either performed or assessed with clinical judgement   ADL Overall ADL's : Needs assistance/impaired Eating/Feeding: NPO (for procedure)   Grooming: Wash/dry hands;Wash/dry face;Oral care;Brushing hair;Set up;Sitting                                      Extremity/Trunk Assessment              Vision       Perception     Praxis     Communication Communication Communication: No apparent difficulties   Cognition Arousal: Alert Behavior During Therapy: WFL for tasks assessed/performed Cognition: Cognition impaired     Awareness: Online awareness impaired, Intellectual awareness intact Memory impairment (select all impairments): Short-term memory   Executive functioning impairment (select all impairments): Initiation, Problem solving                   Following commands: Impaired Following commands impaired: Follows one step commands with increased time      Cueing   Cueing Techniques: Verbal cues, Gestural cues  Exercises      Shoulder Instructions       General Comments      Pertinent Vitals/ Pain       Pain Assessment  Pain Assessment: No/denies pain  Home Living                                          Prior Functioning/Environment              Frequency  Min 2X/week        Progress Toward Goals  OT Goals(current goals can now be found in the care plan section)  Progress towards OT goals: Progressing toward goals  Acute Rehab OT Goals OT Goal Formulation: With patient Time For Goal Achievement: 07/05/23 Potential to Achieve Goals: Good  Plan      Co-evaluation                 AM-PAC OT "6 Clicks" Daily Activity     Outcome Measure   Help from another person eating meals?: None Help from another person taking care of personal grooming?: A Little Help from another person  toileting, which includes using toliet, bedpan, or urinal?: A Little Help from another person bathing (including washing, rinsing, drying)?: A Little Help from another person to put on and taking off regular upper body clothing?: A Little Help from another person to put on and taking off regular lower body clothing?: A Little 6 Click Score: 19    End of Session Equipment Utilized During Treatment: Oxygen (2L)  OT Visit Diagnosis: Muscle weakness (generalized) (M62.81);Other symptoms and signs involving cognitive function   Activity Tolerance Patient limited by fatigue   Patient Left in bed;with call bell/phone within reach;with bed alarm set   Nurse Communication          Time: 0902-0920 OT Time Calculation (min): 18 min  Charges: OT General Charges $OT Visit: 1 Visit OT Treatments $Self Care/Home Management : 8-22 mins  Berna Spare, OTR/L Acute Rehabilitation Services Office: [phone]   Evern Bio 06/22/2023, 9:25 AM

## 2023-06-22 NOTE — Progress Notes (Signed)
 Patient requested belongings be sent to security. Valuables tallied and counted with Doyne Keel, RN at bedside in front of patient. Patient confirmed count. Security picked up valuables and taken with them. Yellow sheet placed in chart.

## 2023-06-22 NOTE — Interval H&P Note (Signed)
 History and Physical Interval Note:  06/22/2023 3:37 PM  [name] [name]  has presented today for surgery, with the diagnosis of heart failure.  The various methods of treatment have been discussed with the patient and family. After consideration of risks, benefits and other options for treatment, the patient has consented to  Procedure(s): RIGHT/LEFT HEART CATH AND CORONARY ANGIOGRAPHY (N/A) as a surgical intervention.  The patient's history has been reviewed, patient examined, no change in status, stable for surgery.  I have reviewed the patient's chart and labs.  Questions were answered to the patient's satisfaction.     Tonny Bollman

## 2023-06-22 NOTE — Progress Notes (Signed)
   Heart Failure Stewardship Pharmacist Progress Note   PCP: [name], MD PCP-Cardiologist: Verne Carrow, MD    HPI:  73 y.o. female with PMH of RA, GERD, allergic rhinitis, osteoarthritis, HLD, HTN, COPD, CAD, CKD (stage 3a), and depression/anxiety.  She presented to ED on 03/17 with complaints of worsening SOB. Patient reported she is typically SOB and has a non-productive cough at baseline due to COPD, but 3 days ago she noticed DOE with minimal activity. She also reported lower extremity swelling.  Initial vitals were 152/93, HR 84, RR 32. She was given 6 L O2 via [place] and later transitioned to BiPAP. BNP 715.9. She was given Lasix IV 40 mg prior to admission. CXR on 03/17 showed bilateral lower lobe airspace opacities could reflect atelectasis or pneumonia. Chest CT on 03/17 revealed no evidence of pulmonary embolism, scattered atelectasis or scarring bilaterally, small bilateral pleural effusions, scattered pulmonary nodules not significantly changed from 2022, multi-vessel coronary artery calcifications, aortic arthrosclerosis with aneurysmal dilation of the ascending aorta. She does follow cardiology for CAD. Her SO recently passed ~2 weeks ago. ECHO on 03/18 revealed LVEF of 60-65% (unchanged from 2020), LV has normal function, no regional wall abnormalities, GIDD, signs of RV pressure and volume overload, MV is normal in structure, and right atrial pressure of 3 mmHg.   R/LHC scheduled for 3/21.  Today she states her breathing is about the same. Awaiting cath today. Foley catheter placed on 3/20 for urinary retention.  Current HF Medications: None  Prior to admission HF Medications: Beta blocker: Metoprolol succinate 50 mg every day (last dispensed 05/2023 for 90 ds) ACE/ARB/ARNI: Lisinopril 10 mg every day (last dispensed 05/2023 for 90 ds) Other: Amlodipine 5 mg every day (last dispensed 06/2023 for 90 ds) and Imdur 30 mg every day (patient reported not taking; last  dispensed 06/2023 for 90 ds)  Pertinent Lab Values: Serum creatinine 1.72>1.50, BUN 50, Potassium 4.5, Sodium 141, BNP 715.9, Magnesium 2 (03/18), A1c 5.8 (05/2023)  Vital Signs: Weight: 199 lbs - bed weight (admission weight: 190.9 lbs - estimated) Blood pressure: 90-110/50s Heart rate: 80s I/O: net +0.2 L yesterday; net -1.4 L since admission  Medication Assistance / Insurance Benefits Check: Does the patient have prescription insurance?  Yes Type of insurance plan: UHC Medicare  Does the patient qualify for medication assistance through manufacturers or grants?   Yes  Outpatient Pharmacy:  Prior to admission outpatient pharmacy:  [id number] - [place], [place] - [address]   Is the patient willing to use [facility] TOC pharmacy at discharge? Yes Is the patient willing to transition their outpatient pharmacy to utilize a [facility] outpatient pharmacy?   No    Assessment: 1. Acute on chronic HFpEF (LVEF 60-65%). NYHA class III symptoms. - Strict I/Os and daily weights. Keep K>4, Mg>2 - Follow up cath results to determine further need for diuretics - Can consider adding spironolactone pending further improvement in renal function and BP - No SGLT2i yet with urinary retention requiring foley placement on 3/20   Plan: 1) Medication changes recommended at this time: - No changes pending cath today  2) Patient assistance: -She is agreeable to using [facility] TOC pharmacy at discharge  3)  Education  - To be completed prior to discharge  Sharen Hones, PharmD, BCPS Heart Failure Stewardship Pharmacist Phone [phone]

## 2023-06-22 NOTE — H&P (View-Only) (Signed)
 Patient Name: [name] [name] Date of Encounter: 06/22/2023 [facility] Cardiologist: Verne Carrow, MD   Interval Summary  .    Breathing is about the same, pending R/LHC today.   Vital Signs .    Vitals:   06/22/23 0420 06/22/23 0423 06/22/23 0500 06/22/23 0841  BP:  103/60 108/60 114/70  Pulse: 98 94 87 86  Resp: 16 (!) 23 19 (!) 22  Temp:  97.6 F (36.4 C)  98.3 F (36.8 C)  TempSrc:  Oral  Oral  SpO2: (!) 82% 93% 99% 93%  Weight:   90.3 kg   Height:        Intake/Output Summary (Last 24 hours) at 06/22/2023 1008 Last data filed at 06/22/2023 0601 Gross per 24 hour  Intake 2534.36 ml  Output 1450 ml  Net 1084.36 ml      06/22/2023    5:00 AM 06/21/2023    4:00 AM 06/20/2023    5:00 AM  Last 3 Weights  Weight (lbs) 199 lb 1.2 oz 191 lb 2.2 oz 191 lb 9.3 oz  Weight (kg) 90.3 kg 86.7 kg 86.9 kg      Telemetry/ECG    Sinus rhythm with PVCs - Personally Reviewed  Physical Exam .    GEN: No acute distress, [place] @  Neck: No JVD Cardiac: RRR, no murmurs, rubs, or gallops.  Respiratory: Clear to auscultation bilaterally. GI: Soft, nontender, non-distended  MS: No edema  Assessment & Plan .     Rosalia Mcavoy is a 73 y.o. female with a hx of CAD, HTN, HLD, CKD IIIa, COPD, OA who is being seen 06/20/2023 for the evaluation of CHF at the request of Dr. Jacqulyn Bath.   Acute hypoxic respiratory failure Suspected OSA Possible pulmonary HTN COPD -- presented with worsening dyspnea on exertion over the past couple of weeks. BNP 715, CT chest with small bilateral effusions -- s/p IV lasix, net - 2.4L, but developed hypotension yesterday requiring NS bolus -- echo 3/18 with LVEF of 60-65%, g1DD, mildly reduced RV with interventricular septum flattening consistent with RV pressure and volume overload -- for now, holding IV lasix. Receiving IVFs -- plan for [facility] today    Nonobstructive CAD -- cardiac cath 2017 with 50% mid LAD stenosis, mild disease  of RCA and circumflex with recommendations for medical management  -- denies any chest pain PTA -- echo showed LVEF of 60-65%, no rWMA -- hsTn 97>>104, EKG without ischemic changes  -- as above, planned for LHC    PVCs -- has been on flecainide 50mg  TID historically but with hx of CAD would favor stopping -- will plan to resume metoprolol pending stable BP    CKD IIIa with AKI -- baseline Cr around 1.2-1.3, up to 1.58>> 1.7>>1.5 -- as above, holding lasix and BP meds -- continue IVFs      For questions or updates, please contact [facility] Please consult [email or web address] for contact info under        Signed, Laverda Page, NP   ATTENDING ATTESTATION:  After conducting a review of all available clinical information with the care team, interviewing the patient, and performing a physical exam, I agree with the findings and plan described in this note.   GEN: No acute distress.   HEENT:  MMM, no JVD, no scleral icterus Cardiac: RRR, no murmurs, rubs, or gallops.  Respiratory: Decreased BS at bases GI: Soft, nontender, non-distended  MS: No edema; No deformity. Neuro:  Nonfocal  Vasc:  +  2 radial pulses  Patient unchanged versus yesterday.  Per my note yesterday, her biggest issue seems to be fatigue and dyspnea.  Dyspnea may be an anginal equivalent but it occurs at rest and with exertion (unclear if routinely).  I am particularly interested in her filling pressures as her TTE suggests RV overload so characterizing her pulmonary pressures and whether she has pulmonary venous vs pulmonary arterial hypertension will be important.  Will will pursue coronary angiography today.  Given her atypical symptoms, I think PCI should be pursued for lesions that may be deemed causative for her atypical symptoms (I.e. prox LAD, prox disease of large RCA or large Lcx).  If the invasive study is unrevealing, then looking at noncardiac etiologies will need to be pursued.  I have reviewed  the risks, indications, and alternatives to cardiac catheterization, possible angioplasty, and stenting with the patient. Risks include but are not limited to bleeding, infection, vascular injury, stroke, myocardial infection, arrhythmia, kidney injury, radiation-related injury in the case of prolonged fluoroscopy use, emergency cardiac surgery, and death. The patient understands the risks of serious complication is 1-2 in 1000 with diagnostic cardiac cath and 1-2% or less with angioplasty/stenting.     Alverda Skeans, MD Pager [phone]

## 2023-06-22 NOTE — Progress Notes (Signed)
 PROGRESS NOTE    [name] [name]  BMW:[id number] DOB: 1950-10-18 DOA: 06/18/2023 PCP: [name], MD   Brief Narrative:  [name] [name] is a 73 y.o. 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  She denies any worsening cough.  Dyspnea was mostly exertional.   She has noticed some increased swelling in both her legs.   She reports good urine output.   Patient states that her significant other recently passed away 2 weeks ago.  She is living at home with a new roommate now.  She states that she quit smoking years ago.   Due to increased work of breathing patient was placed on BiPAP afterwards.  COVID, flu and RSV negative.Portable chest x-ray showed bilateral lower lobe airspace opacities. CTA chest ruled out PE.  BNP slightly elevated around 700 but no previous baseline known. Patient was given IV Lasix 40 mg.  Admitted to hospital service.  Eventually cardiology consulted.  Details below.  Assessment & Plan:   Principal Problem:   Acute respiratory failure with hypoxia (HCC) Active Problems:   Hyperlipemia, mixed   Depression, major, single episode, moderate (HCC)   Essential hypertension   COPD (chronic obstructive pulmonary disease) with chronic bronchitis (HCC)   Coronary artery disease involving native coronary artery of native heart with angina pectoris (HCC)   Chronic kidney disease, stage 3a (HCC)   Acute on chronic congestive heart failure (HCC)  Acute respiratory failure with hypoxia secondary to acute on chronic congestive heart failure with preserved ejection fraction: Last echo completed in 2020 shows normal ejection fraction and grade 1 diastolic dysfunction.  Upon arrival, patient was hypoxic, requiring BiPAP and eventually weaned to 4 L of oxygen.  Although chest x-ray shows possibility of  bilateral lower lobe airspace opacities however patient with no fever and no leukocytosis as well as chronic fever, does not appear to have pneumonia clinically.  CTA negative for PE, scattered atelectasis and known scattered pulmonary nodules with no significant change from 2022.  Given stability over time, 1 year follow-up chest CT is recommended for high-risk patients. BNP elevated at 715 however no prior baseline known, she was thought to be having CHF exacerbation, started on IV Lasix 40 mg twice daily.  Echo shows right heart failure with normal ejection fraction and grade 1 diastolic dysfunction and right heart failure, cardiology consulted, they were planning to do right and left heart cath but due to hypotension, this was canceled and was rescheduled for 06/21/2023, however due to rising creatinine, this was postponed for 06/22/2023, patient given IV fluids, renal function improving.  She has been weaned to 2 L nasal cannula oxygen.  COPD: No wheezing.  This is appears to be stable.  Continue bronchodilators.  Urinary retention: Patient having recurrent urinary retention, required straight cath x 3 so far and eventually indwelling Foley catheter was placed 06/21/2023.  CAD/elevated troponin: Denies chest pain.  Troponin elevated slightly around 100 but stable, does not indicate ACS.  Echo ruled out WMA.  History of frequent symptomatic PVCs: Continue flecainide and Toprol-XL.   Hypertension/now hypotensive: Due to recurrent hypotension requiring IV fluid support, Toprol-XL, amlodipine, lisinopril as well as IV Lasix were held.  She received small fluid boluses followed by continuous normal saline started by cardiology yesterday.  Blood pressure improving.   AKI on CKD stage IIIb: Baseline  creatinine between 1.1-1.5.  Upon arrival, creatinine was at baseline however it peaked at 1.72 and improved to 1.5 today.  Avoid nephrotoxic agents.    Hyperlipidemia: Not currently on statin.    Depression/anxiety: Continue Abilify.  DVT prophylaxis: enoxaparin (LOVENOX) injection 30 mg Start: 06/20/23 2030   Code Status: Do not attempt resuscitation (DNR) PRE-ARREST INTERVENTIONS DESIRED  Family Communication: None present at bedside.  Plan of care discussed with patient in length and he/she verbalized understanding and agreed with it.   Status is: Inpatient Remains inpatient appropriate because: Needs inpatient management.     Estimated body mass index is 38.88 kg/m as calculated from the following:   Height as of this encounter: 5' (1.524 m).   Weight as of this encounter: 90.3 kg.    Nutritional Assessment: Body mass index is 38.88 kg/m.[place] Kitchen Seen by dietician.  I agree with the assessment and plan as outlined below: Nutrition Status:        . Skin Assessment: I have examined the patient's skin and I agree with the wound assessment as performed by the wound care RN as outlined below:    Consultants:  None  Procedures:  None  Antimicrobials:  Anti-infectives (From admission, onward)    None         Subjective: Patient seen and examined.  When I asked her how she was feeling, her onset was " I am here".  Then I asked her how his breathing was, her onset was " I am breathing, that is a very silly question doc".  For some reason, patient has been answering that way and has remained frustrated.    Objective: Vitals:   06/22/23 0005 06/22/23 0420 06/22/23 0423 06/22/23 0500  BP: (!) 103/52  103/60 108/60  Pulse: 93 98 94 87  Resp: (!) 21 16 (!) 23 19  Temp: 98.4 F (36.9 C)  97.6 F (36.4 C)   TempSrc: Oral  Oral   SpO2: 98% (!) 82% 93% 99%  Weight:    90.3 kg  Height:        Intake/Output Summary (Last 24 hours) at 06/22/2023 0811 Last data filed at 06/22/2023 0601 Gross per 24 hour  Intake 2534.36 ml  Output 1450 ml  Net 1084.36 ml   Filed Weights   06/20/23 0500 06/21/23 0400 06/22/23 0500  Weight: 86.9 kg 86.7 kg 90.3 kg     Examination:  General exam: Appears calm and comfortable  Respiratory system: Faint bibasilar crackles. Respiratory effort normal. Cardiovascular system: S1 & S2 heard, RRR. No JVD, murmurs, rubs, gallops or clicks. No pedal edema. Gastrointestinal system: Abdomen is nondistended, soft and nontender. No organomegaly or masses felt. Normal bowel sounds heard. Central nervous system: Alert and oriented. No focal neurological deficits. Extremities: Symmetric 5 x 5 power. Skin: No rashes, lesions or ulcers.    Data Reviewed: I have personally reviewed following labs and imaging studies  CBC: Recent Labs  Lab 06/18/23 1648 06/19/23 0500 06/22/23 0305  WBC 7.2 7.8 8.4  NEUTROABS 5.3  --  4.8  HGB 14.0 13.9 12.8  HCT 46.0 44.8 41.3  MCV 98.7 97.6 97.4  PLT 304 326 299   Basic Metabolic Panel: Recent Labs  Lab 06/18/23 1648 06/19/23 0500 06/20/23 0246 06/21/23 0244 06/22/23 0305  NA 136 137 139 138 141  K 4.8 4.7 4.9 4.4 4.5  CL 97* 100 97* 97* 104  CO2 27 29 35* 32 29  GLUCOSE 111* 100* 108* 105* 125*  BUN 20 23 36*  47* 50*  CREATININE 1.08* 1.25* 1.58* 1.72* 1.50*  CALCIUM 8.8* 8.6* 8.7* 8.6* 8.0*  MG  --  2.0  --   --   --    GFR: Estimated Creatinine Clearance: 32.9 mL/min (A) (by C-G formula based on SCr of 1.5 mg/dL (H)). Liver Function Tests: Recent Labs  Lab 06/18/23 1648  AST 20  ALT 16  ALKPHOS 54  BILITOT 0.6  PROT 6.8  ALBUMIN 3.5   No results for input(s): "LIPASE", "AMYLASE" in the last 168 hours. No results for input(s): "AMMONIA" in the last 168 hours. Coagulation Profile: No results for input(s): "INR", "PROTIME" in the last 168 hours. Cardiac Enzymes: No results for input(s): "CKTOTAL", "CKMB", "CKMBINDEX", "TROPONINI" in the last 168 hours. BNP (last 3 results) No results for input(s): "PROBNP" in the last 8760 hours. HbA1C: No results for input(s): "HGBA1C" in the last 72 hours. CBG: No results for input(s): "GLUCAP" in the last  168 hours. Lipid Profile: No results for input(s): "CHOL", "HDL", "LDLCALC", "TRIG", "CHOLHDL", "LDLDIRECT" in the last 72 hours. Thyroid Function Tests: No results for input(s): "TSH", "T4TOTAL", "FREET4", "T3FREE", "THYROIDAB" in the last 72 hours. Anemia Panel: No results for input(s): "VITAMINB12", "FOLATE", "FERRITIN", "TIBC", "IRON", "RETICCTPCT" in the last 72 hours. Sepsis Labs: Recent Labs  Lab 06/20/23 0844  LATICACIDVEN 1.5    Recent Results (from the past 240 hours)  Resp panel by RT-PCR (RSV, Flu A&B, Covid) Anterior Nasal Swab     Status: None   Collection Time: 06/18/23  3:57 PM   Specimen: Anterior Nasal Swab  Result Value Ref Range Status   SARS Coronavirus 2 by RT PCR NEGATIVE NEGATIVE Final   Influenza A by PCR NEGATIVE NEGATIVE Final   Influenza B by PCR NEGATIVE NEGATIVE Final    Comment: (NOTE) The Xpert Xpress SARS-CoV-2/FLU/RSV plus assay is intended as an aid in the diagnosis of influenza from Nasopharyngeal swab specimens and should not be used as a sole basis for treatment. Nasal washings and aspirates are unacceptable for Xpert Xpress SARS-CoV-2/FLU/RSV testing.  Fact Sheet for Patients: BloggerCourse.com  Fact Sheet for Healthcare Providers: SeriousBroker.it  This test is not yet approved or cleared by the [place] FDA and has been authorized for detection and/or diagnosis of SARS-CoV-2 by FDA under an Emergency Use Authorization (EUA). This EUA will remain in effect (meaning this test can be used) for the duration of the COVID-19 declaration under Section 564(b)(1) of the Act, 21 U.S.C. section 360bbb-3(b)(1), unless the authorization is terminated or revoked.     Resp Syncytial Virus by PCR NEGATIVE NEGATIVE Final    Comment: (NOTE) Fact Sheet for Patients: BloggerCourse.com  Fact Sheet for Healthcare  Providers: SeriousBroker.it  This test is not yet approved or cleared by the [place] FDA and has been authorized for detection and/or diagnosis of SARS-CoV-2 by FDA under an Emergency Use Authorization (EUA). This EUA will remain in effect (meaning this test can be used) for the duration of the COVID-19 declaration under Section 564(b)(1) of the Act, 21 U.S.C. section 360bbb-3(b)(1), unless the authorization is terminated or revoked.  Performed at [facility], [address] [place], [place] [address]      Radiology Studies: No results found.   Scheduled Meds:  ARIPiprazole  2 mg Oral Daily   aspirin EC  81 mg Oral Daily   Chlorhexidine Gluconate Cloth  6 each Topical Q0600   enoxaparin (LOVENOX) injection  30 mg Subcutaneous Q24H   sodium chloride flush  3 mL Intravenous Q12H   Continuous Infusions:  sodium chloride 100 mL/hr at 06/22/23 0616   promethazine (PHENERGAN) injection (IM or IVPB)       LOS: 3 days   Hughie Closs, MD [facility]  06/22/2023, 8:11 AM   *Please note that this is a verbal dictation therefore any spelling or grammatical errors are due to the "Dragon Medical One" system interpretation.  Please page via Amion and do not message via secure chat for urgent patient care matters. Secure chat can be used for non urgent patient care matters.  How to contact the [facility] Attending or Consulting provider 7A - 7P or covering provider during after hours 7P -7A, for this patient?  Check the care team in [facility] and look for a) attending/consulting [facility] provider listed and b) the [facility] team listed. Page or secure chat 7A-7P. Log into [email or web address] and use [place]'s universal password to access. If you do not have the password, please contact the hospital operator. Locate the [facility] provider you are looking for under [facility] and page to a number that you can be directly reached. If you still have difficulty reaching  the provider, please page the [facility] (Director on Call) for the Hospitalists listed on amion for assistance.

## 2023-06-22 NOTE — Progress Notes (Signed)
 TRH night cross cover note:   I was notified by RN of the patient's request for an additional 0.5 mg of Xanax for sleep.   As an outpatient, she is reported to take Xanax 1 mg p.o. nightly for insomnia.  Currently, she has an order for Xanax 0.5 mg p.o. twice daily as needed.   I subsequently ordered Xanax 0.5 mg p.o. x 1 dose now.     Newton Pigg, DO Hospitalist

## 2023-06-22 NOTE — TOC Initial Note (Addendum)
 Transition of Care [facility]) - Initial/Assessment Note    Patient Details  Name: [name] [name] MRN: [id number] Date of Birth: Mar 30, 1951  [facility] CM/SW Contact:    Michaela Corner, LCSWA Phone Number: 06/22/2023, 11:20 AM  Clinical Narrative:     CSW met patient at bedside to discuss PT recs for SNF. Patient is agreeable to CSW faxing referrals and providing bed offers. Patient stated she would like to see if she is eligible for Medicaid. CSW will contact financial counseling. Per patient, she has approximately $1335 in SSI.   4:10 PM CSW provided patient with medicare.gov ratings for accepting bed offers for SNF. Patient stated she does not like any of SNF options and would rather go home. Patient stated she would rather have HH. CSW notfied RNCM.   TOC will continue to follow.     Expected Discharge Plan: Skilled Nursing Facility Barriers to Discharge: Continued Medical Work up, SNF Pending bed offer, Insurance Authorization   Patient Goals and CMS Choice Patient states their goals for this hospitalization and ongoing recovery are:: To get better          Expected Discharge Plan and Services In-house Referral: Clinical Social Work     Living arrangements for the past 2 months: Single Family Home                                      Prior Living Arrangements/Services Living arrangements for the past 2 months: Single Family Home Lives with:: Self, Roommate Patient language and need for interpreter reviewed:: Yes Do you feel safe going back to the place where you live?: Yes      Need for Family Participation in Patient Care: No (Comment) Care giver support system in place?: No (comment)   Criminal Activity/Legal Involvement Pertinent to Current Situation/Hospitalization: No - Comment as needed  Activities of Daily Living   ADL Screening (condition at time of admission) Independently performs ADLs?: Yes (appropriate for developmental age) Is  the patient deaf or have difficulty hearing?: No Does the patient have difficulty seeing, even when wearing glasses/contacts?: No Does the patient have difficulty concentrating, remembering, or making decisions?: No  Permission Sought/Granted Permission sought to share information with : Facility Industrial/product designer granted to share information with : Yes, Verbal Permission Granted     Permission granted to share info w AGENCY: SNFs        Emotional Assessment Appearance:: Appears stated age Attitude/Demeanor/Rapport: Engaged Affect (typically observed): Pleasant Orientation: : Oriented to Situation, Oriented to  Time, Oriented to Place, Oriented to Self Alcohol / Substance Use: Not Applicable Psych Involvement: No (comment)  Admission diagnosis:  Acute respiratory failure with hypoxia (HCC) [J96.01] Acute on chronic congestive heart failure, unspecified heart failure type (HCC) [I50.9] Patient Active Problem List   Diagnosis Date Noted   Acute on chronic congestive heart failure (HCC) 06/20/2023   Acute respiratory failure with hypoxia (HCC) 06/18/2023   Chronic kidney disease, stage 3a (HCC) 06/18/2023   Frequent falls 04/24/2022   Memory loss 04/24/2022   RA (rheumatoid arthritis) (HCC) 01/20/2022   Duodenal mass    Neck pain, chronic 03/09/2016   Chronic low back pain 03/09/2016   Coronary artery disease involving native coronary artery of native heart with angina pectoris (HCC)    COPD (chronic obstructive pulmonary disease) with chronic bronchitis (HCC) 10/12/2014   Premature ventricular contraction 07/16/2014   Thoracic  back pain 05/30/2014   Anxiety state 05/25/2014   Hyperlipemia, mixed 05/08/2007   Depression, major, single episode, moderate (HCC) 05/08/2007   Essential hypertension 05/08/2007   ALLERGIC RHINITIS 05/08/2007   GERD 05/08/2007   Osteoarthritis 05/08/2007   DUODENAL ULCER, HX OF 05/08/2007   CHICKENPOX, HX OF 05/08/2007   PCP:  [name], MD Pharmacy:   [id number] [place], [place] - [phone] [place] [address] Phone: [phone] Fax: [phone]     Social Drivers of Health (SDOH) Social History: SDOH Screenings   Food Insecurity: No Food Insecurity (06/19/2023)  Housing: Low Risk  (06/19/2023)  Transportation Needs: No Transportation Needs (06/19/2023)  Utilities: Not At Risk (06/19/2023)  Alcohol Screen: Low Risk  (05/14/2023)  Depression (PHQ2-9): Low Risk  (05/14/2023)  Recent Concern: Depression (PHQ2-9) - High Risk (05/14/2023)  Financial Resource Strain: Low Risk  (05/14/2023)  Physical Activity: Inactive (05/14/2023)  Social Connections: Socially Isolated (06/19/2023)  Stress: No Stress Concern Present (05/14/2023)  Tobacco Use: Medium Risk (06/18/2023)  Health Literacy: Adequate Health Literacy (05/14/2023)   SDOH Interventions:     Readmission Risk Interventions     No data to display

## 2023-06-22 NOTE — Progress Notes (Addendum)
 Patient Name: [name] [name] Date of Encounter: 06/22/2023 [facility] Cardiologist: Verne Carrow, MD   Interval Summary  .    Breathing is about the same, pending R/LHC today.   Vital Signs .    Vitals:   06/22/23 0420 06/22/23 0423 06/22/23 0500 06/22/23 0841  BP:  103/60 108/60 114/70  Pulse: 98 94 87 86  Resp: 16 (!) 23 19 (!) 22  Temp:  97.6 F (36.4 C)  98.3 F (36.8 C)  TempSrc:  Oral  Oral  SpO2: (!) 82% 93% 99% 93%  Weight:   90.3 kg   Height:        Intake/Output Summary (Last 24 hours) at 06/22/2023 1008 Last data filed at 06/22/2023 0601 Gross per 24 hour  Intake 2534.36 ml  Output 1450 ml  Net 1084.36 ml      06/22/2023    5:00 AM 06/21/2023    4:00 AM 06/20/2023    5:00 AM  Last 3 Weights  Weight (lbs) 199 lb 1.2 oz 191 lb 2.2 oz 191 lb 9.3 oz  Weight (kg) 90.3 kg 86.7 kg 86.9 kg      Telemetry/ECG    Sinus rhythm with PVCs - Personally Reviewed  Physical Exam .    GEN: No acute distress, [place] @  Neck: No JVD Cardiac: RRR, no murmurs, rubs, or gallops.  Respiratory: Clear to auscultation bilaterally. GI: Soft, nontender, non-distended  MS: No edema  Assessment & Plan .     Gerald Honea is a 73 y.o. female with a hx of CAD, HTN, HLD, CKD IIIa, COPD, OA who is being seen 06/20/2023 for the evaluation of CHF at the request of Dr. Jacqulyn Bath.   Acute hypoxic respiratory failure Suspected OSA Possible pulmonary HTN COPD -- presented with worsening dyspnea on exertion over the past couple of weeks. BNP 715, CT chest with small bilateral effusions -- s/p IV lasix, net - 2.4L, but developed hypotension yesterday requiring NS bolus -- echo 3/18 with LVEF of 60-65%, g1DD, mildly reduced RV with interventricular septum flattening consistent with RV pressure and volume overload -- for now, holding IV lasix. Receiving IVFs -- plan for [facility] today    Nonobstructive CAD -- cardiac cath 2017 with 50% mid LAD stenosis, mild disease  of RCA and circumflex with recommendations for medical management  -- denies any chest pain PTA -- echo showed LVEF of 60-65%, no rWMA -- hsTn 97>>104, EKG without ischemic changes  -- as above, planned for LHC    PVCs -- has been on flecainide 50mg  TID historically but with hx of CAD would favor stopping -- will plan to resume metoprolol pending stable BP    CKD IIIa with AKI -- baseline Cr around 1.2-1.3, up to 1.58>> 1.7>>1.5 -- as above, holding lasix and BP meds -- continue IVFs      For questions or updates, please contact [facility] Please consult [email or web address] for contact info under        Signed, Laverda Page, NP   ATTENDING ATTESTATION:  After conducting a review of all available clinical information with the care team, interviewing the patient, and performing a physical exam, I agree with the findings and plan described in this note.   GEN: No acute distress.   HEENT:  MMM, no JVD, no scleral icterus Cardiac: RRR, no murmurs, rubs, or gallops.  Respiratory: Decreased BS at bases GI: Soft, nontender, non-distended  MS: No edema; No deformity. Neuro:  Nonfocal  Vasc:  +  2 radial pulses  Patient unchanged versus yesterday.  Per my note yesterday, her biggest issue seems to be fatigue and dyspnea.  Dyspnea may be an anginal equivalent but it occurs at rest and with exertion (unclear if routinely).  I am particularly interested in her filling pressures as her TTE suggests RV overload so characterizing her pulmonary pressures and whether she has pulmonary venous vs pulmonary arterial hypertension will be important.  Will will pursue coronary angiography today.  Given her atypical symptoms, I think PCI should be pursued for lesions that may be deemed causative for her atypical symptoms (I.e. prox LAD, prox disease of large RCA or large Lcx).  If the invasive study is unrevealing, then looking at noncardiac etiologies will need to be pursued.  I have reviewed  the risks, indications, and alternatives to cardiac catheterization, possible angioplasty, and stenting with the patient. Risks include but are not limited to bleeding, infection, vascular injury, stroke, myocardial infection, arrhythmia, kidney injury, radiation-related injury in the case of prolonged fluoroscopy use, emergency cardiac surgery, and death. The patient understands the risks of serious complication is 1-2 in 1000 with diagnostic cardiac cath and 1-2% or less with angioplasty/stenting.    ADDENDUM:  Reviewed coronary angiography and RHC results.  Patient with wedge 20, PAP 2.5, and PVR 6 WU c/w with mixed pulmonary venous/arterial hypertension.  No significant CAD.  I have reached out to Dr. Teressa Lower who will coordinate outpatient AHF consultation for [facility].  For now, continue diuretics.    Alverda Skeans, MD Pager [phone]

## 2023-06-22 NOTE — NC FL2 (Signed)
 [place] MEDICAID [facility] LEVEL OF CARE FORM     IDENTIFICATION  Patient Name: [name] [name] Birthdate: Jan 05, 1951 Sex: female Admission Date (Current Location): 06/18/2023  [facility] and [place] Number:  Producer, television/film/video and Address:  [facility], [address] [place], [place] [address]      Provider Number: [id number]  Attending Physician Name and Address:  Hughie Closs, MD  Relative Name and Phone Number:       Current Level of Care: Hospital Recommended Level of Care: Skilled Nursing Facility Prior Approval Number:    Date Approved/Denied:   PASRR Number: [id number]  Discharge Plan: SNF    Current Diagnoses: Patient Active Problem List   Diagnosis Date Noted   Acute on chronic congestive heart failure (HCC) 06/20/2023   Acute respiratory failure with hypoxia (HCC) 06/18/2023   Chronic kidney disease, stage 3a (HCC) 06/18/2023   Frequent falls 04/24/2022   Memory loss 04/24/2022   RA (rheumatoid arthritis) (HCC) 01/20/2022   Duodenal mass    Neck pain, chronic 03/09/2016   Chronic low back pain 03/09/2016   Coronary artery disease involving native coronary artery of native heart with angina pectoris (HCC)    COPD (chronic obstructive pulmonary disease) with chronic bronchitis (HCC) 10/12/2014   Premature ventricular contraction 07/16/2014   Thoracic back pain 05/30/2014   Anxiety state 05/25/2014   Hyperlipemia, mixed 05/08/2007   Depression, major, single episode, moderate (HCC) 05/08/2007   Essential hypertension 05/08/2007   ALLERGIC RHINITIS 05/08/2007   GERD 05/08/2007   Osteoarthritis 05/08/2007   DUODENAL ULCER, HX OF 05/08/2007   CHICKENPOX, HX OF 05/08/2007    Orientation RESPIRATION BLADDER Height & Weight     Self, Time, Situation, Place  O2 (2L O2) Continent, Indwelling catheter Weight: 199 lb 1.2 oz (90.3 kg) Height:  5' (152.4 cm)  BEHAVIORAL SYMPTOMS/MOOD NEUROLOGICAL BOWEL NUTRITION STATUS      Continent  Diet (see dc summary)  AMBULATORY STATUS COMMUNICATION OF NEEDS Skin   Extensive Assist Verbally Normal                       Personal Care Assistance Level of Assistance  Bathing, Feeding, Dressing Bathing Assistance: Maximum assistance Feeding assistance: Limited assistance Dressing Assistance: Maximum assistance     Functional Limitations Info  Sight, Hearing, Speech Sight Info: Impaired (eyeglasses) Hearing Info: Adequate Speech Info: Adequate    SPECIAL CARE FACTORS FREQUENCY  PT (By licensed PT), OT (By licensed OT)     PT Frequency: 5x week OT Frequency: 5x week            Contractures Contractures Info: Not present    Additional Factors Info  Code Status, Allergies, Psychotropic Code Status Info: DNR Interven Allergies Info: Augmentin (Amoxicillin-pot Clavulanate), Clindamycin/lincomycin, Colchicine, Flecainide, Hydroxychloroquine Psychotropic Info: ABILIFY         Current Medications (06/22/2023):  This is the current hospital active medication list Current Facility-Administered Medications  Medication Dose Route Frequency Provider Last Rate Last Admin   0.9 %  sodium chloride infusion   Intravenous Continuous [place] Ouch, MD 100 mL/hr at 06/22/23 0616 Infusion Verify at 06/22/23 7846   acetaminophen (TYLENOL) tablet 650 mg  650 mg Oral Q6H PRN Charlsie Quest, MD       Or   acetaminophen (TYLENOL) suppository 650 mg  650 mg Rectal Q6H PRN Charlsie Quest, MD       ALPRAZolam Prudy Feeler) tablet 0.5 mg  0.5 mg Oral  BID PRN Hughie Closs, MD   0.5 mg at 06/21/23 2223   ARIPiprazole (ABILIFY) tablet 2 mg  2 mg Oral Daily Charlsie Quest, MD   2 mg at 06/22/23 1610   aspirin EC tablet 81 mg  81 mg Oral Daily Charlsie Quest, MD   81 mg at 06/22/23 9604   bisacodyl (DULCOLAX) EC tablet 5 mg  5 mg Oral Daily PRN Charlsie Quest, MD   5 mg at 06/22/23 5409   Chlorhexidine Gluconate Cloth 2 % PADS 6 each  6 each Topical Q0600 Hughie Closs, MD   6 each at  06/22/23 0930   enoxaparin (LOVENOX) injection 30 mg  30 mg Subcutaneous Q24H Silvana Newness, RPH   30 mg at 06/21/23 2215   ipratropium-albuterol (DUONEB) 0.5-2.5 (3) MG/3ML nebulizer solution 3 mL  3 mL Nebulization Q6H PRN Charlsie Quest, MD       melatonin tablet 3 mg  3 mg Oral QHS PRN Charlsie Quest, MD       promethazine (PHENERGAN) 12.5 mg in sodium chloride 0.9 % 50 mL IVPB  12.5 mg Intravenous Q6H PRN Pahwani, Daleen Bo, MD       senna-docusate (Senokot-S) tablet 1 tablet  1 tablet Oral QHS PRN Charlsie Quest, MD   1 tablet at 06/22/23 0837   sodium chloride flush (NS) 0.9 % injection 3 mL  3 mL Intravenous Q12H Charlsie Quest, MD   3 mL at 06/22/23 0930     Discharge Medications: Please see discharge summary for a list of discharge medications.  Relevant Imaging Results:  Relevant Lab Results:   Additional Information SSN [id number], [place]

## 2023-06-22 NOTE — Care Management Important Message (Signed)
 Important Message  Patient Details  Name: [name] [name] MRN: [id number] Date of Birth: [birth year]   Important Message Given:  Yes - Medicare IM     Renie Ora 06/22/2023, 9:44 AM

## 2023-06-22 NOTE — Plan of Care (Signed)
  Problem: Safety: Goal: Ability to remain free from injury will improve Outcome: Progressing   Problem: Cardiac: Goal: Ability to achieve and maintain adequate cardiopulmonary perfusion will improve Outcome: Progressing   

## 2023-06-23 DIAGNOSIS — J9601 Acute respiratory failure with hypoxia: Secondary | ICD-10-CM | POA: Diagnosis not present

## 2023-06-23 LAB — BASIC METABOLIC PANEL
Anion gap: 6 (ref 5–15)
BUN: 30 mg/dL — ABNORMAL HIGH (ref 8–23)
CO2: 27 mmol/L (ref 22–32)
Calcium: 8 mg/dL — ABNORMAL LOW (ref 8.9–10.3)
Chloride: 106 mmol/L (ref 98–111)
Creatinine, Ser: 1.18 mg/dL — ABNORMAL HIGH (ref 0.44–1.00)
GFR, Estimated: 48 mL/min — ABNORMAL LOW (ref >60)
Glucose, Bld: 103 mg/dL — ABNORMAL HIGH (ref 70–99)
Potassium: 4.5 mmol/L (ref 3.5–5.1)
Sodium: 139 mmol/L (ref 135–145)

## 2023-06-23 LAB — GLUCOSE, CAPILLARY
Glucose-Capillary: 88 mg/dL (ref 70–99)
Glucose-Capillary: 101 mg/dL — ABNORMAL HIGH (ref 70–99)

## 2023-06-23 NOTE — Plan of Care (Signed)
  Problem: Clinical Measurements: Goal: Cardiovascular complication will be avoided Outcome: Progressing   Problem: Activity: Goal: Risk for activity intolerance will decrease Outcome: Progressing   Problem: Safety: Goal: Ability to remain free from injury will improve Outcome: Progressing   

## 2023-06-23 NOTE — Progress Notes (Signed)
   Rounding Note    Patient Name: [name] [name] Date of Encounter: 06/23/2023  [facility] Cardiologist: Verne Carrow, MD   Subjective   LHC/RHC yesterday. No significant stenoses. She reports stable dyspnea.  Vital Signs    Vitals:   06/22/23 2000 06/23/23 0014 06/23/23 0400 06/23/23 0809  BP: 122/61 122/68 (!) 108/53 (!) 104/59  Pulse: 64 95 87 86  Resp: 14 17 18 18   Temp: 98.3 F (36.8 C) 98.3 F (36.8 C) 98.3 F (36.8 C) 98.4 F (36.9 C)  TempSrc: Oral Oral Oral Oral  SpO2: 100% 96% 93% 96%  Weight:   90.2 kg   Height:        Intake/Output Summary (Last 24 hours) at 06/23/2023 0951 Last data filed at 06/23/2023 0442 Gross per 24 hour  Intake 1555.65 ml  Output 1000 ml  Net 555.65 ml      06/23/2023    4:00 AM 06/22/2023    5:00 AM 06/21/2023    4:00 AM  Last 3 Weights  Weight (lbs) 198 lb 13.7 oz 199 lb 1.2 oz 191 lb 2.2 oz  Weight (kg) 90.2 kg 90.3 kg 86.7 kg      Telemetry    Personally Reviewed  ECG    Personally Reviewed    LHC/RHC 06/22/2023 1.  Patent left main with no significant stenosis 2.  Patent LAD with diffuse calcification and moderate mid vessel stenosis of 50 to 60% 3.  Patent circumflex with no significant stenosis 4.  Large, dominant RCA with diffuse calcification but no significant stenosis 5.  Normal LVEDP 6.  Moderate pulmonary hypertension with PA pressure 65/33 mean 45 mmHg, transpulmonary gradient 25 mmHg, PVR 6 Wood units 7.  Preserved cardiac output of 4 L/min with a cardiac index of 2.2   Recommend: medical therapy, no indication for PCI, further evaluation/treatment of pulmonary HTN per inpatient team     Physical Exam    GEN: No acute distress.  On supplemental oxygen Cardiac: RRR, no murmurs, rubs, or gallops.  Respiratory: Clear to auscultation bilaterally. Psych: Normal affect   Assessment & Plan    #Acute hypoxic resp failure #Pulmonary HTN Improving. EF normal  #CAD No  significant stenoses on LHC this admission.  #PVCs Cont metoprolol  #CKD3a Holding lasix for now     [place] T. Lalla Brothers, MD, [facility], [facility] Cardiac Electrophysiology

## 2023-06-23 NOTE — Plan of Care (Signed)
  Problem: Health Behavior/Discharge Planning: Goal: Ability to manage health-related needs will improve Outcome: Progressing   Problem: Clinical Measurements: Goal: Ability to maintain clinical measurements within normal limits will improve Outcome: Progressing   Problem: Clinical Measurements: Goal: Diagnostic test results will improve Outcome: Progressing   Problem: Clinical Measurements: Goal: Respiratory complications will improve Outcome: Progressing   Problem: Clinical Measurements: Goal: Cardiovascular complication will be avoided Outcome: Progressing   Problem: Activity: Goal: Risk for activity intolerance will decrease Outcome: Progressing

## 2023-06-23 NOTE — Progress Notes (Signed)
 PROGRESS NOTE    [name] [name]  GEX:[id number] DOB: 1950-12-19 DOA: 06/18/2023 PCP: [name], MD   Brief Narrative:  [name] [name] is a 73 y.o. 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  She denies any worsening cough.  Dyspnea was mostly exertional.   She has noticed some increased swelling in both her legs.   She reports good urine output.   Patient states that her significant other recently passed away 2 weeks ago.  She is living at home with a new roommate now.  She states that she quit smoking years ago.   Due to increased work of breathing patient was placed on BiPAP afterwards.  COVID, flu and RSV negative.Portable chest x-ray showed bilateral lower lobe airspace opacities. CTA chest ruled out PE.  BNP slightly elevated around 700 but no previous baseline known. Patient was given IV Lasix 40 mg.  Admitted to hospital service.  Eventually cardiology consulted.  Details below.  Assessment & Plan:   Principal Problem:   Acute respiratory failure with hypoxia (HCC) Active Problems:   Hyperlipemia, mixed   Depression, major, single episode, moderate (HCC)   Essential hypertension   COPD (chronic obstructive pulmonary disease) with chronic bronchitis (HCC)   Coronary artery disease involving native coronary artery of native heart with angina pectoris (HCC)   Chronic kidney disease, stage 3a (HCC)   Acute on chronic congestive heart failure (HCC)  Acute respiratory failure with hypoxia secondary to acute on chronic congestive heart failure with preserved ejection fraction: Last echo completed in 2020 shows normal ejection fraction and grade 1 diastolic dysfunction.  Upon arrival, patient was hypoxic, requiring BiPAP and eventually weaned to 4 L of oxygen.  Although chest x-ray shows possibility of  bilateral lower lobe airspace opacities however patient with no fever and no leukocytosis as well as chronic fever, does not appear to have pneumonia clinically.  CTA negative for PE, scattered atelectasis and known scattered pulmonary nodules with no significant change from 2022.  Given stability over time, 1 year follow-up chest CT is recommended for high-risk patients. BNP elevated at 715 however no prior baseline known, she was thought to be having CHF exacerbation, started on IV Lasix 40 mg twice daily.  Echo shows right heart failure with normal ejection fraction and grade 1 diastolic dysfunction and right heart failure, cardiology consulted, they were planning to do right and left heart cath but due to hypotension, this was canceled and was rescheduled for 06/21/2023, however due to rising creatinine, this was postponed for 06/22/2023, eventually underwent cardiac cath which showed diffuse CAD but no target for PCI.  Medical management recommended.  Patient's respiratory status is getting better, she is down to 1 to 2 L of oxygen requirement.  Encouraged incentive spirometry, nurse is aware to remind patient and wean oxygen as able to.    COPD: No wheezing.  This is appears to be stable.  Continue bronchodilators.  Urinary retention: Patient having recurrent urinary retention, required straight cath x 3 so far and eventually indwelling Foley catheter was placed 06/21/2023.  Will remove Foley catheter once she is more mobile.  CAD/elevated troponin: Denies chest pain.  Troponin elevated slightly around 100 but stable, does not indicate ACS.  Echo ruled out WMA.  History of frequent symptomatic PVCs: Continue flecainide and Toprol-XL.   Hypertension/now hypotensive:  Due to recurrent hypotension requiring IV fluid support, Toprol-XL, amlodipine, lisinopril as well as IV Lasix were held.  She received small fluid boluses followed by continuous normal saline started by cardiology day before yesterday  and I realized that she is still getting normal saline 100 cc/h, blood pressure is better, creatinine improving so I do not think she needs more IV fluid so I will discontinue that for now and wait for cardiology evaluation.   AKI on CKD stage IIIb: Baseline creatinine between 1.1-1.5.  Upon arrival, creatinine was at baseline however it peaked at 1.72 and improved to 1.08 today.  Avoid nephrotoxic agents.  Repeat labs in the morning.  Discontinue IV fluids.  Hyperlipidemia: Not currently on statin.   Depression/anxiety: Continue Abilify.  DVT prophylaxis: enoxaparin (LOVENOX) injection 40 mg Start: 06/23/23 1000   Code Status: Do not attempt resuscitation (DNR) PRE-ARREST INTERVENTIONS DESIRED  Family Communication: None present at bedside.  Plan of care discussed with patient in length and he/she verbalized understanding and agreed with it.   Status is: Inpatient Remains inpatient appropriate because: Needs inpatient management.     Estimated body mass index is 38.84 kg/m as calculated from the following:   Height as of this encounter: 5' (1.524 m).   Weight as of this encounter: 90.2 kg.    Nutritional Assessment: Body mass index is 38.84 kg/m.Marland Kitchen Seen by dietician.  I agree with the assessment and plan as outlined below: Nutrition Status:        . Skin Assessment: I have examined the patient's skin and I agree with the wound assessment as performed by the wound care RN as outlined below:    Consultants:  None  Procedures:  None  Antimicrobials:  Anti-infectives (From admission, onward)    None         Subjective: Patient seen and examined.  Yet again, when I asked her how she was feeling, she started staring at me and gave me a look as if she wanted to say the same thing that she said yesterday, " what actually question".  she had no complaints to offer.  Objective: Vitals:   06/22/23 1900 06/22/23 2000 06/23/23 0014 06/23/23 0400  BP: (!) 115/51  122/61 122/68 (!) 108/53  Pulse:  64 95 87  Resp: 20 14 17 18   Temp: 98.3 F (36.8 C) 98.3 F (36.8 C) 98.3 F (36.8 C) 98.3 F (36.8 C)  TempSrc: Oral Oral Oral Oral  SpO2: 93% 100% 96% 93%  Weight:    90.2 kg  Height:        Intake/Output Summary (Last 24 hours) at 06/23/2023 0752 Last data filed at 06/23/2023 0442 Gross per 24 hour  Intake 1555.65 ml  Output 1000 ml  Net 555.65 ml   Filed Weights   06/21/23 0400 06/22/23 0500 06/23/23 0400  Weight: 86.7 kg 90.3 kg 90.2 kg    Examination:  General exam: Appears calm and comfortable  Respiratory system: Faint bibasilar crackles. Respiratory effort normal. Cardiovascular system: S1 & S2 heard, RRR. No JVD, murmurs, rubs, gallops or clicks. No pedal edema. Gastrointestinal system: Abdomen is nondistended, soft and nontender. No organomegaly or masses felt. Normal bowel sounds heard. Central nervous system: Alert and oriented. No focal neurological deficits. Extremities: Symmetric 5 x 5 power. Skin: No rashes, lesions or ulcers.    Data Reviewed: I have personally reviewed following labs and imaging studies  CBC: Recent Labs  Lab 06/18/23 1648 06/19/23 0500 06/22/23 0305 06/22/23 1647  WBC 7.2 7.8  8.4  --   NEUTROABS 5.3  --  4.8  --   HGB 14.0 13.9 12.8 13.6  HCT 46.0 44.8 41.3 40.0  MCV 98.7 97.6 97.4  --   PLT 304 326 299  --    Basic Metabolic Panel: Recent Labs  Lab 06/19/23 0500 06/20/23 0246 06/21/23 0244 06/22/23 0305 06/22/23 1647 06/23/23 0246  NA 137 139 138 141 141 139  K 4.7 4.9 4.4 4.5 4.2 4.5  CL 100 97* 97* 104  --  106  CO2 29 35* 32 29  --  27  GLUCOSE 100* 108* 105* 125*  --  103*  BUN 23 36* 47* 50*  --  30*  CREATININE 1.25* 1.58* 1.72* 1.50*  --  1.18*  CALCIUM 8.6* 8.7* 8.6* 8.0*  --  8.0*  MG 2.0  --   --   --   --   --    GFR: Estimated Creatinine Clearance: 41.9 mL/min (A) (by C-G formula based on SCr of 1.18 mg/dL (H)). Liver Function Tests: Recent Labs  Lab  06/18/23 1648  AST 20  ALT 16  ALKPHOS 54  BILITOT 0.6  PROT 6.8  ALBUMIN 3.5   No results for input(s): "LIPASE", "AMYLASE" in the last 168 hours. No results for input(s): "AMMONIA" in the last 168 hours. Coagulation Profile: No results for input(s): "INR", "PROTIME" in the last 168 hours. Cardiac Enzymes: No results for input(s): "CKTOTAL", "CKMB", "CKMBINDEX", "TROPONINI" in the last 168 hours. BNP (last 3 results) No results for input(s): "PROBNP" in the last 8760 hours. HbA1C: No results for input(s): "HGBA1C" in the last 72 hours. CBG: No results for input(s): "GLUCAP" in the last 168 hours. Lipid Profile: No results for input(s): "CHOL", "HDL", "LDLCALC", "TRIG", "CHOLHDL", "LDLDIRECT" in the last 72 hours. Thyroid Function Tests: No results for input(s): "TSH", "T4TOTAL", "FREET4", "T3FREE", "THYROIDAB" in the last 72 hours. Anemia Panel: No results for input(s): "VITAMINB12", "FOLATE", "FERRITIN", "TIBC", "IRON", "RETICCTPCT" in the last 72 hours. Sepsis Labs: Recent Labs  Lab 06/20/23 0844  LATICACIDVEN 1.5    Recent Results (from the past 240 hours)  Resp panel by RT-PCR (RSV, Flu A&B, Covid) Anterior Nasal Swab     Status: None   Collection Time: 06/18/23  3:57 PM   Specimen: Anterior Nasal Swab  Result Value Ref Range Status   SARS Coronavirus 2 by RT PCR NEGATIVE NEGATIVE Final   Influenza A by PCR NEGATIVE NEGATIVE Final   Influenza B by PCR NEGATIVE NEGATIVE Final    Comment: (NOTE) The Xpert Xpress SARS-CoV-2/FLU/RSV plus assay is intended as an aid in the diagnosis of influenza from Nasopharyngeal swab specimens and should not be used as a sole basis for treatment. Nasal washings and aspirates are unacceptable for Xpert Xpress SARS-CoV-2/FLU/RSV testing.  Fact Sheet for Patients: BloggerCourse.com  Fact Sheet for Healthcare Providers: SeriousBroker.it  This test is not yet approved or cleared by  the [place] FDA and has been authorized for detection and/or diagnosis of SARS-CoV-2 by FDA under an Emergency Use Authorization (EUA). This EUA will remain in effect (meaning this test can be used) for the duration of the COVID-19 declaration under Section 564(b)(1) of the Act, 21 U.S.C. section 360bbb-3(b)(1), unless the authorization is terminated or revoked.     Resp Syncytial Virus by PCR NEGATIVE NEGATIVE Final    Comment: (NOTE) Fact Sheet for Patients: BloggerCourse.com  Fact Sheet for Healthcare Providers: SeriousBroker.it  This test is not yet approved or cleared by the [place]  States FDA and has been authorized for detection and/or diagnosis of SARS-CoV-2 by FDA under an Emergency Use Authorization (EUA). This EUA will remain in effect (meaning this test can be used) for the duration of the COVID-19 declaration under Section 564(b)(1) of the Act, 21 U.S.C. section 360bbb-3(b)(1), unless the authorization is terminated or revoked.  Performed at [facility], [address] [place], [place] [address]      Radiology Studies: CARDIAC CATHETERIZATION Result Date: 06/22/2023 1.  Patent left main with no significant stenosis 2.  Patent LAD with diffuse calcification and moderate mid vessel stenosis of 50 to 60% 3.  Patent circumflex with no significant stenosis 4.  Large, dominant RCA with diffuse calcification but no significant stenosis 5.  Normal LVEDP 6.  Moderate pulmonary hypertension with PA pressure 65/33 mean 45 mmHg, transpulmonary gradient 25 mmHg, PVR 6 Wood units 7.  Preserved cardiac output of 4 L/min with a cardiac index of 2.2 Recommend: medical therapy, no indication for PCI, further evaluation/treatment of pulmonary HTN per inpatient team     Scheduled Meds:  ARIPiprazole  2 mg Oral Daily   aspirin EC  81 mg Oral Daily   Chlorhexidine Gluconate Cloth  6 each Topical Q0600   enoxaparin (LOVENOX)  injection  40 mg Subcutaneous Q24H   sodium chloride flush  3 mL Intravenous Q12H   sodium chloride flush  3 mL Intravenous Q12H   Continuous Infusions:  sodium chloride 100 mL/hr at 06/22/23 2355   sodium chloride     promethazine (PHENERGAN) injection (IM or IVPB)       LOS: 4 days   Hughie Closs, MD [facility]  06/23/2023, 7:52 AM   *Please note that this is a verbal dictation therefore any spelling or grammatical errors are due to the "Dragon Medical One" system interpretation.  Please page via Amion and do not message via secure chat for urgent patient care matters. Secure chat can be used for non urgent patient care matters.  How to contact the [facility] Attending or Consulting provider 7A - 7P or covering provider during after hours 7P -7A, for this patient?  Check the care team in [facility] and look for a) attending/consulting [facility] provider listed and b) the [facility] team listed. Page or secure chat 7A-7P. Log into [email or web address] and use 's universal password to access. If you do not have the password, please contact the hospital operator. Locate the [facility] provider you are looking for under [facility] and page to a number that you can be directly reached. If you still have difficulty reaching the provider, please page the [facility] (Director on Call) for the Hospitalists listed on amion for assistance.

## 2023-06-24 DIAGNOSIS — J9601 Acute respiratory failure with hypoxia: Secondary | ICD-10-CM | POA: Diagnosis not present

## 2023-06-24 MED ORDER — BISACODYL 10 MG RE SUPP
10.0000 mg | Freq: Once | RECTAL | Status: AC
  Administered 2023-06-24: 10 mg via RECTAL
  Filled 2023-06-24: qty 1

## 2023-06-24 MED ORDER — FUROSEMIDE 20 MG PO TABS
20.0000 mg | ORAL_TABLET | Freq: Every day | ORAL | Status: DC
Start: 2023-06-24 — End: 2023-06-26
  Administered 2023-06-24 – 2023-06-26 (×3): 20 mg via ORAL
  Filled 2023-06-24 (×3): qty 1

## 2023-06-24 MED ORDER — SENNOSIDES-DOCUSATE SODIUM 8.6-50 MG PO TABS
1.0000 | ORAL_TABLET | Freq: Two times a day (BID) | ORAL | Status: DC
Start: 2023-06-24 — End: 2023-07-06
  Administered 2023-06-24 – 2023-07-06 (×20): 1 via ORAL
  Filled 2023-06-24 (×23): qty 1

## 2023-06-24 MED ORDER — POLYETHYLENE GLYCOL 3350 17 G PO PACK
17.0000 g | PACK | Freq: Every day | ORAL | Status: DC
  Administered 2023-06-24 – 2023-07-06 (×6): 17 g via ORAL
  Filled 2023-06-24 (×13): qty 1

## 2023-06-24 NOTE — Progress Notes (Addendum)
   Rounding Note    Patient Name: [name] [name] Date of Encounter: 06/24/2023  [facility] Cardiologist: Verne Carrow, MD   Subjective   Stable dyspnea. No CP.  Vital Signs    Vitals:   06/24/23 0400 06/24/23 0421 06/24/23 0428 06/24/23 0745  BP: 110/60 116/63  (!) 115/59  Pulse: 87 88  88  Resp: 19 20  20   Temp:  98.1 F (36.7 C)  97.8 F (36.6 C)  TempSrc:  Oral  Oral  SpO2: 95% 95%  96%  Weight:   89.4 kg   Height:        Intake/Output Summary (Last 24 hours) at 06/24/2023 0923 Last data filed at 06/24/2023 6045 Gross per 24 hour  Intake 960 ml  Output 900 ml  Net 60 ml      06/24/2023    4:28 AM 06/23/2023    4:00 AM 06/22/2023    5:00 AM  Last 3 Weights  Weight (lbs) 197 lb 1.5 oz 198 lb 13.7 oz 199 lb 1.2 oz  Weight (kg) 89.4 kg 90.2 kg 90.3 kg      Telemetry    Personally Reviewed  ECG    Personally Reviewed    LHC/RHC 06/22/2023 1.  Patent left main with no significant stenosis 2.  Patent LAD with diffuse calcification and moderate mid vessel stenosis of 50 to 60% 3.  Patent circumflex with no significant stenosis 4.  Large, dominant RCA with diffuse calcification but no significant stenosis 5.  Normal LVEDP 6.  Moderate pulmonary hypertension with PA pressure 65/33 mean 45 mmHg, transpulmonary gradient 25 mmHg, PVR 6 Wood units 7.  Preserved cardiac output of 4 L/min with a cardiac index of 2.2   Recommend: medical therapy, no indication for PCI, further evaluation/treatment of pulmonary HTN per inpatient team     Physical Exam    GEN: No acute distress.  On supplemental oxygen Cardiac: RRR, no murmurs, rubs, or gallops.  Respiratory: Clear to auscultation bilaterally. Psych: Normal affect   Assessment & Plan    #Acute hypoxic resp failure #Pulmonary HTN Improving. EF normal  #CAD No significant stenoses on LHC this admission.  #PVCs Cont metoprolol  #CKD3a Holding lasix for now  Cardiology will  sign off. Please call with questions or concerns.   Sheria Lang T. Lalla Brothers, MD, [facility], [facility] Cardiac Electrophysiology    ------------------------  ADDENDUM 11:03 AM  IM has requested Cardiology stay on board while inpatient. Adding lasix 20mg  PO daily to her medications.  Sheria Lang T. Lalla Brothers, MD, [facility], [facility] Cardiac Electrophysiology

## 2023-06-24 NOTE — Progress Notes (Signed)
 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  On presentation ,she was noted to be tachypneic with increased work of breathing, had to put on BiPAP.  COVID/flu/RSV negative.  Chest lower lobe bilateral lower lobe airspace opacities.  CTA chest ruled out PE.  BNP was elevated.  Patient was admitted for the management of acute on chronic HFpEF.  Cardiology was consulted and following.  PT /OT  recommending SNF on discharge.  Assessment & Plan:  Principal Problem:   Acute respiratory failure with hypoxia (HCC) Active Problems:   Hyperlipemia, mixed   Depression, major, single episode, moderate (HCC)   Essential hypertension   COPD (chronic obstructive pulmonary disease) with chronic bronchitis (HCC)   Coronary artery disease involving native coronary artery of native heart with angina pectoris (HCC)   Chronic kidney disease, stage 3a (HCC)   Acute on chronic congestive heart failure (HCC)   Acute hypoxic respiratory failure secondary to CHF exacerbation:   Hypoxic on arrival, had to put on BiPAP, now weaned to room air.  Continue to wean the oxygen.  CTA negative for PE.  Chest x-ray showed lower lobe opacities likely from pulm edema.  Continue bronchodilators as needed, incentive spirometer.  On 2 L of oxygen this morning.  Acute on chronic HFpEF/pulmonary hypertension: Presented with bilateral lower extremity edema, dyspnea on exertion.elevated BNP.  Started on IV Lasix.  Echocardiogram done here showed preserved EF, grade 1 diastolic dysfunction, pulmonary hypertension.  Cardiology did cardiac cath which  showed diffuse coronary disease but no target for PCI.  Current plan is for medical management.  Also found to have moderate pulmonary hypertension.  She should follow-up with cardiology as an outpatient for management of pulmonary hypertension.  COPD: Currently without wheezing.  Continue bronchodilators as needed  Coronary artery disease/elevated troponin: Declines any chest pain at present.  Troponins mildly elevated.  Echo did not show any wall motion abnormality. cardiac cath which showed diffuse coronary disease but no target for PCI.  Current plan is for medical management.  Pulmonary nodules: Seen on CT.  Follow-up recommended with CT chest in a year  History of frequent symptomatic PVCs: On flecainide, Toprol at home.  On hold due to bradycardia.  Can be resumed as per cardiology  History of hypertension: Hospital course remarkable for hypotension.  She was on metoprolol, amlodipine, lisinopril at home.these meds on hold due to hypotension, given IV fluids. Currently blood pressure stable.  AKI on CKD stage IIIb: Baseline creatinine around 1.1-1.5.  Currently kidney function at baseline.  Hyperlipidemia: Currently not on statin  Depression/anxiety: On Abilify  Debility/deconditioning: Patient seen by PT/OT and recommended SNF on discharge.  TOC following        DVT prophylaxis:enoxaparin (LOVENOX) injection 40 mg Start: 06/23/23 1000     Code Status: Do not attempt resuscitation (DNR) PRE-ARREST INTERVENTIONS DESIRED  Family Communication: None at the bedside  Patient status:Inpatient  Patient is from :Home  Anticipated discharge to:SNF  Estimated DC date:1-2 days   Consultants: Cardiology  Procedures:Cardiac cath  Antimicrobials:  Anti-infectives (From admission, onward)    None       Subjective: Patient seen and examined at bedside today.  Hemodynamically stable.  Lying in bed.  She was sleeping when I arrived.  She looked overall comfortable.   Complains of generalized weakness.  Has bilateral lower extremity nonpitting edema.  Has few crackles in bilateral bases.  Does not complain of shortness of breath or chest pain.  Objective: Vitals:   06/24/23 0400 06/24/23 0421 06/24/23 0428 06/24/23 0745  BP: 110/60 116/63  (!) 115/59  Pulse: 87 88  88  Resp: 19 20  20   Temp:  98.1 F (36.7 C)  97.8 F (36.6 C)  TempSrc:  Oral  Oral  SpO2: 95% 95%  96%  Weight:   89.4 kg   Height:        Intake/Output Summary (Last 24 hours) at 06/24/2023 0748 Last data filed at 06/24/2023 0500 Gross per 24 hour  Intake 840 ml  Output 900 ml  Net -60 ml   Filed Weights   06/22/23 0500 06/23/23 0400 06/24/23 0428  Weight: 90.3 kg 90.2 kg 89.4 kg    Examination:  General exam: Overall comfortable, not in distress HEENT: PERRL Respiratory system: Few bilateral basal crackles Cardiovascular system: S1 & S2 heard, RRR.  Gastrointestinal system: Abdomen is nondistended, soft and nontender. Central nervous system: Alert and oriented Extremities: Bilateral lower extremity nonpitting edema, no clubbing ,no cyanosis Skin: No rashes, no ulcers,no icterus     Data Reviewed: I have personally reviewed following labs and imaging studies  CBC: Recent Labs  Lab 06/18/23 1648 06/19/23 0500 06/22/23 0305 06/22/23 1647  WBC 7.2 7.8 8.4  --   NEUTROABS 5.3  --  4.8  --   HGB 14.0 13.9 12.8 13.6  HCT 46.0 44.8 41.3 40.0  MCV 98.7 97.6 97.4  --   PLT 304 326 299  --    Basic Metabolic Panel: Recent Labs  Lab 06/19/23 0500 06/20/23 0246 06/21/23 0244 06/22/23 0305 06/22/23 1647 06/23/23 0246  NA 137 139 138 141 141 139  K 4.7 4.9 4.4 4.5 4.2 4.5  CL 100 97* 97* 104  --  106  CO2 29 35* 32 29  --  27  GLUCOSE 100* 108* 105* 125*  --  103*  BUN 23 36* 47* 50*  --  30*  CREATININE 1.25* 1.58* 1.72* 1.50*  --  1.18*  CALCIUM 8.6* 8.7* 8.6* 8.0*  --  8.0*  MG 2.0  --   --   --   --   --      Recent Results (from the past 240 hours)   Resp panel by RT-PCR (RSV, Flu A&B, Covid) Anterior Nasal Swab     Status: None   Collection Time: 06/18/23  3:57 PM   Specimen: Anterior Nasal Swab  Result Value Ref Range Status   SARS Coronavirus 2 by RT PCR NEGATIVE NEGATIVE Final   Influenza A by PCR NEGATIVE NEGATIVE Final   Influenza B by PCR NEGATIVE NEGATIVE Final    Comment: (NOTE) The Xpert Xpress SARS-CoV-2/FLU/RSV plus assay is intended as an aid in the diagnosis of influenza from Nasopharyngeal swab specimens and should not be used as a sole basis for treatment. Nasal washings and aspirates are unacceptable for Xpert Xpress SARS-CoV-2/FLU/RSV testing.  Fact Sheet for Patients: BloggerCourse.com  Fact Sheet for Healthcare Providers: SeriousBroker.it  This test is not yet approved or cleared by the [place] FDA and has been authorized for detection and/or diagnosis of SARS-CoV-2 by FDA under an Emergency Use Authorization (EUA). This EUA will remain in effect (meaning this test can be used) for  the duration of the COVID-19 declaration under Section 564(b)(1) of the Act, 21 U.S.C. section 360bbb-3(b)(1), unless the authorization is terminated or revoked.     Resp Syncytial Virus by PCR NEGATIVE NEGATIVE Final    Comment: (NOTE) Fact Sheet for Patients: BloggerCourse.com  Fact Sheet for Healthcare Providers: SeriousBroker.it  This test is not yet approved or cleared by the [place] FDA and has been authorized for detection and/or diagnosis of SARS-CoV-2 by FDA under an Emergency Use Authorization (EUA). This EUA will remain in effect (meaning this test can be used) for the duration of the COVID-19 declaration under Section 564(b)(1) of the Act, 21 U.S.C. section 360bbb-3(b)(1), unless the authorization is terminated or revoked.  Performed at [facility], [address] [place],  [place] [address]      Radiology Studies: CARDIAC CATHETERIZATION Result Date: 06/22/2023 1.  Patent left main with no significant stenosis 2.  Patent LAD with diffuse calcification and moderate mid vessel stenosis of 50 to 60% 3.  Patent circumflex with no significant stenosis 4.  Large, dominant RCA with diffuse calcification but no significant stenosis 5.  Normal LVEDP 6.  Moderate pulmonary hypertension with PA pressure 65/33 mean 45 mmHg, transpulmonary gradient 25 mmHg, PVR 6 Wood units 7.  Preserved cardiac output of 4 L/min with a cardiac index of 2.2 Recommend: medical therapy, no indication for PCI, further evaluation/treatment of pulmonary HTN per inpatient team    Scheduled Meds:  ARIPiprazole  2 mg Oral Daily   aspirin EC  81 mg Oral Daily   Chlorhexidine Gluconate Cloth  6 each Topical Q0600   enoxaparin (LOVENOX) injection  40 mg Subcutaneous Q24H   sodium chloride flush  3 mL Intravenous Q12H   sodium chloride flush  3 mL Intravenous Q12H   Continuous Infusions:  promethazine (PHENERGAN) injection (IM or IVPB)       LOS: 5 days   Burnadette Pop, MD [facility] P3/23/2025, 7:48 AM  PROGRESS NOTE  [name] [name]  [id number]:[id number] DOB: 03-01-1951 DOA: 06/18/2023 PCP: [name], MD   Brief Narrative:   Assessment & Plan:  Principal Problem:   Acute respiratory failure with hypoxia (HCC) Active Problems:   Hyperlipemia, mixed   Depression, major, single episode, moderate (HCC)   Essential hypertension   COPD (chronic obstructive pulmonary disease) with chronic bronchitis (HCC)   Coronary artery disease involving native coronary artery of native heart with angina pectoris (HCC)   Chronic kidney disease, stage 3a (HCC)   Acute on chronic congestive heart failure (HCC)           DVT prophylaxis:enoxaparin (LOVENOX) injection 40 mg Start: 06/23/23 1000     Code Status: Do not attempt resuscitation (DNR) PRE-ARREST INTERVENTIONS DESIRED  Family  Communication:   Patient status:  Patient is from :  Anticipated discharge to:  Estimated DC date:   Consultants:   Procedures:  Antimicrobials:  Anti-infectives (From admission, onward)    None       Subjective:   Objective: Vitals:   06/24/23 0400 06/24/23 0421 06/24/23 0428 06/24/23 0745  BP: 110/60 116/63  (!) 115/59  Pulse: 87 88  88  Resp: 19 20  20   Temp:  98.1 F (36.7 C)  97.8 F (36.6 C)  TempSrc:  Oral  Oral  SpO2: 95% 95%  96%  Weight:   89.4 kg   Height:        Intake/Output Summary (Last 24 hours) at 06/24/2023 0748 Last data filed at 06/24/2023  0500 Gross per 24 hour  Intake 840 ml  Output 900 ml  Net -60 ml   Filed Weights   06/22/23 0500 06/23/23 0400 06/24/23 0428  Weight: 90.3 kg 90.2 kg 89.4 kg    Examination:  General exam: Overall comfortable, not in distress HEENT: PERRL Respiratory system:  no wheezes or crackles  Cardiovascular system: S1 & S2 heard, RRR.  Gastrointestinal system: Abdomen is nondistended, soft and nontender. Central nervous system: Alert and oriented Extremities: No edema, no clubbing ,no cyanosis Skin: No rashes, no ulcers,no icterus     Data Reviewed: I have personally reviewed following labs and imaging studies  CBC: Recent Labs  Lab 06/18/23 1648 06/19/23 0500 06/22/23 0305 06/22/23 1647  WBC 7.2 7.8 8.4  --   NEUTROABS 5.3  --  4.8  --   HGB 14.0 13.9 12.8 13.6  HCT 46.0 44.8 41.3 40.0  MCV 98.7 97.6 97.4  --   PLT 304 326 299  --    Basic Metabolic Panel: Recent Labs  Lab 06/19/23 0500 06/20/23 0246 06/21/23 0244 06/22/23 0305 06/22/23 1647 06/23/23 0246  NA 137 139 138 141 141 139  K 4.7 4.9 4.4 4.5 4.2 4.5  CL 100 97* 97* 104  --  106  CO2 29 35* 32 29  --  27  GLUCOSE 100* 108* 105* 125*  --  103*  BUN 23 36* 47* 50*  --  30*  CREATININE 1.25* 1.58* 1.72* 1.50*  --  1.18*  CALCIUM 8.6* 8.7* 8.6* 8.0*  --  8.0*  MG 2.0  --   --   --   --   --      Recent Results (from  the past 240 hours)  Resp panel by RT-PCR (RSV, Flu A&B, Covid) Anterior Nasal Swab     Status: None   Collection Time: 06/18/23  3:57 PM   Specimen: Anterior Nasal Swab  Result Value Ref Range Status   SARS Coronavirus 2 by RT PCR NEGATIVE NEGATIVE Final   Influenza A by PCR NEGATIVE NEGATIVE Final   Influenza B by PCR NEGATIVE NEGATIVE Final    Comment: (NOTE) The Xpert Xpress SARS-CoV-2/FLU/RSV plus assay is intended as an aid in the diagnosis of influenza from Nasopharyngeal swab specimens and should not be used as a sole basis for treatment. Nasal washings and aspirates are unacceptable for Xpert Xpress SARS-CoV-2/FLU/RSV testing.  Fact Sheet for Patients: BloggerCourse.com  Fact Sheet for Healthcare Providers: SeriousBroker.it  This test is not yet approved or cleared by the [place] FDA and has been authorized for detection and/or diagnosis of SARS-CoV-2 by FDA under an Emergency Use Authorization (EUA). This EUA will remain in effect (meaning this test can be used) for the duration of the COVID-19 declaration under Section 564(b)(1) of the Act, 21 U.S.C. section 360bbb-3(b)(1), unless the authorization is terminated or revoked.     Resp Syncytial Virus by PCR NEGATIVE NEGATIVE Final    Comment: (NOTE) Fact Sheet for Patients: BloggerCourse.com  Fact Sheet for Healthcare Providers: SeriousBroker.it  This test is not yet approved or cleared by the [place] FDA and has been authorized for detection and/or diagnosis of SARS-CoV-2 by FDA under an Emergency Use Authorization (EUA). This EUA will remain in effect (meaning this test can be used) for the duration of the COVID-19 declaration under Section 564(b)(1) of the Act, 21 U.S.C. section 360bbb-3(b)(1), unless the authorization is terminated or revoked.  Performed at [facility], [address]  [place], [place] [address]      Radiology Studies: CARDIAC CATHETERIZATION Result Date: 06/22/2023 1.  Patent left main with no significant stenosis 2.  Patent LAD with diffuse calcification and moderate mid vessel stenosis of 50 to 60% 3.  Patent circumflex with no significant stenosis 4.  Large, dominant RCA with diffuse calcification but no significant stenosis 5.  Normal LVEDP 6.  Moderate pulmonary hypertension with PA pressure 65/33 mean 45 mmHg, transpulmonary gradient 25 mmHg, PVR 6 Wood units 7.  Preserved cardiac output of 4 L/min with a cardiac index of 2.2 Recommend: medical therapy, no indication for PCI, further evaluation/treatment of pulmonary HTN per inpatient team    Scheduled Meds:  ARIPiprazole  2 mg Oral Daily   aspirin EC  81 mg Oral Daily   Chlorhexidine Gluconate Cloth  6 each Topical Q0600   enoxaparin (LOVENOX) injection  40 mg Subcutaneous Q24H   sodium chloride flush  3 mL Intravenous Q12H   sodium chloride flush  3 mL Intravenous Q12H   Continuous Infusions:  promethazine (PHENERGAN) injection (IM or IVPB)       LOS: 5 days   Burnadette Pop, MD [facility] P3/23/2025, 7:48 AM  PROGRESS NOTE  [name] [name]  UEA:[id number] DOB: [birth year] DOA: 06/18/2023 PCP: [name], MD   Brief Narrative:   Assessment & Plan:  Principal Problem:   Acute respiratory failure with hypoxia (HCC) Active Problems:   Hyperlipemia, mixed   Depression, major, single episode, moderate (HCC)   Essential hypertension   COPD (chronic obstructive pulmonary disease) with chronic bronchitis (HCC)   Coronary artery disease involving native coronary artery of native heart with angina pectoris (HCC)   Chronic kidney disease, stage 3a (HCC)   Acute on chronic congestive heart failure (HCC)           DVT prophylaxis:enoxaparin (LOVENOX) injection 40 mg Start: 06/23/23 1000     Code Status: Do not attempt resuscitation (DNR) PRE-ARREST INTERVENTIONS  DESIRED  Family Communication:   Patient status:  Patient is from :  Anticipated discharge to:  Estimated DC date:   Consultants:   Procedures:  Antimicrobials:  Anti-infectives (From admission, onward)    None       Subjective:   Objective: Vitals:   06/24/23 0400 06/24/23 0421 06/24/23 0428 06/24/23 0745  BP: 110/60 116/63  (!) 115/59  Pulse: 87 88  88  Resp: 19 20  20   Temp:  98.1 F (36.7 C)  97.8 F (36.6 C)  TempSrc:  Oral  Oral  SpO2: 95% 95%  96%  Weight:   89.4 kg   Height:        Intake/Output Summary (Last 24 hours) at 06/24/2023 0748 Last data filed at 06/24/2023 0500 Gross per 24 hour  Intake 840 ml  Output 900 ml  Net -60 ml   Filed Weights   06/22/23 0500 06/23/23 0400 06/24/23 0428  Weight: 90.3 kg 90.2 kg 89.4 kg    Examination:  General exam: Overall comfortable, not in distress HEENT: PERRL Respiratory system:  no wheezes or crackles  Cardiovascular system: S1 & S2 heard, RRR.  Gastrointestinal system: Abdomen is nondistended, soft and nontender. Central nervous system: Alert and oriented Extremities: No edema, no clubbing ,no cyanosis Skin: No rashes, no ulcers,no icterus     Data Reviewed: I have personally reviewed following labs and imaging studies  CBC: Recent Labs  Lab 06/18/23 1648 06/19/23 0500 06/22/23 0305 06/22/23 1647  WBC 7.2 7.8 8.4  --  NEUTROABS 5.3  --  4.8  --   HGB 14.0 13.9 12.8 13.6  HCT 46.0 44.8 41.3 40.0  MCV 98.7 97.6 97.4  --   PLT 304 326 299  --    Basic Metabolic Panel: Recent Labs  Lab 06/19/23 0500 06/20/23 0246 06/21/23 0244 06/22/23 0305 06/22/23 1647 06/23/23 0246  NA 137 139 138 141 141 139  K 4.7 4.9 4.4 4.5 4.2 4.5  CL 100 97* 97* 104  --  106  CO2 29 35* 32 29  --  27  GLUCOSE 100* 108* 105* 125*  --  103*  BUN 23 36* 47* 50*  --  30*  CREATININE 1.25* 1.58* 1.72* 1.50*  --  1.18*  CALCIUM 8.6* 8.7* 8.6* 8.0*  --  8.0*  MG 2.0  --   --   --   --   --       Recent Results (from the past 240 hours)  Resp panel by RT-PCR (RSV, Flu A&B, Covid) Anterior Nasal Swab     Status: None   Collection Time: 06/18/23  3:57 PM   Specimen: Anterior Nasal Swab  Result Value Ref Range Status   SARS Coronavirus 2 by RT PCR NEGATIVE NEGATIVE Final   Influenza A by PCR NEGATIVE NEGATIVE Final   Influenza B by PCR NEGATIVE NEGATIVE Final    Comment: (NOTE) The Xpert Xpress SARS-CoV-2/FLU/RSV plus assay is intended as an aid in the diagnosis of influenza from Nasopharyngeal swab specimens and should not be used as a sole basis for treatment. Nasal washings and aspirates are unacceptable for Xpert Xpress SARS-CoV-2/FLU/RSV testing.  Fact Sheet for Patients: BloggerCourse.com  Fact Sheet for Healthcare Providers: SeriousBroker.it  This test is not yet approved or cleared by the [place] FDA and has been authorized for detection and/or diagnosis of SARS-CoV-2 by FDA under an Emergency Use Authorization (EUA). This EUA will remain in effect (meaning this test can be used) for the duration of the COVID-19 declaration under Section 564(b)(1) of the Act, 21 U.S.C. section 360bbb-3(b)(1), unless the authorization is terminated or revoked.     Resp Syncytial Virus by PCR NEGATIVE NEGATIVE Final    Comment: (NOTE) Fact Sheet for Patients: BloggerCourse.com  Fact Sheet for Healthcare Providers: SeriousBroker.it  This test is not yet approved or cleared by the [place] FDA and has been authorized for detection and/or diagnosis of SARS-CoV-2 by FDA under an Emergency Use Authorization (EUA). This EUA will remain in effect (meaning this test can be used) for the duration of the COVID-19 declaration under Section 564(b)(1) of the Act, 21 U.S.C. section 360bbb-3(b)(1), unless the authorization is terminated or revoked.  Performed at [facility], [address] [place], [place] [address]      Radiology Studies: CARDIAC CATHETERIZATION Result Date: 06/22/2023 1.  Patent left main with no significant stenosis 2.  Patent LAD with diffuse calcification and moderate mid vessel stenosis of 50 to 60% 3.  Patent circumflex with no significant stenosis 4.  Large, dominant RCA with diffuse calcification but no significant stenosis 5.  Normal LVEDP 6.  Moderate pulmonary hypertension with PA pressure 65/33 mean 45 mmHg, transpulmonary gradient 25 mmHg, PVR 6 Wood units 7.  Preserved cardiac output of 4 L/min with a cardiac index of 2.2 Recommend: medical therapy, no indication for PCI, further evaluation/treatment of pulmonary HTN per inpatient team    Scheduled Meds:  ARIPiprazole  2 mg Oral Daily   aspirin EC  81 mg  Oral Daily   Chlorhexidine Gluconate Cloth  6 each Topical Q0600   enoxaparin (LOVENOX) injection  40 mg Subcutaneous Q24H   sodium chloride flush  3 mL Intravenous Q12H   sodium chloride flush  3 mL Intravenous Q12H   Continuous Infusions:  promethazine (PHENERGAN) injection (IM or IVPB)       LOS: 5 days   Burnadette Pop, MD [facility] P3/23/2025, 7:48 AM

## 2023-06-24 NOTE — Plan of Care (Signed)
  Problem: Clinical Measurements: Goal: Respiratory complications will improve Outcome: Progressing Goal: Cardiovascular complication will be avoided Outcome: Progressing   Problem: Clinical Measurements: Goal: Cardiovascular complication will be avoided Outcome: Progressing   Problem: Nutrition: Goal: Adequate nutrition will be maintained Outcome: Progressing   Problem: Coping: Goal: Level of anxiety will decrease Outcome: Progressing   Problem: Pain Managment: Goal: General experience of comfort will improve and/or be controlled Outcome: Progressing

## 2023-06-25 DIAGNOSIS — I5033 Acute on chronic diastolic (congestive) heart failure: Secondary | ICD-10-CM | POA: Diagnosis not present

## 2023-06-25 DIAGNOSIS — J9601 Acute respiratory failure with hypoxia: Secondary | ICD-10-CM | POA: Diagnosis not present

## 2023-06-25 LAB — POCT I-STAT 7, (LYTES, BLD GAS, ICA,H+H)
Acid-Base Excess: 2 mmol/L (ref 0.0–2.0)
Bicarbonate: 27.8 mmol/L (ref 20.0–28.0)
Calcium, Ion: 1.14 mmol/L — ABNORMAL LOW (ref 1.15–1.40)
HCT: 40 % (ref 36.0–46.0)
Hemoglobin: 13.6 g/dL (ref 12.0–15.0)
O2 Saturation: 90 %
Potassium: 4.3 mmol/L (ref 3.5–5.1)
Sodium: 141 mmol/L (ref 135–145)
TCO2: 29 mmol/L (ref 22–32)
pCO2 arterial: 48.4 mmHg — ABNORMAL HIGH (ref 32–48)
pH, Arterial: 7.367 (ref 7.35–7.45)
pO2, Arterial: 62 mmHg — ABNORMAL LOW (ref 83–108)

## 2023-06-25 MED ORDER — METOPROLOL SUCCINATE ER 25 MG PO TB24
25.0000 mg | ORAL_TABLET | Freq: Every day | ORAL | Status: DC
  Administered 2023-06-25 – 2023-07-06 (×12): 25 mg via ORAL
  Filled 2023-06-25 (×12): qty 1

## 2023-06-25 MED ORDER — TRAMADOL HCL 50 MG PO TABS
50.0000 mg | ORAL_TABLET | Freq: Four times a day (QID) | ORAL | Status: DC | PRN
  Administered 2023-06-25 – 2023-06-26 (×5): 50 mg via ORAL
  Filled 2023-06-25 (×5): qty 1

## 2023-06-25 MED FILL — Ondansetron HCl Inj 4 MG/2ML (2 MG/ML): INTRAMUSCULAR | Qty: 2 | Status: AC

## 2023-06-25 NOTE — Progress Notes (Signed)
 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  On presentation ,she was noted to be tachypneic with increased work of breathing, had to put on BiPAP.  COVID/flu/RSV negative.  Chest lower lobe bilateral lower lobe airspace opacities.  CTA chest ruled out PE.  BNP was elevated.  Patient was admitted for the management of acute on chronic HFpEF.  Cardiology was consulted and following.  PT /OT  recommending SNF on discharge.  Assessment & Plan:  Principal Problem:   Acute respiratory failure with hypoxia (HCC) Active Problems:   Hyperlipemia, mixed   Depression, major, single episode, moderate (HCC)   Essential hypertension   COPD (chronic obstructive pulmonary disease) with chronic bronchitis (HCC)   Coronary artery disease involving native coronary artery of native heart with angina pectoris (HCC)   Chronic kidney disease, stage 3a (HCC)   Acute on chronic heart failure with preserved ejection fraction (HFpEF) (HCC)   Acute hypoxic respiratory failure secondary to CHF exacerbation:   Hypoxic on arrival, had to put on BiPAP, now weaned to room air.  Continue to wean the oxygen.  CTA negative for PE.  Chest x-ray showed lower lobe opacities likely from pulm edema.  Continue bronchodilators as needed, incentive spirometer.  On 2 L of oxygen this morning.  We are trying to wean.  Acute on chronic HFpEF/pulmonary hypertension: Presented with bilateral lower extremity edema, dyspnea on exertion.elevated BNP.  She was on IV Lasix.  Echocardiogram done here showed preserved EF, grade 1 diastolic dysfunction, pulmonary  hypertension.  Cardiology did cardiac cath which showed diffuse coronary disease but no target for PCI.  Current plan is for medical management.  Also found to have moderate pulmonary hypertension.  She should follow-up with cardiology as an outpatient for management of pulmonary hypertension.  Started on low-dose Lasix  COPD: Currently without wheezing.  Continue bronchodilators as needed  Coronary artery disease/elevated troponin: Declines any chest pain at present.  Troponins mildly elevated.  Echo did not show any wall motion abnormality. cardiac cath which showed diffuse coronary disease but no target for PCI.  Current plan is for medical management.  Pulmonary nodules: Seen on CT.  Follow-up recommended with CT chest in a year  History of frequent symptomatic PVCs: On flecainide, Toprol at home.  Frequently will be stopped.  Continue metoprolol  History of hypertension: Hospital course remarkable for hypotension.  She was on metoprolol, amlodipine, lisinopril at home.these meds were held due to hypotension, given IV fluids. Currently blood pressure stable.  Restarted metoprolol  AKI on CKD stage IIIb: Baseline creatinine around 1.1-1.5.  Currently kidney function at baseline.  Hyperlipidemia: Currently not on statin  Depression/anxiety: On Abilify  Debility/deconditioning: Patient seen by PT/OT and recommended SNF on discharge.  TOC following        DVT prophylaxis:enoxaparin (LOVENOX) injection 40 mg Start: 06/23/23 1000     Code Status: Do not attempt resuscitation (DNR) PRE-ARREST INTERVENTIONS DESIRED  Family Communication: None at the bedside  Patient status:Inpatient  Patient is from :Home  Anticipated discharge to:SNF  Estimated DC date:1-2 days   Consultants: Cardiology  Procedures:Cardiac cath  Antimicrobials:  Anti-infectives (From admission, onward)    None  Subjective: Patient seen and examined at bedside today.  Hemodynamically stable.   Overall comfortable.  Lying on chair.  She has nonpitting bilateral lower extremity edema.  Lungs are almost clear on auscultation today.  On 2 L of oxygen per minute  Objective: Vitals:   06/25/23 0011 06/25/23 0448 06/25/23 0723 06/25/23 0800  BP: (!) 123/59 (!) 113/52 114/64 (!) 123/59  Pulse: (!) 59 94 98 (!) 102  Resp: (!) 22 (!) 22 18   Temp: 99 F (37.2 C) 98.4 F (36.9 C) 98.2 F (36.8 C) 98.2 F (36.8 C)  TempSrc: Axillary Oral Oral Oral  SpO2: 97% 96% 99% 91%  Weight:      Height:        Intake/Output Summary (Last 24 hours) at 06/25/2023 1043 Last data filed at 06/25/2023 1610 Gross per 24 hour  Intake 730 ml  Output 1250 ml  Net -520 ml   Filed Weights   06/22/23 0500 06/23/23 0400 06/24/23 0428  Weight: 90.3 kg 90.2 kg 89.4 kg    Examination:   General exam: Overall comfortable, not in distress HEENT: PERRL Respiratory system: Diminished sounds in bases, faint crackles Cardiovascular system: S1 & S2 heard, RRR.  Gastrointestinal system: Abdomen is nondistended, soft and nontender. Central nervous system: Alert and oriented Extremities: Bilateral lower extremity nonpitting edema, no clubbing ,no cyanosis Skin: No rashes, no ulcers,no icterus     Data Reviewed: I have personally reviewed following labs and imaging studies  CBC: Recent Labs  Lab 06/18/23 1648 06/19/23 0500 06/22/23 0305 06/22/23 1647  WBC 7.2 7.8 8.4  --   NEUTROABS 5.3  --  4.8  --   HGB 14.0 13.9 12.8 13.6  HCT 46.0 44.8 41.3 40.0  MCV 98.7 97.6 97.4  --   PLT 304 326 299  --    Basic Metabolic Panel: Recent Labs  Lab 06/19/23 0500 06/20/23 0246 06/21/23 0244 06/22/23 0305 06/22/23 1647 06/23/23 0246  NA 137 139 138 141 141 139  K 4.7 4.9 4.4 4.5 4.2 4.5  CL 100 97* 97* 104  --  106  CO2 29 35* 32 29  --  27  GLUCOSE 100* 108* 105* 125*  --  103*  BUN 23 36* 47* 50*  --  30*  CREATININE 1.25* 1.58* 1.72* 1.50*  --  1.18*  CALCIUM 8.6* 8.7* 8.6* 8.0*  --  8.0*  MG  2.0  --   --   --   --   --      Recent Results (from the past 240 hours)  Resp panel by RT-PCR (RSV, Flu A&B, Covid) Anterior Nasal Swab     Status: None   Collection Time: 06/18/23  3:57 PM   Specimen: Anterior Nasal Swab  Result Value Ref Range Status   SARS Coronavirus 2 by RT PCR NEGATIVE NEGATIVE Final   Influenza A by PCR NEGATIVE NEGATIVE Final   Influenza B by PCR NEGATIVE NEGATIVE Final    Comment: (NOTE) The Xpert Xpress SARS-CoV-2/FLU/RSV plus assay is intended as an aid in the diagnosis of influenza from Nasopharyngeal swab specimens and should not be used as a sole basis for treatment. Nasal washings and aspirates are unacceptable for Xpert Xpress SARS-CoV-2/FLU/RSV testing.  Fact Sheet for Patients: BloggerCourse.com  Fact Sheet for Healthcare Providers: SeriousBroker.it  This test is not yet approved or cleared by the [place] FDA and has been authorized for detection and/or diagnosis of SARS-CoV-2 by FDA under an Emergency Use Authorization (EUA). This EUA  will remain in effect (meaning this test can be used) for the duration of the COVID-19 declaration under Section 564(b)(1) of the Act, 21 U.S.C. section 360bbb-3(b)(1), unless the authorization is terminated or revoked.     Resp Syncytial Virus by PCR NEGATIVE NEGATIVE Final    Comment: (NOTE) Fact Sheet for Patients: BloggerCourse.com  Fact Sheet for Healthcare Providers: SeriousBroker.it  This test is not yet approved or cleared by the [place] FDA and has been authorized for detection and/or diagnosis of SARS-CoV-2 by FDA under an Emergency Use Authorization (EUA). This EUA will remain in effect (meaning this test can be used) for the duration of the COVID-19 declaration under Section 564(b)(1) of the Act, 21 U.S.C. section 360bbb-3(b)(1), unless the authorization is terminated  or revoked.  Performed at [facility], [address] [place], [place] [address]      Radiology Studies: No results found.   Scheduled Meds:  ARIPiprazole  2 mg Oral Daily   aspirin EC  81 mg Oral Daily   Chlorhexidine Gluconate Cloth  6 each Topical Q0600   enoxaparin (LOVENOX) injection  40 mg Subcutaneous Q24H   furosemide  20 mg Oral Daily   metoprolol succinate  25 mg Oral Daily   polyethylene glycol  17 g Oral Daily   senna-docusate  1 tablet Oral BID   sodium chloride flush  3 mL Intravenous Q12H   sodium chloride flush  3 mL Intravenous Q12H   Continuous Infusions:  promethazine (PHENERGAN) injection (IM or IVPB)       LOS: 6 days   Burnadette Pop, MD [facility] P3/24/2025, 10:43 AM  PROGRESS NOTE  Munira Polson  ZHY:[id number] DOB: 1950-10-27 DOA: 06/18/2023 PCP: [name], MD   Brief Narrative:   Assessment & Plan:  Principal Problem:   Acute respiratory failure with hypoxia (HCC) Active Problems:   Hyperlipemia, mixed   Depression, major, single episode, moderate (HCC)   Essential hypertension   COPD (chronic obstructive pulmonary disease) with chronic bronchitis (HCC)   Coronary artery disease involving native coronary artery of native heart with angina pectoris (HCC)   Chronic kidney disease, stage 3a (HCC)   Acute on chronic heart failure with preserved ejection fraction (HFpEF) (HCC)           DVT prophylaxis:enoxaparin (LOVENOX) injection 40 mg Start: 06/23/23 1000     Code Status: Do not attempt resuscitation (DNR) PRE-ARREST INTERVENTIONS DESIRED  Family Communication:   Patient status:  Patient is from :  Anticipated discharge to:  Estimated DC date:   Consultants:   Procedures:  Antimicrobials:  Anti-infectives (From admission, onward)    None       Subjective:   Objective: Vitals:   06/25/23 0011 06/25/23 0448 06/25/23 0723 06/25/23 0800  BP: (!) 123/59 (!) 113/52 114/64 (!)  123/59  Pulse: (!) 59 94 98 (!) 102  Resp: (!) 22 (!) 22 18   Temp: 99 F (37.2 C) 98.4 F (36.9 C) 98.2 F (36.8 C) 98.2 F (36.8 C)  TempSrc: Axillary Oral Oral Oral  SpO2: 97% 96% 99% 91%  Weight:      Height:        Intake/Output Summary (Last 24 hours) at 06/25/2023 1043 Last data filed at 06/25/2023 0838 Gross per 24 hour  Intake 730 ml  Output 1250 ml  Net -520 ml   Filed Weights   06/22/23 0500 06/23/23 0400 06/24/23 0428  Weight: 90.3 kg 90.2 kg 89.4 kg    Examination:  General exam: Overall comfortable, not in distress HEENT: PERRL Respiratory system:  no wheezes or crackles  Cardiovascular system: S1 & S2 heard, RRR.  Gastrointestinal system: Abdomen is nondistended, soft and nontender. Central nervous system: Alert and oriented Extremities: No edema, no clubbing ,no cyanosis Skin: No rashes, no ulcers,no icterus     Data Reviewed: I have personally reviewed following labs and imaging studies  CBC: Recent Labs  Lab 06/18/23 1648 06/19/23 0500 06/22/23 0305 06/22/23 1647  WBC 7.2 7.8 8.4  --   NEUTROABS 5.3  --  4.8  --   HGB 14.0 13.9 12.8 13.6  HCT 46.0 44.8 41.3 40.0  MCV 98.7 97.6 97.4  --   PLT 304 326 299  --    Basic Metabolic Panel: Recent Labs  Lab 06/19/23 0500 06/20/23 0246 06/21/23 0244 06/22/23 0305 06/22/23 1647 06/23/23 0246  NA 137 139 138 141 141 139  K 4.7 4.9 4.4 4.5 4.2 4.5  CL 100 97* 97* 104  --  106  CO2 29 35* 32 29  --  27  GLUCOSE 100* 108* 105* 125*  --  103*  BUN 23 36* 47* 50*  --  30*  CREATININE 1.25* 1.58* 1.72* 1.50*  --  1.18*  CALCIUM 8.6* 8.7* 8.6* 8.0*  --  8.0*  MG 2.0  --   --   --   --   --      Recent Results (from the past 240 hours)  Resp panel by RT-PCR (RSV, Flu A&B, Covid) Anterior Nasal Swab     Status: None   Collection Time: 06/18/23  3:57 PM   Specimen: Anterior Nasal Swab  Result Value Ref Range Status   SARS Coronavirus 2 by RT PCR NEGATIVE NEGATIVE Final   Influenza A by PCR  NEGATIVE NEGATIVE Final   Influenza B by PCR NEGATIVE NEGATIVE Final    Comment: (NOTE) The Xpert Xpress SARS-CoV-2/FLU/RSV plus assay is intended as an aid in the diagnosis of influenza from Nasopharyngeal swab specimens and should not be used as a sole basis for treatment. Nasal washings and aspirates are unacceptable for Xpert Xpress SARS-CoV-2/FLU/RSV testing.  Fact Sheet for Patients: BloggerCourse.com  Fact Sheet for Healthcare Providers: SeriousBroker.it  This test is not yet approved or cleared by the [place] FDA and has been authorized for detection and/or diagnosis of SARS-CoV-2 by FDA under an Emergency Use Authorization (EUA). This EUA will remain in effect (meaning this test can be used) for the duration of the COVID-19 declaration under Section 564(b)(1) of the Act, 21 U.S.C. section 360bbb-3(b)(1), unless the authorization is terminated or revoked.     Resp Syncytial Virus by PCR NEGATIVE NEGATIVE Final    Comment: (NOTE) Fact Sheet for Patients: BloggerCourse.com  Fact Sheet for Healthcare Providers: SeriousBroker.it  This test is not yet approved or cleared by the [place] FDA and has been authorized for detection and/or diagnosis of SARS-CoV-2 by FDA under an Emergency Use Authorization (EUA). This EUA will remain in effect (meaning this test can be used) for the duration of the COVID-19 declaration under Section 564(b)(1) of the Act, 21 U.S.C. section 360bbb-3(b)(1), unless the authorization is terminated or revoked.  Performed at [facility], [address], [place], [place] [address]      Radiology Studies: No results found.   Scheduled Meds:  ARIPiprazole  2 mg Oral Daily   aspirin EC  81 mg Oral Daily   Chlorhexidine Gluconate Cloth  6 each Topical Q0600  enoxaparin (LOVENOX) injection  40 mg Subcutaneous Q24H   furosemide   20 mg Oral Daily   metoprolol succinate  25 mg Oral Daily   polyethylene glycol  17 g Oral Daily   senna-docusate  1 tablet Oral BID   sodium chloride flush  3 mL Intravenous Q12H   sodium chloride flush  3 mL Intravenous Q12H   Continuous Infusions:  promethazine (PHENERGAN) injection (IM or IVPB)       LOS: 6 days   Burnadette Pop, MD [facility] P3/24/2025, 10:43 AM  PROGRESS NOTE  Wiletta Bermingham  UXL:[id number] DOB: 02-10-1951 DOA: 06/18/2023 PCP: [name], MD   Brief Narrative:   Assessment & Plan:  Principal Problem:   Acute respiratory failure with hypoxia (HCC) Active Problems:   Hyperlipemia, mixed   Depression, major, single episode, moderate (HCC)   Essential hypertension   COPD (chronic obstructive pulmonary disease) with chronic bronchitis (HCC)   Coronary artery disease involving native coronary artery of native heart with angina pectoris (HCC)   Chronic kidney disease, stage 3a (HCC)   Acute on chronic heart failure with preserved ejection fraction (HFpEF) (HCC)           DVT prophylaxis:enoxaparin (LOVENOX) injection 40 mg Start: 06/23/23 1000     Code Status: Do not attempt resuscitation (DNR) PRE-ARREST INTERVENTIONS DESIRED  Family Communication:   Patient status:  Patient is from :  Anticipated discharge to:  Estimated DC date:   Consultants:   Procedures:  Antimicrobials:  Anti-infectives (From admission, onward)    None       Subjective:   Objective: Vitals:   06/25/23 0011 06/25/23 0448 06/25/23 0723 06/25/23 0800  BP: (!) 123/59 (!) 113/52 114/64 (!) 123/59  Pulse: (!) 59 94 98 (!) 102  Resp: (!) 22 (!) 22 18   Temp: 99 F (37.2 C) 98.4 F (36.9 C) 98.2 F (36.8 C) 98.2 F (36.8 C)  TempSrc: Axillary Oral Oral Oral  SpO2: 97% 96% 99% 91%  Weight:      Height:        Intake/Output Summary (Last 24 hours) at 06/25/2023 1043 Last data filed at 06/25/2023 0838 Gross per 24 hour  Intake 730 ml   Output 1250 ml  Net -520 ml   Filed Weights   06/22/23 0500 06/23/23 0400 06/24/23 0428  Weight: 90.3 kg 90.2 kg 89.4 kg    Examination:  General exam: Overall comfortable, not in distress HEENT: PERRL Respiratory system:  no wheezes or crackles  Cardiovascular system: S1 & S2 heard, RRR.  Gastrointestinal system: Abdomen is nondistended, soft and nontender. Central nervous system: Alert and oriented Extremities: No edema, no clubbing ,no cyanosis Skin: No rashes, no ulcers,no icterus     Data Reviewed: I have personally reviewed following labs and imaging studies  CBC: Recent Labs  Lab 06/18/23 1648 06/19/23 0500 06/22/23 0305 06/22/23 1647  WBC 7.2 7.8 8.4  --   NEUTROABS 5.3  --  4.8  --   HGB 14.0 13.9 12.8 13.6  HCT 46.0 44.8 41.3 40.0  MCV 98.7 97.6 97.4  --   PLT 304 326 299  --    Basic Metabolic Panel: Recent Labs  Lab 06/19/23 0500 06/20/23 0246 06/21/23 0244 06/22/23 0305 06/22/23 1647 06/23/23 0246  NA 137 139 138 141 141 139  K 4.7 4.9 4.4 4.5 4.2 4.5  CL 100 97* 97* 104  --  106  CO2 29 35* 32 29  --  27  GLUCOSE  100* 108* 105* 125*  --  103*  BUN 23 36* 47* 50*  --  30*  CREATININE 1.25* 1.58* 1.72* 1.50*  --  1.18*  CALCIUM 8.6* 8.7* 8.6* 8.0*  --  8.0*  MG 2.0  --   --   --   --   --      Recent Results (from the past 240 hours)  Resp panel by RT-PCR (RSV, Flu A&B, Covid) Anterior Nasal Swab     Status: None   Collection Time: 06/18/23  3:57 PM   Specimen: Anterior Nasal Swab  Result Value Ref Range Status   SARS Coronavirus 2 by RT PCR NEGATIVE NEGATIVE Final   Influenza A by PCR NEGATIVE NEGATIVE Final   Influenza B by PCR NEGATIVE NEGATIVE Final    Comment: (NOTE) The Xpert Xpress SARS-CoV-2/FLU/RSV plus assay is intended as an aid in the diagnosis of influenza from Nasopharyngeal swab specimens and should not be used as a sole basis for treatment. Nasal washings and aspirates are unacceptable for Xpert Xpress  SARS-CoV-2/FLU/RSV testing.  Fact Sheet for Patients: BloggerCourse.com  Fact Sheet for Healthcare Providers: SeriousBroker.it  This test is not yet approved or cleared by the [place] FDA and has been authorized for detection and/or diagnosis of SARS-CoV-2 by FDA under an Emergency Use Authorization (EUA). This EUA will remain in effect (meaning this test can be used) for the duration of the COVID-19 declaration under Section 564(b)(1) of the Act, 21 U.S.C. section 360bbb-3(b)(1), unless the authorization is terminated or revoked.     Resp Syncytial Virus by PCR NEGATIVE NEGATIVE Final    Comment: (NOTE) Fact Sheet for Patients: BloggerCourse.com  Fact Sheet for Healthcare Providers: SeriousBroker.it  This test is not yet approved or cleared by the [place] FDA and has been authorized for detection and/or diagnosis of SARS-CoV-2 by FDA under an Emergency Use Authorization (EUA). This EUA will remain in effect (meaning this test can be used) for the duration of the COVID-19 declaration under Section 564(b)(1) of the Act, 21 U.S.C. section 360bbb-3(b)(1), unless the authorization is terminated or revoked.  Performed at [facility], [address] [place], [place] [address]      Radiology Studies: No results found.   Scheduled Meds:  ARIPiprazole  2 mg Oral Daily   aspirin EC  81 mg Oral Daily   Chlorhexidine Gluconate Cloth  6 each Topical Q0600   enoxaparin (LOVENOX) injection  40 mg Subcutaneous Q24H   furosemide  20 mg Oral Daily   metoprolol succinate  25 mg Oral Daily   polyethylene glycol  17 g Oral Daily   senna-docusate  1 tablet Oral BID   sodium chloride flush  3 mL Intravenous Q12H   sodium chloride flush  3 mL Intravenous Q12H   Continuous Infusions:  promethazine (PHENERGAN) injection (IM or IVPB)       LOS: 6 days   Burnadette Pop, MD [facility] P3/24/2025, 10:43 AM

## 2023-06-25 NOTE — Progress Notes (Signed)
 Heart Failure Stewardship Pharmacist Progress Note   PCP: [name], MD PCP-Cardiologist: Verne Carrow, MD    HPI:  73 y.o. female with PMHx of RA, GERD, allergic rhinitis, osteoarthritis, HLD, HTN, COPD, CAD, CKD (stage 3a), and depression/anxiety.  She presented to ED on 03/17 with complaints of worsening SOB. Patient reported she is typically SOB and has a non-productive cough at baseline due to COPD, but 3 days ago she noticed DOE with minimal activity. She also reported lower extremity swelling.  Initial vitals were 152/93, HR 84, RR 32. She was given 6 L O2 via [place] and later transitioned to BiPAP. BNP 715.9. She was given Lasix IV 40 mg prior to admission. CXR on 03/17 showed bilateral lower lobe airspace opacities could reflect atelectasis or pneumonia. Chest CT on  03/17 revealed no evidence of pulmonary embolism, scattered atelectasis or scarring bilaterally, small bilateral pleural effusions, scattered pulmonary nodules not significantly changed from 2022, multi-vessel coronary artery calcifications, aortic arthrosclerosis with aneurysmal dilation of the ascending aorta. She does follow cardiology for CAD. Her SO recently passed ~2 weeks ago. ECHO on 03/18 revealed LVEF of 60-65% (unchanged from 2020), LV has normal function, no regional wall abnormalities, LV G1 DD, signs of RV pressure and volume overload, MV is normal in structure, and right atrial pressure of 3 mmHg.   R/L HC on 03/21 revealed moderate pulmonary HTN (PA pressure 45 mmHg), CO 4 L/min, CI 2.2, RA 13, and wedge 20 mmHg.   Patient was transitioned off IV lasix on 03/19. Today she denied SOB. She remains on [place] 2 L O2. She has been able to get up out of bed and walk around over the weekend. She denied DOE during this time. Bilateral lower extremity edema seen on exam. She is complaining of pain at her tailbone and in her lower extremities. She states that tylenol did not help this pain.   Current HF  Medications: Diuretic: Furosemide 20 mg PO QD Beta Blocker: Metoprolol succinate 25 mg PO every day (ordered) ACE/ARB/ARNI: Lisinopril 10 mg every day (d/c 03/19)  Other: Amlodipine 5 mg every day (d/c on 03/19)  Prior to admission HF Medications: Beta blocker: Metoprolol succinate 50 mg every day (last dispensed 05/2023 for 90 ds) ACE/ARB/ARNI: Lisinopril 10 mg every day (last dispensed 05/2023 for 90 ds) Other: Amlodipine 5 mg every day (last dispensed 06/2023 for 90 ds) and Imdur 30 mg every day (patient reported not taking; last dispensed 06/2023 for 90 ds)  Pertinent Lab Values As of 03/22 Serum creatinine: 1.18(<1.50<1.72), BUN 30(<50<47), Potassium 4.5(<4.2<4.5), Sodium 139(<141<141), BNP 715.9, Magnesium 2 (03/18), A1c 5.8 (05/2023)  Vital Signs: Weight: 197.09 (admission weight: 190.9 lbs<- likely a guess) bed wt Blood pressure: 123/59 Heart rate: 98-102 (102 @ 0800) I/O: net -0.52 L yesterday; net -1.2 L since admission  Medication Assistance / Insurance Benefits Check: Does the patient have prescription insurance?  Yes Type of insurance plan: UHC Medicare  Does the patient qualify for medication assistance through manufacturers or grants?   Yes  Outpatient Pharmacy:  Prior to admission outpatient pharmacy:  [id number] - [place], [place] - [address]   Is the patient willing to use [facility] TOC pharmacy at discharge? Yes Is the patient willing to transition their outpatient pharmacy to utilize a [facility] outpatient pharmacy?   No    Assessment: 1. Acute on chronic HFpEF (LVEF 60-65%). NYHA class I symptoms. -Strict I/Os and daily weights. Keep K>4, Mg>2 -Continue Furosemide 20 mg every day -  Restart metoprolol succinate at 25 mg every day -Re-order standing labs -Consider restarting ACEi at reduced dose tomorrow pending renal function and BP   Plan: 1) Medication changes recommended at this time: -Consider restarting ACEi at reduced dose tomorrow pending  renal function and BP -Restart metoprolol succinate at 25 mg every day  2) Patient assistance: -She is agreeable to using [facility] TOC pharmacy at discharge  3)  Education  - To be completed prior to discharge  Sofie Rower, PharmD [employer] PGY-1

## 2023-06-25 NOTE — TOC Progression Note (Addendum)
 Transition of Care [facility]) - Progression Note    Patient Details  Name: [name] [name] MRN: [id number] Date of Birth: 1950-12-21  [facility]) CM/SW Contact  Leone Haven, RN Phone Number: 06/25/2023, 4:32 PM  Clinical Narrative:    Patient informed CSW that she would rather go home.  NCM offered choice for HHPT, HHOT, she chose [place].  NCM made referral to [facility] with [facility].  He is able to take referral.  Soc will begin 24 to 48 hrs post dc.  Patient states her neighbor gets her medications for her. Her neighbor will transport her home at dc. She gets meds from [facility] on [address].     Expected Discharge Plan: Skilled Nursing Facility Barriers to Discharge: Continued Medical Work up, SNF Pending bed offer, Insurance Authorization  Expected Discharge Plan and Services In-house Referral: Clinical Social Work     Living arrangements for the past 2 months: Single Family Home                                       Social Determinants of Health (SDOH) Interventions SDOH Screenings   Food Insecurity: No Food Insecurity (06/19/2023)  Housing: Low Risk  (06/19/2023)  Transportation Needs: No Transportation Needs (06/19/2023)  Utilities: Not At Risk (06/19/2023)  Alcohol Screen: Low Risk  (05/14/2023)  Depression (PHQ2-9): Low Risk  (05/14/2023)  Recent Concern: Depression (PHQ2-9) - High Risk (05/14/2023)  Financial Resource Strain: Low Risk  (05/14/2023)  Physical Activity: Inactive (05/14/2023)  Social Connections: Socially Isolated (06/19/2023)  Stress: No Stress Concern Present (05/14/2023)  Tobacco Use: Medium Risk (06/18/2023)  Health Literacy: Adequate Health Literacy (05/14/2023)    Readmission Risk Interventions     No data to display

## 2023-06-25 NOTE — Plan of Care (Signed)
  Problem: Health Behavior/Discharge Planning: Goal: Ability to manage health-related needs will improve Outcome: Progressing   Problem: Clinical Measurements: Goal: Ability to maintain clinical measurements within normal limits will improve Outcome: Progressing Goal: Will remain free from infection Outcome: Progressing Goal: Diagnostic test results will improve Outcome: Progressing Goal: Respiratory complications will improve Outcome: Progressing Goal: Cardiovascular complication will be avoided Outcome: Progressing   Problem: Activity: Goal: Risk for activity intolerance will decrease Outcome: Progressing   Problem: Nutrition: Goal: Adequate nutrition will be maintained Outcome: Progressing   Problem: Coping: Goal: Level of anxiety will decrease Outcome: Progressing   Problem: Elimination: Goal: Will not experience complications related to bowel motility Outcome: Progressing Goal: Will not experience complications related to urinary retention Outcome: Progressing   Problem: Pain Managment: Goal: General experience of comfort will improve and/or be controlled Outcome: Progressing   Problem: Safety: Goal: Ability to remain free from injury will improve Outcome: Progressing   Problem: Skin Integrity: Goal: Risk for impaired skin integrity will decrease Outcome: Progressing   Problem: Education: Goal: Ability to demonstrate management of disease process will improve Outcome: Progressing Goal: Ability to verbalize understanding of medication therapies will improve Outcome: Progressing Goal: Individualized Educational Video(s) Outcome: Progressing   Problem: Activity: Goal: Capacity to carry out activities will improve Outcome: Progressing   Problem: Cardiac: Goal: Ability to achieve and maintain adequate cardiopulmonary perfusion will improve Outcome: Progressing   Problem: Education: Goal: Understanding of CV disease, CV risk reduction, and recovery  process will improve Outcome: Progressing Goal: Individualized Educational Video(s) Outcome: Progressing   Problem: Activity: Goal: Ability to return to baseline activity level will improve Outcome: Progressing   Problem: Cardiovascular: Goal: Ability to achieve and maintain adequate cardiovascular perfusion will improve Outcome: Progressing Goal: Vascular access site(s) Level 0-1 will be maintained Outcome: Progressing   Problem: Health Behavior/Discharge Planning: Goal: Ability to safely manage health-related needs after discharge will improve Outcome: Progressing

## 2023-06-25 NOTE — Progress Notes (Signed)
 Physical Therapy Treatment Patient Details Name: [name] [name] MRN: [id number] DOB: 23-May-1950 Today's Date: 06/25/2023   History of Present Illness Pt is a 73 y.o. F admitted 06/18/23 with SOB and associated hypoxia. CTA chest neg for PE, had small bilat pleural effusions and small nodules. Pt was diuresed, found to be hypotensive, and received 500 cc of fluid. 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    PT Comments  Pt engaged in gait training, ambulating ~147ft using RW with CGA. She depends on an AD  for increased stability and drifted towards the left during gait. She continues to require supplemental oxygen (2L) to maintain SpO2 >90% with mobility. Pt continues to be unsafe to discharge home alone due to her high fall risk and will benefit from continued post-acute therapy <3 hours/day therapy.   If plan is discharge home, recommend the following: A little help with walking and/or transfers;A little help with bathing/dressing/bathroom;Assistance with cooking/housework;Assist for transportation;Help with stairs or ramp for entrance   Can travel by private vehicle     Yes  Equipment Recommendations  Rolling walker (2 wheels)    Recommendations for Other Services       Precautions / Restrictions Precautions Precautions: Fall Recall of Precautions/Restrictions: Intact Restrictions Weight Bearing Restrictions Per Provider Order: No     Mobility  Bed Mobility Overal bed mobility: Needs Assistance Bed Mobility: Supine to Sit     Supine to sit: HOB elevated, Used rails, Supervision     General bed mobility comments: Pt took increased time to sit upright with HOB elevated and use of bedrails.    Transfers Overall transfer level: Needs assistance Equipment used: Rolling walker (2 wheels) Transfers: Sit to/from Stand Sit to Stand: Contact guard assist           General transfer comment: Pt stood from lowest bed height with proper hand positioning on  RW. Good eccentric control.    Ambulation/Gait Ambulation/Gait assistance: Contact guard assist Gait Distance (Feet): 100 Feet Assistive device: Rolling walker (2 wheels) Gait Pattern/deviations: Step-through pattern, Decreased stride length, Wide base of support, Trunk flexed, Drifts right/left Gait velocity: reduced Gait velocity interpretation: <1.8 ft/sec, indicate of risk for recurrent falls   General Gait Details: Pt ambulated with short slow steps, WBOS, limited foot clearence, and trunk flex over RW. She drifted towards the L during gait.   Stairs             Wheelchair Mobility     Tilt Bed    Modified Rankin (Stroke Patients Only)       Balance Overall balance assessment: Needs assistance Sitting-balance support: No upper extremity supported, Feet supported, Bilateral upper extremity supported Sitting balance-Leahy Scale: Fair Sitting balance - Comments: Pt sat EOB with supervision. She had difficulty scooting fwd/bkwd with BUE support.   Standing balance support: During functional activity, Bilateral upper extremity supported, Reliant on assistive device for balance Standing balance-Leahy Scale: Poor Standing balance comment: Pt stood and ambulated with RW. Unsteady, but no overt LOB.                            Communication Communication Communication: No apparent difficulties  Cognition Arousal: Alert Behavior During Therapy: WFL for tasks assessed/performed   PT - Cognitive impairments: No apparent impairments                         Following commands: Intact  Cueing Cueing Techniques: Verbal cues  Exercises      General Comments General comments (skin integrity, edema, etc.): VSS on 2L. BLE swelling, no pitting edema present. Pt experienced DOE during mobility.      Pertinent Vitals/Pain Pain Assessment Pain Assessment: Faces Faces Pain Scale: Hurts little more Pain Location: L knee Pain Descriptors /  Indicators: Aching, Discomfort, Sore    Home Living                          Prior Function            PT Goals (current goals can now be found in the care plan section) Acute Rehab PT Goals Patient Stated Goal: Return Home Progress towards PT goals: Progressing toward goals    Frequency    Min 2X/week      PT Plan      Co-evaluation              AM-PAC PT "6 Clicks" Mobility   Outcome Measure  Help needed turning from your back to your side while in a flat bed without using bedrails?: A Little Help needed moving from lying on your back to sitting on the side of a flat bed without using bedrails?: A Little Help needed moving to and from a bed to a chair (including a wheelchair)?: A Little Help needed standing up from a chair using your arms (e.g., wheelchair or bedside chair)?: A Little Help needed to walk in hospital room?: A Little Help needed climbing 3-5 steps with a railing? : A Little 6 Click Score: 18    End of Session Equipment Utilized During Treatment: Gait belt;Oxygen Activity Tolerance: Patient tolerated treatment well Patient left: in chair;with call bell/phone within reach;with chair alarm set Nurse Communication: Mobility status PT Visit Diagnosis: Muscle weakness (generalized) (M62.81);History of falling (Z91.81);Repeated falls (R29.6);Unsteadiness on feet (R26.81);Difficulty in walking, not elsewhere classified (R26.2);Other abnormalities of gait and mobility (R26.89)     Time: 1610-9604 PT Time Calculation (min) (ACUTE ONLY): 21 min  Charges:    $Gait Training: 8-22 mins PT General Charges $$ ACUTE PT VISIT: 1 Visit                     Cheri Guppy, PT, DPT Acute Rehabilitation Services Office: [phone] Secure Chat Preferred  [name] [name] 06/25/2023, 11:06 AM

## 2023-06-25 NOTE — Progress Notes (Signed)
   Patient Name: [name] [name] Date of Encounter: 06/25/2023 [facility] Cardiologist: Verne Carrow, MD   Interval Summary  .    Patient complaining of her legs being more swollen today.  Also feels like her "heart is beating out of her chest."  No chest pressure or pain.  Patient on O21.5 Liters per nasal cannula this morning.  Sitting in the chair at the bedside.  Vital Signs .    Vitals:   06/25/23 0011 06/25/23 0448 06/25/23 0723 06/25/23 0800  BP: (!) 123/59 (!) 113/52 114/64 (!) 123/59  Pulse: (!) 59 94 98 (!) 102  Resp: (!) 22 (!) 22 18   Temp: 99 F (37.2 C) 98.4 F (36.9 C) 98.2 F (36.8 C) 98.2 F (36.8 C)  TempSrc: Axillary Oral Oral Oral  SpO2: 97% 96% 99% 91%  Weight:      Height:        Intake/Output Summary (Last 24 hours) at 06/25/2023 0946 Last data filed at 06/25/2023 7425 Gross per 24 hour  Intake 730 ml  Output 1250 ml  Net -520 ml      06/24/2023    4:28 AM 06/23/2023    4:00 AM 06/22/2023    5:00 AM  Last 3 Weights  Weight (lbs) 197 lb 1.5 oz 198 lb 13.7 oz 199 lb 1.2 oz  Weight (kg) 89.4 kg 90.2 kg 90.3 kg      Telemetry/ECG    Sinus rhythm with frequent PVCs and ventricular bigeminy - Personally Reviewed  Physical Exam .   GEN: No acute distress.   Neck: No JVD Cardiac: Irregularly irregular, no murmurs, rubs, or gallops.  Respiratory: Clear to auscultation bilaterally. GI: Soft, nontender, non-distended  MS: No edema  Assessment & Plan .     1.  Acute on chronic hypoxic respiratory failure, multifactorial with heart failure, pulmonary hypertension, and COPD.  Wean oxygen as tolerated.  Continue oral furosemide.  LVEF preserved. 2.  Symptomatic PVCs: Patient developed symptomatic hypotension after IV diuretic therapy.  Her beta-blocker has been held.  Will restart metoprolol succinate at half of the previous dose, now will start 25 mg daily.  Hold Flecainide in the setting of pulmonary hypertension and coronary artery  disease.  If she remains highly symptomatic may need to consider low-dose flecainide in the context of risk/benefit. 3.  Coronary artery disease, moderate nonobstructive stenosis appropriate for medical therapy.  Aspirin 81 mg daily.  Dispo: resume metoprolol succinate, continue furosemide, wean O2 as tolerated. Approaching medical stability for DC, suspect 24-48 hours.   For questions or updates, please contact [facility] Please consult [email or web address] for contact info under        Signed, Tonny Bollman, MD

## 2023-06-26 ENCOUNTER — Encounter (HOSPITAL_COMMUNITY): Payer: Self-pay | Admitting: Internal Medicine

## 2023-06-26 DIAGNOSIS — J9601 Acute respiratory failure with hypoxia: Secondary | ICD-10-CM | POA: Diagnosis not present

## 2023-06-26 DIAGNOSIS — I5033 Acute on chronic diastolic (congestive) heart failure: Secondary | ICD-10-CM | POA: Diagnosis not present

## 2023-06-26 LAB — MAGNESIUM: Magnesium: 1.6 mg/dL — ABNORMAL LOW (ref 1.7–2.4)

## 2023-06-26 LAB — BASIC METABOLIC PANEL
Anion gap: 9 (ref 5–15)
BUN: 22 mg/dL (ref 8–23)
CO2: 31 mmol/L (ref 22–32)
Calcium: 8.6 mg/dL — ABNORMAL LOW (ref 8.9–10.3)
Chloride: 100 mmol/L (ref 98–111)
Creatinine, Ser: 1.06 mg/dL — ABNORMAL HIGH (ref 0.44–1.00)
GFR, Estimated: 55 mL/min — ABNORMAL LOW (ref >60)
Glucose, Bld: 98 mg/dL (ref 70–99)
Potassium: 4.1 mmol/L (ref 3.5–5.1)
Sodium: 140 mmol/L (ref 135–145)

## 2023-06-26 MED ORDER — MAGNESIUM SULFATE 2 GM/50ML IV SOLN
2.0000 g | Freq: Once | INTRAVENOUS | Status: AC
  Administered 2023-06-26: 2 g via INTRAVENOUS
  Filled 2023-06-26: qty 50

## 2023-06-26 MED ORDER — TEMAZEPAM 15 MG PO CAPS: 15.0000 mg | ORAL_CAPSULE | Freq: Every evening | ORAL | 0 refills | Status: DC | PRN

## 2023-06-26 MED ORDER — SPIRONOLACTONE 12.5 MG HALF TABLET
12.5000 mg | ORAL_TABLET | Freq: Every day | ORAL | Status: DC
  Administered 2023-06-26 – 2023-06-27 (×2): 12.5 mg via ORAL
  Filled 2023-06-26 (×2): qty 1

## 2023-06-26 MED ORDER — ALPRAZOLAM 0.25 MG PO TABS: 0.2500 mg | ORAL_TABLET | Freq: Three times a day (TID) | ORAL | 0 refills | Status: DC | PRN

## 2023-06-26 MED ORDER — ATORVASTATIN CALCIUM 10 MG PO TABS
20.0000 mg | ORAL_TABLET | Freq: Every day | ORAL | Status: DC
  Administered 2023-06-27 – 2023-07-06 (×10): 20 mg via ORAL
  Filled 2023-06-26 (×10): qty 2

## 2023-06-26 MED ORDER — FUROSEMIDE 40 MG PO TABS
40.0000 mg | ORAL_TABLET | Freq: Every day | ORAL | Status: DC
  Administered 2023-06-27 – 2023-07-06 (×10): 40 mg via ORAL
  Filled 2023-06-26 (×10): qty 1

## 2023-06-26 MED ORDER — MEXILETINE HCL 150 MG PO CAPS
150.0000 mg | ORAL_CAPSULE | Freq: Two times a day (BID) | ORAL | Status: DC
  Administered 2023-06-26 – 2023-07-06 (×19): 150 mg via ORAL
  Filled 2023-06-26 (×19): qty 1

## 2023-06-26 NOTE — TOC Progression Note (Addendum)
 Transition of Care [facility]) - Progression Note    Patient Details  Name: [name] [name] MRN: [id number] Date of Birth: 27-Jul-1950  Transition of Care [facility]) CM/SW Contact  Michaela Corner, [place] Phone Number: 06/26/2023, 10:43 AM  Clinical Narrative:   CSW spoke with patient regarding request for SNF. At this time, patient states she wants to go to [facility]. CSW spoke with Marylene Land, about bed availability and per her, [facility] has beds. CSW will submit for insurance auth.   10:46 AM Marylene Land informed CSW that [facility] no longer has bed availability.  11:37 AM CSW spoke with patient about [facility] not having bed availability. Patient stated she will try [facility]. CSW to submit for auth at this time.   11:41 AM Auth now pending. Auth id [id number].   TOC will continue to follow.    Expected Discharge Plan: Skilled Nursing Facility Barriers to Discharge: Continued Medical Work up, SNF Pending bed offer, Insurance Authorization  Expected Discharge Plan and Services In-house Referral: Clinical Social Work     Living arrangements for the past 2 months: Single Family Home                                       Social Determinants of Health (SDOH) Interventions SDOH Screenings   Food Insecurity: No Food Insecurity (06/19/2023)  Housing: Low Risk  (06/19/2023)  Transportation Needs: No Transportation Needs (06/19/2023)  Utilities: Not At Risk (06/19/2023)  Alcohol Screen: Low Risk  (05/14/2023)  Depression (PHQ2-9): Low Risk  (05/14/2023)  Recent Concern: Depression (PHQ2-9) - High Risk (05/14/2023)  Financial Resource Strain: Low Risk  (05/14/2023)  Physical Activity: Inactive (05/14/2023)  Social Connections: Socially Isolated (06/19/2023)  Stress: No Stress Concern Present (05/14/2023)  Tobacco Use: Medium Risk (06/18/2023)  Health Literacy: Adequate Health Literacy (05/14/2023)    Readmission Risk Interventions     No data to display

## 2023-06-26 NOTE — Progress Notes (Addendum)
 Patient Name: [name] [name] Date of Encounter: 06/26/2023 [facility] Cardiologist: Verne Carrow, MD   Interval Summary  .    Feels miserable, still short of breath. No chest chest pain. Remains on Show Low @ 1.5L.   Vital Signs .    Vitals:   06/25/23 1922 06/26/23 0018 06/26/23 0353 06/26/23 0709  BP: (!) 123/59 127/73 119/66 106/60  Pulse: 91 85  87  Resp: 18 19 20 19   Temp: (P) 98.8 F (37.1 C) (P) 98.3 F (36.8 C) 98.2 F (36.8 C) 98.2 F (36.8 C)  TempSrc: (P) Oral (P) Oral Oral Oral  SpO2: 97% 96% 95% 95%  Weight:   (P) 88.7 kg   Height:        Intake/Output Summary (Last 24 hours) at 06/26/2023 0906 Last data filed at 06/26/2023 0857 Gross per 24 hour  Intake 460 ml  Output 2650 ml  Net -2190 ml      06/26/2023    3:53 AM 06/24/2023    4:28 AM 06/23/2023    4:00 AM  Last 3 Weights  Weight (lbs) 195 lb 8.8 oz 197 lb 1.5 oz 198 lb 13.7 oz  Weight (kg) 88.7 kg 89.4 kg 90.2 kg      Telemetry/ECG    Sinus with bigeminy - Personally Reviewed  Physical Exam .    GEN: No acute distress.   Neck: No JVD Cardiac: RRR, no murmurs, rubs, or gallops.  Respiratory: Clear to auscultation bilaterally. GI: Soft, nontender, non-distended  MS: No edema  Assessment & Plan .     [name] [name] is a 73 y.o. female with a hx of CAD, HTN, HLD, CKD IIIa, COPD, OA who is being seen 06/20/2023 for the evaluation of CHF at the request of Dr. Jacqulyn Bath.   Acute on Chronic hypoxic respiratory failure Suspected OSA Pulmonary HTN COPD -- presented with worsening dyspnea on exertion over the past couple of weeks. BNP 715, CT chest with small bilateral effusions -- s/p IV lasix, net - 3.3L -- echo 3/18 with LVEF of 60-65%, g1DD, mildly reduced RV with interventricular septum flattening consistent with RV pressure and volume overload -- cardiac cath 3/21 with non-obstructive CAD, moderate pHTN with PA pressure of  PVCs -- has been on flecainide 50mg  TID  historically but with hx of CAD this was stopped -- on metoprolol XL 25mg  daily -- add mexiletine 150mg  BID    CKD IIIa with AKI -- baseline Cr around 1.2-1.3, up to 1.58>> 1.7>>1.5>>1.06 -- resolved   Urinary retention -- s/p foley  Hypomagnesemia  -- Mag 1.6, suppl  Remains weak, reports she is unable to ambulate and nauseated. Requesting to go for rehab at discharge.    For questions or updates, please contact [facility] Please consult [email or web address] for contact info under   Signed, Laverda Page, NP   Patient seen, examined. Available data reviewed. Agree with findings, assessment, and plan as outlined by Laverda Page, NP.  Patient is independently interviewed and examined.  She is nauseated at present.  Otherwise in no distress.  Lungs are clear to auscultation bilaterally, heart is regular rate and rhythm with frequent premature beats, no murmur gallop, abdomen is soft and nontender, extremities have trace edema.  Skin is warm and dry with no rash.  The patient continues to have frequent symptomatic PVCs.  She is no longer a good candidate for flecainide with coronary artery disease and pulmonary hypertension.  We have started her on mexiletine today.  She continues on low-dose metoprolol succinate.  Creatinine has improved significantly and seems appropriate to increase her oral furosemide to 40 mg daily.  Otherwise, as outlined above.  Tonny Bollman, M.D. 06/26/2023 12:27 PM

## 2023-06-26 NOTE — Progress Notes (Addendum)
 Heart Failure Nurse Navigator Progress Note  PCP: [name], MD PCP-Cardiologist: Clifton James Admission Diagnosis: Acute on Chronic congestive heart failure.  Admitted from: Home via EMS  Presentation:   [name] [name] presented with shortness of breath, oxygen saturation originally 86% on RA. Placed on 5 L o2 then 96%. BP 104/91, HR 88, BNP 715, CT chest with small bilateral effusions, CTA ruled out PE,  ECHO  3/18 with LVEF of 60-65%, g1DD, mildly reduced RV with interventricular septum flattening consistent with RV pressure and volume overload -- cardiac cath 3/21 with non-obstructive CAD, moderate pHTN with PA pressure of . Plan for SNF rehab at discharge.   Patient was educated on the sign and symptoms of heart failure, daily weights, when to call her doctor or go to the ED.Diet/ fluid restrictions, patient reported to eating mainly frozen dinners for meals.) continued education on taking all medications as prescribed and attending all medical appointments.  Patient verbalized her understanding of education, a HF TOC appointment was scheduled for 07/10/2023 @ 11 am.    ECHO/ LVEF: 60-65%  Clinical Course:  Past Medical History:  Diagnosis Date   Allergic rhinitis    ALLERGIC RHINITIS 05/08/2007   Qualifier: Diagnosis of  By: Linna Darner, CMA, Cindy     Allergy    seasonal   Anxiety    Anxiety state 05/25/2014   Aortic valve disease    a. ? possible bicuspid AV.   CAD (coronary artery disease)    a. nonobstructive by prior cath   Cataract    bilateral   Chronic low back pain 03/09/2016   Complication of anesthesia 2003   Medication for block "went up to my brain and I stopped breathing"   COPD (chronic obstructive pulmonary disease) (HCC)    COPD (chronic obstructive pulmonary disease) with chronic bronchitis (HCC) 10/12/2014   Depression    Depression, major, single episode, moderate (HCC) 05/08/2007   Qualifier: Diagnosis of  By: Linna Darner, CMA, Cindy     Dexamethasone adverse  reaction    2002 normal   Dilated aortic root (HCC)    a. mild dilated aortic root on prior echo, ascending aorta prominence on CT 2015.   Duodenal mass    GERD 05/08/2007   Qualifier: Diagnosis of  By: Linna Darner, CMA, Cindy     Hyperlipidemia    Hypertension    Insomnia    Neck pain, chronic 03/09/2016   NICOTINE ADDICTION 05/08/2007   Qualifier: Diagnosis of  By: Clent Ridges MD, Jeannett Senior A    Osteoarthritis    Overactive bladder    Pneumonia    once or twivce in past   Premature ventricular contractions    LBBB inferior axis PVCs   Thoracic back pain 05/30/2014   Tobacco abuse    Ulcer    duodenal     Social History   Socioeconomic History   Marital status: Single    Spouse name: Not on file   Number of children: 0   Years of education: Not on file   Highest education level: Not on file  Occupational History   Occupation: CNA  Tobacco Use   Smoking status: Former    Current packs/day: 0.00    Average packs/day: 0.5 packs/day for 54.0 years (24.3 ttl pk-yrs)    Types: Cigarettes    Start date: 01/02/1963    Quit date: 01/01/2017    Years since quitting: 6.4   Smokeless tobacco: Never   Tobacco comments:    1/2 pack per day or  less  Vaping Use   Vaping status: Never Used  Substance and Sexual Activity   Alcohol use: No    Alcohol/week: 0.0 standard drinks of alcohol   Drug use: No   Sexual activity: Not on file  Other Topics Concern   Not on file  Social History Narrative   Works as Lawyer at [employer].   Social Drivers of Corporate investment banker Strain: Low Risk  (05/14/2023)   Overall Financial Resource Strain (CARDIA)    Difficulty of Paying Living Expenses: Not hard at all  Food Insecurity: No Food Insecurity (06/19/2023)   Hunger Vital Sign    Worried About Running Out of Food in the Last Year: Never true    Ran Out of Food in the Last Year: Never true  Transportation Needs: No Transportation Needs (06/19/2023)   PRAPARE - Scientist, research (physical sciences) (Medical): No    Lack of Transportation (Non-Medical): No  Physical Activity: Inactive (05/14/2023)   Exercise Vital Sign    Days of Exercise per Week: 0 days    Minutes of Exercise per Session: 0 min  Stress: No Stress Concern Present (05/14/2023)   [employer] of Occupational Health - Occupational Stress Questionnaire    Feeling of Stress : Not at all  Social Connections: Socially Isolated (06/19/2023)   Social Connection and Isolation Panel [NHANES]    Frequency of Communication with Friends and Family: Once a week    Frequency of Social Gatherings with Friends and Family: Never    Attends Religious Services: Never    Database administrator or Organizations: No    Attends Engineer, structural: Never    Marital Status: Never married   Education Assessment and Provision:  Detailed education and instructions provided on heart failure disease management including the following:  Signs and symptoms of Heart Failure When to call the physician Importance of daily weights Low sodium diet Fluid restriction Medication management Anticipated future follow-up appointments  Patient education given on each of the above topics.  Patient acknowledges understanding via teach back method and acceptance of all instructions.  Education Materials:  "Living Better With Heart Failure" Booklet, HF zone tool, & Daily Weight Tracker Tool.  Patient has scale at home: No, give one too at HF TOC.  Patient has pill box at home: NA    High Risk Criteria for Readmission and/or Poor Patient Outcomes: Heart failure hospital admissions (last 6 months): 1  No Show rate: 8 %  Difficult social situation: Lives alone, at discharge will go to SNF for Rehab, plan to go home after.  Demonstrates medication adherence: Yes Primary Language: English Literacy level: Reading, writing, and comprehension  Barriers of Care:   Diet/ fluid amounts ( eats mainly frozen food meals)  Daily  weights ( needs a scale)   Considerations/Referrals:   Referral made to Heart Failure Pharmacist Stewardship: Yes Referral made to Heart Failure CSW/NCM TOC: No,  Referral made to Heart & Vascular TOC clinic: Yes, 07/10/2023 @ 11 am.   Items for Follow-up on DC/TOC: Continued HF education Diet/ fluid restrictions ( frozen meals) Daily weights   Rhae Hammock, BSN, RN Heart Failure Teacher, adult education Only

## 2023-06-26 NOTE — Progress Notes (Signed)
 SATURATION QUALIFICATIONS: (This note is used to comply with regulatory documentation for home oxygen)  Patient Saturations on Room Air at Rest = 90 %  Patient Saturations on Room Air while Ambulating = 83 %  Patient Saturations on 2 Liters of oxygen while Ambulating = 86%  Patient c/o nausea and the feeling of passing out while standing up. She also endorses shortness of breath  and wheezing during ambulation,  MD aware.

## 2023-06-26 NOTE — TOC Progression Note (Signed)
 Transition of Care [facility]) - Progression Note    Patient Details  Name: [name] [name] MRN: [id number] Date of Birth: June 24, 1950  [facility] CM/SW Contact  Leone Haven, RN Phone Number: 06/26/2023, 10:17 AM  Clinical Narrative:     Per CSW patient wants [facility] SNF now, CSW following.    Expected Discharge Plan: Skilled Nursing Facility Barriers to Discharge: Continued Medical Work up, SNF Pending bed offer, Insurance Authorization  Expected Discharge Plan and Services In-house Referral: Clinical Social Work     Living arrangements for the past 2 months: Single Family Home                                       Social Determinants of Health (SDOH) Interventions SDOH Screenings   Food Insecurity: No Food Insecurity (06/19/2023)  Housing: Low Risk  (06/19/2023)  Transportation Needs: No Transportation Needs (06/19/2023)  Utilities: Not At Risk (06/19/2023)  Alcohol Screen: Low Risk  (05/14/2023)  Depression (PHQ2-9): Low Risk  (05/14/2023)  Recent Concern: Depression (PHQ2-9) - High Risk (05/14/2023)  Financial Resource Strain: Low Risk  (05/14/2023)  Physical Activity: Inactive (05/14/2023)  Social Connections: Socially Isolated (06/19/2023)  Stress: No Stress Concern Present (05/14/2023)  Tobacco Use: Medium Risk (06/18/2023)  Health Literacy: Adequate Health Literacy (05/14/2023)    Readmission Risk Interventions     No data to display

## 2023-06-26 NOTE — Progress Notes (Addendum)
 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  On presentation ,she was noted to be tachypneic with increased work of breathing, had to put on BiPAP.  COVID/flu/RSV negative.  Chest lower lobe bilateral lower lobe airspace opacities.  CTA chest ruled out PE.  BNP was elevated.  Patient was admitted for the management of acute on chronic HFpEF.  Cardiology was consulted and following.  PT /OT  recommending SNF on discharge.  Assessment & Plan:  Principal Problem:   Acute respiratory failure with hypoxia (HCC) Active Problems:   Hyperlipemia, mixed   Depression, major, single episode, moderate (HCC)   Essential hypertension   COPD (chronic obstructive pulmonary disease) with chronic bronchitis (HCC)   Coronary artery disease involving native coronary artery of native heart with angina pectoris (HCC)   Chronic kidney disease, stage 3a (HCC)   Acute on chronic heart failure with preserved ejection fraction (HFpEF) (HCC)   Acute hypoxic respiratory failure secondary to CHF exacerbation:   Hypoxic on arrival, had to put on BiPAP, now weaned to room air.  Continue to wean the oxygen.  CTA negative for PE.  Chest x-ray showed lower lobe opacities likely from pulm edema.  Continue bronchodilators as needed, incentive spirometer.  On 2 L of oxygen this morning.  We are trying to wean but most likely she will qualify for home oxygen.  Acute on chronic HFpEF/pulmonary hypertension: Presented with bilateral lower extremity edema, dyspnea on exertion.elevated BNP.  She was on IV Lasix.  Echocardiogram done here showed  preserved EF, grade 1 diastolic dysfunction, pulmonary hypertension.  Cardiology did cardiac cath which showed diffuse coronary disease but no target for PCI.  Current plan is for medical management.  Also found to have moderate pulmonary hypertension.  She should follow-up with cardiology as an outpatient for management of pulmonary hypertension.  Started on low-dose Lasix  COPD: Currently without wheezing.  Continue bronchodilators as needed  Coronary artery disease/elevated troponin: Declines any chest pain at present.  Troponins mildly elevated.  Echo did not show any wall motion abnormality. cardiac cath which showed diffuse coronary disease but no target for PCI.  Current plan is for medical management.  Pulmonary nodules: Seen on CT.  Follow-up recommended with CT chest in a year  History of frequent symptomatic PVCs: On flecainide, Toprol at home.  Flecainide stopped.  Continue metoprolol, mexiletine.  EKG monitor was showing ventricular trigeminy  History of hypertension: Hospital course remarkable for hypotension.  She was on metoprolol, amlodipine, lisinopril at home.these meds were held due to hypotension, given IV fluids. Currently blood pressure stable.  Restarted metoprolol  AKI on CKD stage IIIb: Baseline creatinine around 1.1-1.5.  Currently kidney function at baseline.  Hyperlipidemia: Currently not on statin  Depression/anxiety: On Abilify  Hypomagnesemia: Supplemented magnesium  Debility/deconditioning: Patient seen by PT/OT and recommended SNF on discharge.  TOC following        DVT prophylaxis:enoxaparin (LOVENOX) injection 40 mg Start: 06/23/23 1000     Code Status: Do not attempt resuscitation (DNR) PRE-ARREST INTERVENTIONS DESIRED  Family Communication: None at the bedside  Patient status:Inpatient  Patient is from :Home  Anticipated discharge to:SNF  Estimated DC date:1-2 days   Consultants:  Cardiology  Procedures:Cardiac cath  Antimicrobials:   Anti-infectives (From admission, onward)    None       Subjective: Patient seen and examined at bedside today.  Hemodynamically stable.  She says she is agreeable for SNF.  On 2 L of oxygen.  She desaturated while attempting to wean.  Denies any worsening shortness of breath cough  Objective: Vitals:   06/26/23 0018 06/26/23 0353 06/26/23 0709 06/26/23 1052  BP: 127/73 119/66 106/60 138/72  Pulse: 85  87 85  Resp: 19 20 19 18   Temp: (P) 98.3 F (36.8 C) 98.2 F (36.8 C) 98.2 F (36.8 C) 98 F (36.7 C)  TempSrc: (P) Oral Oral Oral Oral  SpO2: 96% 95% 95% 94%  Weight:  (P) 88.7 kg    Height:        Intake/Output Summary (Last 24 hours) at 06/26/2023 1111 Last data filed at 06/26/2023 0857 Gross per 24 hour  Intake 460 ml  Output 2650 ml  Net -2190 ml   Filed Weights   06/23/23 0400 06/24/23 0428 06/26/23 0353  Weight: 90.2 kg 89.4 kg (P) 88.7 kg    Examination:  General exam: Overall comfortable, not in distress HEENT: PERRL Respiratory system: Diminished sounds on bases, faint crackles Cardiovascular system: S1 & S2 heard, RRR.  Gastrointestinal system: Abdomen is nondistended, soft and nontender. Central nervous system: Alert and oriented Extremities: Bilateral lower extremity nonpitting edema, no clubbing ,no cyanosis Skin: No rashes, no ulcers,no icterus     Data Reviewed: I have personally reviewed following labs and imaging studies  CBC: Recent Labs  Lab 06/22/23 0305 06/22/23 1641 06/22/23 1647  WBC 8.4  --   --   NEUTROABS 4.8  --   --   HGB 12.8 13.6 13.6  HCT 41.3 40.0 40.0  MCV 97.4  --   --   PLT 299  --   --    Basic Metabolic Panel: Recent Labs  Lab 06/20/23 0246 06/21/23 0244 06/22/23 0305 06/22/23 1641 06/22/23 1647 06/23/23 0246 06/26/23 0303  NA 139 138 141 141 141 139 140  K 4.9 4.4 4.5 4.3 4.2 4.5 4.1  CL 97* 97* 104  --   --  106 100  CO2 35* 32 29  --   --  27 31  GLUCOSE 108* 105* 125*  --   --  103* 98  BUN 36* 47*  50*  --   --  30* 22  CREATININE 1.58* 1.72* 1.50*  --   --  1.18* 1.06*  CALCIUM 8.7* 8.6* 8.0*  --   --  8.0* 8.6*  MG  --   --   --   --   --   --  1.6*     Recent Results (from the past 240 hours)  Resp panel by RT-PCR (RSV, Flu A&B, Covid) Anterior Nasal Swab     Status: None   Collection Time: 06/18/23  3:57 PM   Specimen: Anterior Nasal Swab  Result Value Ref Range Status   SARS Coronavirus 2 by RT PCR NEGATIVE NEGATIVE Final   Influenza A by PCR NEGATIVE NEGATIVE Final   Influenza B by PCR NEGATIVE NEGATIVE Final    Comment: (NOTE) The Xpert Xpress SARS-CoV-2/FLU/RSV plus assay is intended as an aid in the diagnosis of influenza from Nasopharyngeal swab specimens and should not be used as a sole basis for treatment. Nasal washings and aspirates are unacceptable for Xpert Xpress SARS-CoV-2/FLU/RSV testing.  Fact Sheet for Patients: BloggerCourse.com  Fact  Sheet for Healthcare Providers: SeriousBroker.it  This test is not yet approved or cleared by the [place] and has been authorized for detection and/or diagnosis of SARS-CoV-2 by FDA under an Emergency Use Authorization (EUA). This EUA will remain in effect (meaning this test can be used) for the duration of the COVID-19 declaration under Section 564(b)(1) of the Act, 21 U.S.C. section 360bbb-3(b)(1), unless the authorization is terminated or revoked.     Resp Syncytial Virus by PCR NEGATIVE NEGATIVE Final    Comment: (NOTE) Fact Sheet for Patients: BloggerCourse.com  Fact Sheet for Healthcare Providers: SeriousBroker.it  This test is not yet approved or cleared by the [place] FDA and has been authorized for detection and/or diagnosis of SARS-CoV-2 by FDA under an Emergency Use Authorization (EUA). This EUA will remain in effect (meaning this test can be used) for the duration of the COVID-19  declaration under Section 564(b)(1) of the Act, 21 U.S.C. section 360bbb-3(b)(1), unless the authorization is terminated or revoked.  Performed at [facility], [address], [place], [place] [address]      Radiology Studies: No results found.   Scheduled Meds:  ARIPiprazole  2 mg Oral Daily   aspirin EC  81 mg Oral Daily   atorvastatin  20 mg Oral Daily   Chlorhexidine Gluconate Cloth  6 each Topical Q0600   enoxaparin (LOVENOX) injection  40 mg Subcutaneous Q24H   furosemide  20 mg Oral Daily   metoprolol succinate  25 mg Oral Daily   mexiletine  150 mg Oral Q12H   polyethylene glycol  17 g Oral Daily   senna-docusate  1 tablet Oral BID   sodium chloride flush  3 mL Intravenous Q12H   sodium chloride flush  3 mL Intravenous Q12H   Continuous Infusions:  promethazine (PHENERGAN) injection (IM or IVPB)       LOS: 7 days   Burnadette Pop, MD [facility] P3/25/2025, 11:11 AM  PROGRESS NOTE  Silas Sedam  PIR:[id number] DOB: [birth year] DOA: 06/18/2023 PCP: [name], MD   Brief Narrative:   Assessment & Plan:  Principal Problem:   Acute respiratory failure with hypoxia (HCC) Active Problems:   Hyperlipemia, mixed   Depression, major, single episode, moderate (HCC)   Essential hypertension   COPD (chronic obstructive pulmonary disease) with chronic bronchitis (HCC)   Coronary artery disease involving native coronary artery of native heart with angina pectoris (HCC)   Chronic kidney disease, stage 3a (HCC)   Acute on chronic heart failure with preserved ejection fraction (HFpEF) (HCC)           DVT prophylaxis:enoxaparin (LOVENOX) injection 40 mg Start: 06/23/23 1000     Code Status: Do not attempt resuscitation (DNR) PRE-ARREST INTERVENTIONS DESIRED  Family Communication:   Patient status:  Patient is from :  Anticipated discharge to:  Estimated DC date:   Consultants:   Procedures:  Antimicrobials:   Anti-infectives (From admission, onward)    None       Subjective:   Objective: Vitals:   06/26/23 0018 06/26/23 0353 06/26/23 0709 06/26/23 1052  BP: 127/73 119/66 106/60 138/72  Pulse: 85  87 85  Resp: 19 20 19 18   Temp: (P) 98.3 F (36.8 C) 98.2 F (36.8 C) 98.2 F (36.8 C) 98 F (36.7 C)  TempSrc: (P) Oral Oral Oral Oral  SpO2: 96% 95% 95% 94%  Weight:  (P) 88.7 kg    Height:        Intake/Output Summary (  Last 24 hours) at 06/26/2023 1111 Last data filed at 06/26/2023 0857 Gross per 24 hour  Intake 460 ml  Output 2650 ml  Net -2190 ml   Filed Weights   06/23/23 0400 06/24/23 0428 06/26/23 0353  Weight: 90.2 kg 89.4 kg (P) 88.7 kg    Examination:  General exam: Overall comfortable, not in distress HEENT: PERRL Respiratory system:  no wheezes or crackles  Cardiovascular system: S1 & S2 heard, RRR.  Gastrointestinal system: Abdomen is nondistended, soft and nontender. Central nervous system: Alert and oriented Extremities: No edema, no clubbing ,no cyanosis Skin: No rashes, no ulcers,no icterus     Data Reviewed: I have personally reviewed following labs and imaging studies  CBC: Recent Labs  Lab 06/22/23 0305 06/22/23 1641 06/22/23 1647  WBC 8.4  --   --   NEUTROABS 4.8  --   --   HGB 12.8 13.6 13.6  HCT 41.3 40.0 40.0  MCV 97.4  --   --   PLT 299  --   --    Basic Metabolic Panel: Recent Labs  Lab 06/20/23 0246 06/21/23 0244 06/22/23 0305 06/22/23 1641 06/22/23 1647 06/23/23 0246 06/26/23 0303  NA 139 138 141 141 141 139 140  K 4.9 4.4 4.5 4.3 4.2 4.5 4.1  CL 97* 97* 104  --   --  106 100  CO2 35* 32 29  --   --  27 31  GLUCOSE 108* 105* 125*  --   --  103* 98  BUN 36* 47* 50*  --   --  30* 22  CREATININE 1.58* 1.72* 1.50*  --   --  1.18* 1.06*  CALCIUM 8.7* 8.6* 8.0*  --   --  8.0* 8.6*  MG  --   --   --   --   --   --  1.6*     Recent Results (from the past 240 hours)  Resp panel by RT-PCR (RSV, Flu A&B, Covid) Anterior  Nasal Swab     Status: None   Collection Time: 06/18/23  3:57 PM   Specimen: Anterior Nasal Swab  Result Value Ref Range Status   SARS Coronavirus 2 by RT PCR NEGATIVE NEGATIVE Final   Influenza A by PCR NEGATIVE NEGATIVE Final   Influenza B by PCR NEGATIVE NEGATIVE Final    Comment: (NOTE) The Xpert Xpress SARS-CoV-2/FLU/RSV plus assay is intended as an aid in the diagnosis of influenza from Nasopharyngeal swab specimens and should not be used as a sole basis for treatment. Nasal washings and aspirates are unacceptable for Xpert Xpress SARS-CoV-2/FLU/RSV testing.  Fact Sheet for Patients: BloggerCourse.com  Fact Sheet for Healthcare Providers: SeriousBroker.it  This test is not yet approved or cleared by the [place] FDA and has been authorized for detection and/or diagnosis of SARS-CoV-2 by FDA under an Emergency Use Authorization (EUA). This EUA will remain in effect (meaning this test can be used) for the duration of the COVID-19 declaration under Section 564(b)(1) of the Act, 21 U.S.C. section 360bbb-3(b)(1), unless the authorization is terminated or revoked.     Resp Syncytial Virus by PCR NEGATIVE NEGATIVE Final    Comment: (NOTE) Fact Sheet for Patients: BloggerCourse.com  Fact Sheet for Healthcare Providers: SeriousBroker.it  This test is not yet approved or cleared by the [place] FDA and has been authorized for detection and/or diagnosis of SARS-CoV-2 by FDA under an Emergency Use Authorization (EUA). This EUA will remain in effect (meaning this test can be used) for  the duration of the COVID-19 declaration under Section 564(b)(1) of the Act, 21 U.S.C. section 360bbb-3(b)(1), unless the authorization is terminated or revoked.  Performed at [facility], [address] [place], [place] [address]      Radiology Studies: No results  found.   Scheduled Meds:  ARIPiprazole  2 mg Oral Daily   aspirin EC  81 mg Oral Daily   atorvastatin  20 mg Oral Daily   Chlorhexidine Gluconate Cloth  6 each Topical Q0600   enoxaparin (LOVENOX) injection  40 mg Subcutaneous Q24H   furosemide  20 mg Oral Daily   metoprolol succinate  25 mg Oral Daily   mexiletine  150 mg Oral Q12H   polyethylene glycol  17 g Oral Daily   senna-docusate  1 tablet Oral BID   sodium chloride flush  3 mL Intravenous Q12H   sodium chloride flush  3 mL Intravenous Q12H   Continuous Infusions:  promethazine (PHENERGAN) injection (IM or IVPB)       LOS: 7 days   Burnadette Pop, MD [facility] P3/25/2025, 11:11 AM  PROGRESS NOTE  Lamonica Trueba  UEA:[id number] DOB: 1950/11/15 DOA: 06/18/2023 PCP: [name], MD   Brief Narrative:   Assessment & Plan:  Principal Problem:   Acute respiratory failure with hypoxia (HCC) Active Problems:   Hyperlipemia, mixed   Depression, major, single episode, moderate (HCC)   Essential hypertension   COPD (chronic obstructive pulmonary disease) with chronic bronchitis (HCC)   Coronary artery disease involving native coronary artery of native heart with angina pectoris (HCC)   Chronic kidney disease, stage 3a (HCC)   Acute on chronic heart failure with preserved ejection fraction (HFpEF) (HCC)           DVT prophylaxis:enoxaparin (LOVENOX) injection 40 mg Start: 06/23/23 1000     Code Status: Do not attempt resuscitation (DNR) PRE-ARREST INTERVENTIONS DESIRED  Family Communication:   Patient status:  Patient is from :  Anticipated discharge to:  Estimated DC date:   Consultants:   Procedures:  Antimicrobials:  Anti-infectives (From admission, onward)    None       Subjective:   Objective: Vitals:   06/26/23 0018 06/26/23 0353 06/26/23 0709 06/26/23 1052  BP: 127/73 119/66 106/60 138/72  Pulse: 85  87 85  Resp: 19 20 19 18   Temp: (P) 98.3 F (36.8 C) 98.2 F  (36.8 C) 98.2 F (36.8 C) 98 F (36.7 C)  TempSrc: (P) Oral Oral Oral Oral  SpO2: 96% 95% 95% 94%  Weight:  (P) 88.7 kg    Height:        Intake/Output Summary (Last 24 hours) at 06/26/2023 1111 Last data filed at 06/26/2023 0857 Gross per 24 hour  Intake 460 ml  Output 2650 ml  Net -2190 ml   Filed Weights   06/23/23 0400 06/24/23 0428 06/26/23 0353  Weight: 90.2 kg 89.4 kg (P) 88.7 kg    Examination:  General exam: Overall comfortable, not in distress HEENT: PERRL Respiratory system:  no wheezes or crackles  Cardiovascular system: S1 & S2 heard, RRR.  Gastrointestinal system: Abdomen is nondistended, soft and nontender. Central nervous system: Alert and oriented Extremities: No edema, no clubbing ,no cyanosis Skin: No rashes, no ulcers,no icterus     Data Reviewed: I have personally reviewed following labs and imaging studies  CBC: Recent Labs  Lab 06/22/23 0305 06/22/23 1641 06/22/23 1647  WBC 8.4  --   --   NEUTROABS 4.8  --   --  HGB 12.8 13.6 13.6  HCT 41.3 40.0 40.0  MCV 97.4  --   --   PLT 299  --   --    Basic Metabolic Panel: Recent Labs  Lab 06/20/23 0246 06/21/23 0244 06/22/23 0305 06/22/23 1641 06/22/23 1647 06/23/23 0246 06/26/23 0303  NA 139 138 141 141 141 139 140  K 4.9 4.4 4.5 4.3 4.2 4.5 4.1  CL 97* 97* 104  --   --  106 100  CO2 35* 32 29  --   --  27 31  GLUCOSE 108* 105* 125*  --   --  103* 98  BUN 36* 47* 50*  --   --  30* 22  CREATININE 1.58* 1.72* 1.50*  --   --  1.18* 1.06*  CALCIUM 8.7* 8.6* 8.0*  --   --  8.0* 8.6*  MG  --   --   --   --   --   --  1.6*     Recent Results (from the past 240 hours)  Resp panel by RT-PCR (RSV, Flu A&B, Covid) Anterior Nasal Swab     Status: None   Collection Time: 06/18/23  3:57 PM   Specimen: Anterior Nasal Swab  Result Value Ref Range Status   SARS Coronavirus 2 by RT PCR NEGATIVE NEGATIVE Final   Influenza A by PCR NEGATIVE NEGATIVE Final   Influenza B by PCR NEGATIVE NEGATIVE  Final    Comment: (NOTE) The Xpert Xpress SARS-CoV-2/FLU/RSV plus assay is intended as an aid in the diagnosis of influenza from Nasopharyngeal swab specimens and should not be used as a sole basis for treatment. Nasal washings and aspirates are unacceptable for Xpert Xpress SARS-CoV-2/FLU/RSV testing.  Fact Sheet for Patients: BloggerCourse.com  Fact Sheet for Healthcare Providers: SeriousBroker.it  This test is not yet approved or cleared by the [place] FDA and has been authorized for detection and/or diagnosis of SARS-CoV-2 by FDA under an Emergency Use Authorization (EUA). This EUA will remain in effect (meaning this test can be used) for the duration of the COVID-19 declaration under Section 564(b)(1) of the Act, 21 U.S.C. section 360bbb-3(b)(1), unless the authorization is terminated or revoked.     Resp Syncytial Virus by PCR NEGATIVE NEGATIVE Final    Comment: (NOTE) Fact Sheet for Patients: BloggerCourse.com  Fact Sheet for Healthcare Providers: SeriousBroker.it  This test is not yet approved or cleared by the [place] FDA and has been authorized for detection and/or diagnosis of SARS-CoV-2 by FDA under an Emergency Use Authorization (EUA). This EUA will remain in effect (meaning this test can be used) for the duration of the COVID-19 declaration under Section 564(b)(1) of the Act, 21 U.S.C. section 360bbb-3(b)(1), unless the authorization is terminated or revoked.  Performed at [facility], [address], [place], [place] [address]      Radiology Studies: No results found.   Scheduled Meds:  ARIPiprazole  2 mg Oral Daily   aspirin EC  81 mg Oral Daily   atorvastatin  20 mg Oral Daily   Chlorhexidine Gluconate Cloth  6 each Topical Q0600   enoxaparin (LOVENOX) injection  40 mg Subcutaneous Q24H   furosemide  20 mg Oral Daily   metoprolol  succinate  25 mg Oral Daily   mexiletine  150 mg Oral Q12H   polyethylene glycol  17 g Oral Daily   senna-docusate  1 tablet Oral BID   sodium chloride flush  3 mL Intravenous Q12H   sodium chloride  flush  3 mL Intravenous Q12H   Continuous Infusions:  promethazine (PHENERGAN) injection (IM or IVPB)       LOS: 7 days   Burnadette Pop, MD [facility] P3/25/2025, 11:11 AM

## 2023-06-26 NOTE — Consult Note (Signed)
 [facility] Liaison Consult Note   06/26/2023  Rahcel Shutes [facility] 1950-08-10 [id number]  [facility] [[facility]] Consult:  Discussion in unit progression meeting   Primary Care Provider:  [name], MD, with [place] at [facility] which is listed for the Transition of Care follow up and calls.   Insurance: [employer]  Patient was reviewed for 7 day LOS  medium risk score for barriers to care when returning to community.  [facility] Liaison coverage review for patient admitted to [facility] and met patient at the bedside. Patient verbalizes feeling poorly today with nausea and dizziness.  Patient has a person renting a room from her but is not a caregiver.  Patient was screened for hospitalization and on behalf of [facility] for RN Care Coordination to assess for post hospital community care needs.  Patient is being considered for a skilled nursing facility level of care for post hospital transition.  Patient will need insurance authorization for SNF rehab.  Plan:  If transitions to affiliated facility, the [facility] RN can be notified and follow for any known needs for transitional care needs for returning to post facility care coordination needs for returning to community.  For questions or referrals, please contact:  Charlesetta Shanks, RN, BSN, CCM [place]  [facility], [facility] Liaison Direct Dial: [phone] or secure chat Email: [email or web address]

## 2023-06-26 NOTE — Progress Notes (Signed)
 Heart Failure Stewardship Pharmacist Progress Note   PCP: [name], MD PCP-Cardiologist: Verne Carrow, MD    HPI:  73 y.o. female with PMHx of RA, GERD, allergic rhinitis, osteoarthritis, HLD, HTN, COPD, CAD, CKD (stage 3a), and depression/anxiety.  She presented to ED on 03/17 with complaints of worsening SOB. Patient reported she is typically SOB and has a non-productive cough at baseline due to COPD, but 3 days ago she noticed DOE with minimal activity. She also reported lower extremity swelling.  Initial vitals were 152/93, HR 84, RR 32. She was given 6 L O2 via [place] and later transitioned to BiPAP. BNP 715.9. She was given Lasix IV 40 mg prior to admission. CXR on 03/17 showed bilateral lower lobe airspace opacities could reflect atelectasis or pneumonia. Chest CT on  03/17 revealed no evidence of pulmonary embolism, scattered atelectasis or scarring bilaterally, small bilateral pleural effusions, scattered pulmonary nodules not significantly changed from 2022, multi-vessel coronary artery calcifications, aortic arthrosclerosis with aneurysmal dilation of the ascending aorta. She does follow cardiology for CAD. Her SO recently passed ~2 weeks ago. ECHO on 03/18 revealed LVEF of 60-65% (unchanged from 2020), LV has normal function, no regional wall abnormalities, LV G1 DD, signs of RV pressure and volume overload, MV is normal in structure, and right atrial pressure of 3 mmHg.   R/L HC on 03/21 revealed moderate pulmonary HTN (PA pressure 45 mmHg), CO 4 L/min, CI 2.2, RA 13, and wedge 20 mmHg.   Patient was transitioned off IV lasix on 03/19. Lisinopril 10 mg and Amlodipine 5 mg discontinued on 03/19.   Today she states she is feeling very nauseous. She also complained of dry heaving, dizziness, and lightheadedness. She denied SOB and DOE. Bilateral lower extremity seen on exam (similar to yesterday). She was able to tolerate her medications, but has not been able to eat.  Promethazine 12.5 mg IV Q6H has been ordered.   Current HF Medications: Diuretic: Furosemide 20 mg PO QD Beta Blocker: Metoprolol succinate 25 mg PO every day Other: Magnesium sulfate 2 g IV  Prior to admission HF Medications: Beta blocker: Metoprolol succinate 50 mg every day (last dispensed 05/2023 for 90 ds) ACE/ARB/ARNI: Lisinopril 10 mg every day (last dispensed 05/2023 for 90 ds) Other: Amlodipine 5 mg every day (last dispensed 06/2023 for 90 ds) and Imdur 30 mg every day (patient reported not taking; last dispensed 06/2023 for 90 ds)  Pertinent Lab Values Serum creatinine: 1.06, BUN 22, Potassium 4.1, Sodium 140, BNP 715.9, Magnesium 1.6, A1c 5.8 (05/2023)  Vital Signs: Weight: 195.6 lbs (admission weight: 190.9 lbs<- likely a guess) bed wt Blood pressure: 113/57-142/68 (106/60 @ 0709) Heart rate: 85-102 (87 @ 0709) I/O: net -2.1 L yesterday; net -3.4 L since admission  Medication Assistance / Insurance Benefits Check: Does the patient have prescription insurance?  Yes Type of insurance plan: UHC Medicare  Does the patient qualify for medication assistance through manufacturers or grants?   Yes  Outpatient Pharmacy:  Prior to admission outpatient pharmacy:  [id number] - [place], [place] - [address]   Is the patient willing to use [facility] TOC pharmacy at discharge? Yes Is the patient willing to transition their outpatient pharmacy to utilize a [facility] outpatient pharmacy?   No    Assessment: 1. Acute on chronic HFpEF (LVEF 60-65%). NYHA class I symptoms. -Strict I/Os and daily weights. Keep K>4, Mg>2 -Continue Furosemide 20 mg every day -Continue metoprolol succinate at 25 mg every day -Consider starting  Spironolactone 12.5 mg every day  -Restart Lisinopril at 5 mg every day at discharge   Plan: 1) Medication changes recommended at this time: -Consider starting Spironolactone 12.5 mg every day  -Restart Lisinopril at 5 mg every day at discharge    2)  Patient assistance: -She is agreeable to using [facility] TOC pharmacy at discharge  3)  Education  - To be completed prior to discharge  Sofie Rower, PharmD [employer] PGY-1

## 2023-06-27 DIAGNOSIS — I5033 Acute on chronic diastolic (congestive) heart failure: Secondary | ICD-10-CM | POA: Diagnosis not present

## 2023-06-27 DIAGNOSIS — J9601 Acute respiratory failure with hypoxia: Secondary | ICD-10-CM | POA: Diagnosis not present

## 2023-06-27 LAB — BASIC METABOLIC PANEL
Anion gap: 10 (ref 5–15)
BUN: 25 mg/dL — ABNORMAL HIGH (ref 8–23)
CO2: 31 mmol/L (ref 22–32)
Calcium: 8.9 mg/dL (ref 8.9–10.3)
Chloride: 97 mmol/L — ABNORMAL LOW (ref 98–111)
Creatinine, Ser: 1.16 mg/dL — ABNORMAL HIGH (ref 0.44–1.00)
GFR, Estimated: 49 mL/min — ABNORMAL LOW (ref >60)
Glucose, Bld: 98 mg/dL (ref 70–99)
Potassium: 4.1 mmol/L (ref 3.5–5.1)
Sodium: 138 mmol/L (ref 135–145)

## 2023-06-27 LAB — MAGNESIUM: Magnesium: 2.2 mg/dL (ref 1.7–2.4)

## 2023-06-27 MED ORDER — SPIRONOLACTONE 25 MG PO TABS
25.0000 mg | ORAL_TABLET | Freq: Every day | ORAL | Status: DC
  Administered 2023-06-28 – 2023-07-06 (×9): 25 mg via ORAL
  Filled 2023-06-27 (×9): qty 1

## 2023-06-27 MED ORDER — ALUM & MAG HYDROXIDE-SIMETH 200-200-20 MG/5ML PO SUSP
30.0000 mL | ORAL | Status: DC | PRN
  Administered 2023-06-27 – 2023-06-29 (×3): 30 mL via ORAL
  Filled 2023-06-27 (×3): qty 30

## 2023-06-27 NOTE — Progress Notes (Signed)
 Occupational Therapy Treatment Patient Details Name: [name] [name] MRN: [id number] DOB: 1950-09-01 Today's Date: 06/27/2023   History of present illness Pt is a 73 y.o. F admitted 06/18/23 with SOB and associated hypoxia. CTA chest neg for PE, had small bilat pleural effusions and small nodules. Pt was diuresed, found to be hypotensive, and received 500 cc of fluid. 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   OT comments  Pt with slow progression toward established OT goals secondary to nausea. Asking for her xanax as well. RN on way, but not present after energy conservation education ~20 mins so pt refusing OOB. Provided education regarding 4 P's of energy conservation (pacing, planning, prioritizing, and positioning), pt receptive to this as well as additional handout regarding implementation of energy conservation strategies during ADL and OT facilitating meaningful conversation regarding implementation into pt's own context. Will continue to follow.       If plan is discharge home, recommend the following:  A little help with walking and/or transfers;A little help with bathing/dressing/bathroom;Assistance with cooking/housework;Assist for transportation;Help with stairs or ramp for entrance   Equipment Recommendations  Other (comment) (recommend tub seat; pt unable to afford items not covered by insurance)    Recommendations for Other Services      Precautions / Restrictions Precautions Precautions: Fall Recall of Precautions/Restrictions: Intact Restrictions Weight Bearing Restrictions Per Provider Order: No       Mobility Bed Mobility                    Transfers                         Balance                                           ADL either performed or assessed with clinical judgement   ADL Overall ADL's : Needs assistance/impaired Eating/Feeding: Set up;Sitting Eating/Feeding Details (indicate cue type  and reason): assist for cutting up her french toast Grooming: Set up;Sitting                                 General ADL Comments: focus of session on energy conservation education    Extremity/Trunk Assessment              Vision       Perception     Praxis     Communication     Cognition Arousal: Alert Behavior During Therapy: WFL for tasks assessed/performed               OT - Cognition Comments: education provided regarding energy conservation techniques with the 4 P's of energy conservation as well as functional application of energy conservation during ADl. Pt reporting how she uses some of these strategies at home and how she thinks they may be helpful                          Cueing      Exercises      Shoulder Instructions       General Comments c/o nausea and asking for her xanax on arrival. Initiated contact with RN who reports she is on her way but not present until ~30 mins later  and pt refusing to get OOB until xanax arrives    Pertinent Vitals/ Pain       Pain Assessment Pain Assessment: No/denies pain  Home Living                                          Prior Functioning/Environment              Frequency  Min 2X/week        Progress Toward Goals  OT Goals(current goals can now be found in the care plan section)  Progress towards OT goals: Progressing toward goals (slowly this session secondary to ongoing nausea)  Acute Rehab OT Goals OT Goal Formulation: With patient Time For Goal Achievement: 07/05/23 Potential to Achieve Goals: Good ADL Goals Pt Will Perform Grooming: with supervision;standing Pt Will Perform Lower Body Bathing: with supervision;sit to/from stand;with adaptive equipment Pt Will Perform Lower Body Dressing: with supervision;with adaptive equipment;sit to/from stand Pt Will Perform Toileting - Clothing Manipulation and hygiene: with supervision;sit to/from  stand Additional ADL Goal #1: Pt will generalize energy conservation and breathing strategies in ADLs and mobility. Additional ADL Goal #2: Pt will be aware of availability of tub transfer bench for increased safety during showering.  Plan      Co-evaluation                 AM-PAC OT "6 Clicks" Daily Activity     Outcome Measure   Help from another person eating meals?: None Help from another person taking care of personal grooming?: A Little Help from another person toileting, which includes using toliet, bedpan, or urinal?: A Little Help from another person bathing (including washing, rinsing, drying)?: A Little Help from another person to put on and taking off regular upper body clothing?: A Little Help from another person to put on and taking off regular lower body clothing?: A Little 6 Click Score: 19    End of Session Equipment Utilized During Treatment: Oxygen (2L)  OT Visit Diagnosis: Muscle weakness (generalized) (M62.81);Other symptoms and signs involving cognitive function   Activity Tolerance Patient limited by fatigue;Other (comment) (limited by nausea)   Patient Left in bed;with call bell/phone within reach;with bed alarm set   Nurse Communication Mobility status;Other (comment) (continues to ask for xanax)        Time: 1650-1710 OT Time Calculation (min): 20 min  Charges: OT General Charges $OT Visit: 1 Visit OT Treatments $Self Care/Home Management : 8-22 mins  Tyler Deis, OTR/L [facility] Acute Rehabilitation Office: [phone]   Myrla Halsted 06/27/2023, 5:18 PM

## 2023-06-27 NOTE — Care Management Important Message (Signed)
 Important Message  Patient Details  Name: [name] [name] MRN: [id number] Date of Birth: 1951/03/01   Important Message Given:  Yes - Medicare IM     Renie Ora 06/27/2023, 10:20 AM

## 2023-06-27 NOTE — Progress Notes (Signed)
 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  On presentation ,she was noted to be tachypneic with increased work of breathing, had to put on BiPAP.  COVID/flu/RSV negative.  Chest lower lobe bilateral lower lobe airspace opacities.  CTA chest ruled out PE.  BNP was elevated.  Patient was admitted for the management of acute on chronic HFpEF.  Cardiology was consulted and following.  PT /OT  recommending SNF on discharge.  Assessment & Plan:  Principal Problem:   Acute respiratory failure with hypoxia (HCC) Active Problems:   Hyperlipemia, mixed   Depression, major, single episode, moderate (HCC)   Essential hypertension   COPD (chronic obstructive pulmonary disease) with chronic bronchitis (HCC)   Coronary artery disease involving native coronary artery of native heart with angina pectoris (HCC)   Chronic kidney disease, stage 3a (HCC)   Acute on chronic heart failure with preserved ejection fraction (HFpEF) (HCC)   Acute hypoxic respiratory failure secondary to CHF exacerbation:   Hypoxic on arrival, had to put on BiPAP, now weaned to room air.  Continue to wean the oxygen.  CTA negative for PE.  Chest x-ray showed lower lobe opacities likely from pulm edema.  Continue bronchodilators as needed, incentive spirometer.  On 2 L of oxygen this morning.  We are trying to wean but most likely she will qualify for home oxygen.  Acute on chronic HFpEF/pulmonary hypertension: Presented with bilateral lower extremity edema, dyspnea on exertion.elevated BNP.  She was on IV Lasix.  Echocardiogram done here showed  preserved EF, grade 1 diastolic dysfunction, pulmonary hypertension.  Cardiology did cardiac cath which showed diffuse coronary disease but no target for PCI.  Current plan is for medical management.  Also found to have moderate pulmonary hypertension.  She should follow-up with cardiology as an outpatient for management of pulmonary hypertension.  Started on oral Lasix  COPD: Currently without wheezing.  Continue bronchodilators as needed  Coronary artery disease/elevated troponin: Declines any chest pain at present.  Troponins mildly elevated.  Echo did not show any wall motion abnormality. cardiac cath which showed diffuse coronary disease but no target for PCI.  Current plan is for medical management.  Pulmonary nodules: Seen on CT.  Follow-up recommended with CT chest in a year  History of frequent symptomatic PVCs: On flecainide, Toprol at home.  Flecainide stopped.  Continue metoprolol, mexiletine  History of hypertension: Hospital course remarkable for hypotension.  She was on metoprolol, amlodipine, lisinopril at home.these meds were held due to hypotension, given IV fluids. Currently blood pressure stable.  Restarted metoprolol  AKI on CKD stage IIIb: Baseline creatinine around 1.1-1.5.  Currently kidney function at baseline.  Hyperlipidemia: Currently not on statin  Depression/anxiety: On Abilify  Hypomagnesemia: Supplemented magnesium and corrected  Urinary retention: Will remove Foley today  Debility/deconditioning: Patient seen by PT/OT and recommended SNF on discharge.  TOC following        DVT prophylaxis:enoxaparin (LOVENOX) injection 40 mg Start: 06/23/23 1000     Code Status: Do not attempt resuscitation (DNR) PRE-ARREST INTERVENTIONS DESIRED  Family Communication: None at the bedside  Patient status:Inpatient  Patient is from :Home  Anticipated discharge to:SNF  Estimated DC date:1-2 days  Consultants: Cardiology  Procedures:Cardiac  cath  Antimicrobials:  Anti-infectives (From admission, onward)    None       Subjective:  Patient seen and examined at the bedside today.  Hemodynamically stable.  On 2 L of oxygen per minute.  Complains of some nausea today.  Last bowel movement was 2 days ago.  No abdominal pain.  Overall appears comfortable but complains of weakness and does not think she is ready to go to rehab today  Objective: Vitals:   06/26/23 2004 06/27/23 0054 06/27/23 0413 06/27/23 0819  BP: (!) 134/56 114/83 121/86 126/64  Pulse: 84 81 72 95  Resp: 20 20 18 20   Temp: (!) 97.5 F (36.4 C) 97.6 F (36.4 C) (!) 97.5 F (36.4 C) 97.6 F (36.4 C)  TempSrc: Oral Oral Oral Oral  SpO2: 94% 95% 96% 100%  Weight:   86.2 kg   Height:        Intake/Output Summary (Last 24 hours) at 06/27/2023 1053 Last data filed at 06/27/2023 0850 Gross per 24 hour  Intake 540 ml  Output 600 ml  Net -60 ml   Filed Weights   06/24/23 0428 06/26/23 0353 06/27/23 0413  Weight: 89.4 kg 88.7 kg 86.2 kg    Examination:  General exam: Overall comfortable, not in distress HEENT: PERRL Respiratory system: Mildly diminished air sounds on the bases Cardiovascular system: S1 & S2 heard, RRR.  Gastrointestinal system: Abdomen is nondistended, soft and nontender. Central nervous system: Alert and oriented Extremities: Bilateral lower EXTR nonpitting edema, no clubbing ,no cyanosis Skin: No rashes, no ulcers,no icterus     Data Reviewed: I have personally reviewed following labs and imaging studies  CBC: Recent Labs  Lab 06/22/23 0305 06/22/23 1641 06/22/23 1647  WBC 8.4  --   --   NEUTROABS 4.8  --   --   HGB 12.8 13.6 13.6  HCT 41.3 40.0 40.0  MCV 97.4  --   --   PLT 299  --   --    Basic Metabolic Panel: Recent Labs  Lab 06/21/23 0244 06/22/23 0305 06/22/23 1641 06/22/23 1647 06/23/23 0246 06/26/23 0303 06/27/23 0315  NA 138 141 141 141 139 140 138  K 4.4 4.5 4.3 4.2 4.5 4.1 4.1  CL 97* 104  --    --  106 100 97*  CO2 32 29  --   --  27 31 31   GLUCOSE 105* 125*  --   --  103* 98 98  BUN 47* 50*  --   --  30* 22 25*  CREATININE 1.72* 1.50*  --   --  1.18* 1.06* 1.16*  CALCIUM 8.6* 8.0*  --   --  8.0* 8.6* 8.9  MG  --   --   --   --   --  1.6* 2.2     Recent Results (from the past 240 hours)  Resp panel by RT-PCR (RSV, Flu A&B, Covid) Anterior Nasal Swab     Status: None   Collection Time: 06/18/23  3:57 PM   Specimen: Anterior Nasal Swab  Result Value Ref Range Status   SARS Coronavirus 2 by RT PCR NEGATIVE NEGATIVE Final   Influenza A by PCR NEGATIVE NEGATIVE Final   Influenza B by PCR NEGATIVE NEGATIVE Final    Comment: (NOTE) The Xpert Xpress SARS-CoV-2/FLU/RSV plus assay is intended as an aid in the diagnosis of influenza from Nasopharyngeal swab specimens and should not be used as a sole basis for treatment. Nasal  washings and aspirates are unacceptable for Xpert Xpress SARS-CoV-2/FLU/RSV testing.  Fact Sheet for Patients: BloggerCourse.com  Fact Sheet for Healthcare Providers: SeriousBroker.it  This test is not yet approved or cleared by the [place] FDA and has been authorized for detection and/or diagnosis of SARS-CoV-2 by FDA under an Emergency Use Authorization (EUA). This EUA will remain in effect (meaning this test can be used) for the duration of the COVID-19 declaration under Section 564(b)(1) of the Act, 21 U.S.C. section 360bbb-3(b)(1), unless the authorization is terminated or revoked.     Resp Syncytial Virus by PCR NEGATIVE NEGATIVE Final    Comment: (NOTE) Fact Sheet for Patients: BloggerCourse.com  Fact Sheet for Healthcare Providers: SeriousBroker.it  This test is not yet approved or cleared by the [place] FDA and has been authorized for detection and/or diagnosis of SARS-CoV-2 by FDA under an Emergency Use Authorization (EUA). This  EUA will remain in effect (meaning this test can be used) for the duration of the COVID-19 declaration under Section 564(b)(1) of the Act, 21 U.S.C. section 360bbb-3(b)(1), unless the authorization is terminated or revoked.  Performed at [facility], [address] [place], [place] [address]      Radiology Studies: No results found.   Scheduled Meds:  ARIPiprazole  2 mg Oral Daily   aspirin EC  81 mg Oral Daily   atorvastatin  20 mg Oral Daily   Chlorhexidine Gluconate Cloth  6 each Topical Q0600   enoxaparin (LOVENOX) injection  40 mg Subcutaneous Q24H   furosemide  40 mg Oral Daily   metoprolol succinate  25 mg Oral Daily   mexiletine  150 mg Oral Q12H   polyethylene glycol  17 g Oral Daily   senna-docusate  1 tablet Oral BID   sodium chloride flush  3 mL Intravenous Q12H   sodium chloride flush  3 mL Intravenous Q12H   spironolactone  12.5 mg Oral Daily   Continuous Infusions:  promethazine (PHENERGAN) injection (IM or IVPB)       LOS: 8 days   Burnadette Pop, MD [facility] P3/26/2025, 10:53 AM  PROGRESS NOTE  [name] [name]  MVH:[id number] DOB: 02-11-1951 DOA: 06/18/2023 PCP: [name], MD   Brief Narrative:   Assessment & Plan:  Principal Problem:   Acute respiratory failure with hypoxia (HCC) Active Problems:   Hyperlipemia, mixed   Depression, major, single episode, moderate (HCC)   Essential hypertension   COPD (chronic obstructive pulmonary disease) with chronic bronchitis (HCC)   Coronary artery disease involving native coronary artery of native heart with angina pectoris (HCC)   Chronic kidney disease, stage 3a (HCC)   Acute on chronic heart failure with preserved ejection fraction (HFpEF) (HCC)           DVT prophylaxis:enoxaparin (LOVENOX) injection 40 mg Start: 06/23/23 1000     Code Status: Do not attempt resuscitation (DNR) PRE-ARREST INTERVENTIONS DESIRED  Family Communication:   Patient status:  Patient  is from :  Anticipated discharge to:  Estimated DC date:   Consultants:   Procedures:  Antimicrobials:  Anti-infectives (From admission, onward)    None       Subjective:   Objective: Vitals:   06/26/23 2004 06/27/23 0054 06/27/23 0413 06/27/23 0819  BP: (!) 134/56 114/83 121/86 126/64  Pulse: 84 81 72 95  Resp: 20 20 18 20   Temp: (!) 97.5 F (36.4 C) 97.6 F (36.4 C) (!) 97.5 F (36.4 C) 97.6 F (36.4 C)  TempSrc: Oral Oral  Oral Oral  SpO2: 94% 95% 96% 100%  Weight:   86.2 kg   Height:        Intake/Output Summary (Last 24 hours) at 06/27/2023 1053 Last data filed at 06/27/2023 0850 Gross per 24 hour  Intake 540 ml  Output 600 ml  Net -60 ml   Filed Weights   06/24/23 0428 06/26/23 0353 06/27/23 0413  Weight: 89.4 kg 88.7 kg 86.2 kg    Examination:  General exam: Overall comfortable, not in distress HEENT: PERRL Respiratory system:  no wheezes or crackles  Cardiovascular system: S1 & S2 heard, RRR.  Gastrointestinal system: Abdomen is nondistended, soft and nontender. Central nervous system: Alert and oriented Extremities: No edema, no clubbing ,no cyanosis Skin: No rashes, no ulcers,no icterus     Data Reviewed: I have personally reviewed following labs and imaging studies  CBC: Recent Labs  Lab 06/22/23 0305 06/22/23 1641 06/22/23 1647  WBC 8.4  --   --   NEUTROABS 4.8  --   --   HGB 12.8 13.6 13.6  HCT 41.3 40.0 40.0  MCV 97.4  --   --   PLT 299  --   --    Basic Metabolic Panel: Recent Labs  Lab 06/21/23 0244 06/22/23 0305 06/22/23 1641 06/22/23 1647 06/23/23 0246 06/26/23 0303 06/27/23 0315  NA 138 141 141 141 139 140 138  K 4.4 4.5 4.3 4.2 4.5 4.1 4.1  CL 97* 104  --   --  106 100 97*  CO2 32 29  --   --  27 31 31   GLUCOSE 105* 125*  --   --  103* 98 98  BUN 47* 50*  --   --  30* 22 25*  CREATININE 1.72* 1.50*  --   --  1.18* 1.06* 1.16*  CALCIUM 8.6* 8.0*  --   --  8.0* 8.6* 8.9  MG  --   --   --   --   --  1.6* 2.2      Recent Results (from the past 240 hours)  Resp panel by RT-PCR (RSV, Flu A&B, Covid) Anterior Nasal Swab     Status: None   Collection Time: 06/18/23  3:57 PM   Specimen: Anterior Nasal Swab  Result Value Ref Range Status   SARS Coronavirus 2 by RT PCR NEGATIVE NEGATIVE Final   Influenza A by PCR NEGATIVE NEGATIVE Final   Influenza B by PCR NEGATIVE NEGATIVE Final    Comment: (NOTE) The Xpert Xpress SARS-CoV-2/FLU/RSV plus assay is intended as an aid in the diagnosis of influenza from Nasopharyngeal swab specimens and should not be used as a sole basis for treatment. Nasal washings and aspirates are unacceptable for Xpert Xpress SARS-CoV-2/FLU/RSV testing.  Fact Sheet for Patients: BloggerCourse.com  Fact Sheet for Healthcare Providers: SeriousBroker.it  This test is not yet approved or cleared by the [place] FDA and has been authorized for detection and/or diagnosis of SARS-CoV-2 by FDA under an Emergency Use Authorization (EUA). This EUA will remain in effect (meaning this test can be used) for the duration of the COVID-19 declaration under Section 564(b)(1) of the Act, 21 U.S.C. section 360bbb-3(b)(1), unless the authorization is terminated or revoked.     Resp Syncytial Virus by PCR NEGATIVE NEGATIVE Final    Comment: (NOTE) Fact Sheet for Patients: BloggerCourse.com  Fact Sheet for Healthcare Providers: SeriousBroker.it  This test is not yet approved or cleared by the [place] FDA and has been authorized for detection and/or diagnosis  of SARS-CoV-2 by FDA under an Emergency Use Authorization (EUA). This EUA will remain in effect (meaning this test can be used) for the duration of the COVID-19 declaration under Section 564(b)(1) of the Act, 21 U.S.C. section 360bbb-3(b)(1), unless the authorization is terminated or revoked.  Performed at [facility], [address] [place], [place] [address]      Radiology Studies: No results found.   Scheduled Meds:  ARIPiprazole  2 mg Oral Daily   aspirin EC  81 mg Oral Daily   atorvastatin  20 mg Oral Daily   Chlorhexidine Gluconate Cloth  6 each Topical Q0600   enoxaparin (LOVENOX) injection  40 mg Subcutaneous Q24H   furosemide  40 mg Oral Daily   metoprolol succinate  25 mg Oral Daily   mexiletine  150 mg Oral Q12H   polyethylene glycol  17 g Oral Daily   senna-docusate  1 tablet Oral BID   sodium chloride flush  3 mL Intravenous Q12H   sodium chloride flush  3 mL Intravenous Q12H   spironolactone  12.5 mg Oral Daily   Continuous Infusions:  promethazine (PHENERGAN) injection (IM or IVPB)       LOS: 8 days   Burnadette Pop, MD [facility] P3/26/2025, 10:53 AM  PROGRESS NOTE  Concord Cohick  WNU:[id number] DOB: 25-Jan-1951 DOA: 06/18/2023 PCP: [name], MD   Brief Narrative:   Assessment & Plan:  Principal Problem:   Acute respiratory failure with hypoxia (HCC) Active Problems:   Hyperlipemia, mixed   Depression, major, single episode, moderate (HCC)   Essential hypertension   COPD (chronic obstructive pulmonary disease) with chronic bronchitis (HCC)   Coronary artery disease involving native coronary artery of native heart with angina pectoris (HCC)   Chronic kidney disease, stage 3a (HCC)   Acute on chronic heart failure with preserved ejection fraction (HFpEF) (HCC)           DVT prophylaxis:enoxaparin (LOVENOX) injection 40 mg Start: 06/23/23 1000     Code Status: Do not attempt resuscitation (DNR) PRE-ARREST INTERVENTIONS DESIRED  Family Communication:   Patient status:  Patient is from :  Anticipated discharge to:  Estimated DC date:   Consultants:   Procedures:  Antimicrobials:  Anti-infectives (From admission, onward)    None       Subjective:   Objective: Vitals:   06/26/23 2004 06/27/23 0054 06/27/23  0413 06/27/23 0819  BP: (!) 134/56 114/83 121/86 126/64  Pulse: 84 81 72 95  Resp: 20 20 18 20   Temp: (!) 97.5 F (36.4 C) 97.6 F (36.4 C) (!) 97.5 F (36.4 C) 97.6 F (36.4 C)  TempSrc: Oral Oral Oral Oral  SpO2: 94% 95% 96% 100%  Weight:   86.2 kg   Height:        Intake/Output Summary (Last 24 hours) at 06/27/2023 1053 Last data filed at 06/27/2023 0850 Gross per 24 hour  Intake 540 ml  Output 600 ml  Net -60 ml   Filed Weights   06/24/23 0428 06/26/23 0353 06/27/23 0413  Weight: 89.4 kg 88.7 kg 86.2 kg    Examination:  General exam: Overall comfortable, not in distress HEENT: PERRL Respiratory system:  no wheezes or crackles  Cardiovascular system: S1 & S2 heard, RRR.  Gastrointestinal system: Abdomen is nondistended, soft and nontender. Central nervous system: Alert and oriented Extremities: No edema, no clubbing ,no cyanosis Skin: No rashes, no ulcers,no icterus     Data Reviewed: I have personally reviewed following  labs and imaging studies  CBC: Recent Labs  Lab 06/22/23 0305 06/22/23 1641 06/22/23 1647  WBC 8.4  --   --   NEUTROABS 4.8  --   --   HGB 12.8 13.6 13.6  HCT 41.3 40.0 40.0  MCV 97.4  --   --   PLT 299  --   --    Basic Metabolic Panel: Recent Labs  Lab 06/21/23 0244 06/22/23 0305 06/22/23 1641 06/22/23 1647 06/23/23 0246 06/26/23 0303 06/27/23 0315  NA 138 141 141 141 139 140 138  K 4.4 4.5 4.3 4.2 4.5 4.1 4.1  CL 97* 104  --   --  106 100 97*  CO2 32 29  --   --  27 31 31   GLUCOSE 105* 125*  --   --  103* 98 98  BUN 47* 50*  --   --  30* 22 25*  CREATININE 1.72* 1.50*  --   --  1.18* 1.06* 1.16*  CALCIUM 8.6* 8.0*  --   --  8.0* 8.6* 8.9  MG  --   --   --   --   --  1.6* 2.2     Recent Results (from the past 240 hours)  Resp panel by RT-PCR (RSV, Flu A&B, Covid) Anterior Nasal Swab     Status: None   Collection Time: 06/18/23  3:57 PM   Specimen: Anterior Nasal Swab  Result Value Ref Range Status   SARS Coronavirus  2 by RT PCR NEGATIVE NEGATIVE Final   Influenza A by PCR NEGATIVE NEGATIVE Final   Influenza B by PCR NEGATIVE NEGATIVE Final    Comment: (NOTE) The Xpert Xpress SARS-CoV-2/FLU/RSV plus assay is intended as an aid in the diagnosis of influenza from Nasopharyngeal swab specimens and should not be used as a sole basis for treatment. Nasal washings and aspirates are unacceptable for Xpert Xpress SARS-CoV-2/FLU/RSV testing.  Fact Sheet for Patients: BloggerCourse.com  Fact Sheet for Healthcare Providers: SeriousBroker.it  This test is not yet approved or cleared by the [place] FDA and has been authorized for detection and/or diagnosis of SARS-CoV-2 by FDA under an Emergency Use Authorization (EUA). This EUA will remain in effect (meaning this test can be used) for the duration of the COVID-19 declaration under Section 564(b)(1) of the Act, 21 U.S.C. section 360bbb-3(b)(1), unless the authorization is terminated or revoked.     Resp Syncytial Virus by PCR NEGATIVE NEGATIVE Final    Comment: (NOTE) Fact Sheet for Patients: BloggerCourse.com  Fact Sheet for Healthcare Providers: SeriousBroker.it  This test is not yet approved or cleared by the [place] FDA and has been authorized for detection and/or diagnosis of SARS-CoV-2 by FDA under an Emergency Use Authorization (EUA). This EUA will remain in effect (meaning this test can be used) for the duration of the COVID-19 declaration under Section 564(b)(1) of the Act, 21 U.S.C. section 360bbb-3(b)(1), unless the authorization is terminated or revoked.  Performed at [facility], [address] [place], [place] [address]      Radiology Studies: No results found.   Scheduled Meds:  ARIPiprazole  2 mg Oral Daily   aspirin EC  81 mg Oral Daily   atorvastatin  20 mg Oral Daily   Chlorhexidine Gluconate Cloth  6  each Topical Q0600   enoxaparin (LOVENOX) injection  40 mg Subcutaneous Q24H   furosemide  40 mg Oral Daily   metoprolol succinate  25 mg Oral Daily   mexiletine  150 mg  Oral Q12H   polyethylene glycol  17 g Oral Daily   senna-docusate  1 tablet Oral BID   sodium chloride flush  3 mL Intravenous Q12H   sodium chloride flush  3 mL Intravenous Q12H   spironolactone  12.5 mg Oral Daily   Continuous Infusions:  promethazine (PHENERGAN) injection (IM or IVPB)       LOS: 8 days   Burnadette Pop, MD [facility] P3/26/2025, 10:53 AM

## 2023-06-27 NOTE — Progress Notes (Addendum)
 Patient Name: [name] [name] Date of Encounter: 06/27/2023 [facility] Cardiologist: Verne Carrow, MD   Interval Summary  .    Just had her foley removed, continues to feel short of breath. Says "she can feel her PVCs". No chest pain.   Vital Signs .    Vitals:   06/26/23 2004 06/27/23 0054 06/27/23 0413 06/27/23 0819  BP: (!) 134/56 114/83 121/86 126/64  Pulse: 84 81 72 95  Resp: 20 20 18 20   Temp: (!) 97.5 F (36.4 C) 97.6 F (36.4 C) (!) 97.5 F (36.4 C) 97.6 F (36.4 C)  TempSrc: Oral Oral Oral Oral  SpO2: 94% 95% 96% 100%  Weight:   86.2 kg   Height:        Intake/Output Summary (Last 24 hours) at 06/27/2023 1004 Last data filed at 06/27/2023 0850 Gross per 24 hour  Intake 540 ml  Output 600 ml  Net -60 ml      06/27/2023    4:13 AM 06/26/2023    3:53 AM 06/24/2023    4:28 AM  Last 3 Weights  Weight (lbs) 190 lb 0.6 oz 195 lb 8.8 oz 197 lb 1.5 oz  Weight (kg) 86.2 kg 88.7 kg 89.4 kg      Telemetry/ECG    Sinus Rhythm with intermittent trigeminy - Personally Reviewed  Physical Exam .    GEN: No acute distress, remains on [place] @1 .5L  Neck: No JVD Cardiac: RRR, no murmurs, rubs, or gallops.  Respiratory: Clear to auscultation bilaterally. GI: Soft, nontender, non-distended  MS: No edema  Assessment & Plan .     [name] [name] is a 73 y.o. female with a hx of CAD, HTN, HLD, CKD IIIa, COPD, OA who is being seen 06/20/2023 for the evaluation of CHF at the request of Dr. Jacqulyn Bath.   Acute on Chronic hypoxic respiratory failure Suspected OSA Pulmonary HTN COPD -- presented with worsening dyspnea on exertion over the past couple of weeks. BNP 715, CT chest with small bilateral effusions -- s/p IV lasix, net - 3.7L. Weight down to 190 lbs. Now on lasix 40mg  daily -- echo 3/18 with LVEF of 60-65%, g1DD, mildly reduced RV with interventricular septum flattening consistent with RV pressure and volume overload -- cardiac cath 3/21 with  non-obstructive CAD, moderate pHTN with PA pressure of   PVCs -- has been on flecainide 50mg  TID historically but with hx of CAD this was stopped -- on metoprolol XL 25mg  daily -- continue mexiletine 150mg  BID    CKD IIIa with AKI -- baseline Cr around 1.2-1.3, up to 1.58>> 1.7>>1.5>>1.06 -- resolved    Urinary retention -- s/p foley   Hypomagnesemia  -- Mag 1.6>> improved to 2.2 today   Remains weak, reports she is unable to ambulate and nauseated. Requesting to go for rehab at discharge.    For questions or updates, please contact [facility] Please consult [email or web address] for contact info under        Signed, Laverda Page, NP   Patient seen, examined. Available data reviewed. Agree with findings, assessment, and plan as outlined by Laverda Page, NP. On my exam: Vitals:   06/27/23 0413 06/27/23 0819  BP: 121/86 126/64  Pulse: 72 95  Resp: 18 20  Temp: (!) 97.5 F (36.4 C) 97.6 F (36.4 C)  SpO2: 96% 100%   Pt is alert and oriented, pleasant elderly woman on O2 per [place] in NAD HEENT: normal Neck: JVP - normal Lungs: CTA bilaterally  CV: RRR without murmur or gallop Abd: soft, NT, Positive BS, no hepatomegaly Ext: no C/C/E, distal pulses intact and equal Skin: warm/dry no rash  I think patient is now stable from a cardiac perspective. She is still having frequent PVC's and bigeminy and we will have to see how the combination of metoprolol and mexilitine work over time for her. Continue current Rx with ASA, atorvastatin, furosemdie 40 mg daily, spiro 12.5 mg, metoprolol succinate, and mexilitine. Plans noted for ST-SNF as she seems severely deconditioned. Will arrange cardiology follow-up in outpatient setting. Please let us know if any other cardiac issues or questions come up. Will sign off. thx  Tonny Bollman, M.D. 06/27/2023 2:19 PM

## 2023-06-27 NOTE — TOC Progression Note (Addendum)
 Transition of Care [facility]) - Progression Note    Patient Details  Name: [name] [name] MRN: [id number] Date of Birth: 09-12-1950  Transition of Care [facility]) CM/SW Contact  Michaela Corner, [place] Phone Number: 06/27/2023, 11:50 AM  Clinical Narrative: Per Marylene Land with [facility] and [facility], both facilities have bed availability for patient. CSW informed patient that both facilities have a bed. Patient has chosen to go to [facility].   CSW checked insurance Berkley Harvey is still pending at this time.   1:50 PM Patients insurance Berkley Harvey continues to be pending   1:57 PM CSW called [employer] navi to ask about pending auth. Per representative, Berkley Harvey is under MD review.   3:24 PM Insurance Berkley Harvey still pending at this time.  TOC will continue to follow.    Expected Discharge Plan: Skilled Nursing Facility Barriers to Discharge: Continued Medical Work up, SNF Pending bed offer, Insurance Authorization  Expected Discharge Plan and Services In-house Referral: Clinical Social Work     Living arrangements for the past 2 months: Single Family Home                                       Social Determinants of Health (SDOH) Interventions SDOH Screenings   Food Insecurity: No Food Insecurity (06/19/2023)  Housing: Low Risk  (06/26/2023)  Transportation Needs: No Transportation Needs (06/26/2023)  Utilities: Not At Risk (06/19/2023)  Alcohol Screen: Low Risk  (06/26/2023)  Depression (PHQ2-9): Low Risk  (05/14/2023)  Recent Concern: Depression (PHQ2-9) - High Risk (05/14/2023)  Financial Resource Strain: Low Risk  (06/26/2023)  Physical Activity: Inactive (05/14/2023)  Social Connections: Socially Isolated (06/19/2023)  Stress: No Stress Concern Present (05/14/2023)  Tobacco Use: Medium Risk (06/18/2023)  Health Literacy: Adequate Health Literacy (05/14/2023)    Readmission Risk Interventions     No data to display

## 2023-06-27 NOTE — Plan of Care (Signed)
   Problem: Clinical Measurements: Goal: Respiratory complications will improve Outcome: Progressing   Problem: Activity: Goal: Risk for activity intolerance will decrease Outcome: Progressing   Problem: Coping: Goal: Level of anxiety will decrease Outcome: Progressing

## 2023-06-27 NOTE — Progress Notes (Addendum)
 On return from [facility], patient experienced change in cardiac rhythm. Captured by central telemetry but unable to capture on 12 lead EKG due to transient nature. Patient reports that she felt the change in rhythm and "always do[es]". EKG machine left attached for quick capture but no repeat episodes in 2 hours. Will attempt to capture if repeat incident occurs.  Addendum: patient requesting removal of 12 lead EKG stating "it's time to put the toys away". No further episodes captured.

## 2023-06-27 NOTE — Progress Notes (Signed)
 Heart Failure Stewardship Pharmacist Progress Note   PCP: [name], MD PCP-Cardiologist: Verne Carrow, MD    HPI:  73 y.o. female with PMHx of RA, GERD, allergic rhinitis, osteoarthritis, HLD, HTN, COPD, CAD, CKD (stage 3a), and depression/anxiety.  She presented to ED on 03/17 with complaints of worsening SOB. Patient reported she is typically SOB and has a non-productive cough at baseline due to COPD, but 3 days ago she noticed DOE with minimal activity. She also reported lower extremity swelling.  Initial vitals were 152/93, HR 84, RR 32. She was given 6 L O2 via [place] and later transitioned to BiPAP. BNP 715.9. She was given Lasix IV 40 mg prior to admission. CXR on 03/17 showed bilateral lower lobe airspace opacities could reflect atelectasis or pneumonia. Chest CT on  03/17 revealed no evidence of pulmonary embolism, scattered atelectasis or scarring bilaterally, small bilateral pleural effusions, scattered pulmonary nodules not significantly changed from 2022, multi-vessel coronary artery calcifications, aortic arthrosclerosis with aneurysmal dilation of the ascending aorta. She does follow cardiology for CAD. Her SO recently passed ~2 weeks ago. ECHO on 03/18 revealed LVEF of 60-65% (unchanged from 2020), LV has normal function, no regional wall abnormalities, LV G1 DD, signs of RV pressure and volume overload, MV is normal in structure, and right atrial pressure of 3 mmHg.   R/L HC on 03/21 revealed moderate pulmonary HTN (PA pressure 45 mmHg), CO 4 L/min, CI 2.2, RA 13, and wedge 20 mmHg.   Patient was transitioned off IV lasix on 03/19. Lisinopril 10 mg and Amlodipine 5 mg discontinued on 03/19.   Pt states she feels the same as yesterday. Her nausea is still unchanged and she cannot eat much. She also admitted to lightheadedness/dizziness while walking. Since the removal of her foley today she has noticed significant urinary retention.   Current HF  Medications: Diuretic: Furosemide 40 mg PO QD Beta Blocker: Metoprolol succinate 25 mg PO every day MRA: Spironolactone 12.5 mg QD  Prior to admission HF Medications: Beta blocker: Metoprolol succinate 50 mg every day (last dispensed 05/2023 for 90 ds) ACE/ARB/ARNI: Lisinopril 10 mg every day (last dispensed 05/2023 for 90 ds) Other: Amlodipine 5 mg every day (last dispensed 06/2023 for 90 ds) and Imdur 30 mg every day (patient reported not taking; last dispensed 06/2023 for 90 ds)  Pertinent Lab Values Serum creatinine: 1.16(<1.06), BUN 25(<22), Potassium 4.1(<4.1), Sodium 138 (<140), BNP 715.9, Magnesium 2.2(<1.6), A1c 5.8 (05/2023)  Vital Signs: Weight: 190 lbs (admission weight: 190.9 lbs<- likely a guess) bed wt Blood pressure: 106/60-138/72 (126/64 @ 0819) Heart rate: 72-87 (95 @ 0819) I/O: net -0.7 L yesterday; net -1.7 L since admission  Medication Assistance / Insurance Benefits Check: Does the patient have prescription insurance?  Yes Type of insurance plan: [employer] Medicare  Does the patient qualify for medication assistance through manufacturers or grants?   Yes  Outpatient Pharmacy:  Prior to admission outpatient pharmacy:  [id number] - [place], [place] - [address]   Is the patient willing to use [facility] TOC pharmacy at discharge? Yes Is the patient willing to transition their outpatient pharmacy to utilize a [facility] outpatient pharmacy?   No    Assessment: 1. Acute on chronic HFpEF (LVEF 60-65%). NYHA class I symptoms. -Strict I/Os and daily weights. Keep K>4, Mg>2 -Continue Furosemide 20 mg every day -Continue metoprolol succinate at 25 mg every day -Consider increasing Spironolactone to 25 mg every day  -Restart Lisinopril at 5 mg every day at  discharge -Poor candidate for SGLT2i d/t urinary retention  Plan: 1) Medication changes recommended at this time: -Consider increasing Spironolactone to 25 mg every day  -Restart Lisinopril at 5 mg every day at  discharge    2) Patient assistance: -She is agreeable to using [facility] TOC pharmacy at discharge  3)  Education  - To be completed prior to discharge  Sofie Rower, PharmD [employer] PGY-1

## 2023-06-27 NOTE — Plan of Care (Signed)
 Problem: Health Behavior/Discharge Planning: Goal: Ability to manage health-related needs will improve 06/27/2023 1439 by Martie Round, RN Outcome: Progressing 06/27/2023 1439 by Martie Round, RN Outcome: Not Progressing   Problem: Clinical Measurements: Goal: Ability to maintain clinical measurements within normal limits will improve 06/27/2023 1439 by Martie Round, RN Outcome: Progressing 06/27/2023 1439 by Martie Round, RN Outcome: Not Progressing Goal: Will remain free from infection 06/27/2023 1439 by Martie Round, RN Outcome: Progressing 06/27/2023 1439 by Martie Round, RN Outcome: Not Progressing Goal: Diagnostic test results will improve 06/27/2023 1439 by Martie Round, RN Outcome: Progressing 06/27/2023 1439 by Martie Round, RN Outcome: Not Progressing Goal: Respiratory complications will improve 06/27/2023 1439 by Martie Round, RN Outcome: Progressing 06/27/2023 1439 by Martie Round, RN Outcome: Not Progressing Goal: Cardiovascular complication will be avoided 06/27/2023 1439 by Martie Round, RN Outcome: Progressing 06/27/2023 1439 by Martie Round, RN Outcome: Not Progressing   Problem: Activity: Goal: Risk for activity intolerance will decrease 06/27/2023 1439 by Martie Round, RN Outcome: Progressing 06/27/2023 1439 by Martie Round, RN Outcome: Not Progressing   Problem: Nutrition: Goal: Adequate nutrition will be maintained 06/27/2023 1439 by Martie Round, RN Outcome: Progressing 06/27/2023 1439 by Martie Round, RN Outcome: Not Progressing   Problem: Coping: Goal: Level of anxiety will decrease 06/27/2023 1439 by Martie Round, RN Outcome: Progressing 06/27/2023 1439 by Martie Round, RN Outcome: Not Progressing   Problem: Elimination: Goal: Will not experience complications related to bowel motility 06/27/2023 1439 by Martie Round, RN Outcome: Progressing 06/27/2023 1439 by Martie Round, RN Outcome: Not Progressing Goal: Will not experience complications related to urinary retention 06/27/2023 1439 by Martie Round, RN Outcome: Progressing 06/27/2023 1439 by Martie Round, RN Outcome: Not Progressing   Problem: Pain Managment: Goal: General experience of comfort will improve and/or be controlled 06/27/2023 1439 by Martie Round, RN Outcome: Progressing 06/27/2023 1439 by Martie Round, RN Outcome: Not Progressing   Problem: Safety: Goal: Ability to remain free from injury will improve 06/27/2023 1439 by Martie Round, RN Outcome: Progressing 06/27/2023 1439 by Martie Round, RN Outcome: Not Progressing   Problem: Skin Integrity: Goal: Risk for impaired skin integrity will decrease 06/27/2023 1439 by Martie Round, RN Outcome: Progressing 06/27/2023 1439 by Martie Round, RN Outcome: Not Progressing   Problem: Education: Goal: Ability to demonstrate management of disease process will improve 06/27/2023 1439 by Martie Round, RN Outcome: Progressing 06/27/2023 1439 by Martie Round, RN Outcome: Not Progressing Goal: Ability to verbalize understanding of medication therapies will improve 06/27/2023 1439 by Martie Round, RN Outcome: Progressing 06/27/2023 1439 by Martie Round, RN Outcome: Not Progressing Goal: Individualized Educational Video(s) 06/27/2023 1439 by Martie Round, RN Outcome: Progressing 06/27/2023 1439 by Martie Round, RN Outcome: Not Progressing   Problem: Activity: Goal: Capacity to carry out activities will improve 06/27/2023 1439 by Martie Round, RN Outcome: Progressing 06/27/2023 1439 by Martie Round, RN Outcome: Not Progressing   Problem: Cardiac: Goal: Ability to achieve and maintain adequate cardiopulmonary perfusion will improve 06/27/2023 1439 by Martie Round, RN Outcome: Progressing 06/27/2023 1439 by Martie Round, RN Outcome: Not Progressing   Problem: Education: Goal:  Understanding of CV disease, CV risk reduction, and recovery process will improve 06/27/2023 1439 by Martie Round, RN Outcome: Progressing 06/27/2023 1439 by Martie Round, RN Outcome: Not Progressing Goal:  Individualized Educational Video(s) 06/27/2023 1439 by Martie Round, RN Outcome: Progressing 06/27/2023 1439 by Martie Round, RN Outcome: Not Progressing   Problem: Activity: Goal: Ability to return to baseline activity level will improve 06/27/2023 1439 by Martie Round, RN Outcome: Progressing 06/27/2023 1439 by Martie Round, RN Outcome: Not Progressing   Problem: Cardiovascular: Goal: Ability to achieve and maintain adequate cardiovascular perfusion will improve 06/27/2023 1439 by Martie Round, RN Outcome: Progressing 06/27/2023 1439 by Martie Round, RN Outcome: Not Progressing Goal: Vascular access site(s) Level 0-1 will be maintained 06/27/2023 1439 by Martie Round, RN Outcome: Progressing 06/27/2023 1439 by Martie Round, RN Outcome: Not Progressing   Problem: Health Behavior/Discharge Planning: Goal: Ability to safely manage health-related needs after discharge will improve 06/27/2023 1439 by Martie Round, RN Outcome: Progressing 06/27/2023 1439 by Martie Round, RN Outcome: Not Progressing

## 2023-06-27 NOTE — Progress Notes (Signed)
 Physical Therapy Treatment Patient Details Name: [name] [name] MRN: [id number] DOB: 1950-08-21 Today's Date: 06/27/2023   History of Present Illness Pt is a 73 y.o. F admitted 06/18/23 with SOB and associated hypoxia. CTA chest neg for PE, had small bilat pleural effusions and small nodules. Pt was diuresed, found to be hypotensive, and received 500 cc of fluid. 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   PT Comments  Pt greeted on [facility], pleasant and agreeable to PT session. She tolerated static stance for ~4mins for pericare to be addressed. Pt completed transfers using RW with supervision. Unable to progress gait or administer balance assessment secondary to pt's nausea and fatigue. Will continue to follow acutely and advance appropriately.      If plan is discharge home, recommend the following: A little help with walking and/or transfers;A little help with bathing/dressing/bathroom;Assistance with cooking/housework;Assist for transportation;Help with stairs or ramp for entrance   Can travel by private vehicle     Yes  Equipment Recommendations  Rolling walker (2 wheels)    Recommendations for Other Services       Precautions / Restrictions Precautions Precautions: Fall Recall of Precautions/Restrictions: Intact Restrictions Weight Bearing Restrictions Per Provider Order: No     Mobility  Bed Mobility Overal bed mobility: Needs Assistance Bed Mobility: Sit to Supine       Sit to supine: Supervision, HOB elevated, Used rails   General bed mobility comments: Pt greeted seated on BSC. She brought BLE back into bed with increased time. She repositioned herself using bed rails.    Transfers Overall transfer level: Needs assistance Equipment used: Rolling walker (2 wheels), None Transfers: Sit to/from Stand, Bed to chair/wheelchair/BSC Sit to Stand: Supervision   Step pivot transfers: Supervision       General transfer comment: Pt stood from [facility] in  order for pericare to be addressed by PT, pushing up with BUE from [facility] rails and transitioning to static stance with support of RW. She completed a step-pivot transfer to L to return to bed from [facility] using RW. Pt stood from lowest bed height and took a couple of side steps towards [facility] without an AD. Good eccentric control.    Ambulation/Gait               General Gait Details: Deferred d/t nausea and fatigue.   Stairs             Wheelchair Mobility     Tilt Bed    Modified Rankin (Stroke Patients Only)       Balance Overall balance assessment: Needs assistance Sitting-balance support: No upper extremity supported, Feet supported Sitting balance-Leahy Scale: Fair Sitting balance - Comments: Pt sat on BSC and EOB with supervision.   Standing balance support: During functional activity, Bilateral upper extremity supported, Reliant on assistive device for balance Standing balance-Leahy Scale: Fair Standing balance comment: Pt relies on RW. Pt tolerated static stance for ~69mins. She completed transfers with supervision using RW.                            Communication Communication Communication: No apparent difficulties  Cognition Arousal: Alert Behavior During Therapy: WFL for tasks assessed/performed   PT - Cognitive impairments: No apparent impairments                         Following commands: Intact      Cueing  Cueing Techniques: Verbal cues  Exercises      General Comments General comments (skin integrity, edema, etc.): Pt c/o constant nausea and reported 9/10 on the modified RPE scale following transfer back to bed from [facility].      Pertinent Vitals/Pain Pain Assessment Pain Assessment: No/denies pain    Home Living                          Prior Function            PT Goals (current goals can now be found in the care plan section) Acute Rehab PT Goals Patient Stated Goal: Return Home Progress towards PT  goals: Progressing toward goals    Frequency    Min 2X/week      PT Plan      Co-evaluation              AM-PAC PT "6 Clicks" Mobility   Outcome Measure  Help needed turning from your back to your side while in a flat bed without using bedrails?: A Little Help needed moving from lying on your back to sitting on the side of a flat bed without using bedrails?: A Little Help needed moving to and from a bed to a chair (including a wheelchair)?: A Little Help needed standing up from a chair using your arms (e.g., wheelchair or bedside chair)?: A Little Help needed to walk in hospital room?: A Little Help needed climbing 3-5 steps with a railing? : A Little 6 Click Score: 18    End of Session Equipment Utilized During Treatment: Gait belt;Oxygen Activity Tolerance: Patient limited by fatigue;Other (comment) (Treatment limited secondary to nausea) Patient left: in bed;with call bell/phone within reach;with bed alarm set Nurse Communication: Mobility status;Other (comment) (Pt's BM, nausea, and request for a stool softner) PT Visit Diagnosis: Muscle weakness (generalized) (M62.81);History of falling (Z91.81);Repeated falls (R29.6);Unsteadiness on feet (R26.81);Difficulty in walking, not elsewhere classified (R26.2);Other abnormalities of gait and mobility (R26.89)     Time: 1610-9604 PT Time Calculation (min) (ACUTE ONLY): 16 min  Charges:    $Therapeutic Activity: 8-22 mins PT General Charges $$ ACUTE PT VISIT: 1 Visit                     Cheri Guppy, PT, DPT Acute Rehabilitation Services Office: [phone] Secure Chat Preferred  [name] 06/27/2023, 1:35 PM

## 2023-06-28 DIAGNOSIS — J9601 Acute respiratory failure with hypoxia: Secondary | ICD-10-CM | POA: Diagnosis not present

## 2023-06-28 NOTE — Progress Notes (Signed)
 Heart Failure Stewardship Pharmacist Progress Note   PCP: [name], MD PCP-Cardiologist: Verne Carrow, MD    HPI:  73 y.o. female with PMHx of RA, GERD, allergic rhinitis, osteoarthritis, HLD, HTN, COPD, CAD, CKD (stage 3a), and depression/anxiety.  She presented to ED on 03/17 with complaints of worsening SOB. Patient reported she is typically SOB and has a non-productive cough at baseline due to COPD, but 3 days ago she noticed DOE with minimal activity. She also reported lower extremity swelling.  Initial vitals were 152/93, HR 84, RR 32. She was given 6 L O2 via [place] and later transitioned to BiPAP. BNP 715.9. She was given Lasix IV 40 mg prior to admission. CXR on 03/17 showed bilateral lower lobe airspace opacities could reflect atelectasis or pneumonia. Chest CT on  03/17 revealed no evidence of pulmonary embolism, scattered atelectasis or scarring bilaterally, small bilateral pleural effusions, scattered pulmonary nodules not significantly changed from 2022, multi-vessel coronary artery calcifications, aortic arthrosclerosis with aneurysmal dilation of the ascending aorta. She does follow cardiology for CAD. Her SO recently passed ~2 weeks ago. ECHO on 03/18 revealed LVEF of 60-65% (unchanged from 2020), LV has normal function, no regional wall abnormalities, LV G1 DD, signs of RV pressure and volume overload, MV is normal in structure, and right atrial pressure of 3 mmHg.   R/L HC on 03/21 revealed moderate pulmonary HTN (PA pressure 45 mmHg), CO 4 L/min, CI 2.2, RA 13, and wedge 20 mmHg.   Patient was transitioned off IV lasix on 03/19. Lisinopril 10 mg and Amlodipine 5 mg discontinued on 03/19.   No SOB or DOE. She did report she is unable to move very much due to nausea and fatigue. When she tries to walk farther than usual she becomes lightheaded. She is still experiencing significant urinary retention since removal of her foley.   Current HF Medications: Diuretic:  Furosemide 40 mg PO QD Beta Blocker: Metoprolol succinate 25 mg PO every day MRA: Spironolactone 25 mg every day  Prior to admission HF Medications: Beta blocker: Metoprolol succinate 50 mg every day (last dispensed 05/2023 for 90 ds) ACE/ARB/ARNI: Lisinopril 10 mg every day (last dispensed 05/2023 for 90 ds) Other: Amlodipine 5 mg every day (last dispensed 06/2023 for 90 ds) and Imdur 30 mg every day (patient reported not taking; last dispensed 06/2023 for 90 ds)  Pertinent Lab Values (as of 03/26) Serum creatinine: 1.16(<1.06), BUN 25(<22), Potassium 4.1(<4.1), Sodium 138 (<140), BNP 715.9, Magnesium 2.2(<1.6), A1c 5.8 (05/2023) Vital Signs: Weight: 190 lbs (admission weight: 190.9 lbs<- likely a guess) bed wt Blood pressure: 110/91-135/83 (133/81 @ 0725) Heart rate: 78-95 (88 @ 0725) I/O: net -0.49 L yesterday; net -3.6 L since admission  Medication Assistance / Insurance Benefits Check: Does the patient have prescription insurance?  Yes Type of insurance plan: UHC Medicare  Does the patient qualify for medication assistance through manufacturers or grants?   Yes  Outpatient Pharmacy:  Prior to admission [facility] pharmacy:  [id number] - [place], [place] - [address]   Is the patient willing to use [facility] TOC pharmacy at discharge? Yes Is the patient willing to transition their outpatient pharmacy to utilize a [facility] outpatient pharmacy?   No    Assessment: 1. Acute on chronic HFpEF (LVEF 60-65%). NYHA class I symptoms. -Strict I/Os and daily weights. Keep K>4, Mg>2 -Continue Furosemide 40 mg every day -Continue metoprolol succinate at 25 mg every day -Continue Spironolactone to 25 mg every day  -Restart Lisinopril  at 5 mg every day at discharge -Poor candidate for SGLT2i d/t urinary retention  Plan: 1) Medication changes recommended at this time: -Restart Lisinopril at 5 mg every day at discharge    2) Patient assistance: -She is agreeable to using [facility] TOC  pharmacy at discharge  3)  Education  - To be completed prior to discharge  Sofie Rower, PharmD [employer] PGY-1

## 2023-06-28 NOTE — Progress Notes (Signed)
 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  On presentation ,she was noted to be tachypneic with increased work of breathing, had to put on BiPAP.  COVID/flu/RSV negative.  Chest lower lobe bilateral lower lobe airspace opacities.  CTA chest ruled out PE.  BNP was elevated.  Patient was admitted for the management of acute on chronic HFpEF.  Cardiology was consulted and following.  Clinically improved now.  PT/OT recommending SNF.  Peer-to-peer done today, medical director asking for updated  PT note.  Medically stable for discharge whenever possible  Assessment & Plan:  Principal Problem:   Acute respiratory failure with hypoxia (HCC) Active Problems:   Hyperlipemia, mixed   Depression, major, single episode, moderate (HCC)   Essential hypertension   COPD (chronic obstructive pulmonary disease) with chronic bronchitis (HCC)   Coronary artery disease involving native coronary artery of native heart with angina pectoris (HCC)   Chronic kidney disease, stage 3a (HCC)   Acute on chronic heart failure with preserved ejection fraction (HFpEF) (HCC)   Acute hypoxic respiratory failure secondary to CHF exacerbation:   Hypoxic on arrival, had to put on BiPAP, now weaned to room air.  Continue to wean the oxygen.  CTA negative for PE.  Chest x-ray showed lower lobe opacities likely from pulm edema.  Continue bronchodilators as needed, incentive spirometer.  On 2 L of oxygen this morning.  She will most likely needs oxygen chronically.  Can continue oxygen supplementation at SNF  Acute on chronic HFpEF/pulmonary  hypertension: Presented with bilateral lower extremity edema, dyspnea on exertion.elevated BNP.  She was on IV Lasix.  Echocardiogram done here showed preserved EF, grade 1 diastolic dysfunction, pulmonary hypertension.  Cardiology did cardiac cath which showed diffuse coronary disease but no target for PCI.  Current plan is for medical management.  Also found to have moderate pulmonary hypertension.  She should follow-up with cardiology as an outpatient for management of pulmonary hypertension.  Started on oral Lasix  COPD: Currently without wheezing.  Continue bronchodilators as needed  Coronary artery disease/elevated troponin: Declines any chest pain at present.  Troponins mildly elevated.  Echo did not show any wall motion abnormality. cardiac cath which showed diffuse coronary disease but no target for PCI.  Current plan is for medical management.  Pulmonary nodules: Seen on CT.  Follow-up recommended with CT chest in a year  History of frequent symptomatic PVCs: On flecainide, Toprol at home.  Flecainide stopped.  Continue metoprolol, mexiletine  History of hypertension: Hospital course remarkable for hypotension.  She was on metoprolol, amlodipine, lisinopril at home.these meds were held due to hypotension, given IV fluids. Currently blood pressure stable.  Restarted metoprolol  AKI on CKD stage IIIb: Baseline creatinine around 1.1-1.5.  Currently kidney function at baseline.  Hyperlipidemia: Currently not on statin  Depression/anxiety: On Abilify  Debility/deconditioning: Patient seen by PT/OT and recommended SNF on discharge.  TOC following        DVT prophylaxis:enoxaparin (LOVENOX) injection 40 mg Start: 06/23/23 1000     Code Status: Do not attempt resuscitation (DNR) PRE-ARREST INTERVENTIONS DESIRED  Family Communication: None at the bedside  Patient status:Inpatient  Patient is from :Home  Anticipated discharge to:SNF  Estimated DC date:whenever  possible   Consultants: Cardiology  Procedures:Cardiac cath  Antimicrobials:  Anti-infectives (From admission, onward)    None       Subjective:  Patient seen and examined at bedside today.  Hemodynamically stable.  Feels better today.  Nausea is better.  Lying in bed.  On 2 L of oxygen per minute.  Denies any worsening shortness of breath or cough.  Foley removed yesterday  Objective: Vitals:   06/28/23 0416 06/28/23 0417 06/28/23 0725 06/28/23 1028  BP: 122/72  133/81 131/73  Pulse: 78 78 88 84  Resp: 16 19 20 20   Temp: 98.6 F (37 C)  97.8 F (36.6 C) 97.9 F (36.6 C)  TempSrc: Oral  Oral Oral  SpO2: 92% 93% 94% 97%  Weight:      Height:        Intake/Output Summary (Last 24 hours) at 06/28/2023 1218 Last data filed at 06/28/2023 0925 Gross per 24 hour  Intake 363 ml  Output 201 ml  Net 162 ml   Filed Weights   06/26/23 0353 06/27/23 0413 06/28/23 0112  Weight: 88.7 kg 86.2 kg 87.1 kg    Examination:  General exam: Overall comfortable, not in distress, weak, deconditioned HEENT: PERRL Respiratory system: Mildly diminished sounds in bases Cardiovascular system: S1 & S2 heard, RRR.  Gastrointestinal system: Abdomen is nondistended, soft and nontender. Central nervous system: Alert and oriented Extremities: Bilateral lower extremity nonpitting edema, no clubbing ,no cyanosis Skin: No rashes, no ulcers,no icterus     Data Reviewed: I have personally reviewed following labs and imaging studies  CBC: Recent Labs  Lab 06/22/23 0305 06/22/23 1641 06/22/23 1647  WBC 8.4  --   --   NEUTROABS 4.8  --   --   HGB 12.8 13.6 13.6  HCT 41.3 40.0 40.0  MCV 97.4  --   --   PLT 299  --   --    Basic Metabolic Panel: Recent Labs  Lab 06/22/23 0305 06/22/23 1641 06/22/23 1647 06/23/23 0246 06/26/23 0303 06/27/23 0315  NA 141 141 141 139 140 138  K 4.5 4.3 4.2 4.5 4.1 4.1  CL 104  --   --  106 100 97*  CO2 29  --   --  27 31 31   GLUCOSE 125*  --   --   103* 98 98  BUN 50*  --   --  30* 22 25*  CREATININE 1.50*  --   --  1.18* 1.06* 1.16*  CALCIUM 8.0*  --   --  8.0* 8.6* 8.9  MG  --   --   --   --  1.6* 2.2     Recent Results (from the past 240 hours)  Resp panel by RT-PCR (RSV, Flu A&B, Covid) Anterior Nasal Swab     Status: None   Collection Time: 06/18/23  3:57 PM   Specimen: Anterior Nasal Swab  Result Value Ref Range Status   SARS Coronavirus 2 by RT PCR NEGATIVE NEGATIVE Final   Influenza A by PCR NEGATIVE NEGATIVE Final   Influenza B by PCR NEGATIVE NEGATIVE Final    Comment: (NOTE) The Xpert Xpress SARS-CoV-2/FLU/RSV plus assay is intended as an aid in the diagnosis of influenza from Nasopharyngeal swab specimens and should not be used as a sole basis for treatment. Nasal washings and aspirates are unacceptable for Xpert Xpress SARS-CoV-2/FLU/RSV testing.  Fact Sheet for Patients: BloggerCourse.com  Fact Sheet for Healthcare Providers: SeriousBroker.it  This test is not  yet approved or cleared by the [place] and has been authorized for detection and/or diagnosis of SARS-CoV-2 by FDA under an Emergency Use Authorization (EUA). This EUA will remain in effect (meaning this test can be used) for the duration of the COVID-19 declaration under Section 564(b)(1) of the Act, 21 U.S.C. section 360bbb-3(b)(1), unless the authorization is terminated or revoked.     Resp Syncytial Virus by PCR NEGATIVE NEGATIVE Final    Comment: (NOTE) Fact Sheet for Patients: BloggerCourse.com  Fact Sheet for Healthcare Providers: SeriousBroker.it  This test is not yet approved or cleared by the [place] FDA and has been authorized for detection and/or diagnosis of SARS-CoV-2 by FDA under an Emergency Use Authorization (EUA). This EUA will remain in effect (meaning this test can be used) for the duration of the COVID-19  declaration under Section 564(b)(1) of the Act, 21 U.S.C. section 360bbb-3(b)(1), unless the authorization is terminated or revoked.  Performed at [facility], [address] [place], [place] [address]      Radiology Studies: No results found.   Scheduled Meds:  ARIPiprazole  2 mg Oral Daily   aspirin EC  81 mg Oral Daily   atorvastatin  20 mg Oral Daily   Chlorhexidine Gluconate Cloth  6 each Topical Q0600   enoxaparin (LOVENOX) injection  40 mg Subcutaneous Q24H   furosemide  40 mg Oral Daily   metoprolol succinate  25 mg Oral Daily   mexiletine  150 mg Oral Q12H   polyethylene glycol  17 g Oral Daily   senna-docusate  1 tablet Oral BID   sodium chloride flush  3 mL Intravenous Q12H   sodium chloride flush  3 mL Intravenous Q12H   spironolactone  25 mg Oral Daily   Continuous Infusions:  promethazine (PHENERGAN) injection (IM or IVPB)       LOS: 9 days   Burnadette Pop, MD [facility] P3/27/2025, 12:18 PM  PROGRESS NOTE  [name] [name]  UEA:[id number] DOB: 1950/04/18 DOA: 06/18/2023 PCP: [name], MD   Brief Narrative:   Assessment & Plan:  Principal Problem:   Acute respiratory failure with hypoxia (HCC) Active Problems:   Hyperlipemia, mixed   Depression, major, single episode, moderate (HCC)   Essential hypertension   COPD (chronic obstructive pulmonary disease) with chronic bronchitis (HCC)   Coronary artery disease involving native coronary artery of native heart with angina pectoris (HCC)   Chronic kidney disease, stage 3a (HCC)   Acute on chronic heart failure with preserved ejection fraction (HFpEF) (HCC)           DVT prophylaxis:enoxaparin (LOVENOX) injection 40 mg Start: 06/23/23 1000     Code Status: Do not attempt resuscitation (DNR) PRE-ARREST INTERVENTIONS DESIRED  Family Communication:   Patient status:  Patient is from :  Anticipated discharge to:  Estimated DC date:   Consultants:    Procedures:  Antimicrobials:  Anti-infectives (From admission, onward)    None       Subjective:   Objective: Vitals:   06/28/23 0416 06/28/23 0417 06/28/23 0725 06/28/23 1028  BP: 122/72  133/81 131/73  Pulse: 78 78 88 84  Resp: 16 19 20 20   Temp: 98.6 F (37 C)  97.8 F (36.6 C) 97.9 F (36.6 C)  TempSrc: Oral  Oral Oral  SpO2: 92% 93% 94% 97%  Weight:      Height:        Intake/Output Summary (Last 24 hours) at 06/28/2023 1218 Last data filed  at 06/28/2023 0925 Gross per 24 hour  Intake 363 ml  Output 201 ml  Net 162 ml   Filed Weights   06/26/23 0353 06/27/23 0413 06/28/23 0112  Weight: 88.7 kg 86.2 kg 87.1 kg    Examination:  General exam: Overall comfortable, not in distress HEENT: PERRL Respiratory system:  no wheezes or crackles  Cardiovascular system: S1 & S2 heard, RRR.  Gastrointestinal system: Abdomen is nondistended, soft and nontender. Central nervous system: Alert and oriented Extremities: No edema, no clubbing ,no cyanosis Skin: No rashes, no ulcers,no icterus     Data Reviewed: I have personally reviewed following labs and imaging studies  CBC: Recent Labs  Lab 06/22/23 0305 06/22/23 1641 06/22/23 1647  WBC 8.4  --   --   NEUTROABS 4.8  --   --   HGB 12.8 13.6 13.6  HCT 41.3 40.0 40.0  MCV 97.4  --   --   PLT 299  --   --    Basic Metabolic Panel: Recent Labs  Lab 06/22/23 0305 06/22/23 1641 06/22/23 1647 06/23/23 0246 06/26/23 0303 06/27/23 0315  NA 141 141 141 139 140 138  K 4.5 4.3 4.2 4.5 4.1 4.1  CL 104  --   --  106 100 97*  CO2 29  --   --  27 31 31   GLUCOSE 125*  --   --  103* 98 98  BUN 50*  --   --  30* 22 25*  CREATININE 1.50*  --   --  1.18* 1.06* 1.16*  CALCIUM 8.0*  --   --  8.0* 8.6* 8.9  MG  --   --   --   --  1.6* 2.2     Recent Results (from the past 240 hours)  Resp panel by RT-PCR (RSV, Flu A&B, Covid) Anterior Nasal Swab     Status: None   Collection Time: 06/18/23  3:57 PM   Specimen:  Anterior Nasal Swab  Result Value Ref Range Status   SARS Coronavirus 2 by RT PCR NEGATIVE NEGATIVE Final   Influenza A by PCR NEGATIVE NEGATIVE Final   Influenza B by PCR NEGATIVE NEGATIVE Final    Comment: (NOTE) The Xpert Xpress SARS-CoV-2/FLU/RSV plus assay is intended as an aid in the diagnosis of influenza from Nasopharyngeal swab specimens and should not be used as a sole basis for treatment. Nasal washings and aspirates are unacceptable for Xpert Xpress SARS-CoV-2/FLU/RSV testing.  Fact Sheet for Patients: BloggerCourse.com  Fact Sheet for Healthcare Providers: SeriousBroker.it  This test is not yet approved or cleared by the [place] FDA and has been authorized for detection and/or diagnosis of SARS-CoV-2 by FDA under an Emergency Use Authorization (EUA). This EUA will remain in effect (meaning this test can be used) for the duration of the COVID-19 declaration under Section 564(b)(1) of the Act, 21 U.S.C. section 360bbb-3(b)(1), unless the authorization is terminated or revoked.     Resp Syncytial Virus by PCR NEGATIVE NEGATIVE Final    Comment: (NOTE) Fact Sheet for Patients: BloggerCourse.com  Fact Sheet for Healthcare Providers: SeriousBroker.it  This test is not yet approved or cleared by the [place] FDA and has been authorized for detection and/or diagnosis of SARS-CoV-2 by FDA under an Emergency Use Authorization (EUA). This EUA will remain in effect (meaning this test can be used) for the duration of the COVID-19 declaration under Section 564(b)(1) of the Act, 21 U.S.C. section 360bbb-3(b)(1), unless the authorization is terminated or revoked.  Performed  at [facility], [address], [place], [place] [address]      Radiology Studies: No results found.   Scheduled Meds:  ARIPiprazole  2 mg Oral Daily   aspirin EC  81 mg Oral Daily    atorvastatin  20 mg Oral Daily   Chlorhexidine Gluconate Cloth  6 each Topical Q0600   enoxaparin (LOVENOX) injection  40 mg Subcutaneous Q24H   furosemide  40 mg Oral Daily   metoprolol succinate  25 mg Oral Daily   mexiletine  150 mg Oral Q12H   polyethylene glycol  17 g Oral Daily   senna-docusate  1 tablet Oral BID   sodium chloride flush  3 mL Intravenous Q12H   sodium chloride flush  3 mL Intravenous Q12H   spironolactone  25 mg Oral Daily   Continuous Infusions:  promethazine (PHENERGAN) injection (IM or IVPB)       LOS: 9 days   Burnadette Pop, MD [facility] P3/27/2025, 12:18 PM  PROGRESS NOTE  [name] [name]  NWG:[id number] DOB: Dec 14, 1950 DOA: 06/18/2023 PCP: [name], MD   Brief Narrative:   Assessment & Plan:  Principal Problem:   Acute respiratory failure with hypoxia (HCC) Active Problems:   Hyperlipemia, mixed   Depression, major, single episode, moderate (HCC)   Essential hypertension   COPD (chronic obstructive pulmonary disease) with chronic bronchitis (HCC)   Coronary artery disease involving native coronary artery of native heart with angina pectoris (HCC)   Chronic kidney disease, stage 3a (HCC)   Acute on chronic heart failure with preserved ejection fraction (HFpEF) (HCC)           DVT prophylaxis:enoxaparin (LOVENOX) injection 40 mg Start: 06/23/23 1000     Code Status: Do not attempt resuscitation (DNR) PRE-ARREST INTERVENTIONS DESIRED  Family Communication:   Patient status:  Patient is from :  Anticipated discharge to:  Estimated DC date:   Consultants:   Procedures:  Antimicrobials:  Anti-infectives (From admission, onward)    None       Subjective:   Objective: Vitals:   06/28/23 0416 06/28/23 0417 06/28/23 0725 06/28/23 1028  BP: 122/72  133/81 131/73  Pulse: 78 78 88 84  Resp: 16 19 20 20   Temp: 98.6 F (37 C)  97.8 F (36.6 C) 97.9 F (36.6 C)  TempSrc: Oral  Oral Oral  SpO2:  92% 93% 94% 97%  Weight:      Height:        Intake/Output Summary (Last 24 hours) at 06/28/2023 1218 Last data filed at 06/28/2023 0925 Gross per 24 hour  Intake 363 ml  Output 201 ml  Net 162 ml   Filed Weights   06/26/23 0353 06/27/23 0413 06/28/23 0112  Weight: 88.7 kg 86.2 kg 87.1 kg    Examination:  General exam: Overall comfortable, not in distress HEENT: PERRL Respiratory system:  no wheezes or crackles  Cardiovascular system: S1 & S2 heard, RRR.  Gastrointestinal system: Abdomen is nondistended, soft and nontender. Central nervous system: Alert and oriented Extremities: No edema, no clubbing ,no cyanosis Skin: No rashes, no ulcers,no icterus     Data Reviewed: I have personally reviewed following labs and imaging studies  CBC: Recent Labs  Lab 06/22/23 0305 06/22/23 1641 06/22/23 1647  WBC 8.4  --   --   NEUTROABS 4.8  --   --   HGB 12.8 13.6 13.6  HCT 41.3 40.0 40.0  MCV 97.4  --   --   PLT  299  --   --    Basic Metabolic Panel: Recent Labs  Lab 06/22/23 0305 06/22/23 1641 06/22/23 1647 06/23/23 0246 06/26/23 0303 06/27/23 0315  NA 141 141 141 139 140 138  K 4.5 4.3 4.2 4.5 4.1 4.1  CL 104  --   --  106 100 97*  CO2 29  --   --  27 31 31   GLUCOSE 125*  --   --  103* 98 98  BUN 50*  --   --  30* 22 25*  CREATININE 1.50*  --   --  1.18* 1.06* 1.16*  CALCIUM 8.0*  --   --  8.0* 8.6* 8.9  MG  --   --   --   --  1.6* 2.2     Recent Results (from the past 240 hours)  Resp panel by RT-PCR (RSV, Flu A&B, Covid) Anterior Nasal Swab     Status: None   Collection Time: 06/18/23  3:57 PM   Specimen: Anterior Nasal Swab  Result Value Ref Range Status   SARS Coronavirus 2 by RT PCR NEGATIVE NEGATIVE Final   Influenza A by PCR NEGATIVE NEGATIVE Final   Influenza B by PCR NEGATIVE NEGATIVE Final    Comment: (NOTE) The Xpert Xpress SARS-CoV-2/FLU/RSV plus assay is intended as an aid in the diagnosis of influenza from Nasopharyngeal swab specimens  and should not be used as a sole basis for treatment. Nasal washings and aspirates are unacceptable for Xpert Xpress SARS-CoV-2/FLU/RSV testing.  Fact Sheet for Patients: BloggerCourse.com  Fact Sheet for Healthcare Providers: SeriousBroker.it  This test is not yet approved or cleared by the [place] FDA and has been authorized for detection and/or diagnosis of SARS-CoV-2 by FDA under an Emergency Use Authorization (EUA). This EUA will remain in effect (meaning this test can be used) for the duration of the COVID-19 declaration under Section 564(b)(1) of the Act, 21 U.S.C. section 360bbb-3(b)(1), unless the authorization is terminated or revoked.     Resp Syncytial Virus by PCR NEGATIVE NEGATIVE Final    Comment: (NOTE) Fact Sheet for Patients: BloggerCourse.com  Fact Sheet for Healthcare Providers: SeriousBroker.it  This test is not yet approved or cleared by the [place] FDA and has been authorized for detection and/or diagnosis of SARS-CoV-2 by FDA under an Emergency Use Authorization (EUA). This EUA will remain in effect (meaning this test can be used) for the duration of the COVID-19 declaration under Section 564(b)(1) of the Act, 21 U.S.C. section 360bbb-3(b)(1), unless the authorization is terminated or revoked.  Performed at [facility], [address], [place], [place] [address]      Radiology Studies: No results found.   Scheduled Meds:  ARIPiprazole  2 mg Oral Daily   aspirin EC  81 mg Oral Daily   atorvastatin  20 mg Oral Daily   Chlorhexidine Gluconate Cloth  6 each Topical Q0600   enoxaparin (LOVENOX) injection  40 mg Subcutaneous Q24H   furosemide  40 mg Oral Daily   metoprolol succinate  25 mg Oral Daily   mexiletine  150 mg Oral Q12H   polyethylene glycol  17 g Oral Daily   senna-docusate  1 tablet Oral BID   sodium chloride flush   3 mL Intravenous Q12H   sodium chloride flush  3 mL Intravenous Q12H   spironolactone  25 mg Oral Daily   Continuous Infusions:  promethazine (PHENERGAN) injection (IM or IVPB)       LOS: 9 days  Burnadette Pop, MD [facility] P3/27/2025, 12:18 PM

## 2023-06-28 NOTE — Plan of Care (Signed)
   Problem: Clinical Measurements: Goal: Respiratory complications will improve Outcome: Progressing   Problem: Activity: Goal: Risk for activity intolerance will decrease Outcome: Progressing   Problem: Coping: Goal: Level of anxiety will decrease Outcome: Progressing

## 2023-06-28 NOTE — Progress Notes (Signed)
 Physical Therapy Treatment Patient Details Name: [name] [name] MRN: [id number] DOB: June 07, 1950 Today's Date: 06/28/2023   History of Present Illness Pt is a 73 y.o. F admitted 06/18/23 with SOB and associated hypoxia. CTA chest neg for PE, had small bilat pleural effusions and small nodules. Pt was diuresed, found to be hypotensive, and received 500 cc of fluid. 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    PT Comments  Today's session focused on the administration of the DGI to assess the pt's ability to modify balance while walking in the presence of external demands. She performed the test without an AD and CGA. Pt scored a 11/24 indicating she is at increased risk of falls. She has an extensive fall history (26 in the past year) and will benefit from a RW to increase her stability during mobility. Patient will benefit from continued inpatient follow up therapy, <3 hours/day to restore her independence and safety.     If plan is discharge home, recommend the following: A little help with walking and/or transfers;A little help with bathing/dressing/bathroom;Assistance with cooking/housework;Assist for transportation;Help with stairs or ramp for entrance   Can travel by private vehicle     Yes  Equipment Recommendations  Rolling walker (2 wheels)    Recommendations for Other Services       Precautions / Restrictions Precautions Precautions: Fall Recall of Precautions/Restrictions: Intact Restrictions Weight Bearing Restrictions Per Provider Order: No     Mobility  Bed Mobility Overal bed mobility: Needs Assistance Bed Mobility: Supine to Sit     Supine to sit: HOB elevated, Used rails, Supervision     General bed mobility comments: Pt sat up on L side of bed with increased time and no physical assistance. VC for sequencing and to instruct pt to scoot fwd til feet were supported.    Transfers Overall transfer level: Needs assistance Equipment used:  None Transfers: Sit to/from Stand, Bed to chair/wheelchair/BSC Sit to Stand: Contact guard assist   Step pivot transfers: Contact guard assist       General transfer comment: Pt stood from lowest bed height by pushing up with BUE support. CGA at trunk for safety. She transferred to/from [facility] and relied on PT for pericare. Good eccentric control. Pt able to scoot back in recliner chair using BUE support on armrest.    Ambulation/Gait Ambulation/Gait assistance: Contact guard assist Gait Distance (Feet): 50 Feet (7x71ft, standing rest break after each task to give new instructions; 1x50) Assistive device: None Gait Pattern/deviations: Step-through pattern, Decreased stride length, Wide base of support, Drifts right/left Gait velocity: reduced Gait velocity interpretation: <1.8 ft/sec, indicate of risk for recurrent falls   General Gait Details: Pt ambulated within the hallway without an AD. She demonstrated a reciprocal gait pattern with adequate foot clearence and upright posture. Pt was unsteady, drifted R/L in the hallway. She tended to be too close to objects she was navigating around. Pt easily manuevered around her bed to sit in recliner chair.   Stairs             Wheelchair Mobility     Tilt Bed    Modified Rankin (Stroke Patients Only)       Balance Overall balance assessment: Needs assistance Sitting-balance support: No upper extremity supported, Feet supported Sitting balance-Leahy Scale: Fair     Standing balance support: No upper extremity supported, During functional activity Standing balance-Leahy Scale: Fair Standing balance comment: Pt unsteady with static and dynamic task without AD,  no overt LOB.                 Standardized Balance Assessment Standardized Balance Assessment : Dynamic Gait Index   Dynamic Gait Index Level Surface: Mild Impairment Change in Gait Speed: Moderate Impairment Gait with Horizontal Head Turns: Mild  Impairment Gait with Vertical Head Turns: Mild Impairment Gait and Pivot Turn: Moderate Impairment Step Over Obstacle: Moderate Impairment Step Around Obstacles: Mild Impairment Steps: Severe Impairment (Pt declined to go into the stairwell.) Total Score: 11      Communication Communication Communication: Impaired Factors Affecting Communication: Hearing impaired  Cognition Arousal: Alert Behavior During Therapy: WFL for tasks assessed/performed   PT - Cognitive impairments: No apparent impairments                         Following commands: Intact      Cueing Cueing Techniques: Verbal cues, Gestural cues  Exercises      General Comments General comments (skin integrity, edema, etc.): Pt greeted on 1L O2. Attempted to wean to RA, but she desaturated to 85% with mobility. She was unable to recover with standing rest or PLB techniques. Required 2L O2 for SpO2 >90%.      Pertinent Vitals/Pain Pain Assessment Pain Assessment: No/denies pain    Home Living                          Prior Function            PT Goals (current goals can now be found in the care plan section) Acute Rehab PT Goals Patient Stated Goal: Return Home Progress towards PT goals: Progressing toward goals    Frequency    Min 2X/week      PT Plan      Co-evaluation              AM-PAC PT "6 Clicks" Mobility   Outcome Measure  Help needed turning from your back to your side while in a flat bed without using bedrails?: A Little Help needed moving from lying on your back to sitting on the side of a flat bed without using bedrails?: A Little Help needed moving to and from a bed to a chair (including a wheelchair)?: A Little Help needed standing up from a chair using your arms (e.g., wheelchair or bedside chair)?: A Little Help needed to walk in hospital room?: A Little Help needed climbing 3-5 steps with a railing? : A Little 6 Click Score: 18    End of Session  Equipment Utilized During Treatment: Gait belt;Oxygen Activity Tolerance: Patient tolerated treatment well Patient left: in chair;with call bell/phone within reach;with chair alarm set Nurse Communication: Mobility status;Other (comment) (Desaturation with mobility) PT Visit Diagnosis: Muscle weakness (generalized) (M62.81);History of falling (Z91.81);Repeated falls (R29.6);Unsteadiness on feet (R26.81);Difficulty in walking, not elsewhere classified (R26.2);Other abnormalities of gait and mobility (R26.89)     Time: 1410-1437 PT Time Calculation (min) (ACUTE ONLY): 27 min  Charges:    $Gait Training: 23-37 mins PT General Charges $$ ACUTE PT VISIT: 1 Visit                     Cheri Guppy, PT, DPT Acute Rehabilitation Services Office: [phone] Secure Chat Preferred  [name] 06/28/2023, 4:15 PM

## 2023-06-28 NOTE — Plan of Care (Signed)
   Problem: Health Behavior/Discharge Planning: Goal: Ability to manage health-related needs will improve Outcome: Progressing   Problem: Clinical Measurements: Goal: Ability to maintain clinical measurements within normal limits will improve Outcome: Progressing Goal: Will remain free from infection Outcome: Progressing Goal: Diagnostic test results will improve Outcome: Progressing Goal: Respiratory complications will improve Outcome: Progressing

## 2023-06-28 NOTE — TOC Progression Note (Addendum)
 Transition of Care [facility]) - Progression Note    Patient Details  Name: [name] [name] MRN: [id number] Date of Birth: 10-Dec-1950  [facility] CM/SW Contact  Michaela Corner, [place] Phone Number: 06/28/2023, 11:10 AM  Clinical Narrative:   MD completed peer to peer and insurance asked for additional PT note to be submited by 9am tomorrow. PT notified and will pick up patient. [facility] has been contacted and updated regarding patients disposition.  TOC will continue to follow.    Expected Discharge Plan: Skilled Nursing Facility Barriers to Discharge: Continued Medical Work up, SNF Pending bed offer, Insurance Authorization  Expected Discharge Plan and Services In-house Referral: Clinical Social Work     Living arrangements for the past 2 months: Single Family Home                                       Social Determinants of Health (SDOH) Interventions SDOH Screenings   Food Insecurity: No Food Insecurity (06/19/2023)  Housing: Low Risk  (06/26/2023)  Transportation Needs: No Transportation Needs (06/26/2023)  Utilities: Not At Risk (06/19/2023)  Alcohol Screen: Low Risk  (06/26/2023)  Depression (PHQ2-9): Low Risk  (05/14/2023)  Recent Concern: Depression (PHQ2-9) - High Risk (05/14/2023)  Financial Resource Strain: Low Risk  (06/26/2023)  Physical Activity: Inactive (05/14/2023)  Social Connections: Socially Isolated (06/19/2023)  Stress: No Stress Concern Present (05/14/2023)  Tobacco Use: Medium Risk (06/18/2023)  Health Literacy: Adequate Health Literacy (05/14/2023)    Readmission Risk Interventions     No data to display

## 2023-06-29 DIAGNOSIS — J9601 Acute respiratory failure with hypoxia: Secondary | ICD-10-CM | POA: Diagnosis not present

## 2023-06-29 DIAGNOSIS — N1831 Chronic kidney disease, stage 3a: Secondary | ICD-10-CM

## 2023-06-29 DIAGNOSIS — F321 Major depressive disorder, single episode, moderate: Secondary | ICD-10-CM

## 2023-06-29 DIAGNOSIS — J4489 Other specified chronic obstructive pulmonary disease: Secondary | ICD-10-CM

## 2023-06-29 DIAGNOSIS — I5033 Acute on chronic diastolic (congestive) heart failure: Secondary | ICD-10-CM | POA: Diagnosis not present

## 2023-06-29 DIAGNOSIS — I25119 Atherosclerotic heart disease of native coronary artery with unspecified angina pectoris: Secondary | ICD-10-CM

## 2023-06-29 DIAGNOSIS — E782 Mixed hyperlipidemia: Secondary | ICD-10-CM

## 2023-06-29 DIAGNOSIS — I10 Essential (primary) hypertension: Secondary | ICD-10-CM

## 2023-06-29 LAB — BASIC METABOLIC PANEL WITH GFR
Anion gap: 7 (ref 5–15)
BUN: 24 mg/dL — ABNORMAL HIGH (ref 8–23)
CO2: 31 mmol/L (ref 22–32)
Calcium: 8.6 mg/dL — ABNORMAL LOW (ref 8.9–10.3)
Chloride: 101 mmol/L (ref 98–111)
Creatinine, Ser: 1.18 mg/dL — ABNORMAL HIGH (ref 0.44–1.00)
GFR, Estimated: 48 mL/min — ABNORMAL LOW (ref >60)
Glucose, Bld: 109 mg/dL — ABNORMAL HIGH (ref 70–99)
Potassium: 4.1 mmol/L (ref 3.5–5.1)
Sodium: 139 mmol/L (ref 135–145)

## 2023-06-29 MED ORDER — TAMSULOSIN HCL 0.4 MG PO CAPS
0.4000 mg | ORAL_CAPSULE | Freq: Every day | ORAL | Status: DC
Start: 2023-06-29 — End: 2023-07-06
  Administered 2023-06-29 – 2023-07-05 (×7): 0.4 mg via ORAL
  Filled 2023-06-29 (×8): qty 1

## 2023-06-29 MED ORDER — ALPRAZOLAM 0.5 MG PO TABS
0.7500 mg | ORAL_TABLET | Freq: Two times a day (BID) | ORAL | Status: DC | PRN
  Administered 2023-06-30 – 2023-07-05 (×8): 0.75 mg via ORAL
  Filled 2023-06-29 (×9): qty 1

## 2023-06-29 NOTE — Progress Notes (Signed)
 Heart Failure Stewardship Pharmacist Progress Note   PCP: [name], MD PCP-Cardiologist: Verne Carrow, MD    HPI:  73 y.o. female with PMHx of RA, GERD, allergic rhinitis, osteoarthritis, HLD, HTN, COPD, CAD, CKD (stage 3a), and depression/anxiety.  She presented to ED on 03/17 with complaints of worsening SOB. Patient reported she is typically SOB and has a non-productive cough at baseline due to COPD, but 3 days ago she noticed DOE with minimal activity. She also reported lower extremity swelling.  Initial vitals were 152/93, HR 84, RR 32. She was given 6 L O2 via [place] and later transitioned to BiPAP. BNP 715.9. She was given Lasix IV 40 mg prior to admission. CXR on 03/17 showed bilateral lower lobe airspace opacities could reflect atelectasis or pneumonia. Chest CT on  03/17 revealed no evidence of pulmonary embolism, scattered atelectasis or scarring bilaterally, small bilateral pleural effusions, scattered pulmonary nodules not significantly changed from 2022, multi-vessel coronary artery calcifications, aortic arthrosclerosis with aneurysmal dilation of the ascending aorta. She does follow cardiology for CAD. Her SO recently passed ~2 weeks ago. ECHO on 03/18 revealed LVEF of 60-65% (unchanged from 2020), LV has normal function, no regional wall abnormalities, LV G1 DD, signs of RV pressure and volume overload, MV is normal in structure, and right atrial pressure of 3 mmHg.   R/L HC on 03/21 revealed moderate pulmonary HTN (PA pressure 45 mmHg), CO 4 L/min, CI 2.2, RA 13, and wedge 20 mmHg.   Patient was transitioned off IV lasix on 03/19. Lisinopril 10 mg and Amlodipine 5 mg discontinued on 03/19.   She still has complaints of nausea and lightheadedness/dizziness. This comes and goes. It typically returns with sudden movement. She denied SOB, but did report some DOE/fatigue. Her urinary retention continues to be a problem. Her bladder does not feel overtly full, but she can  tell she still has some more urine to void even after attempting to use the bathroom on her own.   Current HF Medications: Diuretic: Furosemide 40 mg PO QD Beta Blocker: Metoprolol succinate 25 mg PO every day MRA: Spironolactone 25 mg every day  Prior to admission HF Medications: Beta blocker: Metoprolol succinate 50 mg every day (last dispensed 05/2023 for 90 ds) ACE/ARB/ARNI: Lisinopril 10 mg every day (last dispensed 05/2023 for 90 ds) Other: Amlodipine 5 mg every day (last dispensed 06/2023 for 90 ds) and Imdur 30 mg every day (patient reported not taking; last dispensed 06/2023 for 90 ds)  Pertinent Lab Values (as of 03/26) Serum creatinine: 1.18(<1.16<1.06), BUN 24(<25<22), Potassium 4.1(<4.1<4.1), Sodium 139(<138<140), BNP 715.9, Magnesium (<2.2<1.6), A1c 5.8 (05/2023) Vital Signs: Weight: 191.6 lbs (admission weight: 190.9 lbs<- likely a guess) bed wt Blood pressure: 110/57-148/80 (112/72 @ 0714) Heart rate: 29-98 (79 @ 0714) I/O: net +0.27 L yesterday; net -3.3 L since admission  Medication Assistance / Insurance Benefits Check: Does the patient have prescription insurance?  Yes Type of insurance plan: UHC Medicare  Does the patient qualify for medication assistance through manufacturers or grants?   Yes  Outpatient Pharmacy:  Prior to admission outpatient pharmacy:  [id number] - [place], [place] - [address]   Is the patient willing to use [facility] TOC pharmacy at discharge? Yes Is the patient willing to transition their outpatient pharmacy to utilize a [facility] outpatient pharmacy?   No    Assessment: 1. Acute on chronic HFpEF (LVEF 60-65%). NYHA class I symptoms. -Strict I/Os and daily weights. Keep K>4, Mg>2 -Continue Furosemide 40 mg  every day -Continue metoprolol succinate at 25 mg every day. Continue to monitor for signs and symptoms of hypotension-may need to reduce dose if symptoms continue to persist -Continue Spironolactone 25 mg every day  -Due to  persistent lightheadedness/dizziness consider discontinuing Lisinopril at discharge -Poor candidate for SGLT2i d/t urinary retention  Plan: 1) Medication changes recommended at this time: -Continue metoprolol succinate at 25 mg every day. Continue to monitor for signs and symptoms of hypotension-may need to reduce dose if symptoms continue to persist -Due to persistent lightheadedness/dizziness consider discontinuing Lisinopril at discharge  2) Patient assistance: -She is agreeable to using [facility] TOC pharmacy at discharge  3)  Education  - To be completed prior to discharge  Sofie Rower, PharmD [employer] PGY-1

## 2023-06-29 NOTE — Progress Notes (Signed)
 PROGRESS NOTE  [name] [name]  ZOX:[id number] DOB: 04/11/1950 DOA: 06/18/2023 PCP: [name], MD   Brief Narrative: Patient is a 73 year old female with history of coronary artery disease, frequent PVCs on flecainide, COPD, CKD stage IIIa, hypertension, hyperlipidemia, depression/anxiety who presented to the emergency department with complaint of shortness of breath, exertional dyspnea, swelling of bilateral lower extremity.  On presentation ,she was noted to be tachypneic with increased work of breathing, had to put on BiPAP.  COVID/flu/RSV negative.  Chest lower lobe bilateral lower lobe airspace opacities.  CTA chest ruled out PE.  BNP was elevated.  Patient was admitted for the management of acute on chronic HFpEF.  Cardiology was consulted and following.  Clinically improved now.  PT/OT recommending SNF.  Peer-to-peer done today, medical director asking for updated  PT note.  Medically stable for discharge whenever possible  3/28: Insurance denied SNF even with peer to peer, daughter is planning to appeal.  Assessment & Plan:  Principal Problem:   Acute respiratory failure with hypoxia (HCC) Active Problems:   Hyperlipemia, mixed   Depression, major, single episode, moderate (HCC)   Essential hypertension   COPD (chronic obstructive pulmonary disease) with chronic bronchitis (HCC)   Coronary artery disease involving native coronary artery of native heart with angina pectoris (HCC)   Chronic kidney disease, stage 3a (HCC)   Acute on chronic heart failure with preserved ejection fraction (HFpEF) (HCC)   Acute hypoxic respiratory failure secondary to CHF exacerbation:   Hypoxic on arrival, had to put on BiPAP, now weaned to room air.  Continue to wean the oxygen.  CTA negative for PE.  Chest x-ray showed lower lobe opacities likely from pulm edema.  Continue bronchodilators as needed, incentive spirometer.  On 2 L of oxygen this morning.  She will most likely needs oxygen  chronically.  Can continue oxygen supplementation at SNF  Acute on chronic HFpEF/pulmonary hypertension: Presented with bilateral lower extremity edema, dyspnea on exertion.elevated BNP.  She was on IV Lasix.  Echocardiogram done here showed preserved EF, grade 1 diastolic dysfunction, pulmonary hypertension.  Cardiology did cardiac cath which showed diffuse coronary disease but no target for PCI.  Current plan is for medical management.  Also found to have moderate pulmonary hypertension.  She should follow-up with cardiology as an outpatient for management of pulmonary hypertension.  Started on oral Lasix  COPD: Currently without wheezing.  Continue bronchodilators as needed  Coronary artery disease/elevated troponin: Declines any chest pain at present.  Troponins mildly elevated.  Echo did not show any wall motion abnormality. cardiac cath which showed diffuse coronary disease but no target for PCI.  Current plan is for medical management.  Pulmonary nodules: Seen on CT.  Follow-up recommended with CT chest in a year  History of frequent symptomatic PVCs: On flecainide, Toprol at home.  Flecainide stopped.  Continue metoprolol, mexiletine  History of hypertension: Hospital course remarkable for hypotension.  She was on metoprolol, amlodipine, lisinopril at home.these meds were held due to hypotension, given IV fluids. Currently blood pressure stable.  Restarted metoprolol  AKI on CKD stage IIIb: Baseline creatinine around 1.1-1.5.  Currently kidney function at baseline.  Hyperlipidemia: Currently not on statin  Depression/anxiety: On Abilify  Debility/deconditioning: Patient seen by PT/OT and recommended SNF on discharge.  TOC following  DVT prophylaxis:enoxaparin (LOVENOX) injection 40 mg Start: 06/23/23 1000     Code Status: Do not attempt resuscitation (DNR) PRE-ARREST INTERVENTIONS DESIRED  Family Communication: None at the bedside  Patient status:Inpatient  Patient is from  :Home  Anticipated discharge to:SNF  Estimated DC date:whenever possible, pending appeal due to initial insurance denial.   Consultants: Cardiology  Procedures:Cardiac cath  Antimicrobials:  Anti-infectives (From admission, onward)    None       Subjective: Patient was seen and examined today.  No new concern but she was very frustrated with insurance and SNF issues.  Objective: Vitals:   06/29/23 0341 06/29/23 0342 06/29/23 0714 06/29/23 1037  BP:   112/72 103/61  Pulse: (!) 29 80 79 81  Resp: (!) 27 (!) 25 20 17   Temp:   97.8 F (36.6 C) (!) 97.5 F (36.4 C)  TempSrc:   Oral Oral  SpO2: 95% 95% 94% 96%  Weight:      Height:        Intake/Output Summary (Last 24 hours) at 06/29/2023 1214 Last data filed at 06/29/2023 1132 Gross per 24 hour  Intake 843 ml  Output 1051 ml  Net -208 ml   Filed Weights   06/27/23 0413 06/28/23 0112 06/29/23 0338  Weight: 86.2 kg 87.1 kg 86.9 kg    Examination: General.  Obese elderly lady, in no acute distress. Pulmonary.  Lungs clear bilaterally, normal respiratory effort. CV.  Regular rate and rhythm, no JVD, rub or murmur. Abdomen.  Soft, nontender, nondistended, BS positive. CNS.  Alert and oriented .  No focal neurologic deficit. Extremities.  No edema, no cyanosis, pulses intact and symmetrical. Psychiatry.  Judgment and insight appears normal.    Data Reviewed: I have personally reviewed following labs and imaging studies  CBC: Recent Labs  Lab 06/22/23 1641 06/22/23 1647  HGB 13.6 13.6  HCT 40.0 40.0   Basic Metabolic Panel: Recent Labs  Lab 06/22/23 1647 06/23/23 0246 06/26/23 0303 06/27/23 0315 06/29/23 0301  NA 141 139 140 138 139  K 4.2 4.5 4.1 4.1 4.1  CL  --  106 100 97* 101  CO2  --  27 31 31 31   GLUCOSE  --  103* 98 98 109*  BUN  --  30* 22 25* 24*  CREATININE  --  1.18* 1.06* 1.16* 1.18*  CALCIUM  --  8.0* 8.6* 8.9 8.6*  MG  --   --  1.6* 2.2  --      No results found for this or any  previous visit (from the past 240 hours).    Radiology Studies: No results found.   Scheduled Meds:  ARIPiprazole  2 mg Oral Daily   aspirin EC  81 mg Oral Daily   atorvastatin  20 mg Oral Daily   Chlorhexidine Gluconate Cloth  6 each Topical Q0600   enoxaparin (LOVENOX) injection  40 mg Subcutaneous Q24H   furosemide  40 mg Oral Daily   metoprolol succinate  25 mg Oral Daily   mexiletine  150 mg Oral Q12H   polyethylene glycol  17 g Oral Daily   senna-docusate  1 tablet Oral BID   sodium chloride flush  3 mL Intravenous Q12H   spironolactone  25 mg Oral Daily   Continuous Infusions:  promethazine (PHENERGAN) injection (IM or IVPB)       LOS: 10 days   Arnetha Courser, MD [facility] 06/29/2023, 12:14 PM

## 2023-06-29 NOTE — Progress Notes (Signed)
 Occupational Therapy Treatment Patient Details Name: [name] [name] MRN: [id number] DOB: 1951-03-17 Today's Date: 06/29/2023   History of present illness Pt is a 73 y.o. F admitted 06/18/23 with SOB and associated hypoxia. CTA chest neg for PE, had small bilat pleural effusions and small nodules. Pt was diuresed, found to be hypotensive, and received 500 cc of fluid. 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   OT comments  Pt continues to demonstrated decreased activity tolerance with OOB mobility, reinforced use of standing rest breaks and pursed lip breathing prn. Pt trialed rollator and was able to ambulate 70ft with CGA today using energy conservation strategies, she does that that none of the DME are small enough to fit through her walkways. Pt needed mod A for LBD due to listed deficit today. OT to continue to progress pt as able, DC plans remain appropriate for skilled rehab <3hrs of inpatient therapy.       If plan is discharge home, recommend the following:  A little help with walking and/or transfers;A little help with bathing/dressing/bathroom;Assistance with cooking/housework;Assist for transportation;Help with stairs or ramp for entrance   Equipment Recommendations  Other (comment) (recommend tub seat; pt unable to afford items not covered by insurance)    Recommendations for Other Services      Precautions / Restrictions Precautions Precautions: Fall Recall of Precautions/Restrictions: Intact Restrictions Weight Bearing Restrictions Per Provider Order: No       Mobility Bed Mobility Overal bed mobility: Needs Assistance Bed Mobility: Supine to Sit, Sit to Supine     Supine to sit: HOB elevated, Used rails, Supervision Sit to supine: Supervision, HOB elevated, Used rails        Transfers Overall transfer level: Needs assistance Equipment used: Rollator (4 wheels) Transfers: Sit to/from Stand Sit to Stand: Contact guard assist                  Balance Overall balance assessment: Needs assistance Sitting-balance support: No upper extremity supported, Feet supported Sitting balance-Leahy Scale: Fair     Standing balance support: No upper extremity supported, During functional activity Standing balance-Leahy Scale: Fair                             ADL either performed or assessed with clinical judgement   ADL               Lower Body Bathing: Sitting/lateral leans;Minimal assistance       Lower Body Dressing: Sitting/lateral leans;Moderate assistance;Sit to/from stand (don socks, pt not able to bend at waist without becoming nauseous)   Toilet Transfer: Contact guard assist;Rollator (4 wheels);Ambulation   Toileting- Clothing Manipulation and Hygiene: Sitting/lateral lean;Set up       Functional mobility during ADLs: Contact guard assist;Rollator (4 wheels) General ADL Comments: Educated pt on the use of rollator, 1 standing rest break needed to travel ~58ft    Extremity/Trunk Assessment              Vision       Perception     Praxis     Communication Communication Communication: Impaired Factors Affecting Communication: Hearing impaired   Cognition Arousal: Alert Behavior During Therapy: WFL for tasks assessed/performed Cognition: Cognition impaired     Awareness: Online awareness impaired     Executive functioning impairment (select all impairments): Reasoning  Following commands: Intact        Cueing   Cueing Techniques: Verbal cues, Gestural cues  Exercises      Shoulder Instructions       General Comments pPt sp02 >90% on 1L with ambulation, Sp02 increases >93% with standing rest breaks and use of pursed lip breathing.    Pertinent Vitals/ Pain       Pain Assessment Pain Assessment: Faces Faces Pain Scale: Hurts little more Pain Location: abdomen Pain Descriptors / Indicators: Aching, Discomfort Pain Intervention(s):  Limited activity within patient's tolerance, Monitored during session  Home Living                                          Prior Functioning/Environment              Frequency  Min 2X/week        Progress Toward Goals  OT Goals(current goals can now be found in the care plan section)  Progress towards OT goals: Progressing toward goals  Acute Rehab OT Goals OT Goal Formulation: With patient Time For Goal Achievement: 07/05/23 Potential to Achieve Goals: Good  Plan      Co-evaluation                 AM-PAC OT "6 Clicks" Daily Activity     Outcome Measure   Help from another person eating meals?: None Help from another person taking care of personal grooming?: A Little Help from another person toileting, which includes using toliet, bedpan, or urinal?: A Little Help from another person bathing (including washing, rinsing, drying)?: A Little Help from another person to put on and taking off regular upper body clothing?: A Little Help from another person to put on and taking off regular lower body clothing?: A Lot 6 Click Score: 18    End of Session Equipment Utilized During Treatment: Gait belt;Rollator (4 wheels);Oxygen (1L)  OT Visit Diagnosis: Muscle weakness (generalized) (M62.81);Other symptoms and signs involving cognitive function   Activity Tolerance Patient tolerated treatment well   Patient Left in bed;with call bell/phone within reach;with bed alarm set   Nurse Communication Mobility status;Other (comment)        Time: 1721-1750 OT Time Calculation (min): 29 min  Charges: OT General Charges $OT Visit: 1 Visit OT Treatments $Therapeutic Activity: 23-37 mins  06/29/2023  AB, OTR/L  Acute Rehabilitation Services  Office: [phone]   Tristan Schroeder 06/29/2023, 6:03 PM

## 2023-06-29 NOTE — TOC Progression Note (Signed)
 Transition of Care [facility]) - Progression Note    Patient Details  Name: [name] [name] MRN: [id number] Date of Birth: July 25, 1950  Transition of Care [facility]) CM/SW Contact  Graves-Bigelow, Lamar Laundry, RN Phone Number: 06/29/2023, 3:28 PM  Clinical Narrative: Case Manager spoke with patients step daughter regarding questions related to home health services. Previous Case Manager has arranged home health with [facility]. From reviewing PT/OT notes patient will benefit from personal care services; however, the cost ranges from $25-35.00 an hour. FC is following for possible Medicaid. Case manager did speak with patient and she gets groceries via [employer] and has limited support from neighbors. Patient states she has a half bath downstairs that is now functioning, bedroom and full bath are upstairs with 13 steps to climb. Case Manager did ask patient could she safely stay on the bottom floor if a bed was placed in the living room; patient states she has nowhere to place the extra furniture. If the plan is for home PT/OT will need to work with the patient on steps. Case Manager will continue to follow for additional needs as the patient progresses.   Expected Discharge Plan: Skilled Nursing Facility Barriers to Discharge: Continued Medical Work up, SNF Pending bed offer, Insurance Authorization  Expected Discharge Plan and Services In-house Referral: Clinical Social Work     Living arrangements for the past 2 months: Single Family Home                                       Social Determinants of Health (SDOH) Interventions SDOH Screenings   Food Insecurity: No Food Insecurity (06/19/2023)  Housing: Low Risk  (06/26/2023)  Transportation Needs: No Transportation Needs (06/26/2023)  Utilities: Not At Risk (06/19/2023)  Alcohol Screen: Low Risk  (06/26/2023)  Depression (PHQ2-9): Low Risk  (05/14/2023)  Recent Concern: Depression (PHQ2-9) - High Risk (05/14/2023)  Financial Resource Strain:  Low Risk  (06/26/2023)  Physical Activity: Inactive (05/14/2023)  Social Connections: Socially Isolated (06/19/2023)  Stress: No Stress Concern Present (05/14/2023)  Tobacco Use: Medium Risk (06/18/2023)  Health Literacy: Adequate Health Literacy (05/14/2023)    Readmission Risk Interventions     No data to display

## 2023-06-29 NOTE — TOC Progression Note (Addendum)
 Transition of Care [facility]) - Progression Note    Patient Details  Name: [name] [name] MRN: [id number] Date of Birth: [birth year]  [facility]) CM/SW Contact  Michaela Corner, [place] Phone Number: 06/29/2023, 12:13 PM  Clinical Narrative:   10:58 AM Peer to peer completed yesterday; insurance [employer] has been denied at this time. CSW will provide patient with fast appeal number ([phone]). CSW notified MD and RNCM.   11:26AM: CSW met with patient and provided her with fast appeal number for insurance denial of SNF. Patient asked CSW to call her step dtr, Wynona Canes, to inform her of insurance decision and see what next steps should be. CSW called Wynona Canes and informed her of insurance denial, Wynona Canes stated she will call to do fast appeal but if we need to look at [facility] for patient then that is what they'll have to do. Patient is worried about discharging home and states she has no help at home. CSW notified RNCM to discuss HH, per patients request.   1:07 PM CSW spoke with Wynona Canes regarding patients discharge plan. Wynona Canes stated that she completed the Fast Appeal and that insurance will reach back out to hospital regarding updated clinicals being faxed to [phone]. Per insurance, itn takes 72 hours for fast appeal to be completed. Patient and Wynona Canes are hesitant for patient to DC home due to previous hx of falls. CSW notfied RNCM and MD.   TOC will continue to follow.    Expected Discharge Plan: Skilled Nursing Facility Barriers to Discharge: Continued Medical Work up, SNF Pending bed offer, Insurance Authorization  Expected Discharge Plan and Services In-house Referral: Clinical Social Work     Living arrangements for the past 2 months: Single Family Home                                       Social Determinants of Health (SDOH) Interventions SDOH Screenings   Food Insecurity: No Food Insecurity (06/19/2023)  Housing: Low Risk  (06/26/2023)   Transportation Needs: No Transportation Needs (06/26/2023)  Utilities: Not At Risk (06/19/2023)  Alcohol Screen: Low Risk  (06/26/2023)  Depression (PHQ2-9): Low Risk  (05/14/2023)  Recent Concern: Depression (PHQ2-9) - High Risk (05/14/2023)  Financial Resource Strain: Low Risk  (06/26/2023)  Physical Activity: Inactive (05/14/2023)  Social Connections: Socially Isolated (06/19/2023)  Stress: No Stress Concern Present (05/14/2023)  Tobacco Use: Medium Risk (06/18/2023)  Health Literacy: Adequate Health Literacy (05/14/2023)    Readmission Risk Interventions     No data to display

## 2023-06-30 DIAGNOSIS — J4489 Other specified chronic obstructive pulmonary disease: Secondary | ICD-10-CM | POA: Diagnosis not present

## 2023-06-30 DIAGNOSIS — I5033 Acute on chronic diastolic (congestive) heart failure: Secondary | ICD-10-CM | POA: Diagnosis not present

## 2023-06-30 DIAGNOSIS — N1831 Chronic kidney disease, stage 3a: Secondary | ICD-10-CM | POA: Diagnosis not present

## 2023-06-30 DIAGNOSIS — J9601 Acute respiratory failure with hypoxia: Secondary | ICD-10-CM | POA: Diagnosis not present

## 2023-06-30 MED ORDER — BISMUTH SUBSALICYLATE 262 MG/15ML PO SUSP
30.0000 mL | ORAL | Status: DC | PRN
  Filled 2023-06-30: qty 236

## 2023-06-30 MED ORDER — PROMETHAZINE HCL 12.5 MG PO TABS
12.5000 mg | ORAL_TABLET | Freq: Four times a day (QID) | ORAL | Status: DC | PRN
  Administered 2023-06-30 – 2023-07-03 (×3): 12.5 mg via ORAL
  Filled 2023-06-30 (×6): qty 1

## 2023-06-30 NOTE — Plan of Care (Signed)
  Problem: Activity: Goal: Risk for activity intolerance will decrease Outcome: Progressing Note: Unwilling to walk in the hall or go further than the [facility]

## 2023-06-30 NOTE — Progress Notes (Signed)
 PROGRESS NOTE  [name] [name]  WUJ:[id number] DOB: [birth year] DOA: 06/18/2023 PCP: [name], MD   Brief Narrative: Patient is a 73 year old female with history of coronary artery disease, frequent PVCs on flecainide, COPD, CKD stage IIIa, hypertension, hyperlipidemia, depression/anxiety who presented to the emergency department with complaint of shortness of breath, exertional dyspnea, swelling of bilateral lower extremity.  On presentation ,she was noted to be tachypneic with increased work of breathing, had to put on BiPAP.  COVID/flu/RSV negative.  Chest lower lobe bilateral lower lobe airspace opacities.  CTA chest ruled out PE.  BNP was elevated.  Patient was admitted for the management of acute on chronic HFpEF.  Cardiology was consulted and following.  Clinically improved now.  PT/OT recommending SNF.  Peer-to-peer done today, medical director asking for updated  PT note.  Medically stable for discharge whenever possible  3/28: Insurance denied SNF even with peer to peer, daughter is planning to appeal.  3/29: Pending appeal.  Assessment & Plan:  Principal Problem:   Acute respiratory failure with hypoxia (HCC) Active Problems:   Hyperlipemia, mixed   Depression, major, single episode, moderate (HCC)   Essential hypertension   COPD (chronic obstructive pulmonary disease) with chronic bronchitis (HCC)   Coronary artery disease involving native coronary artery of native heart with angina pectoris (HCC)   Chronic kidney disease, stage 3a (HCC)   Acute on chronic heart failure with preserved ejection fraction (HFpEF) (HCC)   Acute hypoxic respiratory failure secondary to CHF exacerbation:   Hypoxic on arrival, had to put on BiPAP, now weaned to room air.  Continue to wean the oxygen.  CTA negative for PE.  Chest x-ray showed lower lobe opacities likely from pulm edema.  Continue bronchodilators as needed, incentive spirometer.  On 2 L of oxygen this morning.  She will most likely  needs oxygen chronically.  Can continue oxygen supplementation at SNF  Acute on chronic HFpEF/pulmonary hypertension: Presented with bilateral lower extremity edema, dyspnea on exertion.elevated BNP.  She was on IV Lasix.  Echocardiogram done here showed preserved EF, grade 1 diastolic dysfunction, pulmonary hypertension.  Cardiology did cardiac cath which showed diffuse coronary disease but no target for PCI.  Current plan is for medical management.  Also found to have moderate pulmonary hypertension.  She should follow-up with cardiology as an outpatient for management of pulmonary hypertension.  Started on oral Lasix  COPD: Currently without wheezing.  Continue bronchodilators as needed  Coronary artery disease/elevated troponin: Declines any chest pain at present.  Troponins mildly elevated.  Echo did not show any wall motion abnormality. cardiac cath which showed diffuse coronary disease but no target for PCI.  Current plan is for medical management.  Pulmonary nodules: Seen on CT.  Follow-up recommended with CT chest in a year  History of frequent symptomatic PVCs: On flecainide, Toprol at home.  Flecainide stopped.  Continue metoprolol, mexiletine  History of hypertension: Hospital course remarkable for hypotension.  She was on metoprolol, amlodipine, lisinopril at home.these meds were held due to hypotension, given IV fluids. Currently blood pressure stable.  Restarted metoprolol  AKI on CKD stage IIIb: Baseline creatinine around 1.1-1.5.  Currently kidney function at baseline.  Hyperlipidemia: Currently not on statin  Depression/anxiety: On Abilify  Debility/deconditioning: Patient seen by PT/OT and recommended SNF on discharge.  TOC following  DVT prophylaxis:enoxaparin (LOVENOX) injection 40 mg Start: 06/23/23 1000     Code Status: Do not attempt resuscitation (DNR) PRE-ARREST INTERVENTIONS DESIRED  Family Communication: None at the  bedside  Patient  status:Inpatient  Patient is from :Home  Anticipated discharge to:SNF  Estimated DC date:whenever possible, pending appeal due to initial insurance denial.   Consultants: Cardiology  Procedures:Cardiac cath  Antimicrobials:  Anti-infectives (From admission, onward)    None       Subjective: Patient was sitting comfortably in chair when seen today.  She was little upset that nursing staff is taking very long to come when she pressed the button to use the bathroom.  Objective: Vitals:   06/30/23 0327 06/30/23 0435 06/30/23 0932 06/30/23 1211  BP: 110/70  117/69 130/88  Pulse: 83  85 85  Resp: 18  (!) 26 (!) 30  Temp: 98.2 F (36.8 C)  97.8 F (36.6 C) 97.9 F (36.6 C)  TempSrc: Oral  Oral Oral  SpO2: 94%  94% 96%  Weight:  86.8 kg    Height:        Intake/Output Summary (Last 24 hours) at 06/30/2023 1237 Last data filed at 06/30/2023 0747 Gross per 24 hour  Intake 720 ml  Output 1170 ml  Net -450 ml   Filed Weights   06/28/23 0112 06/29/23 0338 06/30/23 0435  Weight: 87.1 kg 86.9 kg 86.8 kg    Examination: General.  Obese elderly lady, in no acute distress. Pulmonary.  Lungs clear bilaterally, normal respiratory effort. CV.  Regular rate and rhythm, no JVD, rub or murmur. Abdomen.  Soft, nontender, nondistended, BS positive. CNS.  Alert and oriented .  No focal neurologic deficit. Extremities.  No edema, no cyanosis, pulses intact and symmetrical.   Data Reviewed: I have personally reviewed following labs and imaging studies  CBC: No results for input(s): "WBC", "NEUTROABS", "HGB", "HCT", "MCV", "PLT" in the last 168 hours.  Basic Metabolic Panel: Recent Labs  Lab 06/26/23 0303 06/27/23 0315 06/29/23 0301  NA 140 138 139  K 4.1 4.1 4.1  CL 100 97* 101  CO2 31 31 31   GLUCOSE 98 98 109*  BUN 22 25* 24*  CREATININE 1.06* 1.16* 1.18*  CALCIUM 8.6* 8.9 8.6*  MG 1.6* 2.2  --      No results found for this or any previous visit (from the past  240 hours).    Radiology Studies: No results found.   Scheduled Meds:  ARIPiprazole  2 mg Oral Daily   aspirin EC  81 mg Oral Daily   atorvastatin  20 mg Oral Daily   Chlorhexidine Gluconate Cloth  6 each Topical Q0600   enoxaparin (LOVENOX) injection  40 mg Subcutaneous Q24H   furosemide  40 mg Oral Daily   metoprolol succinate  25 mg Oral Daily   mexiletine  150 mg Oral Q12H   polyethylene glycol  17 g Oral Daily   senna-docusate  1 tablet Oral BID   sodium chloride flush  3 mL Intravenous Q12H   spironolactone  25 mg Oral Daily   tamsulosin  0.4 mg Oral QPC supper   Continuous Infusions:     LOS: 11 days   Arnetha Courser, MD [facility] 06/30/2023, 12:37 PM

## 2023-07-01 DIAGNOSIS — J9601 Acute respiratory failure with hypoxia: Secondary | ICD-10-CM | POA: Diagnosis not present

## 2023-07-01 NOTE — TOC Progression Note (Signed)
 Transition of Care [facility]) - Progression Note    Patient Details  Name: [name] [name] MRN: [id number] Date of Birth: July 12, 1950  [facility]) CM/SW Contact  Patrice Paradise, LCSW Phone Number: 07/01/2023, 10:18 AM  Clinical Narrative:     CSW uploaded update OT note in the [employer] [facility].  TOC team will continue to assist with discharge planning needs.   Expected Discharge Plan: Skilled Nursing Facility Barriers to Discharge: Continued Medical Work up, SNF Pending bed offer, Insurance Authorization  Expected Discharge Plan and Services In-house Referral: Clinical Social Work     Living arrangements for the past 2 months: Single Family Home                                       Social Determinants of Health (SDOH) Interventions SDOH Screenings   Food Insecurity: No Food Insecurity (06/19/2023)  Housing: Low Risk  (06/26/2023)  Transportation Needs: No Transportation Needs (06/26/2023)  Utilities: Not At Risk (06/19/2023)  Alcohol Screen: Low Risk  (06/26/2023)  Depression (PHQ2-9): Low Risk  (05/14/2023)  Recent Concern: Depression (PHQ2-9) - High Risk (05/14/2023)  Financial Resource Strain: Low Risk  (06/26/2023)  Physical Activity: Inactive (05/14/2023)  Social Connections: Socially Isolated (06/19/2023)  Stress: No Stress Concern Present (05/14/2023)  Tobacco Use: Medium Risk (06/18/2023)  Health Literacy: Adequate Health Literacy (05/14/2023)    Readmission Risk Interventions     No data to display

## 2023-07-01 NOTE — Plan of Care (Signed)
   Problem: Clinical Measurements: Goal: Ability to maintain clinical measurements within normal limits will improve Outcome: Progressing   Problem: Coping: Goal: Level of anxiety will decrease Outcome: Progressing

## 2023-07-01 NOTE — Progress Notes (Signed)
 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  CTA chest ruled out PE.  BNP was elevated.  Patient was admitted for the management of acute on chronic HFpEF.  Cardiology was consulted and following.  Clinically improved now.  PT/OT recommending SNF.  Insurance declined SNF, appeal pending    Assessment & Plan:  Acute hypoxic respiratory failure secondary to CHF exacerbation:    -Hypoxic on arrival, required BiPAP on admission -Improved, down to 1 L now, attempt to wean off as tolerated   Acute on chronic HFpEF/pulmonary hypertension: -Echo noted preserved EF, grade 1 DD, pulmonary hypertension -followed by cardiology, LHC noted diffuse disease with no targets for PCI, medical management recommended  -Diuresed with IV Lasix, volume status has improved, now on oral Lasix and Aldactone -Follow-up with [facility]   COPD: -Continue bronchodilators as needed   Coronary artery disease/elevated troponin -Troponins mildly elevated.  Echo did not show any wall motion abnormality. cardiac cath which showed diffuse coronary disease but no target for PCI.  Current plan is for medical management. -Continue aspirin, Toprol and atorvastatin   Pulmonary nodules: Seen on CT.  Follow-up recommended with CT chest in a year   History of frequent symptomatic PVCs: On flecainide, Toprol at home.  Flecainide stopped.  Continue metoprolol, mexiletine   History of hypertension: Hospital course remarkable for hypotension. -Antihypertensives held briefly, now Toprol resumed, BP stable   AKI on CKD stage IIIb: Baseline creatinine around 1.1-1.5.  Currently kidney function at baseline.   Hyperlipidemia:  Currently not on statin   Depression/anxiety: On Abilify   Debility/deconditioning: Patient seen by PT/OT and recommended SNF on discharge.  TOC following      DVT prophylaxis: Lovenox    Code Status: DNR  Family Communication: None Consultants:   Procedures:  Subjective: Feels okay, no events overnight, waiting to hear from social work   Objective: Vitals:   06/28/23 0416 06/28/23 0417 06/28/23 0725 06/28/23 1028  BP: 122/72  133/81 131/73  Pulse: 78 78 88 84  Resp: 16 19 20 20   Temp: 98.6 F (37 C)  97.8 F (36.6 C) 97.9 F (36.6 C)  TempSrc: Oral  Oral Oral  SpO2: 92% 93% 94% 97%  Weight:      Height:        Intake/Output Summary (Last 24 hours) at 06/28/2023 1218 Last data filed at 06/28/2023 0925 Gross per 24 hour  Intake 363 ml  Output 201 ml  Net 162 ml   Filed Weights   06/26/23 0353 06/27/23 0413 06/28/23 0112  Weight: 88.7 kg 86.2 kg 87.1 kg    Examination:  Gen: Awake, Alert, Oriented X 3,  HEENT: no JVD Lungs: Good air movement bilaterally, CTAB CVS: S1S2/RRR Abd: soft, Non tender, non distended, BS present Extremities: No edema Skin: no new rashes on exposed skin   Data Reviewed:   CBC: No results for input(s): "WBC", "NEUTROABS", "HGB", "HCT", "MCV", "PLT" in the last 168 hours. Basic Metabolic Panel: Recent Labs  Lab 06/26/23 0303 06/27/23 0315 06/29/23 0301  NA 140 138 139  K 4.1 4.1 4.1  CL 100 97* 101  CO2 31 31 31   GLUCOSE 98 98 109*  BUN 22 25* 24*  CREATININE 1.06*  1.16* 1.18*  CALCIUM 8.6* 8.9 8.6*  MG 1.6* 2.2  --    GFR: Estimated Creatinine Clearance: 40.5 mL/min (A) (by C-G formula based on SCr of 1.18 mg/dL (H)). Liver Function Tests: No results for input(s): "AST", "ALT", "ALKPHOS", "BILITOT", "PROT", "ALBUMIN" in the last 168 hours. No results for input(s): "LIPASE", "AMYLASE" in the last 168 hours. No results for input(s): "AMMONIA" in the last 168 hours. Coagulation Profile: No results for input(s): "INR",  "PROTIME" in the last 168 hours. Cardiac Enzymes: No results for input(s): "CKTOTAL", "CKMB", "CKMBINDEX", "TROPONINI" in the last 168 hours. BNP (last 3 results) No results for input(s): "PROBNP" in the last 8760 hours. HbA1C: No results for input(s): "HGBA1C" in the last 72 hours. CBG: No results for input(s): "GLUCAP" in the last 168 hours. Lipid Profile: No results for input(s): "CHOL", "HDL", "LDLCALC", "TRIG", "CHOLHDL", "LDLDIRECT" in the last 72 hours. Thyroid Function Tests: No results for input(s): "TSH", "T4TOTAL", "FREET4", "T3FREE", "THYROIDAB" in the last 72 hours. Anemia Panel: No results for input(s): "VITAMINB12", "FOLATE", "FERRITIN", "TIBC", "IRON", "RETICCTPCT" in the last 72 hours. Urine analysis:    Component Value Date/Time   COLORURINE YELLOW 05/14/2015 1432   APPEARANCEUR CLEAR 05/14/2015 1432   LABSPEC 1.021 05/14/2015 1432   PHURINE 6.5 05/14/2015 1432   GLUCOSEU NEGATIVE 05/14/2015 1432   HGBUR NEGATIVE 05/14/2015 1432   HGBUR negative 12/20/2009 0851   BILIRUBINUR n 01/07/2018 1434   KETONESUR NEGATIVE 05/14/2015 1432   PROTEINUR Negative 01/07/2018 1434   PROTEINUR NEGATIVE 05/14/2015 1432   UROBILINOGEN 0.2 01/07/2018 1434   UROBILINOGEN 0.2 10/07/2014 1632   NITRITE n 01/07/2018 1434   NITRITE NEGATIVE 05/14/2015 1432   LEUKOCYTESUR Negative 01/07/2018 1434   Sepsis Labs: @LABRCNTIP (procalcitonin:4,lacticidven:4)  )No results found for this or any previous visit (from the past 240 hours).   Radiology Studies: No results found.   Scheduled Meds:  ARIPiprazole  2 mg Oral Daily   aspirin EC  81 mg Oral Daily   atorvastatin  20 mg Oral Daily   Chlorhexidine Gluconate Cloth  6 each Topical Q0600   enoxaparin (LOVENOX) injection  40 mg Subcutaneous Q24H   furosemide  40 mg Oral Daily   metoprolol succinate  25 mg Oral Daily   mexiletine  150 mg Oral Q12H   polyethylene glycol  17 g Oral Daily   senna-docusate  1 tablet Oral BID   sodium  chloride flush  3 mL Intravenous Q12H   spironolactone  25 mg Oral Daily   tamsulosin  0.4 mg Oral QPC supper   Continuous Infusions:   LOS: 12 days    Time spent:    Zannie Cove, MD [facility]   07/01/2023, 10:13 AM

## 2023-07-01 NOTE — Progress Notes (Signed)
 Mobility Specialist Progress Note:    07/01/23 1159  Mobility  Activity Ambulated with assistance in hallway  Level of Assistance Standby assist, set-up cues, supervision of patient - no hands on  Assistive Device None  Distance Ambulated (ft) 100 ft  Activity Response Tolerated well  Mobility Referral Yes  Mobility visit 1 Mobility  Mobility Specialist Start Time (ACUTE ONLY) 1117  Mobility Specialist Stop Time (ACUTE ONLY) 1129  Mobility Specialist Time Calculation (min) (ACUTE ONLY) 12 min   Received pt in bed having no complaints and agreeable to mobility. Pt was asymptomatic throughout session. Ambulated on RA SPO2 stayed between 89%-92%. Returned to room w/o fault. Left in bed w/ call bell in reach and all needs met.   Thompson Grayer Mobility Specialist  Please contact vis Secure Chat or  Rehab Office [phone]

## 2023-07-02 DIAGNOSIS — J9601 Acute respiratory failure with hypoxia: Secondary | ICD-10-CM | POA: Diagnosis not present

## 2023-07-02 MED ORDER — SENNOSIDES-DOCUSATE SODIUM 8.6-50 MG PO TABS: 1.0000 | ORAL_TABLET | Freq: Two times a day (BID) | ORAL | 0 refills | Status: AC

## 2023-07-02 MED ORDER — ATORVASTATIN CALCIUM 20 MG PO TABS: 20.0000 mg | ORAL_TABLET | Freq: Every day | ORAL | 0 refills | Status: DC

## 2023-07-02 MED ORDER — TRAMADOL HCL 50 MG PO TABS
50.0000 mg | ORAL_TABLET | Freq: Four times a day (QID) | ORAL | 0 refills | Status: AC | PRN
Start: 2023-07-02 — End: ?

## 2023-07-02 MED ORDER — MEXILETINE HCL 150 MG PO CAPS: 150.0000 mg | ORAL_CAPSULE | Freq: Two times a day (BID) | ORAL | 0 refills | Status: DC

## 2023-07-02 MED ORDER — FUROSEMIDE 40 MG PO TABS: 40.0000 mg | ORAL_TABLET | Freq: Every day | ORAL | 0 refills | Status: DC

## 2023-07-02 MED ORDER — METOPROLOL SUCCINATE ER 25 MG PO TB24: 25.0000 mg | ORAL_TABLET | Freq: Every day | ORAL | 0 refills | Status: DC

## 2023-07-02 MED ORDER — SPIRONOLACTONE 25 MG PO TABS: 25.0000 mg | ORAL_TABLET | Freq: Every day | ORAL | 0 refills | Status: DC

## 2023-07-02 NOTE — Discharge Summary (Incomplete)
 Physician Discharge Summary  [name] [name] RUE:[id number] DOB: 07-19-1950 DOA: 06/18/2023  PCP: [name] Salisbury, MD  Admit date: 06/18/2023 Discharge date: 07/03/2023  Time spent: 45 minutes  Recommendations for Outpatient Follow-up:  [facility] TOC clinic on 4/8   Discharge Diagnoses:  Principal Problem:   Acute respiratory failure with hypoxia (HCC) Active Problems:   Hyperlipemia, mixed   Depression, major, single episode, moderate (HCC)   Essential hypertension   COPD (chronic obstructive pulmonary disease) with chronic bronchitis (HCC)   Coronary artery disease involving native coronary artery of native heart with angina pectoris (HCC)   Chronic kidney disease, stage 3a (HCC)   Acute on chronic heart failure with preserved ejection fraction (HFpEF) (HCC)   Discharge Condition: Stable Diet recommendation: Low-salt, heart healthy  Filed Weights   06/30/23 0435 07/01/23 0354 07/02/23 0504  Weight: 86.8 kg 84.9 kg 84.1 kg    History of present illness:  73/F w/ CAD, 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  CTA chest ruled out PE.  BNP was elevated.  Patient was admitted for the management of acute on chronic HFpEF.    Hospital Course:  Acute hypoxic respiratory failure secondary to CHF exacerbation:    -Hypoxic on arrival, required BiPAP on admission -Improved, weaned off O2 -Medically stable for discharge   Acute on chronic HFpEF/pulmonary hypertension: -Echo noted preserved EF, grade 1 DD, pulmonary hypertension -followed by cardiology, LHC noted diffuse disease with no targets for PCI, medical management recommended  -Diuresed with IV Lasix, volume status has improved, now on oral Lasix and Aldactone, poor SGLT2i candidate -w/ h/o Urinary Retention -Follow-up with [facility], remains stable   COPD:  -Continue bronchodilators as needed   Coronary artery disease/elevated troponin -Troponins mildly elevated.  Echo did not show any wall motion abnormality. cardiac cath which showed diffuse coronary disease but no target for PCI.  Current plan is for medical management. -Continue aspirin, Toprol and atorvastatin   Pulmonary nodules: Seen on CT.  Follow-up recommended with CT chest in a year   History of frequent symptomatic PVCs: On flecainide, Toprol at home.  Flecainide stopped.  Continue metoprolol, mexiletine   History of hypertension: Hospital course remarkable for hypotension. -Antihypertensives held briefly, now Toprol resumed, BP stable   AKI on CKD stage IIIb: Baseline creatinine around 1.1-1.5.  Currently kidney function at baseline.   Hyperlipidemia: Currently not on statin   Depression/anxiety: On Abilify   Debility/deconditioning, history of falls, -Seen by PT OT, SNF recommended initially, difficult placement, TOC following, mobility improving      DVT prophylaxis: Lovenox    Code Status: DNR  Discharge Exam: Vitals:   07/02/23 0504 07/02/23 0730  BP: 114/72 120/71  Pulse: 83 100  Resp: 19 20  Temp: 97.6 F (36.4 C) 97.7 F (36.5 C)  SpO2: 96% 91%   Gen: Awake, Alert, Oriented X 3,  HEENT: no JVD Lungs: Good air movement bilaterally, CTAB CVS: S1S2/RRR Abd: soft, Non tender, non distended, BS present Extremities: No edema Skin: no new rashes on exposed skin     Discharge Instructions    Allergies as of 07/02/2023       Reactions   Augmentin [amoxicillin-pot Clavulanate] Nausea And Vomiting   Clindamycin/lincomycin Nausea And Vomiting   Colchicine Diarrhea   Flecainide Other (See Comments)   Conjunctivitis   Hydroxychloroquine Rash  Medication List     STOP taking these medications    flecainide 50 MG tablet Commonly known as: TAMBOCOR   isosorbide mononitrate 30 MG 24 hr tablet Commonly known as: IMDUR   lisinopril 10 MG  tablet Commonly known as: ZESTRIL   temazepam 15 MG capsule Commonly known as: RESTORIL       TAKE these medications    albuterol 108 (90 Base) MCG/ACT inhaler Commonly known as: ProAir HFA Inhale 2 puffs into the lungs every 6 (six) hours as needed for wheezing.   ALPRAZolam 0.25 MG tablet Commonly known as: XANAX Take 1 tablet (0.25 mg total) by mouth 3 (three) times daily as needed for anxiety. TAKE 3 TABLETS BY MOUTH EVERY 6 HOURS AS NEEDED What changed: See the new instructions.   amLODipine 5 MG tablet Commonly known as: NORVASC Take 1 tablet (5 mg total) by mouth daily.   ARIPiprazole 2 MG tablet Commonly known as: ABILIFY Take 2 mg by mouth daily.   aspirin EC 81 MG tablet Take 1 tablet (81 mg total) by mouth daily.   atorvastatin 20 MG tablet Commonly known as: LIPITOR Take 1 tablet (20 mg total) by mouth daily.   cetirizine 10 MG tablet Commonly known as: ZYRTEC Take 10 mg by mouth daily as needed for allergies.   cholecalciferol 25 MCG (1000 UNIT) tablet Commonly known as: VITAMIN D3 Take 1,000 Units by mouth daily.   furosemide 40 MG tablet Commonly known as: LASIX Take 1 tablet (40 mg total) by mouth daily.   magnesium oxide 400 (240 Mg) MG tablet Commonly known as: MAG-OX TAKE 1 TABLET BY MOUTH EVERY DAY   metoprolol succinate 25 MG 24 hr tablet Commonly known as: TOPROL-XL Take 1 tablet (25 mg total) by mouth daily. TAKE WITH OR IMMEDIATELY FOLLOWING A MEAL. What changed:  medication strength how much to take   mexiletine 150 MG capsule Commonly known as: MEXITIL Take 1 capsule (150 mg total) by mouth every 12 (twelve) hours.   nitroGLYCERIN 0.4 MG SL tablet Commonly known as: NITROSTAT Place 1 tablet (0.4 mg total) under the tongue every 5 (five) minutes as needed for chest pain.   senna-docusate 8.6-50 MG tablet Commonly known as: Senokot-S Take 1 tablet by mouth 2 (two) times daily.   spironolactone 25 MG tablet Commonly known  as: ALDACTONE Take 1 tablet (25 mg total) by mouth daily.   traMADol 50 MG tablet Commonly known as: ULTRAM Take 1 tablet (50 mg total) by mouth every 6 (six) hours as needed for moderate pain (pain score 4-6).       Allergies  Allergen Reactions   Augmentin [Amoxicillin-Pot Clavulanate] Nausea And Vomiting   Clindamycin/Lincomycin Nausea And Vomiting   Colchicine Diarrhea   Flecainide Other (See Comments)    Conjunctivitis    Hydroxychloroquine Rash    Contact information for follow-up providers     Care, [facility] Follow up.   Specialty: Home Health Services Why: Agency will call you to set up apt times Contact information: [address] [place] [address] [phone]         [facility]. Go in 7 day(s).   Specialty: Cardiology Why: Hospiotal follow up 07/10/2023 @ 11 am PLEASE bring a current medication list to appointment FREE valet parking, Entrance C, off [employer] information: [address] [place] [address] [phone]             Contact  information for after-discharge care     Destination     [facility] Preferred SNF .   Service: Skilled Nursing Contact information: [address] [place] [address] [phone]                      The results of significant diagnostics from this hospitalization (including imaging, microbiology, ancillary and laboratory) are listed below for reference.    Significant Diagnostic Studies: CARDIAC CATHETERIZATION Result Date: 06/22/2023 1.  Patent left main with no significant stenosis 2.  Patent LAD with diffuse calcification and moderate mid vessel stenosis of 50 to 60% 3.  Patent circumflex with no significant stenosis 4.  Large, dominant RCA with diffuse calcification but no significant stenosis 5.  Normal LVEDP 6.  Moderate pulmonary hypertension with PA pressure  65/33 mean 45 mmHg, transpulmonary gradient 25 mmHg, PVR 6 Wood units 7.  Preserved cardiac output of 4 L/min with a cardiac index of 2.2 Recommend: medical therapy, no indication for PCI, further evaluation/treatment of pulmonary HTN per inpatient team   ECHOCARDIOGRAM COMPLETE Result Date: 06/19/2023    ECHOCARDIOGRAM REPORT   Patient Name:   VERBA AINLEY Date of Exam: 06/19/2023 Medical Rec #:  [id number]    Height:       60.0 in Accession #:    [id number]   Weight:       82.2 lb Date of Birth:  [birth year]    BSA:          1.279 m Patient Age:    72 years     BP:           104/91 mmHg Patient Gender: F            HR:           95 bpm. Exam Location:  Inpatient Procedure: 2D Echo, Cardiac Doppler and Color Doppler (Both Spectral and Color            Flow Doppler were utilized during procedure). Indications:    Dyspnea  History:        Patient has prior history of Echocardiogram examinations, most                 recent 02/14/2019. CAD, COPD; Risk Factors:Hypertension,                 Diabetes and Dyslipidemia.  Sonographer:    Meagan Baucom RDCS, FE, PE Referring Phys: Darreld Mclean MD IMPRESSIONS  1. Left ventricular ejection fraction, by estimation, is 60 to 65%. The left ventricle has normal function. The left ventricle has no regional wall motion abnormalities. Left ventricular diastolic parameters are consistent with Grade I diastolic dysfunction (impaired relaxation). There is the interventricular septum is flattened in systole and diastole, consistent with right ventricular pressure and volume overload.  2. Right ventricular systolic function is mildly reduced. The right ventricular size is normal.  3. The mitral valve is normal in structure. No evidence of mitral valve regurgitation. No evidence of mitral stenosis.  4. The aortic valve is tricuspid. Aortic valve regurgitation is trivial. Aortic valve sclerosis is present, with no evidence of aortic valve stenosis.  5. The inferior vena cava is normal in  size with greater than 50% respiratory variability, suggesting right atrial pressure of 3 mmHg. FINDINGS  Left Ventricle: Left ventricular ejection fraction, by estimation, is 60 to 65%. The left ventricle has normal function. The left ventricle has no regional wall motion abnormalities. The left ventricular internal cavity  size was normal in size. There is  no left ventricular hypertrophy. The interventricular septum is flattened in systole and diastole, consistent with right ventricular pressure and volume overload. Left ventricular diastolic parameters are consistent with Grade I diastolic dysfunction (impaired relaxation). Right Ventricle: The right ventricular size is normal. Right ventricular systolic function is mildly reduced. Left Atrium: Left atrial size was normal in size. Right Atrium: Right atrial size was normal in size. Pericardium: Trivial pericardial effusion is present. Mitral Valve: The mitral valve is normal in structure. No evidence of mitral valve regurgitation. No evidence of mitral valve stenosis. Tricuspid Valve: The tricuspid valve is normal in structure. Tricuspid valve regurgitation is trivial. No evidence of tricuspid stenosis. Aortic Valve: The aortic valve is tricuspid. Aortic valve regurgitation is trivial. Aortic valve sclerosis is present, with no evidence of aortic valve stenosis. Aortic valve mean gradient measures 7.0 mmHg. Aortic valve peak gradient measures 12.7 mmHg.  Aortic valve area, by VTI measures 2.58 cm. Pulmonic Valve: The pulmonic valve was normal in structure. Pulmonic valve regurgitation is not visualized. No evidence of pulmonic stenosis. Aorta: The aortic root is normal in size and structure. Venous: The inferior vena cava is normal in size with greater than 50% respiratory variability, suggesting right atrial pressure of 3 mmHg. IAS/Shunts: No atrial level shunt detected by color flow Doppler.  LEFT VENTRICLE PLAX 2D LVIDd:         4.70 cm   Diastology LVIDs:          3.00 cm   LV e' medial:    5.66 cm/s LV PW:         1.10 cm   LV E/e' medial:  12.9 LV IVS:        1.10 cm   LV e' lateral:   11.10 cm/s LVOT diam:     2.20 cm   LV E/e' lateral: 6.6 LV SV:         74 LV SV Index:   58 LVOT Area:     3.80 cm  RIGHT VENTRICLE RV Basal diam:  2.30 cm RV Mid diam:    1.90 cm RV S prime:     12.60 cm/s TAPSE (M-mode): 1.2 cm LEFT ATRIUM             Index        RIGHT ATRIUM           Index LA diam:        3.20 cm 2.50 cm/m   RA Area:     13.50 cm LA Vol (A2C):   35.3 ml 27.59 ml/m  RA Volume:   27.70 ml  21.65 ml/m LA Vol (A4C):   36.7 ml 28.69 ml/m LA Biplane Vol: 37.7 ml 29.47 ml/m  AORTIC VALVE AV Area (Vmax):    2.65 cm AV Area (Vmean):   2.49 cm AV Area (VTI):     2.58 cm AV Vmax:           178.00 cm/s AV Vmean:          116.000 cm/s AV VTI:            0.286 m AV Peak Grad:      12.7 mmHg AV Mean Grad:      7.0 mmHg LVOT Vmax:         124.00 cm/s LVOT Vmean:        76.000 cm/s LVOT VTI:          0.194 m LVOT/AV VTI ratio:  0.68  AORTA Ao Root diam: 3.10 cm Ao Asc diam:  3.50 cm MITRAL VALVE MV Area (PHT): 4.63 cm    SHUNTS MV Decel Time: 164 msec    Systemic VTI:  0.19 m MV E velocity: 72.80 cm/s  Systemic Diam: 2.20 cm MV A velocity: 95.90 cm/s MV E/A ratio:  0.76 Olga Millers MD Electronically signed by Olga Millers MD Signature Date/Time: 06/19/2023/11:48:10 AM    Final    CT Angio Chest PE W and/or Wo Contrast Result Date: 06/18/2023 CLINICAL DATA:  Pulmonary embolism suspected, high probability. Shortness of breath. EXAM: CT ANGIOGRAPHY CHEST WITH CONTRAST TECHNIQUE: Multidetector CT imaging of the chest was performed using the standard protocol during bolus administration of intravenous contrast. Multiplanar CT image reconstructions and MIPs were obtained to evaluate the vascular anatomy. RADIATION DOSE REDUCTION: This exam was performed according to the departmental dose-optimization program which includes automated exposure control, adjustment of the mA  and/or kV according to patient size and/or use of iterative reconstruction technique. CONTRAST:  75mL OMNIPAQUE IOHEXOL 350 MG/ML SOLN COMPARISON:  09/11/2021, 04/13/2021. FINDINGS: Cardiovascular: The heart is enlarged and there is no pericardial effusion. Multi-vessel coronary artery calcifications are noted. 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 The pulmonary trunk is mildly distended which may be associated with underlying pulmonary artery hypertension. No pulmonary embolism is seen. Evaluation of the pulmonary arteries at the lung bases is limited due to respiratory motion artifact. Mediastinum/Nodes: No enlarged mediastinal, hilar, or axillary lymph nodes. Thyroid gland, trachea, and esophagus demonstrate no significant findings. Lungs/Pleura: Scattered atelectasis or scarring is noted bilaterally. There are small bilateral pleural effusions with dependent atelectasis in the lower lobes. A nodular opacity is noted in the right middle lobe measuring 1.1 cm, versus 1.0 cm on the previous exam. There is a 7 mm nodule in the left lower lobe, versus 6 mm on the previous exam, axial image 72. No new nodule is seen. No pneumothorax. Upper Abdomen: There is thickening of the left adrenal gland without evidence of discrete nodule. An exophytic hypodensity is noted in the mid right kidney, previously characterized as cyst. No acute abnormality. Musculoskeletal: Degenerative changes are present in the thoracic spine. No acute osseous abnormality. Review of the MIP images confirms the above findings. IMPRESSION: 1. No evidence of pulmonary embolism. 2. Scattered atelectasis or scarring bilaterally. 3. Small bilateral pleural effusions. 4. Scattered pulmonary nodules bilaterally measuring up to 1.1 cm, not significantly changed from 2022. Given stability over time, 1 year follow-up chest CT is recommended for high-risk patients. 5.  Multi-vessel coronary artery calcifications. 6. 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 Recommend annual imaging followup by CTA or MRA. This recommendation follows 2010 ACCF/AHA/AATS/ACR/ASA/SCA/SCAI/SIR/STS/SVM Guidelines for the Diagnosis and Management of Patients with Thoracic Aortic Disease. Circulation. 2010; 121: W098-J191. Aortic aneurysm NOS (ICD10-I71.9) Electronically Signed   By: Thornell Sartorius M.D.   On: 06/18/2023 22:25   DG Chest Portable 1 View Result Date: 06/18/2023 CLINICAL DATA:  Shortness of breath EXAM: PORTABLE CHEST 1 VIEW COMPARISON:  05/14/2015 FINDINGS: Heart and mediastinal contours within normal limits. Aortic atherosclerosis. Bilateral lower lobe airspace opacities. No visible effusions. No acute bony abnormality. IMPRESSION: Bilateral lower lobe airspace opacities could reflect atelectasis or pneumonia. Electronically Signed   By: Charlett Nose M.D.   On: 06/18/2023 17:15    Microbiology: No results found for this or any previous visit (from the past 240 hours).  Labs: Basic Metabolic Panel: Recent Labs  Lab 06/26/23 0303 06/27/23 0315 06/29/23 0301  NA 140 138 139  K 4.1 4.1 4.1  CL 100 97* 101  CO2 31 31 31   GLUCOSE 98 98 109*  BUN 22 25* 24*  CREATININE 1.06* 1.16* 1.18*  CALCIUM 8.6* 8.9 8.6*  MG 1.6* 2.2  --    Liver Function Tests: No results for input(s): "AST", "ALT", "ALKPHOS", "BILITOT", "PROT", "ALBUMIN" in the last 168 hours. No results for input(s): "LIPASE", "AMYLASE" in the last 168 hours. No results for input(s): "AMMONIA" in the last 168 hours. CBC: No results for input(s): "WBC", "NEUTROABS", "HGB", "HCT", "MCV", "PLT" in the last 168 hours. Cardiac Enzymes: No results for input(s): "CKTOTAL", "CKMB", "CKMBINDEX", "TROPONINI" in the last 168 hours. BNP: BNP (last 3 results) Recent Labs    06/18/23 1648  BNP 715.9*    ProBNP (last 3 results) No results for input(s): "PROBNP"  in the last 8760 hours.  CBG: No results for input(s): "GLUCAP" in the last 168 hours.     Signed:  Zannie Cove MD.  [facility] 07/02/2023, 11:41 AM

## 2023-07-02 NOTE — Progress Notes (Signed)
 Heart Failure Stewardship Pharmacist Progress Note   PCP: [name], MD PCP-Cardiologist: Verne Carrow, MD    HPI:  73 y.o. female with PMHx of RA, GERD, allergic rhinitis, osteoarthritis, HLD, HTN, COPD, CAD, CKD (stage 3a), and depression/anxiety.  She presented to ED on 03/17 with complaints of worsening SOB. Patient reported she is typically SOB and has a non-productive cough at baseline due to COPD, but 3 days ago she noticed DOE with minimal activity. She also reported lower extremity swelling.  Initial vitals were 152/93, HR 84, RR 32. She was given 6 L O2 via [place] and later transitioned to BiPAP. BNP 715.9. She was given Lasix IV 40 mg prior to admission. CXR on 03/17 showed bilateral lower lobe airspace opacities could reflect atelectasis or pneumonia. Chest CT on  03/17 revealed no evidence of pulmonary embolism, scattered atelectasis or scarring bilaterally, small bilateral pleural effusions, scattered pulmonary nodules not significantly changed from 2022, multi-vessel coronary artery calcifications, aortic arthrosclerosis with aneurysmal dilation of the ascending aorta. She does follow cardiology for CAD. Her SO recently passed ~2 weeks ago. ECHO on 03/18 revealed LVEF of 60-65% (unchanged from 2020), LV has normal function, no regional wall abnormalities, LV G1 DD, signs of RV pressure and volume overload, MV is normal in structure, and right atrial pressure of 3 mmHg.   R/LHC on 03/21 revealed moderate pulmonary HTN (PA pressure 45 mmHg), CO 4 L/min, CI 2.2, RA 13, and wedge 20 mmHg. Patent left main with no significant stenosis, patent LAD with moderate mid vessel stenosis, and patent circumflex. Plan medical management.   Patient was transitioned off IV lasix on 03/19. Lisinopril 10 mg and Amlodipine 5 mg discontinued on 03/19.   No overnight events noted. Planning discharge to SNF pending appeal.  Current HF Medications: Diuretic: furosemide 40 mg PO daily Beta  Blocker: metoprolol succinate 25 mg daily MRA: spironolactone 25 mg daily  Prior to admission HF Medications: Beta blocker: Metoprolol succinate 50 mg every day (last dispensed 05/2023 for 90 ds) ACE/ARB/ARNI: Lisinopril 10 mg every day (last dispensed 05/2023 for 90 ds) Other: Amlodipine 5 mg every day (last dispensed 06/2023 for 90 ds) and Imdur 30 mg every day (patient reported not taking; last dispensed 06/2023 for 90 ds)  Pertinent Lab Values (as of 03/28) Serum creatinine: 1.18, BUN 24, Potassium 4.1, Sodium 139, BNP 715.9, Magnesium 2.2, A1c 5.8 (05/2023)  Vital Signs: Weight: 185 lbs (admission weight: 190.9 lbs<- estimate bed wt Blood pressure: 110/70s Heart rate: 80s I/O: net -1.2 L yesterday; net -6.8 L since admission  Medication Assistance / Insurance Benefits Check: Does the patient have prescription insurance?  Yes Type of insurance plan: UHC Medicare  Does the patient qualify for medication assistance through manufacturers or grants?   Yes  Outpatient Pharmacy:  Prior to admission outpatient pharmacy:  [id number] - [place], [place] - [address]   Is the patient willing to use [facility] TOC pharmacy at discharge? Yes Is the patient willing to transition their outpatient pharmacy to utilize a [facility] outpatient pharmacy?   No    Assessment: 1. Acute on chronic HFpEF (LVEF 60-65%). NYHA class I symptoms. - Continue furosemide 40 mg daily. Strict I/Os and daily weights. Keep K>4, Mg>2 - Continue metoprolol succinate at 25 mg daily - Continue spironolactone 25 mg daily - Due to persistent lightheadedness/dizziness consider discontinuing Lisinopril at discharge - Poor candidate for SGLT2i d/t urinary retention  Plan: 1) Medication changes recommended at this time: - None -  medically safe for discharge. Awaiting SNF appeal. Will sign off.  - Stop lisinopril and flecainide on discharge  2) Patient assistance: - She is agreeable to using [facility] TOC pharmacy at  discharge  3)  Education  - Patient has been educated on current HF medications and potential additions to HF medication regimen - Patient verbalizes understanding that over the next few months, these medication doses may change and more medications may be added to optimize HF regimen - Patient has been educated on basic disease state pathophysiology and goals of therapy   Sharen Hones, PharmD, BCPS Heart Failure Stewardship Pharmacist Phone [phone]

## 2023-07-02 NOTE — Progress Notes (Signed)
 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  CTA chest ruled out PE.  BNP was elevated.  Patient was admitted for the management of acute on chronic HFpEF.  Cardiology was consulted and following.  Clinically improved now.  PT/OT recommending SNF.  Insurance declined SNF, appeal pending    Assessment & Plan:  Acute hypoxic respiratory failure secondary to CHF exacerbation:    -Hypoxic on arrival, required BiPAP on admission -Improved, down to 1 L now, attempt to wean off as tolerated -Medically stable for discharge, awaiting SNF appeal   Acute on chronic HFpEF/pulmonary hypertension: -Echo noted preserved EF, grade 1 DD, pulmonary hypertension -followed by cardiology, LHC noted diffuse disease with no targets for PCI, medical management recommended  -Diuresed with IV Lasix, volume status has improved, now on oral Lasix and Aldactone -Follow-up with [facility], remains stable   COPD: -Continue bronchodilators as needed   Coronary artery disease/elevated troponin -Troponins mildly elevated.  Echo did not show any wall motion abnormality. cardiac cath which showed diffuse coronary disease but no target for PCI.  Current plan is for medical management. -Continue aspirin, Toprol and atorvastatin   Pulmonary nodules: Seen on CT.  Follow-up recommended with CT chest in a year   History of frequent symptomatic PVCs: On flecainide, Toprol at home.  Flecainide stopped.  Continue metoprolol, mexiletine   History of hypertension: Hospital course remarkable for hypotension. -Antihypertensives held briefly, now Toprol resumed, BP stable   AKI on CKD stage IIIb: Baseline creatinine around  1.1-1.5.  Currently kidney function at baseline.   Hyperlipidemia: Currently not on statin   Depression/anxiety: On Abilify   Debility/deconditioning: Patient seen by PT/OT and recommended SNF on discharge.  TOC following      DVT prophylaxis: Lovenox    Code Status: DNR  Family Communication: None Consultants:   Procedures:  Subjective: No events overnight, waiting to hear from the social worker  Objective: Vitals:   06/28/23 0416 06/28/23 0417 06/28/23 0725 06/28/23 1028  BP: 122/72  133/81 131/73  Pulse: 78 78 88 84  Resp: 16 19 20 20   Temp: 98.6 F (37 C)  97.8 F (36.6 C) 97.9 F (36.6 C)  TempSrc: Oral  Oral Oral  SpO2: 92% 93% 94% 97%  Weight:      Height:        Intake/Output Summary (Last 24 hours) at 06/28/2023 1218 Last data filed at 06/28/2023 0925 Gross per 24 hour  Intake 363 ml  Output 201 ml  Net 162 ml   Filed Weights   06/26/23 0353 06/27/23 0413 06/28/23 0112  Weight: 88.7 kg 86.2 kg 87.1 kg    Examination:  Gen: Awake, Alert, Oriented X 3,  HEENT: no JVD Lungs: Good air movement bilaterally, CTAB CVS: S1S2/RRR Abd: soft, Non tender, non distended, BS present Extremities: No edema Skin: no new rashes on exposed skin   Data Reviewed:   CBC: No results for input(s): "WBC", "NEUTROABS", "HGB", "HCT", "MCV", "PLT" in the last 168 hours. Basic Metabolic Panel: Recent Labs  Lab 06/26/23 0303 06/27/23 0315 06/29/23 0301  NA 140 138 139  K 4.1 4.1 4.1  CL 100 97* 101  CO2 31 31 31   GLUCOSE 98 98 109*  BUN 22 25* 24*  CREATININE 1.06* 1.16* 1.18*  CALCIUM 8.6* 8.9 8.6*  MG 1.6* 2.2  --    GFR: Estimated Creatinine Clearance: 40.2 mL/min (A) (by C-G formula based on SCr of 1.18 mg/dL (H)). Liver Function Tests: No results for input(s): "AST", "ALT", "ALKPHOS", "BILITOT", "PROT", "ALBUMIN" in the last 168 hours. No results for input(s): "LIPASE", "AMYLASE" in the last 168 hours. No results for input(s): "AMMONIA" in the last 168  hours. Coagulation Profile: No results for input(s): "INR", "PROTIME" in the last 168 hours. Cardiac Enzymes: No results for input(s): "CKTOTAL", "CKMB", "CKMBINDEX", "TROPONINI" in the last 168 hours. BNP (last 3 results) No results for input(s): "PROBNP" in the last 8760 hours. HbA1C: No results for input(s): "HGBA1C" in the last 72 hours. CBG: No results for input(s): "GLUCAP" in the last 168 hours. Lipid Profile: No results for input(s): "CHOL", "HDL", "LDLCALC", "TRIG", "CHOLHDL", "LDLDIRECT" in the last 72 hours. Thyroid Function Tests: No results for input(s): "TSH", "T4TOTAL", "FREET4", "T3FREE", "THYROIDAB" in the last 72 hours. Anemia Panel: No results for input(s): "VITAMINB12", "FOLATE", "FERRITIN", "TIBC", "IRON", "RETICCTPCT" in the last 72 hours. Urine analysis:    Component Value Date/Time   COLORURINE YELLOW 05/14/2015 1432   APPEARANCEUR CLEAR 05/14/2015 1432   LABSPEC 1.021 05/14/2015 1432   PHURINE 6.5 05/14/2015 1432   GLUCOSEU NEGATIVE 05/14/2015 1432   HGBUR NEGATIVE 05/14/2015 1432   HGBUR negative 12/20/2009 0851   BILIRUBINUR n 01/07/2018 1434   KETONESUR NEGATIVE 05/14/2015 1432   PROTEINUR Negative 01/07/2018 1434   PROTEINUR NEGATIVE 05/14/2015 1432   UROBILINOGEN 0.2 01/07/2018 1434   UROBILINOGEN 0.2 10/07/2014 1632   NITRITE n 01/07/2018 1434   NITRITE NEGATIVE 05/14/2015 1432   LEUKOCYTESUR Negative 01/07/2018 1434   Sepsis Labs: @LABRCNTIP (procalcitonin:4,lacticidven:4)  )No results found for this or any previous visit (from the past 240 hours).   Radiology Studies: No results found.   Scheduled Meds:  ARIPiprazole  2 mg Oral Daily   aspirin EC  81 mg Oral Daily   atorvastatin  20 mg Oral Daily   Chlorhexidine Gluconate Cloth  6 each Topical Q0600   enoxaparin (LOVENOX) injection  40 mg Subcutaneous Q24H   furosemide  40 mg Oral Daily   metoprolol succinate  25 mg Oral Daily   mexiletine  150 mg Oral Q12H   polyethylene glycol   17 g Oral Daily   senna-docusate  1 tablet Oral BID   sodium chloride flush  3 mL Intravenous Q12H   spironolactone  25 mg Oral Daily   tamsulosin  0.4 mg Oral QPC supper   Continuous Infusions:   LOS: 13 days    Time spent:    Zannie Cove, MD [facility]   07/02/2023, 10:08 AM

## 2023-07-02 NOTE — Plan of Care (Signed)
  Problem: Health Behavior/Discharge Planning: Goal: Ability to manage health-related needs will improve Outcome: Progressing   Problem: Clinical Measurements: Goal: Ability to maintain clinical measurements within normal limits will improve Outcome: Progressing   Problem: Activity: Goal: Risk for activity intolerance will decrease Outcome: Progressing   Problem: Coping: Goal: Level of anxiety will decrease Outcome: Progressing   

## 2023-07-02 NOTE — Progress Notes (Signed)
 Occupational Therapy Treatment Patient Details Name: [name] [name] MRN: [id number] DOB: [birth year] Today's Date: 07/02/2023   History of present illness Pt is a 73 y.o. F admitted 06/18/23 with SOB and associated hypoxia. CTA chest neg for PE, had small bilat pleural effusions and small nodules. Pt was diuresed, found to be hypotensive, and received 500 cc of fluid. 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   OT comments  Pt seated EOB upon arrival. Ambulated touching furniture/objects intermittently to bathroom. Pt managed pericare after urinating. Stood at sink for one grooming activity. SpO2 of 93% on 1L O2. Provided another brochure for [facility] for home modifications as the first was misplaced. Pt's poor endurance limits her ability to prepare fresh meals, typically eats frozen prepared meals. She is aware they are sodium heavy. Talked about option of using slow cooker. Pt continues to demonstrate memory deficits. Patient will benefit from continued inpatient follow up therapy, <3 hours/day, HHOT if pt unable to go to post acute rehab.       If plan is discharge home, recommend the following:  A little help with walking and/or transfers;A little help with bathing/dressing/bathroom;Assistance with cooking/housework;Assist for transportation;Help with stairs or ramp for entrance   Equipment Recommendations  Other (comment) (recommend tub seat, pt unable to afford DME not covered by insurance)    Recommendations for Other Services      Precautions / Restrictions Precautions Precautions: Fall Recall of Precautions/Restrictions: Intact Restrictions Weight Bearing Restrictions Per Provider Order: No       Mobility Bed Mobility               General bed mobility comments: seated EOB at beginning and end of session    Transfers Overall transfer level: Needs assistance Equipment used: None Transfers: Sit to/from Stand Sit to Stand: Contact guard  assist           General transfer comment: from bed and BSC     Balance Overall balance assessment: Needs assistance   Sitting balance-Leahy Scale: Good       Standing balance-Leahy Scale: Poor Standing balance comment: reaches for sink, door knob, grab bar for external support                           ADL either performed or assessed with clinical judgement   ADL Overall ADL's : Needs assistance/impaired Eating/Feeding: Independent;Sitting   Grooming: Wash/dry hands;Contact guard assist;Standing                   Toilet Transfer: Contact guard assist;Ambulation;Regular Toilet;Grab bars   Toileting- Clothing Manipulation and Hygiene: Set up;Sitting/lateral lean       Functional mobility during ADLs: Contact guard assist      Patent attorney Communication Communication: Impaired Factors Affecting Communication: Hearing impaired   Cognition Arousal: Alert Behavior During Therapy: WFL for tasks assessed/performed Cognition: Cognition impaired     Awareness: Online awareness impaired Memory impairment (select all impairments): Short-term memory   Executive functioning impairment (select all impairments): Reasoning OT - Cognition Comments: Pt without recall of education in energy conservation or in resource give for [facility] for  stair lift.                 Following  commands: Intact        Cueing      Exercises      Shoulder Instructions       General Comments      Pertinent Vitals/ Pain       Pain Assessment Pain Assessment: No/denies pain  Home Living                                          Prior Functioning/Environment              Frequency  Min 2X/week        Progress Toward Goals  OT Goals(current goals can now be found in the care plan section)  Progress towards OT goals: Progressing  toward goals  Acute Rehab OT Goals OT Goal Formulation: With patient Time For Goal Achievement: 07/05/23 Potential to Achieve Goals: Good  Plan      Co-evaluation                 AM-PAC OT "6 Clicks" Daily Activity     Outcome Measure   Help from another person eating meals?: None Help from another person taking care of personal grooming?: A Little Help from another person toileting, which includes using toliet, bedpan, or urinal?: A Little Help from another person bathing (including washing, rinsing, drying)?: A Little Help from another person to put on and taking off regular upper body clothing?: A Little Help from another person to put on and taking off regular lower body clothing?: A Lot 6 Click Score: 18    End of Session Equipment Utilized During Treatment: Gait belt;Rolling walker (2 wheels);Oxygen (1L)  OT Visit Diagnosis: Muscle weakness (generalized) (M62.81);Other symptoms and signs involving cognitive function;Other (comment) (decreased activity tolerance)   Activity Tolerance Patient tolerated treatment well   Patient Left in bed;with call bell/phone within reach;with bed alarm set;with nursing/sitter in room   Nurse Communication          Time: 7425-9563 OT Time Calculation (min): 27 min  Charges: OT General Charges $OT Visit: 1 Visit OT Treatments $Self Care/Home Management : 23-37 mins  Berna Spare, OTR/L Acute Rehabilitation Services Office: [phone]   Evern Bio 07/02/2023, 1:36 PM

## 2023-07-02 NOTE — TOC Progression Note (Addendum)
 Transition of Care [facility]) - Progression Note    Patient Details  Name: [name] [name] MRN: [id number] Date of Birth: 10/20/1950  [facility] of [facility]) CM/SW Contact  Michaela Corner, [place] Phone Number: 07/02/2023, 12:06 PM  Clinical Narrative:   CSW left VM for Wynona Canes to discuss any update in insurance appeal for SNF.   12:30 PM Wynona Canes called CSW for update on appeal process. She has not heard back regarding the determination but need a representative form signed and faxed to insurance. Christine emailed CSW the form.  2:25 PM CSW faxed patient representative form and updated PT/OT notes for insurance appeal.  TOC will continue to follow.    Expected Discharge Plan: Skilled Nursing Facility Barriers to Discharge: Continued Medical Work up, SNF Pending bed offer, Insurance Authorization  Expected Discharge Plan and Services In-house Referral: Clinical Social Work     Living arrangements for the past 2 months: Single Family Home                                       Social Determinants of Health (SDOH) Interventions SDOH Screenings   Food Insecurity: No Food Insecurity (06/19/2023)  Housing: Low Risk  (06/26/2023)  Transportation Needs: No Transportation Needs (06/26/2023)  Utilities: Not At Risk (06/19/2023)  Alcohol Screen: Low Risk  (06/26/2023)  Depression (PHQ2-9): Low Risk  (05/14/2023)  Recent Concern: Depression (PHQ2-9) - High Risk (05/14/2023)  Financial Resource Strain: Low Risk  (06/26/2023)  Physical Activity: Inactive (05/14/2023)  Social Connections: Socially Isolated (06/19/2023)  Stress: No Stress Concern Present (05/14/2023)  Tobacco Use: Medium Risk (06/18/2023)  Health Literacy: Adequate Health Literacy (05/14/2023)    Readmission Risk Interventions     No data to display

## 2023-07-02 NOTE — Consult Note (Addendum)
 WOC Nurse Consult Note: Reason for Consult: requested to assess a DTPI on buttocks Wound type: Buttocks redness. Pressure Injury POA: No Measurement: both buttock bleachable when press. (See flowsheet) Wound bed: 100% redness on intact skin. Drainage (amount, consistency, odor) none Periwound: intact Dressing procedure/placement/frequency: Apply foam dressing to prevent further injury. Change position each 2 hours, the pt is able to change her position alone, with no help. Use Air loss mattress.  WOC team will not plan to follow further.  Please reconsult if further assistance is needed. Thank-you,  Denyse Amass BSN, RN, [employer], WOC  (Pager: [phone])

## 2023-07-02 NOTE — Plan of Care (Signed)
  Problem: Clinical Measurements: Goal: Ability to maintain clinical measurements within normal limits will improve Outcome: Progressing   Problem: Activity: Goal: Risk for activity intolerance will decrease Outcome: Progressing   Problem: Coping: Goal: Level of anxiety will decrease Outcome: Progressing   

## 2023-07-02 NOTE — Progress Notes (Signed)
 Physical Therapy Treatment Patient Details Name: [name] [name] MRN: [id number] DOB: 1950-07-31 Today's Date: 07/02/2023   History of Present Illness Pt is a 73 y.o. F admitted 06/18/23 with SOB and associated hypoxia. CTA chest neg for PE, had small bilat pleural effusions and small nodules. Pt was diuresed, found to be hypotensive, and received 500 cc of fluid. 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    PT Comments  Pt required CGA for OOB mobility for safety. She ambulated ~115ft using RW for increased stability. Pt engaged in balance training and demonstrated inability to maintain single leg stance or tandem stance without at least unilateral UE support. She maintained each position for <10 seconds indicating she is a high fall risk. Pt tolerated RA throughout today's session with SpO2 >88%. Patient will benefit from continued inpatient follow up therapy, <3 hours/day.      If plan is discharge home, recommend the following: A little help with walking and/or transfers;A little help with bathing/dressing/bathroom;Assistance with cooking/housework;Assist for transportation;Help with stairs or ramp for entrance   Can travel by private vehicle     Yes  Equipment Recommendations  Rolling walker (2 wheels)    Recommendations for Other Services       Precautions / Restrictions Precautions Precautions: Fall Recall of Precautions/Restrictions: Intact Restrictions Weight Bearing Restrictions Per Provider Order: No     Mobility  Bed Mobility Overal bed mobility: Needs Assistance Bed Mobility: Sit to Supine       Sit to supine: Supervision, HOB elevated   General bed mobility comments: Pt greeted seated EOB. Returned to supine, bringing BLE back into bed with HOB elevated and repositioned self in center of bed.    Transfers Overall transfer level: Needs assistance Equipment used: None Transfers: Sit to/from Stand, Bed to chair/wheelchair/BSC Sit to Stand:  Contact guard assist   Step pivot transfers: Contact guard assist       General transfer comment: Pt transferred from bed<>BSC with BUE support  and CGA for safety. Pt is dependent on PT to perform pericare following BM. Pt stood from lowest bed height with CGA to power up without an AD. Good eccentric control with sitting.    Ambulation/Gait Ambulation/Gait assistance: Contact guard assist Gait Distance (Feet): 100 Feet Assistive device: Rolling walker (2 wheels) Gait Pattern/deviations: Step-through pattern, Decreased stride length, Trunk flexed Gait velocity: reduced Gait velocity interpretation: <1.8 ft/sec, indicate of risk for recurrent falls   General Gait Details: Pt ambulated with short slow steps, limited foot clearence, and trunk flex over RW. VC to increase upright posture and bring body inside RW. Pt navigated obstacles in her room and the hallway well. She was unable to maintain a conversation during ambulation. Pt utilized PLB strategies. Pt benefitted from RW for increased stability and to reduce back pain.   Stairs             Wheelchair Mobility     Tilt Bed    Modified Rankin (Stroke Patients Only)       Balance Overall balance assessment: Needs assistance, History of Falls Sitting-balance support: No upper extremity supported, Feet supported Sitting balance-Leahy Scale: Good Sitting balance - Comments: Pt sat on EOB and BSC with supervision. She was able to reach outside her BOS without LOB.   Standing balance support: Bilateral upper extremity supported, During functional activity, Reliant on assistive device for balance Standing balance-Leahy Scale: Poor Standing balance comment: Pt is unsteady without an AD. She will reach for  external support at least unilaterally. Pt relied on RW for gait with CGA for safety. Single Leg Stance - Right Leg: 3 (Attempted 3 times. Pt was able to tolerate for above number of seconds with unilateral UE  support.) Single Leg Stance - Left Leg: 2 (Attempted 3 times. Pt was able to tolerate for above number of seconds with unilateral UE support.) Tandem Stance - Right Leg: 5 (Attempted 3 times. Pt was able to tolerate for above number of seconds with unilateral UE support.) Tandem Stance - Left Leg: 3 (Attempted 3 times. Pt was able to tolerate for above number of seconds with unilateral UE support.)                    Communication Communication Communication: Impaired Factors Affecting Communication: Hearing impaired  Cognition Arousal: Alert Behavior During Therapy: WFL for tasks assessed/performed   PT - Cognitive impairments: No apparent impairments                         Following commands: Intact      Cueing Cueing Techniques: Verbal cues, Gestural cues  Exercises General Exercises - Lower Extremity Long Arc Quad: Seated, Both, 10 reps, Strengthening (with 10 second hold) Mini-Sqauts: Standing, Both, 5 reps, Strengthening    General Comments General comments (skin integrity, edema, etc.): Pt greeted on 1L O2 via [place]. Titrated to RA and pt maintained SpO2 >89% throughout session. Pt was tachycardic with activity 105-125bpm.      Pertinent Vitals/Pain Pain Assessment Pain Assessment: Faces Faces Pain Scale: Hurts a little bit Pain Location: Back Pain Descriptors / Indicators: Aching, Discomfort, Sore Pain Intervention(s): Monitored during session, Repositioned    Home Living                          Prior Function            PT Goals (current goals can now be found in the care plan section) Acute Rehab PT Goals Patient Stated Goal: Get better and stop falling Progress towards PT goals: Progressing toward goals    Frequency    Min 2X/week      PT Plan      Co-evaluation              AM-PAC PT "6 Clicks" Mobility   Outcome Measure  Help needed turning from your back to your side while in a flat bed without using  bedrails?: A Little Help needed moving from lying on your back to sitting on the side of a flat bed without using bedrails?: A Little Help needed moving to and from a bed to a chair (including a wheelchair)?: A Little Help needed standing up from a chair using your arms (e.g., wheelchair or bedside chair)?: A Little Help needed to walk in hospital room?: A Little Help needed climbing 3-5 steps with a railing? : A Little 6 Click Score: 18    End of Session Equipment Utilized During Treatment: Gait belt Activity Tolerance: Patient tolerated treatment well Patient left: in bed;with call bell/phone within reach Nurse Communication: Mobility status;Other (comment) (Pt left on RA) PT Visit Diagnosis: Muscle weakness (generalized) (M62.81);History of falling (Z91.81);Repeated falls (R29.6);Unsteadiness on feet (R26.81);Difficulty in walking, not elsewhere classified (R26.2);Other abnormalities of gait and mobility (R26.89)     Time: 9528-4132 PT Time Calculation (min) (ACUTE ONLY): 29 min  Charges:    $Gait Training: 8-22 mins $Therapeutic Exercise: 8-22 mins PT  General Charges $$ ACUTE PT VISIT: 1 Visit                     Cheri Guppy, PT, DPT Acute Rehabilitation Services Office: [phone] Secure Chat Preferred  [name] 07/02/2023, 2:07 PM

## 2023-07-02 NOTE — Progress Notes (Signed)
 Mobility Specialist Progress Note:   07/02/23 1045  Mobility  Activity Ambulated with assistance in hallway  Level of Assistance Contact guard assist, steadying assist  Assistive Device None  Distance Ambulated (ft) 150 ft  Activity Response Tolerated well  Mobility Referral Yes  Mobility visit 1 Mobility  Mobility Specialist Start Time (ACUTE ONLY) 1045  Mobility Specialist Stop Time (ACUTE ONLY) 1100  Mobility Specialist Time Calculation (min) (ACUTE ONLY) 15 min   Pt agreeable to mobility session. Required only minG to ambulate with no AD use. SpO2 dropped to 87% on RA while ambulating, recovered with rest. Pt placed back on supplemental O2. Back in bed with all needs met.   Addison Lank Mobility Specialist Please contact via SecureChat or  Rehab office at [phone]

## 2023-07-03 ENCOUNTER — Encounter (HOSPITAL_COMMUNITY)

## 2023-07-03 DIAGNOSIS — J9601 Acute respiratory failure with hypoxia: Secondary | ICD-10-CM | POA: Diagnosis not present

## 2023-07-03 LAB — BASIC METABOLIC PANEL WITH GFR
Anion gap: 11 (ref 5–15)
BUN: 28 mg/dL — ABNORMAL HIGH (ref 8–23)
CO2: 26 mmol/L (ref 22–32)
Calcium: 8.8 mg/dL — ABNORMAL LOW (ref 8.9–10.3)
Chloride: 102 mmol/L (ref 98–111)
Creatinine, Ser: 1.39 mg/dL — ABNORMAL HIGH (ref 0.44–1.00)
GFR, Estimated: 40 mL/min — ABNORMAL LOW (ref >60)
Glucose, Bld: 102 mg/dL — ABNORMAL HIGH (ref 70–99)
Potassium: 3.7 mmol/L (ref 3.5–5.1)
Sodium: 139 mmol/L (ref 135–145)

## 2023-07-03 NOTE — Plan of Care (Signed)
  Problem: Health Behavior/Discharge Planning: Goal: Ability to manage health-related needs will improve Outcome: Progressing   Problem: Clinical Measurements: Goal: Ability to maintain clinical measurements within normal limits will improve Outcome: Progressing   Problem: Coping: Goal: Level of anxiety will decrease Outcome: Progressing   

## 2023-07-03 NOTE — TOC Progression Note (Signed)
 Transition of Care [facility]) - Progression Note    Patient Details  Name: [name] [name] MRN: [id number] Date of Birth: 02-06-1951  Transition of Care [facility]) CM/SW Contact  Michaela Corner, [place] Phone Number: 07/03/2023, 11:35 AM  Clinical Narrative:   CSW awaiting to hear back from [place] regarding insurance appeal for SNF.   TOC will continue to follow.    Expected Discharge Plan: Skilled Nursing Facility Barriers to Discharge: Continued Medical Work up, SNF Pending bed offer, Insurance Authorization  Expected Discharge Plan and Services In-house Referral: Clinical Social Work     Living arrangements for the past 2 months: Single Family Home                                       Social Determinants of Health (SDOH) Interventions SDOH Screenings   Food Insecurity: No Food Insecurity (06/19/2023)  Housing: Low Risk  (06/26/2023)  Transportation Needs: No Transportation Needs (06/26/2023)  Utilities: Not At Risk (06/19/2023)  Alcohol Screen: Low Risk  (06/26/2023)  Depression (PHQ2-9): Low Risk  (05/14/2023)  Recent Concern: Depression (PHQ2-9) - High Risk (05/14/2023)  Financial Resource Strain: Low Risk  (06/26/2023)  Physical Activity: Inactive (05/14/2023)  Social Connections: Socially Isolated (06/19/2023)  Stress: No Stress Concern Present (05/14/2023)  Tobacco Use: Medium Risk (06/18/2023)  Health Literacy: Adequate Health Literacy (05/14/2023)    Readmission Risk Interventions     No data to display

## 2023-07-03 NOTE — Progress Notes (Signed)
 Mobility Specialist Progress Note:   07/03/23 1045  Mobility  Activity Ambulated with assistance in hallway  Level of Assistance Contact guard assist, steadying assist  Assistive Device None  Distance Ambulated (ft) 200 ft  Activity Response Tolerated well  Mobility Referral Yes  Mobility visit 1 Mobility  Mobility Specialist Start Time (ACUTE ONLY) 1045  Mobility Specialist Stop Time (ACUTE ONLY) 1100  Mobility Specialist Time Calculation (min) (ACUTE ONLY) 15 min   Pt agreeable to mobility session. Required only minG assist to ambulate in hallway with no AD. VSS on RA throughout, SpO2 90-92%. Required short standing rest breaks d/t SOB. Pt left sitting EOB with all needs met.   Addison Lank Mobility Specialist Please contact via SecureChat or  Rehab office at [phone]

## 2023-07-03 NOTE — Plan of Care (Signed)
  Problem: Activity: Goal: Ability to return to baseline activity level will improve Outcome: Progressing   

## 2023-07-03 NOTE — Plan of Care (Signed)
  Problem: Clinical Measurements: Goal: Ability to maintain clinical measurements within normal limits will improve Outcome: Progressing   Problem: Activity: Goal: Risk for activity intolerance will decrease Outcome: Progressing   Problem: Coping: Goal: Level of anxiety will decrease Outcome: Progressing   Problem: Safety: Goal: Ability to remain free from injury will improve Outcome: Progressing   

## 2023-07-04 NOTE — TOC Progression Note (Addendum)
 Transition of Care [facility]) - Progression Note    Patient Details  Name: [name] [name] MRN: [id number] Date of Birth: 1950-06-30  Transition of Care [facility]) CM/SW Contact  Michaela Corner, [place] Phone Number: 07/04/2023, 11:18 AM  Clinical Narrative:   Per Wynona Canes, she has not heard back regarding fast appeal determination. CSW called insurance to receive an update regarding fast appeal. Per Insurance, a determination has not been reached yet. CSW resent representative form as insurance states they did not receive form sent 3/31. CSW uploaded form in [place] and faxed it to [phone].  TOC will continue to follow.    Expected Discharge Plan: Skilled Nursing Facility Barriers to Discharge: Continued Medical Work up, SNF Pending bed offer, Insurance Authorization  Expected Discharge Plan and Services In-house Referral: Clinical Social Work     Living arrangements for the past 2 months: Single Family Home                                       Social Determinants of Health (SDOH) Interventions SDOH Screenings   Food Insecurity: No Food Insecurity (06/19/2023)  Housing: Low Risk  (06/26/2023)  Transportation Needs: No Transportation Needs (06/26/2023)  Utilities: Not At Risk (06/19/2023)  Alcohol Screen: Low Risk  (06/26/2023)  Depression (PHQ2-9): Low Risk  (05/14/2023)  Recent Concern: Depression (PHQ2-9) - High Risk (05/14/2023)  Financial Resource Strain: Low Risk  (06/26/2023)  Physical Activity: Inactive (05/14/2023)  Social Connections: Socially Isolated (06/19/2023)  Stress: No Stress Concern Present (05/14/2023)  Tobacco Use: Medium Risk (06/18/2023)  Health Literacy: Adequate Health Literacy (05/14/2023)    Readmission Risk Interventions     No data to display

## 2023-07-04 NOTE — Progress Notes (Signed)
 Physical Therapy Treatment Patient Details Name: [name] [name] MRN: [id number] DOB: July 11, 1950 Today's Date: 07/04/2023   History of Present Illness Pt is a 73 y.o. F admitted 06/18/23 with SOB and associated hypoxia. CTA chest neg for PE, had small bilat pleural effusions and small nodules. Pt was diuresed, found to be hypotensive, and received 500 cc of fluid. 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    PT Comments  Pt in bed upon arrival and agreeable to PT session. Worked on dynamic balance, gait training, and stair negotiation in today's session. Pt is mildly unsteady when ambulating with no AD when navigating around obstacles and performing head turns. When pt loses her balance, she requires MinA to correct. Pt was able to ascend/descend 3 steps with 1 handrail and CGA for support. Pt fatigues easily and was unable to ascend/descend further steps due to pain and fatigue. Pt has a flight of steps to enter her home and does not have any physical assist available. Pt has had multiple prior falls and is at a high risk of future falls. Pt would benefit from <3hrs post acute rehab to prevent future falls and reduce risk of hospital re-admission. Pt is progressing towards goals. Acute PT to follow.      If plan is discharge home, recommend the following: A little help with walking and/or transfers;A little help with bathing/dressing/bathroom;Assistance with cooking/housework;Assist for transportation;Help with stairs or ramp for entrance   Can travel by private vehicle     Yes  Equipment Recommendations  Rolling walker (2 wheels)       Precautions / Restrictions Precautions Precautions: Fall Recall of Precautions/Restrictions: Intact Restrictions Weight Bearing Restrictions Per Provider Order: No     Mobility  Bed Mobility Overal bed mobility: Needs Assistance Bed Mobility: Sit to Supine, Supine to Sit    Supine to sit: Supervision, HOB elevated Sit to supine:  Supervision, HOB elevated   General bed mobility comments: supervision for safety with HOB elevated    Transfers Overall transfer level: Needs assistance Equipment used: None Transfers: Sit to/from Stand Sit to Stand: Contact guard assist    General transfer comment: CGA for safety    Ambulation/Gait Ambulation/Gait assistance: Min assist Gait Distance (Feet): 80 Feet (x80, x80) Assistive device: None Gait Pattern/deviations: Step-through pattern, Decreased stride length, Trunk flexed Gait velocity: reduced     General Gait Details: worked on Editor, commissioning with no AD. Pt with shortened steps and mildly unsteady with challenges to balance. MinA to correct slight unsteadiness   Stairs Stairs: Yes Stairs assistance: Contact guard assist Stair Management: One rail Left, Alternating pattern, Step to pattern, Forwards Number of Stairs: 3 General stair comments: increased effort with R LE leading for ascent, L LE leading for descent. Step to pattern to ascend and step through pattern to descend. CGA for stability.     Balance Overall balance assessment: Needs assistance, History of Falls Sitting-balance support: No upper extremity supported, Feet supported Sitting balance-Leahy Scale: Good     Standing balance support: Bilateral upper extremity supported, During functional activity, Reliant on assistive device for balance Standing balance-Leahy Scale: Poor Standing balance comment: reliant on external support for balance     Communication Communication Communication: Impaired Factors Affecting Communication: Hearing impaired  Cognition Arousal: Alert Behavior During Therapy: WFL for tasks assessed/performed   PT - Cognitive impairments: No apparent impairments    Following commands: Intact      Cueing Cueing Techniques: Verbal cues, Gestural cues  Exercises General Exercises - Lower Extremity Long Arc Quad: Seated, Both, Strengthening, 5 reps (isometric hold for  as long as possible (~15 sec)) Hip Flexion/Marching: AROM, Both, 10 reps, Seated        Pertinent Vitals/Pain Pain Assessment Pain Assessment: Faces Faces Pain Scale: Hurts little more Pain Location: low back Pain Descriptors / Indicators: Aching, Discomfort, Sore Pain Intervention(s): Limited activity within patient's tolerance, Monitored during session, Repositioned     PT Goals (current goals can now be found in the care plan section) Acute Rehab PT Goals Patient Stated Goal: Get better and stop falling PT Goal Formulation: With patient Time For Goal Achievement: 07/18/23 Potential to Achieve Goals: Good    Frequency    Min 2X/week       AM-PAC PT "6 Clicks" Mobility   Outcome Measure  Help needed turning from your back to your side while in a flat bed without using bedrails?: A Little Help needed moving from lying on your back to sitting on the side of a flat bed without using bedrails?: A Little Help needed moving to and from a bed to a chair (including a wheelchair)?: A Little Help needed standing up from a chair using your arms (e.g., wheelchair or bedside chair)?: A Little Help needed to walk in hospital room?: A Little Help needed climbing 3-5 steps with a railing? : A Little 6 Click Score: 18    End of Session Equipment Utilized During Treatment: Gait belt Activity Tolerance: Patient tolerated treatment well Patient left: in bed;with call bell/phone within reach Nurse Communication: Mobility status PT Visit Diagnosis: Muscle weakness (generalized) (M62.81);Repeated falls (R29.6);Unsteadiness on feet (R26.81);Difficulty in walking, not elsewhere classified (R26.2);Other abnormalities of gait and mobility (R26.89)     Time: 6213-0865 PT Time Calculation (min) (ACUTE ONLY): 25 min  Charges:    $Gait Training: 8-22 mins $Therapeutic Activity: 8-22 mins PT General Charges $$ ACUTE PT VISIT: 1 Visit                    Hilton Cork, PT, DPT Secure Chat  Preferred  Rehab Office [phone]   Arturo Morton Brion Aliment 07/04/2023, 3:28 PM

## 2023-07-04 NOTE — Progress Notes (Signed)
 Pt seen, no changes from my DC summary yesterday, she is awaiting appeal of insurance decline for SNF. ToC following  Zannie Cove

## 2023-07-04 NOTE — Care Management Important Message (Signed)
 Important Message  Patient Details  Name: [name] [name] MRN: [id number] Date of Birth: 1951-03-30   Important Message Given:  Yes - Medicare IM     Renie Ora 07/04/2023, 9:57 AM

## 2023-07-05 NOTE — TOC Progression Note (Signed)
 Transition of Care [facility]) - Progression Note    Patient Details  Name: [name] [name] MRN: [id number] Date of Birth: [birth year]  Transition of Care [facility]) CM/SW Contact  Leone Haven, RN Phone Number: 07/05/2023, 1:09 PM  Clinical Narrative:    NCM spoke with patient at bedside , she would like to have a hospital bed at home.  NCM offered choice, she does not have a preference for the agency.  NCM made referral to [facility] with [employer].  Patient's cell phone is [phone].    4/4- patient was approved for SNF , she informed CSW to let this NCM know to cancel the order for the hospital bed.  NCM contacted Jermaine with [employer] to cancel the order.    Expected Discharge Plan: Skilled Nursing Facility Barriers to Discharge: Continued Medical Work up, SNF Pending bed offer, Insurance Authorization  Expected Discharge Plan and Services In-house Referral: Clinical Social Work     Living arrangements for the past 2 months: Single Family Home                                       Social Determinants of Health (SDOH) Interventions SDOH Screenings   Food Insecurity: No Food Insecurity (06/19/2023)  Housing: Low Risk  (06/26/2023)  Transportation Needs: No Transportation Needs (06/26/2023)  Utilities: Not At Risk (06/19/2023)  Alcohol Screen: Low Risk  (06/26/2023)  Depression (PHQ2-9): Low Risk  (05/14/2023)  Recent Concern: Depression (PHQ2-9) - High Risk (05/14/2023)  Financial Resource Strain: Low Risk  (06/26/2023)  Physical Activity: Inactive (05/14/2023)  Social Connections: Socially Isolated (06/19/2023)  Stress: No Stress Concern Present (05/14/2023)  Tobacco Use: Medium Risk (06/18/2023)  Health Literacy: Adequate Health Literacy (05/14/2023)    Readmission Risk Interventions     No data to display

## 2023-07-05 NOTE — TOC Progression Note (Signed)
 CTransition [facility] Care [facility]) - Progression Note    Patient Details  Name: [name] [name] MRN: [id number] Date of Birth: Dec 18, 1950  Transition [facility]) CM/SW Contact  Michaela Corner, [place] Phone Number: 07/05/2023, 11:12 AM  Clinical Narrative:   CSW called insurance to check update on appeal status. Per [employer], the status of the appeal states "in progress". They informed CSW that the AOR form submitted 3/31 was not filled out correctly, as they told Wynona Canes the same information yesterday. Christine filled out the form correctly yesterday and CSW resubmitted form. Insurance stated that they accepted the form submitted yesterday. The deadline for insurance to make a decision on SNF appeal is 4/5. CSW notified MD and RNCM.  TOC will continue to follow.      Expected Discharge Plan: Skilled Nursing Facility Barriers to Discharge: Continued Medical Work up, SNF Pending bed offer, Insurance Authorization  Expected Discharge Plan and Services In-house Referral: Clinical Social Work     Living arrangements for the past 2 months: Single Family Home                                       Social Determinants of Health (SDOH) Interventions SDOH Screenings   Food Insecurity: No Food Insecurity (06/19/2023)  Housing: Low Risk  (06/26/2023)  Transportation Needs: No Transportation Needs (06/26/2023)  Utilities: Not At Risk (06/19/2023)  Alcohol Screen: Low Risk  (06/26/2023)  Depression (PHQ2-9): Low Risk  (05/14/2023)  Recent Concern: Depression (PHQ2-9) - High Risk (05/14/2023)  Financial Resource Strain: Low Risk  (06/26/2023)  Physical Activity: Inactive (05/14/2023)  Social Connections: Socially Isolated (06/19/2023)  Stress: No Stress Concern Present (05/14/2023)  Tobacco Use: Medium Risk (06/18/2023)  Health Literacy: Adequate Health Literacy (05/14/2023)    Readmission Risk Interventions     No data to display

## 2023-07-05 NOTE — Plan of Care (Signed)
   Problem: Health Behavior/Discharge Planning: Goal: Ability to manage health-related needs will improve Outcome: Progressing

## 2023-07-05 NOTE — Progress Notes (Signed)
 No changes, remains medically stable for discharge see discharge summary from 4/1, discussed with ToC this am regarding insurance appeal.  Hopefully  will get some answers today.  Zannie Cove, MD

## 2023-07-05 NOTE — Progress Notes (Signed)
 Occupational Therapy Treatment Patient Details Name: [name] [name] MRN: [id number] DOB: [birth year] Today's Date: 07/05/2023   History of present illness Pt is a 73 y.o. F admitted 06/18/23 with SOB and associated hypoxia. CTA chest neg for PE, had small bilat pleural effusions and small nodules. Pt was diuresed, found to be hypotensive, and received 500 cc of fluid. 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   OT comments  Pt with concerns about returning home due to stairs to get to pt's bedroom and full bathroom. Pt reports her living room is not conducive to pt being able to sleep on it with her chronic back pain and would only have access to a half bath. Pt stabilizing on furniture with ambulation to and from bathroom and sink. Pt with no identifiable assistance at home. Reinforced energy conservation strategies and importance of activity to increase endurance. Patient will benefit from continued inpatient follow up therapy, <3 hours/day with goal of increasing activity tolerance for stair management and safe return home.       If plan is discharge home, recommend the following:  A little help with walking and/or transfers;A little help with bathing/dressing/bathroom;Assistance with cooking/housework;Assist for transportation;Help with stairs or ramp for entrance   Equipment Recommendations  Tub/shower bench (pt is unable to pay for items not covered by insurance)    Recommendations for Other Services      Precautions / Restrictions Precautions Precautions: Fall Recall of Precautions/Restrictions: Intact       Mobility Bed Mobility Overal bed mobility: Independent                  Transfers Overall transfer level: Needs assistance Equipment used: None Transfers: Sit to/from Stand Sit to Stand: Supervision           General transfer comment: CGA for safety     Balance Overall balance assessment: Needs assistance, History of  Falls Sitting-balance support: No upper extremity supported, Feet supported Sitting balance-Leahy Scale: Good     Standing balance support: No upper extremity supported Standing balance-Leahy Scale: Poor Standing balance comment: touches sink, wall, grab bars                           ADL either performed or assessed with clinical judgement   ADL Overall ADL's : Needs assistance/impaired     Grooming: Wash/dry hands;Standing Grooming Details (indicate cue type and reason): offered to wash pt's hair over sink                 Toilet Transfer: Contact guard assist;Ambulation;Regular Toilet;Grab bars   Toileting- Clothing Manipulation and Hygiene: Set up;Sitting/lateral lean Toileting - Clothing Manipulation Details (indicate cue type and reason): pt has been relying on nursing staff for posterior pericare, she is able to reach with washcloths   Tub/Shower Transfer Details (indicate cue type and reason): educated in benefits of tub bench to avoid stepping over edge of tub Functional mobility during ADLs: Contact guard assist General ADL Comments: Pt with concerns about where she will sleep when she goes home as she cannot manage stairs.Report her couch is not conducive.    Extremity/Trunk Assessment              Diplomatic Services operational officer Communication Communication: Impaired Factors Affecting Communication: Hearing impaired   Cognition Arousal: Alert Behavior During Therapy: Flat affect Cognition:  Cognition impaired     Awareness: Online awareness impaired Memory impairment (select all impairments): Short-term memory   Executive functioning impairment (select all impairments): Reasoning, Problem solving                   Following commands: Intact Following commands impaired: Follows one step commands with increased time      Cueing   Cueing Techniques: Verbal cues  Exercises      Shoulder Instructions        General Comments      Pertinent Vitals/ Pain       Pain Assessment Pain Assessment: Faces Faces Pain Scale: Hurts little more Pain Location: low back Pain Descriptors / Indicators: Aching, Discomfort, Sore Pain Intervention(s): Monitored during session, Repositioned  Home Living                                          Prior Functioning/Environment              Frequency  Min 2X/week        Progress Toward Goals  OT Goals(current goals can now be found in the care plan section)  Progress towards OT goals: Progressing toward goals  Acute Rehab OT Goals OT Goal Formulation: With patient Time For Goal Achievement: 07/19/23 Potential to Achieve Goals: Good ADL Goals Pt Will Perform Grooming: with modified independence;standing Pt Will Perform Lower Body Bathing: with modified independence;sit to/from stand Pt Will Perform Lower Body Dressing: with modified independence;sit to/from stand Pt Will Perform Toileting - Clothing Manipulation and hygiene: with modified independence;sit to/from stand Additional ADL Goal #1: Pt will generalize energy conservation and breathing strategies in ADLs and mobility.  Plan      Co-evaluation                 AM-PAC OT "6 Clicks" Daily Activity     Outcome Measure   Help from another person eating meals?: None Help from another person taking care of personal grooming?: A Little Help from another person toileting, which includes using toliet, bedpan, or urinal?: A Little Help from another person bathing (including washing, rinsing, drying)?: A Little Help from another person to put on and taking off regular upper body clothing?: None Help from another person to put on and taking off regular lower body clothing?: A Little 6 Click Score: 20    End of Session Equipment Utilized During Treatment: Gait belt  OT Visit Diagnosis: Muscle weakness (generalized) (M62.81);History of falling (Z91.81);Other  symptoms and signs involving cognitive function;Pain;Unsteadiness on feet (R26.81)   Activity Tolerance Patient tolerated treatment well   Patient Left in bed;with call bell/phone within reach   Nurse Communication          Time: 6045-4098 OT Time Calculation (min): 25 min  Charges: OT General Charges $OT Visit: 1 Visit OT Treatments $Self Care/Home Management : 23-37 mins  Berna Spare, OTR/L Acute Rehabilitation Services Office: [phone]   Evern Bio 07/05/2023, 10:06 AM

## 2023-07-06 DIAGNOSIS — Z7401 Bed confinement status: Secondary | ICD-10-CM | POA: Diagnosis not present

## 2023-07-06 DIAGNOSIS — I25119 Atherosclerotic heart disease of native coronary artery with unspecified angina pectoris: Secondary | ICD-10-CM | POA: Diagnosis not present

## 2023-07-06 DIAGNOSIS — G894 Chronic pain syndrome: Secondary | ICD-10-CM | POA: Diagnosis not present

## 2023-07-06 DIAGNOSIS — M6259 Muscle wasting and atrophy, not elsewhere classified, multiple sites: Secondary | ICD-10-CM | POA: Diagnosis not present

## 2023-07-06 DIAGNOSIS — I493 Ventricular premature depolarization: Secondary | ICD-10-CM | POA: Diagnosis not present

## 2023-07-06 DIAGNOSIS — R531 Weakness: Secondary | ICD-10-CM | POA: Diagnosis not present

## 2023-07-06 DIAGNOSIS — M069 Rheumatoid arthritis, unspecified: Secondary | ICD-10-CM | POA: Diagnosis not present

## 2023-07-06 DIAGNOSIS — R0902 Hypoxemia: Secondary | ICD-10-CM | POA: Diagnosis not present

## 2023-07-06 DIAGNOSIS — J9601 Acute respiratory failure with hypoxia: Secondary | ICD-10-CM | POA: Diagnosis not present

## 2023-07-06 DIAGNOSIS — N1831 Chronic kidney disease, stage 3a: Secondary | ICD-10-CM | POA: Diagnosis not present

## 2023-07-06 DIAGNOSIS — M6281 Muscle weakness (generalized): Secondary | ICD-10-CM | POA: Diagnosis not present

## 2023-07-06 DIAGNOSIS — J4489 Other specified chronic obstructive pulmonary disease: Secondary | ICD-10-CM | POA: Diagnosis not present

## 2023-07-06 DIAGNOSIS — E782 Mixed hyperlipidemia: Secondary | ICD-10-CM | POA: Diagnosis not present

## 2023-07-06 DIAGNOSIS — I10 Essential (primary) hypertension: Secondary | ICD-10-CM | POA: Diagnosis not present

## 2023-07-06 DIAGNOSIS — G4700 Insomnia, unspecified: Secondary | ICD-10-CM | POA: Diagnosis not present

## 2023-07-06 DIAGNOSIS — I5033 Acute on chronic diastolic (congestive) heart failure: Secondary | ICD-10-CM | POA: Diagnosis not present

## 2023-07-06 NOTE — Discharge Summary (Signed)
 Physician Discharge Summary  [name] [name] BJY:[id number] DOB: May 20, 1950 DOA: 06/18/2023  PCP: [name], MD  Admit date: 06/18/2023 Discharge date: 07/03/2023  Time spent: 45 minutes  Recommendations for Outpatient Follow-up:  CHF TOC clinic on 4/8   Discharge Diagnoses:  Principal Problem:   Acute respiratory failure with hypoxia (HCC) Active Problems:   Hyperlipemia, mixed   Depression, major, single episode, moderate (HCC)   Essential hypertension   COPD (chronic obstructive pulmonary disease) with chronic bronchitis (HCC)   Coronary artery disease involving native coronary artery of native heart with angina pectoris (HCC)   Chronic kidney disease, stage 3a (HCC)   Acute on chronic heart failure with preserved ejection fraction (HFpEF) (HCC)   Discharge Condition: Stable Diet recommendation: Low-salt, heart healthy  Filed Weights   07/05/23 0537 07/06/23 0452 07/06/23 0505  Weight: 83.1 kg 83.7 kg 82.7 kg    History of present illness:  72/F w/ CAD, 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  CTA chest ruled out PE.  BNP was elevated.  Patient was admitted for the management of acute on chronic HFpEF.    Hospital Course:  Acute hypoxic respiratory failure secondary to CHF exacerbation:    -Hypoxic on arrival, required BiPAP on admission -Improved, weaned off O2 -hospitalization ended up being prolonged by over 1 week on account of SNF denial initially followed by approval a week later   Acute on chronic HFpEF/pulmonary hypertension: -Echo noted preserved EF, grade 1 DD, pulmonary hypertension -followed by cardiology, LHC noted diffuse disease with no targets for PCI, medical management recommended  -Diuresed with IV Lasix, volume status has improved, now on oral Lasix and Aldactone, poor SGLT2i  candidate -w/ h/o Urinary Retention -Follow-up with [facility], remains stable   COPD: -Continue bronchodilators as needed   Coronary artery disease/elevated troponin -Troponins mildly elevated.  Echo did not show any wall motion abnormality. cardiac cath which showed diffuse coronary disease but no target for PCI.  Current plan is for medical management. -Continue aspirin, Toprol and atorvastatin   Pulmonary nodules: Seen on CT.  Follow-up recommended with CT chest in a year   History of frequent symptomatic PVCs: On flecainide, Toprol at home.  Flecainide stopped.  Continue metoprolol, mexiletine   History of hypertension: Hospital course remarkable for hypotension. -Antihypertensives held briefly, now Toprol resumed, BP stable   AKI on CKD stage IIIb: Baseline creatinine around 1.1-1.5.  Currently kidney function at baseline.   Hyperlipidemia: Currently not on statin   Depression/anxiety: On Abilify   Debility/deconditioning, history of falls, -Seen by PT OT, SNF recommended initially, difficult placement, TOC following, mobility improving      DVT prophylaxis: Lovenox    Code Status: DNR  Discharge Exam: Vitals:   07/06/23 0454 07/06/23 0721  BP:  129/86  Pulse: (!) 46 (!) 105  Resp: (!) 24 20  Temp:  97.6 F (36.4 C)  SpO2: 90% 90%   Gen: Awake, Alert, Oriented X 3,  HEENT: no JVD Lungs: Good air movement bilaterally, CTAB CVS: S1S2/RRR Abd: soft, Non tender, non distended, BS present Extremities: No edema Skin: no new rashes on exposed skin     Discharge Instructions    Allergies as of 07/06/2023       Reactions   Augmentin [amoxicillin-pot Clavulanate] Nausea And Vomiting   Clindamycin/lincomycin Nausea And Vomiting   Colchicine Diarrhea  Flecainide Other (See Comments)   Conjunctivitis   Hydroxychloroquine Rash        Medication List     STOP taking these medications    flecainide 50 MG tablet Commonly known as: TAMBOCOR    isosorbide mononitrate 30 MG 24 hr tablet Commonly known as: IMDUR   lisinopril 10 MG tablet Commonly known as: ZESTRIL   temazepam 15 MG capsule Commonly known as: RESTORIL       TAKE these medications    albuterol 108 (90 Base) MCG/ACT inhaler Commonly known as: ProAir HFA Inhale 2 puffs into the lungs every 6 (six) hours as needed for wheezing.   ALPRAZolam 0.25 MG tablet Commonly known as: XANAX Take 1 tablet (0.25 mg total) by mouth 3 (three) times daily as needed for anxiety. TAKE 3 TABLETS BY MOUTH EVERY 6 HOURS AS NEEDED What changed: See the new instructions.   amLODipine 5 MG tablet Commonly known as: NORVASC Take 1 tablet (5 mg total) by mouth daily.   ARIPiprazole 2 MG tablet Commonly known as: ABILIFY Take 2 mg by mouth daily.   aspirin EC 81 MG tablet Take 1 tablet (81 mg total) by mouth daily.   atorvastatin 20 MG tablet Commonly known as: LIPITOR Take 1 tablet (20 mg total) by mouth daily.   cetirizine 10 MG tablet Commonly known as: ZYRTEC Take 10 mg by mouth daily as needed for allergies.   cholecalciferol 25 MCG (1000 UNIT) tablet Commonly known as: VITAMIN D3 Take 1,000 Units by mouth daily.   furosemide 40 MG tablet Commonly known as: LASIX Take 1 tablet (40 mg total) by mouth daily.   magnesium oxide 400 (240 Mg) MG tablet Commonly known as: MAG-OX TAKE 1 TABLET BY MOUTH EVERY DAY   metoprolol succinate 25 MG 24 hr tablet Commonly known as: TOPROL-XL Take 1 tablet (25 mg total) by mouth daily. TAKE WITH OR IMMEDIATELY FOLLOWING A MEAL. What changed:  medication strength how much to take   mexiletine 150 MG capsule Commonly known as: MEXITIL Take 1 capsule (150 mg total) by mouth every 12 (twelve) hours.   nitroGLYCERIN 0.4 MG SL tablet Commonly known as: NITROSTAT Place 1 tablet (0.4 mg total) under the tongue every 5 (five) minutes as needed for chest pain.   senna-docusate 8.6-50 MG tablet Commonly known as:  Senokot-S Take 1 tablet by mouth 2 (two) times daily.   spironolactone 25 MG tablet Commonly known as: ALDACTONE Take 1 tablet (25 mg total) by mouth daily.   traMADol 50 MG tablet Commonly known as: ULTRAM Take 1 tablet (50 mg total) by mouth every 6 (six) hours as needed for moderate pain (pain score 4-6).               Durable Medical Equipment  (From admission, onward)           Start     Ordered   07/05/23 1307  For home use only DME Hospital bed  Once       Comments: Therapeutic mattress  Question Answer Comment  Length of Need Lifetime   Patient has (list medical condition): CHF   The above medical condition requires: Patient requires the ability to reposition frequently   Head must be elevated greater than: 30 degrees   Bed type Semi-electric      07/05/23 1308           Allergies  Allergen Reactions   Augmentin [Amoxicillin-Pot Clavulanate] Nausea And Vomiting   Clindamycin/Lincomycin Nausea And Vomiting  Colchicine Diarrhea   Flecainide Other (See Comments)    Conjunctivitis    Hydroxychloroquine Rash    Contact information for follow-up providers     [facility] Follow up.   Specialty: Home Health Services Why: Agency will call you to set up apt times Contact information: [address] [place] [address] [phone]         [facility]. Go in 7 day(s).   Specialty: Cardiology Why: Hospiotal follow up 07/10/2023 @ 11 am PLEASE bring a current medication list to appointment FREE valet parking, Entrance C, off [employer] information: [address] [place] [address] [phone]        [facility] Follow up.   Why: hospital bed Contact information: [phone]             Contact information for after-discharge care     Destination     [facility] Preferred SNF .   Service: Skilled Nursing Contact  information: [address] [place] [address] [phone]                      The results of significant diagnostics from this hospitalization (including imaging, microbiology, ancillary and laboratory) are listed below for reference.    Significant Diagnostic Studies: CARDIAC CATHETERIZATION Result Date: 06/22/2023 1.  Patent left main with no significant stenosis 2.  Patent LAD with diffuse calcification and moderate mid vessel stenosis of 50 to 60% 3.  Patent circumflex with no significant stenosis 4.  Large, dominant RCA with diffuse calcification but no significant stenosis 5.  Normal LVEDP 6.  Moderate pulmonary hypertension with PA pressure 65/33 mean 45 mmHg, transpulmonary gradient 25 mmHg, PVR 6 Wood units 7.  Preserved cardiac output of 4 L/min with a cardiac index of 2.2 Recommend: medical therapy, no indication for PCI, further evaluation/treatment of pulmonary HTN per inpatient team   ECHOCARDIOGRAM COMPLETE Result Date: 06/19/2023    ECHOCARDIOGRAM REPORT   Patient Name:   [name] [name] Date of Exam: 06/19/2023 Medical Rec #:  [id number]    Height:       60.0 in Accession #:    [id number]   Weight:       82.2 lb Date of Birth:  03-18-1951    BSA:          1.279 m Patient Age:    72 years     BP:           104/91 mmHg Patient Gender: F            HR:           95 bpm. Exam Location:  Inpatient Procedure: 2D Echo, Cardiac Doppler and Color Doppler (Both Spectral and Color            Flow Doppler were utilized during procedure). Indications:    Dyspnea  History:        Patient has prior history of Echocardiogram examinations, most                 recent 02/14/2019. CAD, COPD; Risk Factors:Hypertension,                 Diabetes and Dyslipidemia.  Sonographer:    Meagan Baucom RDCS, FE, PE Referring Phys: Darreld Mclean MD IMPRESSIONS  1. Left ventricular ejection fraction, by estimation, is 60 to 65%. The left ventricle has normal function. The left ventricle has  no  regional wall motion abnormalities. Left ventricular diastolic parameters are consistent with Grade I diastolic dysfunction (impaired relaxation). There is the interventricular septum is flattened in systole and diastole, consistent with right ventricular pressure and volume overload.  2. Right ventricular systolic function is mildly reduced. The right ventricular size is normal.  3. The mitral valve is normal in structure. No evidence of mitral valve regurgitation. No evidence of mitral stenosis.  4. The aortic valve is tricuspid. Aortic valve regurgitation is trivial. Aortic valve sclerosis is present, with no evidence of aortic valve stenosis.  5. The inferior vena cava is normal in size with greater than 50% respiratory variability, suggesting right atrial pressure of 3 mmHg. FINDINGS  Left Ventricle: Left ventricular ejection fraction, by estimation, is 60 to 65%. The left ventricle has normal function. The left ventricle has no regional wall motion abnormalities. The left ventricular internal cavity size was normal in size. There is  no left ventricular hypertrophy. The interventricular septum is flattened in systole and diastole, consistent with right ventricular pressure and volume overload. Left ventricular diastolic parameters are consistent with Grade I diastolic dysfunction (impaired relaxation). Right Ventricle: The right ventricular size is normal. Right ventricular systolic function is mildly reduced. Left Atrium: Left atrial size was normal in size. Right Atrium: Right atrial size was normal in size. Pericardium: Trivial pericardial effusion is present. Mitral Valve: The mitral valve is normal in structure. No evidence of mitral valve regurgitation. No evidence of mitral valve stenosis. Tricuspid Valve: The tricuspid valve is normal in structure. Tricuspid valve regurgitation is trivial. No evidence of tricuspid stenosis. Aortic Valve: The aortic valve is tricuspid. Aortic valve regurgitation is  trivial. Aortic valve sclerosis is present, with no evidence of aortic valve stenosis. Aortic valve mean gradient measures 7.0 mmHg. Aortic valve peak gradient measures 12.7 mmHg.  Aortic valve area, by VTI measures 2.58 cm. Pulmonic Valve: The pulmonic valve was normal in structure. Pulmonic valve regurgitation is not visualized. No evidence of pulmonic stenosis. Aorta: The aortic root is normal in size and structure. Venous: The inferior vena cava is normal in size with greater than 50% respiratory variability, suggesting right atrial pressure of 3 mmHg. IAS/Shunts: No atrial level shunt detected by color flow Doppler.  LEFT VENTRICLE PLAX 2D LVIDd:         4.70 cm   Diastology LVIDs:         3.00 cm   LV e' medial:    5.66 cm/s LV PW:         1.10 cm   LV E/e' medial:  12.9 LV IVS:        1.10 cm   LV e' lateral:   11.10 cm/s LVOT diam:     2.20 cm   LV E/e' lateral: 6.6 LV SV:         74 LV SV Index:   58 LVOT Area:     3.80 cm  RIGHT VENTRICLE RV Basal diam:  2.30 cm RV Mid diam:    1.90 cm RV S prime:     12.60 cm/s TAPSE (M-mode): 1.2 cm LEFT ATRIUM             Index        RIGHT ATRIUM           Index LA diam:        3.20 cm 2.50 cm/m   RA Area:     13.50 cm LA Vol (A2C):   35.3 ml 27.59 ml/m  RA Volume:   27.70 ml  21.65 ml/m LA Vol (A4C):   36.7 ml 28.69 ml/m LA Biplane Vol: 37.7 ml 29.47 ml/m  AORTIC VALVE AV Area (Vmax):    2.65 cm AV Area (Vmean):   2.49 cm AV Area (VTI):     2.58 cm AV Vmax:           178.00 cm/s AV Vmean:          116.000 cm/s AV VTI:            0.286 m AV Peak Grad:      12.7 mmHg AV Mean Grad:      7.0 mmHg LVOT Vmax:         124.00 cm/s LVOT Vmean:        76.000 cm/s LVOT VTI:          0.194 m LVOT/AV VTI ratio: 0.68  AORTA Ao Root diam: 3.10 cm Ao Asc diam:  3.50 cm MITRAL VALVE MV Area (PHT): 4.63 cm    SHUNTS MV Decel Time: 164 msec    Systemic VTI:  0.19 m MV E velocity: 72.80 cm/s  Systemic Diam: 2.20 cm MV A velocity: 95.90 cm/s MV E/A ratio:  0.76 Olga Millers  MD Electronically signed by Olga Millers MD Signature Date/Time: 06/19/2023/11:48:10 AM    Final    CT Angio Chest PE W and/or Wo Contrast Result Date: 06/18/2023 CLINICAL DATA:  Pulmonary embolism suspected, high probability. Shortness of breath. EXAM: CT ANGIOGRAPHY CHEST WITH CONTRAST TECHNIQUE: Multidetector CT imaging of the chest was performed using the standard protocol during bolus administration of intravenous contrast. Multiplanar CT image reconstructions and MIPs were obtained to evaluate the vascular anatomy. RADIATION DOSE REDUCTION: This exam was performed according to the departmental dose-optimization program which includes automated exposure control, adjustment of the mA and/or kV according to patient size and/or use of iterative reconstruction technique. CONTRAST:  75mL OMNIPAQUE IOHEXOL 350 MG/ML SOLN COMPARISON:  09/11/2021, 04/13/2021. FINDINGS: Cardiovascular: The heart is enlarged and there is no pericardial effusion. Multi-vessel coronary artery calcifications are noted. 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 The pulmonary trunk is mildly distended which may be associated with underlying pulmonary artery hypertension. No pulmonary embolism is seen. Evaluation of the pulmonary arteries at the lung bases is limited due to respiratory motion artifact. Mediastinum/Nodes: No enlarged mediastinal, hilar, or axillary lymph nodes. Thyroid gland, trachea, and esophagus demonstrate no significant findings. Lungs/Pleura: Scattered atelectasis or scarring is noted bilaterally. There are small bilateral pleural effusions with dependent atelectasis in the lower lobes. A nodular opacity is noted in the right middle lobe measuring 1.1 cm, versus 1.0 cm on the previous exam. There is a 7 mm nodule in the left lower lobe, versus 6 mm on the previous exam, axial image 72. No new nodule is seen. No pneumothorax. Upper Abdomen:  There is thickening of the left adrenal gland without evidence of discrete nodule. An exophytic hypodensity is noted in the mid right kidney, previously characterized as cyst. No acute abnormality. Musculoskeletal: Degenerative changes are present in the thoracic spine. No acute osseous abnormality. Review of the MIP images confirms the above findings. IMPRESSION: 1. No evidence of pulmonary embolism. 2. Scattered atelectasis or scarring bilaterally. 3. Small bilateral pleural effusions. 4. Scattered pulmonary nodules bilaterally measuring up to 1.1 cm, not significantly changed from 2022. Given stability over time, 1 year follow-up chest CT is recommended for high-risk patients. 5. Multi-vessel  coronary artery calcifications. 6. 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 Recommend annual imaging followup by CTA or MRA. This recommendation follows 2010 ACCF/AHA/AATS/ACR/ASA/SCA/SCAI/SIR/STS/SVM Guidelines for the Diagnosis and Management of Patients with Thoracic Aortic Disease. Circulation. 2010; 121: Q657-Q469. Aortic aneurysm NOS (ICD10-I71.9) Electronically Signed   By: Thornell Sartorius M.D.   On: 06/18/2023 22:25   DG Chest Portable 1 View Result Date: 06/18/2023 CLINICAL DATA:  Shortness of breath EXAM: PORTABLE CHEST 1 VIEW COMPARISON:  05/14/2015 FINDINGS: Heart and mediastinal contours within normal limits. Aortic atherosclerosis. Bilateral lower lobe airspace opacities. No visible effusions. No acute bony abnormality. IMPRESSION: Bilateral lower lobe airspace opacities could reflect atelectasis or pneumonia. Electronically Signed   By: Charlett Nose M.D.   On: 06/18/2023 17:15    Microbiology: No results found for this or any previous visit (from the past 240 hours).   Labs: Basic Metabolic Panel: Recent Labs  Lab 07/03/23 0409  NA 139  K 3.7  CL 102  CO2 26  GLUCOSE 102*  BUN 28*  CREATININE 1.39*  CALCIUM 8.8*   Liver Function Tests: No  results for input(s): "AST", "ALT", "ALKPHOS", "BILITOT", "PROT", "ALBUMIN" in the last 168 hours. No results for input(s): "LIPASE", "AMYLASE" in the last 168 hours. No results for input(s): "AMMONIA" in the last 168 hours. CBC: No results for input(s): "WBC", "NEUTROABS", "HGB", "HCT", "MCV", "PLT" in the last 168 hours. Cardiac Enzymes: No results for input(s): "CKTOTAL", "CKMB", "CKMBINDEX", "TROPONINI" in the last 168 hours. BNP: BNP (last 3 results) Recent Labs    06/18/23 1648  BNP 715.9*    ProBNP (last 3 results) No results for input(s): "PROBNP" in the last 8760 hours.  CBG: No results for input(s): "GLUCAP" in the last 168 hours.     Signed:  Zannie Cove MD.  [facility] 07/06/2023, 10:57 AM

## 2023-07-06 NOTE — TOC Progression Note (Addendum)
 Transition of Care [facility]) - Progression Note    Patient Details  Name: [name] [name] MRN: [id number] Date of Birth: [birth year]  Transition of Care [facility]) CM/SW Contact  Michaela Corner, [place] Phone Number: 07/06/2023, 10:15 AM  Clinical Narrative:   Wynona Canes informed CSW that appeal has been approved and patient can discharge to [facility] for SNF.  Rhonda at [employer] called CSW about appeal decision still pending in [place]. Per Bjorn Loser, CSW just needs appeal decision letter to send over to [facility] for patient to DC today.   CSW called patients insurance about appeal decision letter. Per insurance, the determination has not been update and a letter cannot be faxed. Per insurance, it takes 24 hours for the determination to reflect in [place] and the approval letter to be generated. CSW will continue to check on approval status.   10:54 AM Appeal status updated in [place]. Appeal has been overturned. Approval dates 07/06/2023-07/10/2023 ; auth id [id number].   TOC will continue to follow.    Expected Discharge Plan: Skilled Nursing Facility Barriers to Discharge: Continued Medical Work up, SNF Pending bed offer, Insurance Authorization  Expected Discharge Plan and Services In-house Referral: Clinical Social Work     Living arrangements for the past 2 months: Single Family Home                                       Social Determinants of Health (SDOH) Interventions SDOH Screenings   Food Insecurity: No Food Insecurity (06/19/2023)  Housing: Low Risk  (06/26/2023)  Transportation Needs: No Transportation Needs (06/26/2023)  Utilities: Not At Risk (06/19/2023)  Alcohol Screen: Low Risk  (06/26/2023)  Depression (PHQ2-9): Low Risk  (05/14/2023)  Recent Concern: Depression (PHQ2-9) - High Risk (05/14/2023)  Financial Resource Strain: Low Risk  (06/26/2023)  Physical Activity: Inactive (05/14/2023)  Social Connections: Socially Isolated (06/19/2023)  Stress: No Stress Concern  Present (05/14/2023)  Tobacco Use: Medium Risk (06/18/2023)  Health Literacy: Adequate Health Literacy (05/14/2023)    Readmission Risk Interventions     No data to display

## 2023-07-06 NOTE — TOC Transition Note (Signed)
 Transition of Care [facility]) - Discharge Note   Patient Details  Name: [name] [name] MRN: [id number] Date of Birth: November 23, 1950  [facility]) CM/SW Contact:  Michaela Corner, LCSWA Phone Number: 07/06/2023, 10:58 AM   Clinical Narrative:   Patient will DC to: [facility]  Anticipated DC date: 07/06/23 Family notified: Wynona Canes (dtr) Transport by: Sharin Mons   Per MD patient ready for DC to [facility]. RN to call report prior to discharge [phone] ext [phone]; room 218). RN, patient, patient's family, and facility notified of DC. Discharge Summary and FL2 sent to facility. DC packet on chart. Ambulance transport requested for patient at 11:06AM.   CSW will sign off for now as social work intervention is no longer needed. Please consult [place] again if new needs arise.      Final next level of care: Skilled Nursing Facility Barriers to Discharge: Barriers Resolved   Patient Goals and CMS Choice Patient states their goals for this hospitalization and ongoing recovery are:: To get better          Discharge Placement              Patient [facility] bed at: [facility] Patient to be transferred to facility by: [facility] Name of family member notified: Wynona Canes (dtr) Patient and family notified of of transfer: 07/06/23  Discharge Plan and Services Additional resources added to the After Visit Summary for   In-house Referral: Clinical Social Work                                   Social Drivers of Health (SDOH) Interventions SDOH Screenings   Food Insecurity: No Food Insecurity (06/19/2023)  Housing: Low Risk  (06/26/2023)  Transportation Needs: No Transportation Needs (06/26/2023)  Utilities: Not At Risk (06/19/2023)  Alcohol Screen: Low Risk  (06/26/2023)  Depression (PHQ2-9): Low Risk  (05/14/2023)  Recent Concern: Depression (PHQ2-9) - High Risk (05/14/2023)  Financial Resource Strain: Low Risk  (06/26/2023)  Physical Activity: Inactive  (05/14/2023)  Social Connections: Socially Isolated (06/19/2023)  Stress: No Stress Concern Present (05/14/2023)  Tobacco Use: Medium Risk (06/18/2023)  Health Literacy: Adequate Health Literacy (05/14/2023)     Readmission Risk Interventions     No data to display

## 2023-07-06 NOTE — Progress Notes (Signed)
 Completed AVA and placed at front desk for PTAR.

## 2023-07-09 ENCOUNTER — Telehealth (HOSPITAL_COMMUNITY): Payer: Self-pay

## 2023-07-09 DIAGNOSIS — J4489 Other specified chronic obstructive pulmonary disease: Secondary | ICD-10-CM | POA: Diagnosis not present

## 2023-07-09 DIAGNOSIS — I10 Essential (primary) hypertension: Secondary | ICD-10-CM | POA: Diagnosis not present

## 2023-07-09 DIAGNOSIS — G4700 Insomnia, unspecified: Secondary | ICD-10-CM | POA: Diagnosis not present

## 2023-07-09 DIAGNOSIS — I25119 Atherosclerotic heart disease of native coronary artery with unspecified angina pectoris: Secondary | ICD-10-CM | POA: Diagnosis not present

## 2023-07-09 DIAGNOSIS — I5033 Acute on chronic diastolic (congestive) heart failure: Secondary | ICD-10-CM | POA: Diagnosis not present

## 2023-07-09 DIAGNOSIS — E782 Mixed hyperlipidemia: Secondary | ICD-10-CM | POA: Diagnosis not present

## 2023-07-09 DIAGNOSIS — M069 Rheumatoid arthritis, unspecified: Secondary | ICD-10-CM | POA: Diagnosis not present

## 2023-07-09 DIAGNOSIS — J9601 Acute respiratory failure with hypoxia: Secondary | ICD-10-CM | POA: Diagnosis not present

## 2023-07-09 DIAGNOSIS — N1831 Chronic kidney disease, stage 3a: Secondary | ICD-10-CM | POA: Diagnosis not present

## 2023-07-09 NOTE — Telephone Encounter (Signed)
 Called to confirm/remind patient of [facility] on 07/10/2023 11:00.   Appointment:   [] Confirmed  [x] Left mess   [x] No answer/No voice mail/Mailbox Full  [] Phone not in service  Patient reminded to bring all medications and/or complete list.  Confirmed patient has transportation. Gave directions, instructed to utilize valet parking.

## 2023-07-10 ENCOUNTER — Encounter (HOSPITAL_COMMUNITY)

## 2023-07-10 DIAGNOSIS — G894 Chronic pain syndrome: Secondary | ICD-10-CM | POA: Diagnosis not present

## 2023-07-10 DIAGNOSIS — I10 Essential (primary) hypertension: Secondary | ICD-10-CM | POA: Diagnosis not present

## 2023-07-10 DIAGNOSIS — J9601 Acute respiratory failure with hypoxia: Secondary | ICD-10-CM | POA: Diagnosis not present

## 2023-07-10 DIAGNOSIS — I5033 Acute on chronic diastolic (congestive) heart failure: Secondary | ICD-10-CM | POA: Diagnosis not present

## 2023-07-10 DIAGNOSIS — E782 Mixed hyperlipidemia: Secondary | ICD-10-CM | POA: Diagnosis not present

## 2023-07-10 DIAGNOSIS — I25119 Atherosclerotic heart disease of native coronary artery with unspecified angina pectoris: Secondary | ICD-10-CM | POA: Diagnosis not present

## 2023-07-10 DIAGNOSIS — M069 Rheumatoid arthritis, unspecified: Secondary | ICD-10-CM | POA: Diagnosis not present

## 2023-07-10 DIAGNOSIS — N1831 Chronic kidney disease, stage 3a: Secondary | ICD-10-CM | POA: Diagnosis not present

## 2023-07-10 DIAGNOSIS — J4489 Other specified chronic obstructive pulmonary disease: Secondary | ICD-10-CM | POA: Diagnosis not present

## 2023-07-11 DIAGNOSIS — J9601 Acute respiratory failure with hypoxia: Secondary | ICD-10-CM | POA: Diagnosis not present

## 2023-07-11 DIAGNOSIS — N1831 Chronic kidney disease, stage 3a: Secondary | ICD-10-CM | POA: Diagnosis not present

## 2023-07-11 DIAGNOSIS — M069 Rheumatoid arthritis, unspecified: Secondary | ICD-10-CM | POA: Diagnosis not present

## 2023-07-11 DIAGNOSIS — E782 Mixed hyperlipidemia: Secondary | ICD-10-CM | POA: Diagnosis not present

## 2023-07-11 DIAGNOSIS — I10 Essential (primary) hypertension: Secondary | ICD-10-CM | POA: Diagnosis not present

## 2023-07-11 DIAGNOSIS — J4489 Other specified chronic obstructive pulmonary disease: Secondary | ICD-10-CM | POA: Diagnosis not present

## 2023-07-11 DIAGNOSIS — I5033 Acute on chronic diastolic (congestive) heart failure: Secondary | ICD-10-CM | POA: Diagnosis not present

## 2023-07-11 DIAGNOSIS — I25119 Atherosclerotic heart disease of native coronary artery with unspecified angina pectoris: Secondary | ICD-10-CM | POA: Diagnosis not present

## 2023-07-12 DIAGNOSIS — I5033 Acute on chronic diastolic (congestive) heart failure: Secondary | ICD-10-CM | POA: Diagnosis not present

## 2023-07-12 DIAGNOSIS — I25119 Atherosclerotic heart disease of native coronary artery with unspecified angina pectoris: Secondary | ICD-10-CM | POA: Diagnosis not present

## 2023-07-12 DIAGNOSIS — J9601 Acute respiratory failure with hypoxia: Secondary | ICD-10-CM | POA: Diagnosis not present

## 2023-07-12 DIAGNOSIS — E782 Mixed hyperlipidemia: Secondary | ICD-10-CM | POA: Diagnosis not present

## 2023-07-12 DIAGNOSIS — J4489 Other specified chronic obstructive pulmonary disease: Secondary | ICD-10-CM | POA: Diagnosis not present

## 2023-07-12 DIAGNOSIS — I10 Essential (primary) hypertension: Secondary | ICD-10-CM | POA: Diagnosis not present

## 2023-07-12 DIAGNOSIS — N1831 Chronic kidney disease, stage 3a: Secondary | ICD-10-CM | POA: Diagnosis not present

## 2023-07-12 DIAGNOSIS — M069 Rheumatoid arthritis, unspecified: Secondary | ICD-10-CM | POA: Diagnosis not present

## 2023-07-13 DIAGNOSIS — I5033 Acute on chronic diastolic (congestive) heart failure: Secondary | ICD-10-CM | POA: Diagnosis not present

## 2023-07-13 DIAGNOSIS — J4489 Other specified chronic obstructive pulmonary disease: Secondary | ICD-10-CM | POA: Diagnosis not present

## 2023-07-13 DIAGNOSIS — J9601 Acute respiratory failure with hypoxia: Secondary | ICD-10-CM | POA: Diagnosis not present

## 2023-07-13 DIAGNOSIS — I10 Essential (primary) hypertension: Secondary | ICD-10-CM | POA: Diagnosis not present

## 2023-07-13 DIAGNOSIS — M069 Rheumatoid arthritis, unspecified: Secondary | ICD-10-CM | POA: Diagnosis not present

## 2023-07-13 DIAGNOSIS — N1831 Chronic kidney disease, stage 3a: Secondary | ICD-10-CM | POA: Diagnosis not present

## 2023-07-13 DIAGNOSIS — E782 Mixed hyperlipidemia: Secondary | ICD-10-CM | POA: Diagnosis not present

## 2023-07-13 DIAGNOSIS — I25119 Atherosclerotic heart disease of native coronary artery with unspecified angina pectoris: Secondary | ICD-10-CM | POA: Diagnosis not present

## 2023-07-16 DIAGNOSIS — N1831 Chronic kidney disease, stage 3a: Secondary | ICD-10-CM | POA: Diagnosis not present

## 2023-07-16 DIAGNOSIS — J9601 Acute respiratory failure with hypoxia: Secondary | ICD-10-CM | POA: Diagnosis not present

## 2023-07-16 DIAGNOSIS — I25119 Atherosclerotic heart disease of native coronary artery with unspecified angina pectoris: Secondary | ICD-10-CM | POA: Diagnosis not present

## 2023-07-16 DIAGNOSIS — M069 Rheumatoid arthritis, unspecified: Secondary | ICD-10-CM | POA: Diagnosis not present

## 2023-07-16 DIAGNOSIS — J4489 Other specified chronic obstructive pulmonary disease: Secondary | ICD-10-CM | POA: Diagnosis not present

## 2023-07-16 DIAGNOSIS — I5033 Acute on chronic diastolic (congestive) heart failure: Secondary | ICD-10-CM | POA: Diagnosis not present

## 2023-07-16 DIAGNOSIS — I10 Essential (primary) hypertension: Secondary | ICD-10-CM | POA: Diagnosis not present

## 2023-07-16 DIAGNOSIS — E782 Mixed hyperlipidemia: Secondary | ICD-10-CM | POA: Diagnosis not present

## 2023-07-18 DIAGNOSIS — J4489 Other specified chronic obstructive pulmonary disease: Secondary | ICD-10-CM | POA: Diagnosis not present

## 2023-07-18 DIAGNOSIS — M069 Rheumatoid arthritis, unspecified: Secondary | ICD-10-CM | POA: Diagnosis not present

## 2023-07-18 DIAGNOSIS — E782 Mixed hyperlipidemia: Secondary | ICD-10-CM | POA: Diagnosis not present

## 2023-07-18 DIAGNOSIS — I5033 Acute on chronic diastolic (congestive) heart failure: Secondary | ICD-10-CM | POA: Diagnosis not present

## 2023-07-18 DIAGNOSIS — I25119 Atherosclerotic heart disease of native coronary artery with unspecified angina pectoris: Secondary | ICD-10-CM | POA: Diagnosis not present

## 2023-07-18 DIAGNOSIS — I10 Essential (primary) hypertension: Secondary | ICD-10-CM | POA: Diagnosis not present

## 2023-07-18 DIAGNOSIS — N1831 Chronic kidney disease, stage 3a: Secondary | ICD-10-CM | POA: Diagnosis not present

## 2023-07-18 DIAGNOSIS — J9601 Acute respiratory failure with hypoxia: Secondary | ICD-10-CM | POA: Diagnosis not present

## 2023-07-20 DIAGNOSIS — I25119 Atherosclerotic heart disease of native coronary artery with unspecified angina pectoris: Secondary | ICD-10-CM | POA: Diagnosis not present

## 2023-07-20 DIAGNOSIS — I10 Essential (primary) hypertension: Secondary | ICD-10-CM | POA: Diagnosis not present

## 2023-07-20 DIAGNOSIS — N1831 Chronic kidney disease, stage 3a: Secondary | ICD-10-CM | POA: Diagnosis not present

## 2023-07-20 DIAGNOSIS — E782 Mixed hyperlipidemia: Secondary | ICD-10-CM | POA: Diagnosis not present

## 2023-07-20 DIAGNOSIS — J4489 Other specified chronic obstructive pulmonary disease: Secondary | ICD-10-CM | POA: Diagnosis not present

## 2023-07-20 DIAGNOSIS — I5033 Acute on chronic diastolic (congestive) heart failure: Secondary | ICD-10-CM | POA: Diagnosis not present

## 2023-07-20 DIAGNOSIS — M069 Rheumatoid arthritis, unspecified: Secondary | ICD-10-CM | POA: Diagnosis not present

## 2023-07-20 DIAGNOSIS — J9601 Acute respiratory failure with hypoxia: Secondary | ICD-10-CM | POA: Diagnosis not present

## 2023-07-24 DIAGNOSIS — M069 Rheumatoid arthritis, unspecified: Secondary | ICD-10-CM | POA: Diagnosis not present

## 2023-07-24 DIAGNOSIS — I5033 Acute on chronic diastolic (congestive) heart failure: Secondary | ICD-10-CM | POA: Diagnosis not present

## 2023-07-24 DIAGNOSIS — Z7982 Long term (current) use of aspirin: Secondary | ICD-10-CM | POA: Diagnosis not present

## 2023-07-24 DIAGNOSIS — J449 Chronic obstructive pulmonary disease, unspecified: Secondary | ICD-10-CM | POA: Diagnosis not present

## 2023-07-24 DIAGNOSIS — R911 Solitary pulmonary nodule: Secondary | ICD-10-CM | POA: Diagnosis not present

## 2023-07-24 DIAGNOSIS — J9601 Acute respiratory failure with hypoxia: Secondary | ICD-10-CM | POA: Diagnosis not present

## 2023-07-24 DIAGNOSIS — I130 Hypertensive heart and chronic kidney disease with heart failure and stage 1 through stage 4 chronic kidney disease, or unspecified chronic kidney disease: Secondary | ICD-10-CM | POA: Diagnosis not present

## 2023-07-24 DIAGNOSIS — I272 Pulmonary hypertension, unspecified: Secondary | ICD-10-CM | POA: Diagnosis not present

## 2023-07-24 DIAGNOSIS — I25119 Atherosclerotic heart disease of native coronary artery with unspecified angina pectoris: Secondary | ICD-10-CM | POA: Diagnosis not present

## 2023-07-24 DIAGNOSIS — Z9181 History of falling: Secondary | ICD-10-CM | POA: Diagnosis not present

## 2023-07-24 DIAGNOSIS — N179 Acute kidney failure, unspecified: Secondary | ICD-10-CM | POA: Diagnosis not present

## 2023-07-24 DIAGNOSIS — N1832 Chronic kidney disease, stage 3b: Secondary | ICD-10-CM | POA: Diagnosis not present

## 2023-07-24 DIAGNOSIS — E782 Mixed hyperlipidemia: Secondary | ICD-10-CM | POA: Diagnosis not present

## 2023-07-25 ENCOUNTER — Telehealth: Payer: Self-pay

## 2023-07-25 NOTE — Telephone Encounter (Signed)
 Copied from CRM [id number]. Topic: Clinical - Home Health Verbal Orders >> Jul 25, 2023  8:51 AM Juluis Ok wrote: Caller/Agency: [facility] Callback Number: [phone] Service Requested: Home Health Physical Therapy, Social Worker Evaluation Frequency: 1x1, 2x4, 1x4 Any new concerns about the patient? No

## 2023-07-25 NOTE — Telephone Encounter (Signed)
 Ok for VO

## 2023-07-25 NOTE — Telephone Encounter (Signed)
 Please okay these orders  ?

## 2023-07-26 NOTE — Telephone Encounter (Signed)
 Left a detailed message for Gracie with [facility] with approval for VO requested

## 2023-07-31 ENCOUNTER — Encounter: Payer: Self-pay | Admitting: Family Medicine

## 2023-07-31 ENCOUNTER — Telehealth (HOSPITAL_COMMUNITY): Payer: Self-pay

## 2023-07-31 ENCOUNTER — Ambulatory Visit (INDEPENDENT_AMBULATORY_CARE_PROVIDER_SITE_OTHER): Admitting: Family Medicine

## 2023-07-31 VITALS — BP 110/64 | HR 33 | Temp 98.5°F | Wt 182.0 lb

## 2023-07-31 DIAGNOSIS — H6121 Impacted cerumen, right ear: Secondary | ICD-10-CM | POA: Diagnosis not present

## 2023-07-31 DIAGNOSIS — J4489 Other specified chronic obstructive pulmonary disease: Secondary | ICD-10-CM

## 2023-07-31 DIAGNOSIS — N1831 Chronic kidney disease, stage 3a: Secondary | ICD-10-CM | POA: Diagnosis not present

## 2023-07-31 DIAGNOSIS — I10 Essential (primary) hypertension: Secondary | ICD-10-CM

## 2023-07-31 DIAGNOSIS — J9601 Acute respiratory failure with hypoxia: Secondary | ICD-10-CM | POA: Diagnosis not present

## 2023-07-31 DIAGNOSIS — F321 Major depressive disorder, single episode, moderate: Secondary | ICD-10-CM

## 2023-07-31 DIAGNOSIS — R001 Bradycardia, unspecified: Secondary | ICD-10-CM | POA: Diagnosis not present

## 2023-07-31 DIAGNOSIS — I25119 Atherosclerotic heart disease of native coronary artery with unspecified angina pectoris: Secondary | ICD-10-CM

## 2023-07-31 DIAGNOSIS — I5033 Acute on chronic diastolic (congestive) heart failure: Secondary | ICD-10-CM | POA: Diagnosis not present

## 2023-07-31 DIAGNOSIS — H9193 Unspecified hearing loss, bilateral: Secondary | ICD-10-CM

## 2023-07-31 MED ORDER — ALPRAZOLAM 0.25 MG PO TABS: 0.2500 mg | ORAL_TABLET | Freq: Three times a day (TID) | ORAL | 3 refills | Status: DC | PRN

## 2023-07-31 NOTE — Telephone Encounter (Signed)
 Spoke with Gracie with [facility] state that she received the approval for VO requested

## 2023-07-31 NOTE — Progress Notes (Signed)
 HEART & VASCULAR TRANSITION OF CARE CONSULT NOTE    Referring Physician: Dr Derenda Flax, Lenis Quin, MD  Cardiology: Dr. Abel Hoe  Chief Complaint: Heart Failure   HPI: Referred to clinic by Dr Drexel Gentles for heart failure consultation.   [name] [name] is a 73 y.o. female chronic HFpEF, CAD, HLD, and CKD Stage IIIa.   Admitted 3/17-07/03/2023 with acute respiratory failure and A/C HFpEF. HS Trop elevated. Had cath with diffuse coronary disease. Diuresed with IV lasix  and transitioned to lasix  40 mg daily. Placed on Toprol  XL and Spiro.   Did rehab at [facility] 4/4-4/16.   Today she presents for AHF [facility] clinic visit. Overall feeling ok, feels weak and tired (baseline for her). Denies CP, dizziness, edema, or PND/Orthopnea. Feels palpitations randomly. SOB with prolonged activity. Appetite ok. No fever or chills. Does not weight at home. Taking all medications. Denies ETOH, tobacco or drug use. Quit smoking 01/28/2016. Cardiac family history only in his brother (does not recall exactly what).   Cardiac Testing  - Echo 3/25  EF 60-65% Grade IDD. No WMA - Cath 3/25 Nonobstructive CAD. Diffuse disease. RA 13, PA 65/33 (45) , PCWP 20, CO 4.2 CI 2.2    Past Medical History:  Diagnosis Date   Allergic rhinitis    ALLERGIC RHINITIS 05/08/2007   Qualifier: Diagnosis of  By: Derick Fleeting, CMA, Cindy     Allergy    seasonal   Anxiety    Anxiety state 05/25/2014   Aortic valve disease    a. ? possible bicuspid AV.   CAD (coronary artery disease)    a. 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 mild dilated aortic root on prior echo, ascending aorta prominence on CT 2015.   Duodenal mass    GERD 05/08/2007   Qualifier: Diagnosis of  By: Derick Fleeting, CMA, Cindy     Hyperlipidemia    Hypertension    Insomnia    Neck pain, chronic 03/09/2016   NICOTINE ADDICTION 05/08/2007   Qualifier: Diagnosis of  By: Alyne Babinski MD, Mara Seminole A    Osteoarthritis    Overactive bladder    Pneumonia    once or twivce in past   Premature ventricular contractions    LBBB inferior axis PVCs   Thoracic back pain 05/30/2014   Tobacco abuse    Ulcer    duodenal    Current Outpatient Medications  Medication Sig Dispense Refill   albuterol  (PROAIR  HFA) 108 (90 Base) MCG/ACT inhaler Inhale 2 puffs into the lungs every 6 (six) hours as needed for wheezing. 8.5 each 11   ALPRAZolam  (XANAX ) 0.25 MG tablet Take 1 tablet (0.25 mg total) by mouth 3 (three) times daily as needed for anxiety. TAKE 3 TABLETS BY MOUTH EVERY 6 HOURS AS NEEDED 90 tablet 3   amLODipine  (NORVASC ) 5 MG tablet Take 1 tablet (5 mg total) by mouth daily. 30 tablet 5   ARIPiprazole  (ABILIFY ) 2 MG  tablet Take 2 mg by mouth daily.     aspirin  EC 81 MG tablet Take 1 tablet (81 mg total) by mouth daily. 90 tablet 3   atorvastatin  (LIPITOR) 20 MG tablet Take 1 tablet (20 mg total) by mouth daily. 30 tablet 0   cetirizine (ZYRTEC) 10 MG tablet Take 10 mg by mouth daily as needed for allergies.      cholecalciferol (VITAMIN D3) 25 MCG (1000 UNIT) tablet Take 1,000 Units by mouth daily.     cyclobenzaprine  (FLEXERIL ) 10 MG tablet Take 10 mg by mouth 3 (three) times daily as needed for muscle spasms. Take 0.5 mg by mouth three times daily for muscle spasms     furosemide  (LASIX ) 40 MG tablet Take 1 tablet (40 mg total) by mouth daily. 30 tablet 0   lidocaine  (HM LIDOCAINE  PATCH) 4 % Place 1 patch onto the skin daily.     magnesium  oxide (MAG-OX) 400 (240 Mg) MG tablet  TAKE 1 TABLET BY MOUTH EVERY DAY 90 tablet 3   MELATONIN PO Take by mouth at bedtime. OTC     mexiletine (MEXITIL ) 150 MG capsule Take 1 capsule (150 mg total) by mouth every 12 (twelve) hours. 60 capsule 0   nitroGLYCERIN  (NITROSTAT ) 0.4 MG SL tablet Place 1 tablet (0.4 mg total) under the tongue every 5 (five) minutes as needed for chest pain. 25 tablet 3   senna-docusate (SENOKOT-S) 8.6-50 MG tablet Take 1 tablet by mouth 2 (two) times daily. 20 tablet 0   spironolactone  (ALDACTONE ) 25 MG tablet Take 1 tablet (25 mg total) by mouth daily. 30 tablet 0   traMADol  (ULTRAM ) 50 MG tablet Take 1 tablet (50 mg total) by mouth every 6 (six) hours as needed for moderate pain (pain score 4-6). 20 tablet 0   No current facility-administered medications for this encounter.    Allergies  Allergen Reactions   Augmentin  [Amoxicillin -Pot Clavulanate] Nausea And Vomiting   Clindamycin/Lincomycin Nausea And Vomiting   Colchicine  Diarrhea   Flecainide  Other (See Comments)    Conjunctivitis    Hydroxychloroquine Rash      Social History   Socioeconomic History   Marital status: Single    Spouse name: Not on file   Number of children: 0   Years of education: Not on file   Highest education level: High school graduate  Occupational History   Occupation: CNA   Occupation: retired  Tobacco Use   Smoking status: Former    Current packs/day: 0.00    Average packs/day: 0.5 packs/day for 54.0 years (24.3 ttl pk-yrs)    Types: Cigarettes    Start date: 01/02/1963    Quit date: 01/01/2017    Years since quitting: 6.5   Smokeless tobacco: Never   Tobacco comments:    1/2 pack per day or less  Vaping Use   Vaping status: Never Used  Substance and Sexual Activity   Alcohol use: No    Alcohol/week: 0.0 standard drinks of alcohol   Drug use: No   Sexual activity: Not on file  Other Topics Concern   Not on file  Social History Narrative   Works as Lawyer at [employer].   Social Drivers of  Corporate investment banker Strain: Low Risk  (06/26/2023)   Overall Financial Resource Strain (CARDIA)    Difficulty of Paying Living Expenses: Not very hard  Food Insecurity: No Food Insecurity (06/19/2023)   Hunger Vital Sign    Worried About Running Out of Food in the  Last Year: Never true    Ran Out of Food in the Last Year: Never true  Transportation Needs: No Transportation Needs (06/26/2023)   PRAPARE - Administrator, Civil Service (Medical): No    Lack of Transportation (Non-Medical): No  Physical Activity: Inactive (05/14/2023)   Exercise Vital Sign    Days of Exercise per Week: 0 days    Minutes of Exercise per Session: 0 min  Stress: No Stress Concern Present (05/14/2023)   [employer] of Occupational Health - Occupational Stress Questionnaire    Feeling of Stress : Not at all  Social Connections: Socially Isolated (06/19/2023)   Social Connection and Isolation Panel [NHANES]    Frequency of Communication with Friends and Family: Once a week    Frequency of Social Gatherings with Friends and Family: Never    Attends Religious Services: Never    Database administrator or Organizations: No    Attends Banker Meetings: Never    Marital Status: Never married  Intimate Partner Violence: Not At Risk (06/19/2023)   Humiliation, Afraid, Rape, and Kick questionnaire    Fear of Current or Ex-Partner: No    Emotionally Abused: No    Physically Abused: No    Sexually Abused: No      Family History  Problem Relation Age of Onset   CAD Brother    Kidney disease Brother    Emphysema Mother    Other Father        brain tumor   Throat cancer Brother    Hyperlipidemia Other    Hypertension Other    Kidney disease Other    Heart disease Other    Colon cancer Neg Hx    Esophageal cancer Neg Hx    Stomach cancer Neg Hx    Rectal cancer Neg Hx     Vitals:   08/01/23 1412  BP: 116/69  Pulse: (!) 107  SpO2: 93%  Weight: 82.4 kg (181 lb 9.6 oz)   Height: 5' (1.524 m)   PHYSICAL EXAM: General:  elderly appearing.  No respiratory difficulty. Walked in with walker HEENT: HOH Neck: supple. JVD difficult to see but appears flat.  Cor: PMI nondisplaced. Regular rate & rhythm. No rubs, gallops or murmurs. Lungs: clear Extremities: no cyanosis, clubbing, rash, non-pitting BLE edema  Neuro: alert & oriented x 3. Moves all 4 extremities w/o difficulty. Affect pleasant.   Wt Readings from Last 3 Encounters:  08/01/23 82.4 kg (181 lb 9.6 oz)  07/31/23 82.6 kg (182 lb)  07/06/23 82.7 kg (182 lb 5.1 oz)    ECG: NSR 98 bpm (Personally reviewed)    ASSESSMENT & PLAN: Chronic HFpEF Echo 3/25: EF preserved, GIDD, RV mildly reduced L/RHC 3/25: Nonobstructive CAD. Diffuse disease. RA 13, PA 65/33 (45) , PCWP 20, CO 4.2 CI 2.2 NYHA IIIa. Fluid difficult to gauge, suspect dependent edema 2/2 amlodipine .  GDMT  Diuretic- continue Lasix  40 mg daily BB- Restart Toprol  XL 12.5 Ace/ARB/ARNI- Reports frequent falls. BP stable. Would avoid decreasing much more.  MRA continue Spiro 25 mg daily SGLT2i-  not a candidate for SGLT2i with urinary retention - Labs today.   CAD - LHC Nonobstructive CAD. Diffuse disease.  - Continue ASA 81 mg daily - Continue statin - Denies CP  Hx of frequent symptomatic PVCs - Continue mexiletine 150 twice daily - No PVCs on EKG today  HTN - Continue amlodipine  5 mg daily. Would transition to ARB if renal function stable.  5. Urinary retention - Required several caths while admitted and reports bladder scans with high PVR - Refer to nephrology   Referred to HFSW (PCP, Medications, Transportation, ETOH Abuse, Drug Abuse, Insurance, Financial ): No Refer to Pharmacy:  No Refer to Home Health:  No Refer to Advanced Heart Failure Clinic: No  Refer to General Cardiology: Follows with Dr. Abel Hoe  Needs follow up with Dr. Abel Hoe.

## 2023-07-31 NOTE — Progress Notes (Signed)
   Subjective:    Patient ID: [name] [name], female    DOB: 01-21-1951, 73 y.o.   MRN: [id number]  HPI Here to follow up a hospital stay from 06-18-23 to 07-03-23 and then a rehab stay at [facility] from 07-06-23 to 07-18-23. She presented with swelling in both legs and SOB. CXR revealed pulmonary edema. Her admission labs were remarkable for a BNP of 715.9, creatinine of 1.39, GFR of 40, and Hgb of 12.8. an ECHO showed 60-65% EF with Grade I diastolic dysfunction. A chest CTA was negative for emboli, but it showed aortic atherosclerosis, small bilateral pleural effusions, and bilateral pulmonary nodules. She had a left heart catheterization that showed an LAD stenosis of 50-60% but no significant blockages. This also showed some pulmonary HTN. She was treated with IV Lasix , and BiPAP at first. She was then changed to oral Lasix , and she was able to be taken off BiPAP. She quickly improved, but she had generalized weakness and some SOB on exertion. She then stayed at [facility] for PT, and she is getting outpatient PT currently. Her BP has been stable, but her heart rate has been quite slow, averaging in the 30's. This sometimes drops into the 20's. She is taking Lasix  40 mg every morning. Finally he complains of hearing loss in both ears.    Review of Systems  Constitutional:  Positive for fatigue.  Respiratory:  Positive for shortness of breath. Negative for cough and wheezing.   Cardiovascular:  Positive for leg swelling. Negative for chest pain and palpitations.  Gastrointestinal: Negative.   Genitourinary: Negative.   Neurological: Negative.        Objective:   Physical Exam Constitutional:      Comments: Walks with a walker   HENT:     Right Ear: There is impacted cerumen.     Left Ear: Tympanic membrane, ear canal and external ear normal.  Cardiovascular:     Rate and Rhythm: Regular rhythm. Bradycardia present.     Pulses: Normal pulses.     Heart sounds: Normal heart sounds.   Pulmonary:     Effort: Pulmonary effort is normal.     Breath sounds: Normal breath sounds.  Musculoskeletal:     Comments: 1+ edema in both ankles   Neurological:     Mental Status: She is alert.           Assessment & Plan:  She has recovered from an acute exacerbation of CHF as well as an acute respiratory failure with hypoxia. Her renal function is stable. She will continue to take Lasix  40 mg every morning. Due to her bradycardia, we will stop the Metoprolol . She will continue PT. She will be seen in the CHF clinic tomorrow. For the derumen impaction, after informed consent was obtained the ear canal was irrigated clear with water. She tolrerated this well. I suggetsed that she have a formal hearing evaluation. We spent a total of (35   ) minutes reviewing records and discussing these issues.  Corita Diego, MD

## 2023-07-31 NOTE — Telephone Encounter (Signed)
  Called to confirm/remind patient of their appointment at the [facility] on 08/01/2023 2:00.   Appointment:   [] Confirmed  [x] Left mess   [] No answer/No voice mail  [] VM Full/unable to leave message  [] Phone not in service  Patient reminded to bring all medications and/or complete list.  Confirmed patient has transportation. Gave directions, instructed to utilize valet parking.

## 2023-08-01 ENCOUNTER — Encounter (HOSPITAL_COMMUNITY): Payer: Self-pay

## 2023-08-01 ENCOUNTER — Ambulatory Visit (HOSPITAL_COMMUNITY)
Admission: RE | Admit: 2023-08-01 | Discharge: 2023-08-01 | Disposition: A | Discharge status: Home / Self Care | Source: Ambulatory Visit | Attending: Internal Medicine | Admitting: Internal Medicine

## 2023-08-01 VITALS — BP 116/69 | HR 107 | Ht 60.0 in | Wt 181.6 lb

## 2023-08-01 DIAGNOSIS — I5032 Chronic diastolic (congestive) heart failure: Secondary | ICD-10-CM | POA: Diagnosis not present

## 2023-08-01 DIAGNOSIS — Z79899 Other long term (current) drug therapy: Secondary | ICD-10-CM | POA: Insufficient documentation

## 2023-08-01 DIAGNOSIS — E785 Hyperlipidemia, unspecified: Secondary | ICD-10-CM | POA: Diagnosis not present

## 2023-08-01 DIAGNOSIS — N1831 Chronic kidney disease, stage 3a: Secondary | ICD-10-CM | POA: Insufficient documentation

## 2023-08-01 DIAGNOSIS — Z7982 Long term (current) use of aspirin: Secondary | ICD-10-CM | POA: Diagnosis not present

## 2023-08-01 DIAGNOSIS — R339 Retention of urine, unspecified: Secondary | ICD-10-CM | POA: Insufficient documentation

## 2023-08-01 DIAGNOSIS — I25118 Atherosclerotic heart disease of native coronary artery with other forms of angina pectoris: Secondary | ICD-10-CM | POA: Diagnosis not present

## 2023-08-01 DIAGNOSIS — I2510 Atherosclerotic heart disease of native coronary artery without angina pectoris: Secondary | ICD-10-CM | POA: Insufficient documentation

## 2023-08-01 DIAGNOSIS — I493 Ventricular premature depolarization: Secondary | ICD-10-CM

## 2023-08-01 DIAGNOSIS — Z87891 Personal history of nicotine dependence: Secondary | ICD-10-CM | POA: Insufficient documentation

## 2023-08-01 DIAGNOSIS — I10 Essential (primary) hypertension: Secondary | ICD-10-CM | POA: Diagnosis not present

## 2023-08-01 DIAGNOSIS — I5033 Acute on chronic diastolic (congestive) heart failure: Secondary | ICD-10-CM

## 2023-08-01 DIAGNOSIS — I130 Hypertensive heart and chronic kidney disease with heart failure and stage 1 through stage 4 chronic kidney disease, or unspecified chronic kidney disease: Secondary | ICD-10-CM | POA: Diagnosis not present

## 2023-08-01 DIAGNOSIS — R296 Repeated falls: Secondary | ICD-10-CM | POA: Diagnosis not present

## 2023-08-01 LAB — BASIC METABOLIC PANEL WITH GFR
Anion gap: 10 (ref 5–15)
BUN: 24 mg/dL — ABNORMAL HIGH (ref 8–23)
CO2: 28 mmol/L (ref 22–32)
Calcium: 9.6 mg/dL (ref 8.9–10.3)
Chloride: 98 mmol/L (ref 98–111)
Creatinine, Ser: 1.63 mg/dL — ABNORMAL HIGH (ref 0.44–1.00)
GFR, Estimated: 33 mL/min — ABNORMAL LOW (ref >60)
Glucose, Bld: 137 mg/dL — ABNORMAL HIGH (ref 70–99)
Potassium: 4.5 mmol/L (ref 3.5–5.1)
Sodium: 136 mmol/L (ref 135–145)

## 2023-08-01 LAB — BRAIN NATRIURETIC PEPTIDE: B Natriuretic Peptide: 18.6 pg/mL (ref 0.0–100.0)

## 2023-08-01 MED ORDER — METOPROLOL SUCCINATE ER 25 MG PO TB24: 12.5000 mg | ORAL_TABLET | Freq: Every day | ORAL | 3 refills | Status: DC

## 2023-08-01 NOTE — Patient Instructions (Addendum)
 Re-start metoprolol  XL 12.5 mg daily. Rx sent. Labs today - will call you if abnormal. Referral sent to [facility] for your urinary retention. They will call you to scheduled or you may reach them at [phone]. Follow up with your cardiologist. You can call us  at [phone] if any questions.

## 2023-08-01 NOTE — Progress Notes (Signed)
 Called [facility] asking for patient to be seen to evaluate for urinary retention. Faxed patient demographics and copy of today's clinic note.   [facility] Ph:  [phone] Fax:  [phone]

## 2023-08-01 NOTE — Addendum Note (Signed)
 Encounter addended by: Edman Gory, RN on: 08/01/2023 3:09 PM  Actions taken: Clinical Note Signed

## 2023-08-02 ENCOUNTER — Encounter (HOSPITAL_BASED_OUTPATIENT_CLINIC_OR_DEPARTMENT_OTHER): Payer: Self-pay | Admitting: Pulmonary Disease

## 2023-08-02 ENCOUNTER — Telehealth (HOSPITAL_COMMUNITY): Payer: Self-pay | Admitting: Cardiology

## 2023-08-02 ENCOUNTER — Encounter (HOSPITAL_BASED_OUTPATIENT_CLINIC_OR_DEPARTMENT_OTHER): Payer: Self-pay

## 2023-08-02 DIAGNOSIS — I5022 Chronic systolic (congestive) heart failure: Secondary | ICD-10-CM

## 2023-08-02 DIAGNOSIS — I5033 Acute on chronic diastolic (congestive) heart failure: Secondary | ICD-10-CM

## 2023-08-02 MED ORDER — FUROSEMIDE 20 MG PO TABS: 20.0000 mg | ORAL_TABLET | Freq: Every day | ORAL | 11 refills | Status: DC

## 2023-08-02 NOTE — Telephone Encounter (Signed)
 Patient called.  Patient aware.

## 2023-08-02 NOTE — Telephone Encounter (Signed)
-----   Message from Sheryl Donna sent at 08/01/2023  4:56 PM EDT ----- Renal function mildly elevated and fluid marker low. Hold lasix  for 2 days then restart at 20 mg daily. Repeat BMET ~7 days

## 2023-08-03 DIAGNOSIS — I5033 Acute on chronic diastolic (congestive) heart failure: Secondary | ICD-10-CM | POA: Diagnosis not present

## 2023-08-03 DIAGNOSIS — J9601 Acute respiratory failure with hypoxia: Secondary | ICD-10-CM | POA: Diagnosis not present

## 2023-08-03 DIAGNOSIS — J449 Chronic obstructive pulmonary disease, unspecified: Secondary | ICD-10-CM | POA: Diagnosis not present

## 2023-08-03 DIAGNOSIS — Z7982 Long term (current) use of aspirin: Secondary | ICD-10-CM | POA: Diagnosis not present

## 2023-08-03 DIAGNOSIS — E782 Mixed hyperlipidemia: Secondary | ICD-10-CM | POA: Diagnosis not present

## 2023-08-03 DIAGNOSIS — M069 Rheumatoid arthritis, unspecified: Secondary | ICD-10-CM | POA: Diagnosis not present

## 2023-08-03 DIAGNOSIS — R911 Solitary pulmonary nodule: Secondary | ICD-10-CM | POA: Diagnosis not present

## 2023-08-03 DIAGNOSIS — I272 Pulmonary hypertension, unspecified: Secondary | ICD-10-CM | POA: Diagnosis not present

## 2023-08-03 DIAGNOSIS — N179 Acute kidney failure, unspecified: Secondary | ICD-10-CM | POA: Diagnosis not present

## 2023-08-03 DIAGNOSIS — N1832 Chronic kidney disease, stage 3b: Secondary | ICD-10-CM | POA: Diagnosis not present

## 2023-08-03 DIAGNOSIS — I130 Hypertensive heart and chronic kidney disease with heart failure and stage 1 through stage 4 chronic kidney disease, or unspecified chronic kidney disease: Secondary | ICD-10-CM | POA: Diagnosis not present

## 2023-08-03 DIAGNOSIS — I25119 Atherosclerotic heart disease of native coronary artery with unspecified angina pectoris: Secondary | ICD-10-CM | POA: Diagnosis not present

## 2023-08-03 DIAGNOSIS — Z9181 History of falling: Secondary | ICD-10-CM | POA: Diagnosis not present

## 2023-08-06 DIAGNOSIS — I25119 Atherosclerotic heart disease of native coronary artery with unspecified angina pectoris: Secondary | ICD-10-CM | POA: Diagnosis not present

## 2023-08-06 DIAGNOSIS — E782 Mixed hyperlipidemia: Secondary | ICD-10-CM | POA: Diagnosis not present

## 2023-08-06 DIAGNOSIS — Z7982 Long term (current) use of aspirin: Secondary | ICD-10-CM | POA: Diagnosis not present

## 2023-08-06 DIAGNOSIS — I130 Hypertensive heart and chronic kidney disease with heart failure and stage 1 through stage 4 chronic kidney disease, or unspecified chronic kidney disease: Secondary | ICD-10-CM | POA: Diagnosis not present

## 2023-08-06 DIAGNOSIS — Z9181 History of falling: Secondary | ICD-10-CM | POA: Diagnosis not present

## 2023-08-06 DIAGNOSIS — M069 Rheumatoid arthritis, unspecified: Secondary | ICD-10-CM | POA: Diagnosis not present

## 2023-08-06 DIAGNOSIS — J9601 Acute respiratory failure with hypoxia: Secondary | ICD-10-CM | POA: Diagnosis not present

## 2023-08-06 DIAGNOSIS — I5033 Acute on chronic diastolic (congestive) heart failure: Secondary | ICD-10-CM | POA: Diagnosis not present

## 2023-08-06 DIAGNOSIS — J449 Chronic obstructive pulmonary disease, unspecified: Secondary | ICD-10-CM | POA: Diagnosis not present

## 2023-08-06 DIAGNOSIS — I272 Pulmonary hypertension, unspecified: Secondary | ICD-10-CM | POA: Diagnosis not present

## 2023-08-06 DIAGNOSIS — N179 Acute kidney failure, unspecified: Secondary | ICD-10-CM | POA: Diagnosis not present

## 2023-08-06 DIAGNOSIS — R911 Solitary pulmonary nodule: Secondary | ICD-10-CM | POA: Diagnosis not present

## 2023-08-06 DIAGNOSIS — N1832 Chronic kidney disease, stage 3b: Secondary | ICD-10-CM | POA: Diagnosis not present

## 2023-08-08 DIAGNOSIS — Z7982 Long term (current) use of aspirin: Secondary | ICD-10-CM | POA: Diagnosis not present

## 2023-08-08 DIAGNOSIS — I130 Hypertensive heart and chronic kidney disease with heart failure and stage 1 through stage 4 chronic kidney disease, or unspecified chronic kidney disease: Secondary | ICD-10-CM | POA: Diagnosis not present

## 2023-08-08 DIAGNOSIS — J9601 Acute respiratory failure with hypoxia: Secondary | ICD-10-CM | POA: Diagnosis not present

## 2023-08-08 DIAGNOSIS — N179 Acute kidney failure, unspecified: Secondary | ICD-10-CM | POA: Diagnosis not present

## 2023-08-08 DIAGNOSIS — I272 Pulmonary hypertension, unspecified: Secondary | ICD-10-CM | POA: Diagnosis not present

## 2023-08-08 DIAGNOSIS — N1832 Chronic kidney disease, stage 3b: Secondary | ICD-10-CM | POA: Diagnosis not present

## 2023-08-08 DIAGNOSIS — J449 Chronic obstructive pulmonary disease, unspecified: Secondary | ICD-10-CM | POA: Diagnosis not present

## 2023-08-08 DIAGNOSIS — I25119 Atherosclerotic heart disease of native coronary artery with unspecified angina pectoris: Secondary | ICD-10-CM | POA: Diagnosis not present

## 2023-08-08 DIAGNOSIS — M069 Rheumatoid arthritis, unspecified: Secondary | ICD-10-CM | POA: Diagnosis not present

## 2023-08-08 DIAGNOSIS — I5033 Acute on chronic diastolic (congestive) heart failure: Secondary | ICD-10-CM | POA: Diagnosis not present

## 2023-08-08 DIAGNOSIS — E782 Mixed hyperlipidemia: Secondary | ICD-10-CM | POA: Diagnosis not present

## 2023-08-08 DIAGNOSIS — R911 Solitary pulmonary nodule: Secondary | ICD-10-CM | POA: Diagnosis not present

## 2023-08-08 DIAGNOSIS — Z9181 History of falling: Secondary | ICD-10-CM | POA: Diagnosis not present

## 2023-08-10 ENCOUNTER — Ambulatory Visit (HOSPITAL_COMMUNITY)
Admission: RE | Admit: 2023-08-10 | Discharge: 2023-08-10 | Disposition: A | Discharge status: Home / Self Care | Source: Ambulatory Visit | Attending: Internal Medicine

## 2023-08-10 DIAGNOSIS — I5022 Chronic systolic (congestive) heart failure: Secondary | ICD-10-CM | POA: Diagnosis not present

## 2023-08-10 LAB — BASIC METABOLIC PANEL WITH GFR
Anion gap: 12 (ref 5–15)
BUN: 25 mg/dL — ABNORMAL HIGH (ref 8–23)
CO2: 24 mmol/L (ref 22–32)
Calcium: 9.8 mg/dL (ref 8.9–10.3)
Chloride: 102 mmol/L (ref 98–111)
Creatinine, Ser: 1.48 mg/dL — ABNORMAL HIGH (ref 0.44–1.00)
GFR, Estimated: 37 mL/min — ABNORMAL LOW (ref >60)
Glucose, Bld: 94 mg/dL (ref 70–99)
Potassium: 4.5 mmol/L (ref 3.5–5.1)
Sodium: 138 mmol/L (ref 135–145)

## 2023-08-14 DIAGNOSIS — I130 Hypertensive heart and chronic kidney disease with heart failure and stage 1 through stage 4 chronic kidney disease, or unspecified chronic kidney disease: Secondary | ICD-10-CM | POA: Diagnosis not present

## 2023-08-14 DIAGNOSIS — N1832 Chronic kidney disease, stage 3b: Secondary | ICD-10-CM | POA: Diagnosis not present

## 2023-08-14 DIAGNOSIS — N179 Acute kidney failure, unspecified: Secondary | ICD-10-CM | POA: Diagnosis not present

## 2023-08-14 DIAGNOSIS — I129 Hypertensive chronic kidney disease with stage 1 through stage 4 chronic kidney disease, or unspecified chronic kidney disease: Secondary | ICD-10-CM | POA: Diagnosis not present

## 2023-08-14 DIAGNOSIS — I272 Pulmonary hypertension, unspecified: Secondary | ICD-10-CM | POA: Diagnosis not present

## 2023-08-14 DIAGNOSIS — N2581 Secondary hyperparathyroidism of renal origin: Secondary | ICD-10-CM | POA: Diagnosis not present

## 2023-08-14 DIAGNOSIS — I5033 Acute on chronic diastolic (congestive) heart failure: Secondary | ICD-10-CM | POA: Diagnosis not present

## 2023-08-14 DIAGNOSIS — R911 Solitary pulmonary nodule: Secondary | ICD-10-CM | POA: Diagnosis not present

## 2023-08-14 DIAGNOSIS — M069 Rheumatoid arthritis, unspecified: Secondary | ICD-10-CM | POA: Diagnosis not present

## 2023-08-14 DIAGNOSIS — I25119 Atherosclerotic heart disease of native coronary artery with unspecified angina pectoris: Secondary | ICD-10-CM | POA: Diagnosis not present

## 2023-08-14 DIAGNOSIS — I2510 Atherosclerotic heart disease of native coronary artery without angina pectoris: Secondary | ICD-10-CM | POA: Diagnosis not present

## 2023-08-14 DIAGNOSIS — Z7982 Long term (current) use of aspirin: Secondary | ICD-10-CM | POA: Diagnosis not present

## 2023-08-14 DIAGNOSIS — I5032 Chronic diastolic (congestive) heart failure: Secondary | ICD-10-CM | POA: Diagnosis not present

## 2023-08-14 DIAGNOSIS — E782 Mixed hyperlipidemia: Secondary | ICD-10-CM | POA: Diagnosis not present

## 2023-08-14 DIAGNOSIS — J9601 Acute respiratory failure with hypoxia: Secondary | ICD-10-CM | POA: Diagnosis not present

## 2023-08-14 DIAGNOSIS — J449 Chronic obstructive pulmonary disease, unspecified: Secondary | ICD-10-CM | POA: Diagnosis not present

## 2023-08-14 DIAGNOSIS — Z9181 History of falling: Secondary | ICD-10-CM | POA: Diagnosis not present

## 2023-08-15 LAB — LAB REPORT - SCANNED: Creatinine, POC: 82.9 mg/dL

## 2023-08-20 DIAGNOSIS — I5033 Acute on chronic diastolic (congestive) heart failure: Secondary | ICD-10-CM | POA: Diagnosis not present

## 2023-08-20 DIAGNOSIS — N1832 Chronic kidney disease, stage 3b: Secondary | ICD-10-CM | POA: Diagnosis not present

## 2023-08-20 DIAGNOSIS — N179 Acute kidney failure, unspecified: Secondary | ICD-10-CM | POA: Diagnosis not present

## 2023-08-20 DIAGNOSIS — R911 Solitary pulmonary nodule: Secondary | ICD-10-CM | POA: Diagnosis not present

## 2023-08-20 DIAGNOSIS — J9601 Acute respiratory failure with hypoxia: Secondary | ICD-10-CM | POA: Diagnosis not present

## 2023-08-20 DIAGNOSIS — J449 Chronic obstructive pulmonary disease, unspecified: Secondary | ICD-10-CM | POA: Diagnosis not present

## 2023-08-20 DIAGNOSIS — Z7982 Long term (current) use of aspirin: Secondary | ICD-10-CM | POA: Diagnosis not present

## 2023-08-20 DIAGNOSIS — M069 Rheumatoid arthritis, unspecified: Secondary | ICD-10-CM | POA: Diagnosis not present

## 2023-08-20 DIAGNOSIS — I25119 Atherosclerotic heart disease of native coronary artery with unspecified angina pectoris: Secondary | ICD-10-CM | POA: Diagnosis not present

## 2023-08-20 DIAGNOSIS — I272 Pulmonary hypertension, unspecified: Secondary | ICD-10-CM | POA: Diagnosis not present

## 2023-08-20 DIAGNOSIS — I130 Hypertensive heart and chronic kidney disease with heart failure and stage 1 through stage 4 chronic kidney disease, or unspecified chronic kidney disease: Secondary | ICD-10-CM | POA: Diagnosis not present

## 2023-08-20 DIAGNOSIS — Z9181 History of falling: Secondary | ICD-10-CM | POA: Diagnosis not present

## 2023-08-20 DIAGNOSIS — E782 Mixed hyperlipidemia: Secondary | ICD-10-CM | POA: Diagnosis not present

## 2023-08-24 DIAGNOSIS — E782 Mixed hyperlipidemia: Secondary | ICD-10-CM | POA: Diagnosis not present

## 2023-08-24 DIAGNOSIS — M069 Rheumatoid arthritis, unspecified: Secondary | ICD-10-CM | POA: Diagnosis not present

## 2023-08-24 DIAGNOSIS — I25119 Atherosclerotic heart disease of native coronary artery with unspecified angina pectoris: Secondary | ICD-10-CM | POA: Diagnosis not present

## 2023-08-24 DIAGNOSIS — I272 Pulmonary hypertension, unspecified: Secondary | ICD-10-CM | POA: Diagnosis not present

## 2023-08-24 DIAGNOSIS — N179 Acute kidney failure, unspecified: Secondary | ICD-10-CM | POA: Diagnosis not present

## 2023-08-24 DIAGNOSIS — R911 Solitary pulmonary nodule: Secondary | ICD-10-CM | POA: Diagnosis not present

## 2023-08-24 DIAGNOSIS — N1832 Chronic kidney disease, stage 3b: Secondary | ICD-10-CM | POA: Diagnosis not present

## 2023-08-24 DIAGNOSIS — Z7982 Long term (current) use of aspirin: Secondary | ICD-10-CM | POA: Diagnosis not present

## 2023-08-24 DIAGNOSIS — J9601 Acute respiratory failure with hypoxia: Secondary | ICD-10-CM | POA: Diagnosis not present

## 2023-08-24 DIAGNOSIS — Z9181 History of falling: Secondary | ICD-10-CM | POA: Diagnosis not present

## 2023-08-24 DIAGNOSIS — J449 Chronic obstructive pulmonary disease, unspecified: Secondary | ICD-10-CM | POA: Diagnosis not present

## 2023-08-24 DIAGNOSIS — I5033 Acute on chronic diastolic (congestive) heart failure: Secondary | ICD-10-CM | POA: Diagnosis not present

## 2023-08-24 DIAGNOSIS — I130 Hypertensive heart and chronic kidney disease with heart failure and stage 1 through stage 4 chronic kidney disease, or unspecified chronic kidney disease: Secondary | ICD-10-CM | POA: Diagnosis not present

## 2023-08-29 DIAGNOSIS — M069 Rheumatoid arthritis, unspecified: Secondary | ICD-10-CM | POA: Diagnosis not present

## 2023-08-29 DIAGNOSIS — I272 Pulmonary hypertension, unspecified: Secondary | ICD-10-CM | POA: Diagnosis not present

## 2023-08-29 DIAGNOSIS — I5033 Acute on chronic diastolic (congestive) heart failure: Secondary | ICD-10-CM | POA: Diagnosis not present

## 2023-08-29 DIAGNOSIS — E782 Mixed hyperlipidemia: Secondary | ICD-10-CM | POA: Diagnosis not present

## 2023-08-29 DIAGNOSIS — Z9181 History of falling: Secondary | ICD-10-CM | POA: Diagnosis not present

## 2023-08-29 DIAGNOSIS — J9601 Acute respiratory failure with hypoxia: Secondary | ICD-10-CM | POA: Diagnosis not present

## 2023-08-29 DIAGNOSIS — I25119 Atherosclerotic heart disease of native coronary artery with unspecified angina pectoris: Secondary | ICD-10-CM | POA: Diagnosis not present

## 2023-08-29 DIAGNOSIS — R911 Solitary pulmonary nodule: Secondary | ICD-10-CM | POA: Diagnosis not present

## 2023-08-29 DIAGNOSIS — J449 Chronic obstructive pulmonary disease, unspecified: Secondary | ICD-10-CM | POA: Diagnosis not present

## 2023-08-29 DIAGNOSIS — Z7982 Long term (current) use of aspirin: Secondary | ICD-10-CM | POA: Diagnosis not present

## 2023-08-29 DIAGNOSIS — N179 Acute kidney failure, unspecified: Secondary | ICD-10-CM | POA: Diagnosis not present

## 2023-08-29 DIAGNOSIS — I130 Hypertensive heart and chronic kidney disease with heart failure and stage 1 through stage 4 chronic kidney disease, or unspecified chronic kidney disease: Secondary | ICD-10-CM | POA: Diagnosis not present

## 2023-08-29 DIAGNOSIS — N1832 Chronic kidney disease, stage 3b: Secondary | ICD-10-CM | POA: Diagnosis not present

## 2023-08-31 DIAGNOSIS — R911 Solitary pulmonary nodule: Secondary | ICD-10-CM | POA: Diagnosis not present

## 2023-08-31 DIAGNOSIS — I25119 Atherosclerotic heart disease of native coronary artery with unspecified angina pectoris: Secondary | ICD-10-CM | POA: Diagnosis not present

## 2023-08-31 DIAGNOSIS — Z9181 History of falling: Secondary | ICD-10-CM | POA: Diagnosis not present

## 2023-08-31 DIAGNOSIS — I130 Hypertensive heart and chronic kidney disease with heart failure and stage 1 through stage 4 chronic kidney disease, or unspecified chronic kidney disease: Secondary | ICD-10-CM | POA: Diagnosis not present

## 2023-08-31 DIAGNOSIS — N1832 Chronic kidney disease, stage 3b: Secondary | ICD-10-CM | POA: Diagnosis not present

## 2023-08-31 DIAGNOSIS — J449 Chronic obstructive pulmonary disease, unspecified: Secondary | ICD-10-CM | POA: Diagnosis not present

## 2023-08-31 DIAGNOSIS — M069 Rheumatoid arthritis, unspecified: Secondary | ICD-10-CM | POA: Diagnosis not present

## 2023-08-31 DIAGNOSIS — Z7982 Long term (current) use of aspirin: Secondary | ICD-10-CM | POA: Diagnosis not present

## 2023-08-31 DIAGNOSIS — J9601 Acute respiratory failure with hypoxia: Secondary | ICD-10-CM | POA: Diagnosis not present

## 2023-08-31 DIAGNOSIS — I5033 Acute on chronic diastolic (congestive) heart failure: Secondary | ICD-10-CM | POA: Diagnosis not present

## 2023-08-31 DIAGNOSIS — I272 Pulmonary hypertension, unspecified: Secondary | ICD-10-CM | POA: Diagnosis not present

## 2023-08-31 DIAGNOSIS — E782 Mixed hyperlipidemia: Secondary | ICD-10-CM | POA: Diagnosis not present

## 2023-08-31 DIAGNOSIS — N179 Acute kidney failure, unspecified: Secondary | ICD-10-CM | POA: Diagnosis not present

## 2023-09-04 ENCOUNTER — Ambulatory Visit (HOSPITAL_BASED_OUTPATIENT_CLINIC_OR_DEPARTMENT_OTHER): Admitting: Pulmonary Disease

## 2023-09-04 DIAGNOSIS — N1832 Chronic kidney disease, stage 3b: Secondary | ICD-10-CM | POA: Diagnosis not present

## 2023-09-05 DIAGNOSIS — Z9181 History of falling: Secondary | ICD-10-CM | POA: Diagnosis not present

## 2023-09-05 DIAGNOSIS — J9601 Acute respiratory failure with hypoxia: Secondary | ICD-10-CM | POA: Diagnosis not present

## 2023-09-05 DIAGNOSIS — J449 Chronic obstructive pulmonary disease, unspecified: Secondary | ICD-10-CM | POA: Diagnosis not present

## 2023-09-05 DIAGNOSIS — Z7982 Long term (current) use of aspirin: Secondary | ICD-10-CM | POA: Diagnosis not present

## 2023-09-05 DIAGNOSIS — I130 Hypertensive heart and chronic kidney disease with heart failure and stage 1 through stage 4 chronic kidney disease, or unspecified chronic kidney disease: Secondary | ICD-10-CM | POA: Diagnosis not present

## 2023-09-05 DIAGNOSIS — I25119 Atherosclerotic heart disease of native coronary artery with unspecified angina pectoris: Secondary | ICD-10-CM | POA: Diagnosis not present

## 2023-09-05 DIAGNOSIS — N179 Acute kidney failure, unspecified: Secondary | ICD-10-CM | POA: Diagnosis not present

## 2023-09-05 DIAGNOSIS — M069 Rheumatoid arthritis, unspecified: Secondary | ICD-10-CM | POA: Diagnosis not present

## 2023-09-05 DIAGNOSIS — I5033 Acute on chronic diastolic (congestive) heart failure: Secondary | ICD-10-CM | POA: Diagnosis not present

## 2023-09-05 DIAGNOSIS — E782 Mixed hyperlipidemia: Secondary | ICD-10-CM | POA: Diagnosis not present

## 2023-09-05 DIAGNOSIS — I272 Pulmonary hypertension, unspecified: Secondary | ICD-10-CM | POA: Diagnosis not present

## 2023-09-05 DIAGNOSIS — R911 Solitary pulmonary nodule: Secondary | ICD-10-CM | POA: Diagnosis not present

## 2023-09-05 DIAGNOSIS — N1832 Chronic kidney disease, stage 3b: Secondary | ICD-10-CM | POA: Diagnosis not present

## 2023-09-06 DIAGNOSIS — H35033 Hypertensive retinopathy, bilateral: Secondary | ICD-10-CM | POA: Diagnosis not present

## 2023-09-06 DIAGNOSIS — H40053 Ocular hypertension, bilateral: Secondary | ICD-10-CM | POA: Diagnosis not present

## 2023-09-06 DIAGNOSIS — H26493 Other secondary cataract, bilateral: Secondary | ICD-10-CM | POA: Diagnosis not present

## 2023-09-06 DIAGNOSIS — H43813 Vitreous degeneration, bilateral: Secondary | ICD-10-CM | POA: Diagnosis not present

## 2023-09-06 DIAGNOSIS — H524 Presbyopia: Secondary | ICD-10-CM | POA: Diagnosis not present

## 2023-09-07 DIAGNOSIS — I5033 Acute on chronic diastolic (congestive) heart failure: Secondary | ICD-10-CM | POA: Diagnosis not present

## 2023-09-07 DIAGNOSIS — I25119 Atherosclerotic heart disease of native coronary artery with unspecified angina pectoris: Secondary | ICD-10-CM | POA: Diagnosis not present

## 2023-09-07 DIAGNOSIS — R911 Solitary pulmonary nodule: Secondary | ICD-10-CM | POA: Diagnosis not present

## 2023-09-07 DIAGNOSIS — Z7982 Long term (current) use of aspirin: Secondary | ICD-10-CM | POA: Diagnosis not present

## 2023-09-07 DIAGNOSIS — J449 Chronic obstructive pulmonary disease, unspecified: Secondary | ICD-10-CM | POA: Diagnosis not present

## 2023-09-07 DIAGNOSIS — I130 Hypertensive heart and chronic kidney disease with heart failure and stage 1 through stage 4 chronic kidney disease, or unspecified chronic kidney disease: Secondary | ICD-10-CM | POA: Diagnosis not present

## 2023-09-07 DIAGNOSIS — I272 Pulmonary hypertension, unspecified: Secondary | ICD-10-CM | POA: Diagnosis not present

## 2023-09-07 DIAGNOSIS — M069 Rheumatoid arthritis, unspecified: Secondary | ICD-10-CM | POA: Diagnosis not present

## 2023-09-07 DIAGNOSIS — N1832 Chronic kidney disease, stage 3b: Secondary | ICD-10-CM | POA: Diagnosis not present

## 2023-09-07 DIAGNOSIS — E782 Mixed hyperlipidemia: Secondary | ICD-10-CM | POA: Diagnosis not present

## 2023-09-07 DIAGNOSIS — J9601 Acute respiratory failure with hypoxia: Secondary | ICD-10-CM | POA: Diagnosis not present

## 2023-09-07 DIAGNOSIS — N179 Acute kidney failure, unspecified: Secondary | ICD-10-CM | POA: Diagnosis not present

## 2023-09-07 DIAGNOSIS — Z9181 History of falling: Secondary | ICD-10-CM | POA: Diagnosis not present

## 2023-09-10 DIAGNOSIS — R911 Solitary pulmonary nodule: Secondary | ICD-10-CM | POA: Diagnosis not present

## 2023-09-10 DIAGNOSIS — I5033 Acute on chronic diastolic (congestive) heart failure: Secondary | ICD-10-CM | POA: Diagnosis not present

## 2023-09-10 DIAGNOSIS — J449 Chronic obstructive pulmonary disease, unspecified: Secondary | ICD-10-CM | POA: Diagnosis not present

## 2023-09-10 DIAGNOSIS — Z7982 Long term (current) use of aspirin: Secondary | ICD-10-CM | POA: Diagnosis not present

## 2023-09-10 DIAGNOSIS — N1832 Chronic kidney disease, stage 3b: Secondary | ICD-10-CM | POA: Diagnosis not present

## 2023-09-10 DIAGNOSIS — E782 Mixed hyperlipidemia: Secondary | ICD-10-CM | POA: Diagnosis not present

## 2023-09-10 DIAGNOSIS — J9601 Acute respiratory failure with hypoxia: Secondary | ICD-10-CM | POA: Diagnosis not present

## 2023-09-10 DIAGNOSIS — I272 Pulmonary hypertension, unspecified: Secondary | ICD-10-CM | POA: Diagnosis not present

## 2023-09-10 DIAGNOSIS — I130 Hypertensive heart and chronic kidney disease with heart failure and stage 1 through stage 4 chronic kidney disease, or unspecified chronic kidney disease: Secondary | ICD-10-CM | POA: Diagnosis not present

## 2023-09-10 DIAGNOSIS — Z9181 History of falling: Secondary | ICD-10-CM | POA: Diagnosis not present

## 2023-09-10 DIAGNOSIS — M069 Rheumatoid arthritis, unspecified: Secondary | ICD-10-CM | POA: Diagnosis not present

## 2023-09-10 DIAGNOSIS — N179 Acute kidney failure, unspecified: Secondary | ICD-10-CM | POA: Diagnosis not present

## 2023-09-10 DIAGNOSIS — I25119 Atherosclerotic heart disease of native coronary artery with unspecified angina pectoris: Secondary | ICD-10-CM | POA: Diagnosis not present

## 2023-09-13 ENCOUNTER — Encounter: Payer: Self-pay | Admitting: Cardiovascular Disease

## 2023-09-14 ENCOUNTER — Other Ambulatory Visit: Payer: Self-pay | Admitting: Cardiovascular Disease

## 2023-09-14 NOTE — Telephone Encounter (Signed)
*  STAT* If patient is at the pharmacy, call can be transferred to refill team.   1. Which medications need to be refilled? (please list name of each medication and dose if known) mexiletine (MEXITIL ) 150 MG capsule   spironolactone  (ALDACTONE ) 25 MG tablet   2. Which pharmacy/location (including street and city if local pharmacy) is medication to be sent to? [id number] - [place], [place] - [address]   3. Do they need a 30 day or 90 day supply? 90  Patient has appt on 6/18

## 2023-09-14 NOTE — Telephone Encounter (Signed)
 Pt is requesting a refill on medications mexiletine and spironolactone . These medications were prescribed in the hospital. Would Dr. Abel Hoe like to refill these medications? Please address

## 2023-09-14 NOTE — Telephone Encounter (Signed)
 She was seen in office in March but after that went to the hospital for CHF, Cath'd by Dr. Arlester Ladd.  Discharged on 4/4 w CHF TOC appointment on 08/01/23.    Now has follow up with D. Dunn on 09/19/23.

## 2023-09-16 ENCOUNTER — Other Ambulatory Visit: Payer: Self-pay | Admitting: Family Medicine

## 2023-09-17 DIAGNOSIS — R338 Other retention of urine: Secondary | ICD-10-CM | POA: Diagnosis not present

## 2023-09-19 ENCOUNTER — Other Ambulatory Visit: Payer: Self-pay | Admitting: Family Medicine

## 2023-09-19 ENCOUNTER — Ambulatory Visit: Admitting: Physician Assistant

## 2023-09-20 ENCOUNTER — Other Ambulatory Visit: Payer: Self-pay | Admitting: Family Medicine

## 2023-09-20 DIAGNOSIS — R911 Solitary pulmonary nodule: Secondary | ICD-10-CM | POA: Diagnosis not present

## 2023-09-20 DIAGNOSIS — I272 Pulmonary hypertension, unspecified: Secondary | ICD-10-CM | POA: Diagnosis not present

## 2023-09-20 DIAGNOSIS — N179 Acute kidney failure, unspecified: Secondary | ICD-10-CM | POA: Diagnosis not present

## 2023-09-20 DIAGNOSIS — Z9181 History of falling: Secondary | ICD-10-CM | POA: Diagnosis not present

## 2023-09-20 DIAGNOSIS — J449 Chronic obstructive pulmonary disease, unspecified: Secondary | ICD-10-CM | POA: Diagnosis not present

## 2023-09-20 DIAGNOSIS — I5033 Acute on chronic diastolic (congestive) heart failure: Secondary | ICD-10-CM | POA: Diagnosis not present

## 2023-09-20 DIAGNOSIS — I25119 Atherosclerotic heart disease of native coronary artery with unspecified angina pectoris: Secondary | ICD-10-CM | POA: Diagnosis not present

## 2023-09-20 DIAGNOSIS — I130 Hypertensive heart and chronic kidney disease with heart failure and stage 1 through stage 4 chronic kidney disease, or unspecified chronic kidney disease: Secondary | ICD-10-CM | POA: Diagnosis not present

## 2023-09-20 DIAGNOSIS — J9601 Acute respiratory failure with hypoxia: Secondary | ICD-10-CM | POA: Diagnosis not present

## 2023-09-20 DIAGNOSIS — E782 Mixed hyperlipidemia: Secondary | ICD-10-CM | POA: Diagnosis not present

## 2023-09-20 DIAGNOSIS — Z7982 Long term (current) use of aspirin: Secondary | ICD-10-CM | POA: Diagnosis not present

## 2023-09-20 DIAGNOSIS — M069 Rheumatoid arthritis, unspecified: Secondary | ICD-10-CM | POA: Diagnosis not present

## 2023-09-20 DIAGNOSIS — N1832 Chronic kidney disease, stage 3b: Secondary | ICD-10-CM | POA: Diagnosis not present

## 2023-09-21 ENCOUNTER — Telehealth: Payer: Self-pay

## 2023-09-21 NOTE — Telephone Encounter (Signed)
 Copied from CRM [id number]. Topic: General - Other >> Sep 20, 2023  3:12 PM Dorisann Garre T wrote: Reason for CRM: gracie from [facility] is calling in requesting verbal orders for home health pt for 1 time a week for 9 weeks patient is needing a  Recertification     for pt cb number is  [phone]

## 2023-09-24 ENCOUNTER — Telehealth: Payer: Self-pay | Admitting: Cardiovascular Disease

## 2023-09-24 MED ORDER — MEXILETINE HCL 150 MG PO CAPS
150.0000 mg | ORAL_CAPSULE | Freq: Two times a day (BID) | ORAL | 2 refills | Status: DC
Start: 2023-09-24 — End: 2023-12-06

## 2023-09-24 MED ORDER — SPIRONOLACTONE 25 MG PO TABS: 25.0000 mg | ORAL_TABLET | Freq: Every day | ORAL | 2 refills | Status: AC

## 2023-09-24 NOTE — Telephone Encounter (Signed)
 Please okay these orders  ?

## 2023-09-24 NOTE — Telephone Encounter (Signed)
 Left detailed message for [name] [name] regarding approved VO advised to call th office with any questions

## 2023-09-24 NOTE — Telephone Encounter (Signed)
*  STAT* If patient is at the pharmacy, call can be transferred to refill team.   1. Which medications need to be refilled? (please list name of each medication and dose if known) new prescriptions for Spironolactone  and Mexiletine   2. Would you like to learn more about the convenience, safety, & potential cost savings by using the [facility]?     3. Are you open to using the [facility] (Type [facility].  4. Which pharmacy/location (including street and city if local pharmacy) is medication to be sent to?[facility] [address] [place]   5. Do they need a 30 day or 90 day supply? Need enough her appointment on 10-03-23

## 2023-09-24 NOTE — Telephone Encounter (Signed)
 RX sent to requested Pharmacy

## 2023-09-25 NOTE — Telephone Encounter (Signed)
 Spoke with Gracie advised of approved VO

## 2023-09-27 DIAGNOSIS — I130 Hypertensive heart and chronic kidney disease with heart failure and stage 1 through stage 4 chronic kidney disease, or unspecified chronic kidney disease: Secondary | ICD-10-CM | POA: Diagnosis not present

## 2023-09-27 DIAGNOSIS — Z9181 History of falling: Secondary | ICD-10-CM | POA: Diagnosis not present

## 2023-09-27 DIAGNOSIS — N1832 Chronic kidney disease, stage 3b: Secondary | ICD-10-CM | POA: Diagnosis not present

## 2023-09-27 DIAGNOSIS — I272 Pulmonary hypertension, unspecified: Secondary | ICD-10-CM | POA: Diagnosis not present

## 2023-09-27 DIAGNOSIS — J449 Chronic obstructive pulmonary disease, unspecified: Secondary | ICD-10-CM | POA: Diagnosis not present

## 2023-09-27 DIAGNOSIS — Z7982 Long term (current) use of aspirin: Secondary | ICD-10-CM | POA: Diagnosis not present

## 2023-09-27 DIAGNOSIS — M069 Rheumatoid arthritis, unspecified: Secondary | ICD-10-CM | POA: Diagnosis not present

## 2023-09-27 DIAGNOSIS — I5033 Acute on chronic diastolic (congestive) heart failure: Secondary | ICD-10-CM | POA: Diagnosis not present

## 2023-09-27 DIAGNOSIS — E782 Mixed hyperlipidemia: Secondary | ICD-10-CM | POA: Diagnosis not present

## 2023-09-27 DIAGNOSIS — N179 Acute kidney failure, unspecified: Secondary | ICD-10-CM | POA: Diagnosis not present

## 2023-09-27 DIAGNOSIS — I25119 Atherosclerotic heart disease of native coronary artery with unspecified angina pectoris: Secondary | ICD-10-CM | POA: Diagnosis not present

## 2023-09-27 DIAGNOSIS — R911 Solitary pulmonary nodule: Secondary | ICD-10-CM | POA: Diagnosis not present

## 2023-09-27 DIAGNOSIS — J9601 Acute respiratory failure with hypoxia: Secondary | ICD-10-CM | POA: Diagnosis not present

## 2023-10-01 DIAGNOSIS — H26493 Other secondary cataract, bilateral: Secondary | ICD-10-CM | POA: Diagnosis not present

## 2023-10-01 DIAGNOSIS — H26492 Other secondary cataract, left eye: Secondary | ICD-10-CM | POA: Diagnosis not present

## 2023-10-01 DIAGNOSIS — I10 Essential (primary) hypertension: Secondary | ICD-10-CM | POA: Diagnosis not present

## 2023-10-02 NOTE — Progress Notes (Unsigned)
 Cardiology Office Note    Date:  10/03/2023  ID:  [name] [name], DOB Sep 12, 1950, MRN [id number] PCP:  [name] [name] LABOR, MD  Cardiologist:  Lonni Cash, MD  Electrophysiologist:  Remotely Dr. Kelsie; Dr. Cindie saw inpatient 06/2023  Chief Complaint: f/u CHF  History of Present Illness: .    [name] [name] is a 73 y.o. 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    She remotely established care in 2015 for dyspnea/weakness. Echo June 2015 showed normal LV size and function, possible bicuspid aortic valve (later found to be tricuspid), mildly dilated aortic root. Due to shadow in the aorta, a CTA chest was arranged which excluded dissection, showed mild aortic prominence. Monitor had shown sinus rhythm with PVCs with bigeminy so propranolol  was changed to metoprolol . She was later started on flecainide . She had a home sleep study in March 2018 and she was told it was normal. She has had periodic cardiac catheterizations with moderate nonobstructive CAD. She was admitted 3/17-07/06/23 with dyspnea and hypoxia requiring BiPAP. This was felt due to acute on chronic HFpEF. 2D echo showed EF 60-65%, G1DD, interventricular septum is flattened in systole and diastole, consistent with right ventricular pressure and volume overload, mildly reduced RV function, aortic sclerosis. CTA neg for PE, + small pleural effusions, scattered pulmonary nodules, 4cm ascending TAA, coronary calcifications. With diuresis she developed AKI and hypotension so required fluid back. Cardiac cath 06/22/23 showed 50-60% mLAD, moderate pHTN, preserved cardiac output, recommended for medical therapy. Flecainide  was transitioned to mexiletine by EP given CAD. She saw HF team in  follow-up 4/30 who suspected residual edema was due to to amlodipine . She was not felt to be a candidate for SGLT2i with urinary retention. She saw urology and thankfully by that time the urinary retention had improved.  She returns for follow-up overall stable from cardiac standpoint. Since last OV, she reports her kidney doctor d/c'd amlodipine  and increased Lasix  to 40mg  daily. She has mild edema on exam which she reports is significantly improved from earlier this year. Denies chest pain. Continues to have dyspnea with exertion without recent acceleration.  Labwork independently reviewed: 08/2023 K 4.5, Cr 1.48 07/2023 BNP 18.6 06/2023 Mg 2.2, Tn 97-104, H/H/Plt OK, alb 3.5, AST ALT OK 05/2023 LDL 119, trig 188  ROS: .    Please see the history of present illness.  All other systems are reviewed and otherwise negative.  Studies Reviewed: [name]    EKG:  EKG is ordered today, personally reviewed, demonstrating   EKG Interpretation Date/Time:  Wednesday October 03 2023 13:54:54 EDT Ventricular Rate:  73 PR Interval:    QRS Duration:  118 QT Interval:  342 QTC Calculation: 376 R Axis:   99  Text Interpretation: Normal sinus rhythm with frequent PVCs Rightward axis Non-specific intra-ventricular conduction delay Nonspecific ST abnormality Confirmed by Bob Daversa [phone]) on 10/03/2023 2:02:35 PM    CV Studies: Cardiac studies reviewed are outlined and summarized above. Otherwise please see EMR for full report.   Current Reported Medications:.    Current Meds  Medication Sig   albuterol  (PROAIR  HFA) 108 (90 Base) MCG/ACT inhaler Inhale 2 puffs into the lungs every 6 (six) hours as needed for wheezing.   ALPRAZolam  (XANAX ) 0.25 MG tablet Take 1 tablet (0.25  mg total) by mouth 3 (three) times daily as needed for anxiety. TAKE 3 TABLETS BY MOUTH EVERY 6 HOURS AS NEEDED   ARIPiprazole  (ABILIFY ) 2 MG tablet GIVE TAKE 1 TABLET BY MOUTH ONE TIME A DAY RELATED TO TO MAJOR DEPRESSIVE DISORDER.    aspirin  EC 81 MG tablet Take 1 tablet (81 mg total) by mouth daily.   atorvastatin  (LIPITOR) 20 MG tablet Take 1 tablet (20 mg total) by mouth daily.   cetirizine (ZYRTEC) 10 MG tablet Take 10 mg by mouth daily as needed for allergies.    cholecalciferol (VITAMIN D3) 25 MCG (1000 UNIT) tablet Take 1,000 Units by mouth daily.   cyclobenzaprine  (FLEXERIL ) 10 MG tablet GIVE 0.5 TABLET BY MOUTH 3 TIMES A DAY FOR MUSCLE SPASM RELIEF   furosemide  (LASIX ) 40 MG tablet Take 40 mg by mouth daily.   lidocaine  (HM LIDOCAINE  PATCH) 4 % Place 1 patch onto the skin daily.   magnesium  oxide (MAG-OX) 400 (240 Mg) MG tablet TAKE 1 TABLET BY MOUTH EVERY DAY   MELATONIN PO Take by mouth at bedtime. OTC   metoprolol  succinate (TOPROL  XL) 25 MG 24 hr tablet Take 0.5 tablets (12.5 mg total) by mouth daily.   mexiletine (MEXITIL ) 150 MG capsule Take 1 capsule (150 mg total) by mouth every 12 (twelve) hours.   nitroGLYCERIN  (NITROSTAT ) 0.4 MG SL tablet Place 1 tablet (0.4 mg total) under the tongue every 5 (five) minutes as needed for chest pain.   senna-docusate (SENOKOT-S) 8.6-50 MG tablet Take 1 tablet by mouth 2 (two) times daily.   spironolactone  (ALDACTONE ) 25 MG tablet Take 1 tablet (25 mg total) by mouth daily.   traMADol  (ULTRAM ) 50 MG tablet Take 1 tablet (50 mg total) by mouth every 6 (six) hours as needed for moderate pain (pain score 4-6).    Physical Exam:    VS:  BP 104/76   Pulse 73   Ht 5' 2 (1.575 m)   Wt 186 lb (84.4 kg)   SpO2 94%   BMI 34.02 kg/m    Wt Readings from Last 3 Encounters:  10/03/23 186 lb (84.4 kg)  08/01/23 181 lb 9.6 oz (82.4 kg)  07/31/23 182 lb (82.6 kg)    GEN: Well nourished, well developed in no acute distress NECK: No JVD; No carotid bruits CARDIAC: regularly irregular due to frequent ectopy no murmurs, rubs, gallops RESPIRATORY: Moderately diminished throughout without rales, wheezing or rhonchi  ABDOMEN: Soft, non-tender, non-distended EXTREMITIES:  Mild soft  puffy sockline edema bilaterally; No acute deformity. No residual cath site complications.  Asessement and Plan:.    1. Chronic HFpEF, moderate pHTN - volume status overall appears stable. She has some residual LE edema but she feels this is significantly improved from earlier this year. May be related to residual venous insufficiency and right heart failure as a downstream effect of pulmonary disease. She confirms she is now off amlodipine  and that nephrology increased Lasix  to 40mg  daily. She remains on spironolactone  25mg  daily today. Will update labs for lytes given continued PVCs, but otherwise do not anticipate further med changes at this time. Initial BP 104/76, recheck by me 108/76. I am thankful she has an appointment with pulmonary soon as well. I suspect COPD contributing to pHTN seen during last admission.  2. Nonobstructive CAD, HLD - stable. Continue ASA 81mg  daily. She reports at time of last lipid check she was off statin. We will recheck CMET, lipids today with direct LDL. She is a candidate for statin  titration if LDL not at goal.  3. Ascending TAA - discussed finding with patient. Next imaging would be due 06/2023. Can discuss definitive plans for this at 6 month f/u as she may have interim surveillance for her pulmonary nodules as well. Discussed aneurysm precautions with patient and outlined on AVS.  4. Frequent PVCs - continues to have frequent PVCs on EKG. Transitioned from flecainide  to mexiletine by EP during recent admission. Will refer back to EP to help manage. Not sure we have an etiology for these. I will defer to EP on whether she would benefit from more comprehensive imaging such as cardiac MRI. 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 She is currently tolerating low dose metoprolol  again.  5. COPD - she is scheduled to see pulmonology  next week. I will go ahead and order full PFTs to get the ball rolling. Will plan to share result with pulmonology when completed.     Disposition: F/u with me or Dr. Verlin in 6 months. Refer back to EP to assist with PVC management.  Signed, Jaxtin Raimondo N Ceceilia Cephus, PA-C

## 2023-10-03 ENCOUNTER — Encounter: Payer: Self-pay | Admitting: Physician Assistant

## 2023-10-03 ENCOUNTER — Other Ambulatory Visit: Payer: Self-pay

## 2023-10-03 ENCOUNTER — Ambulatory Visit: Attending: Cardiology | Admitting: Physician Assistant

## 2023-10-03 VITALS — BP 108/76 | HR 73 | Ht 62.0 in | Wt 186.0 lb

## 2023-10-03 DIAGNOSIS — J449 Chronic obstructive pulmonary disease, unspecified: Secondary | ICD-10-CM | POA: Diagnosis not present

## 2023-10-03 DIAGNOSIS — E785 Hyperlipidemia, unspecified: Secondary | ICD-10-CM

## 2023-10-03 DIAGNOSIS — I493 Ventricular premature depolarization: Secondary | ICD-10-CM

## 2023-10-03 DIAGNOSIS — I2510 Atherosclerotic heart disease of native coronary artery without angina pectoris: Secondary | ICD-10-CM | POA: Diagnosis not present

## 2023-10-03 DIAGNOSIS — I5032 Chronic diastolic (congestive) heart failure: Secondary | ICD-10-CM

## 2023-10-03 DIAGNOSIS — I7121 Aneurysm of the ascending aorta, without rupture: Secondary | ICD-10-CM

## 2023-10-03 NOTE — Patient Instructions (Addendum)
 Medication Instructions:   Your physician recommends that you continue on your current medications as directed. Please refer to the Current Medication list given to you today.   *If you need a refill on your cardiac medications before your next appointment, please call your pharmacy*    Lab Work:  PLEASE GO DOWN STAIRS  [facility]  FIRST FLOOR   ( GET OFF ELEVATORS WALK TOWARDS WAITING AREA LAB LOCATED BY PHARMACY):  CMET MAG  LIPIDS AND DIRECT LDL    If you have labs (blood work) drawn today and your tests are completely normal, you will receive your results only by: MyChart Message (if you have MyChart) OR A paper copy in the mail If you have any lab test that is abnormal or we need to change your treatment, we will call you to review the results.   Testing/Procedures: Your physician has recommended that you have a pulmonary function test. Pulmonary Function Tests are a group of tests that measure how well air moves in and out of your lungs.   Follow-Up:  At [facility], you and your health needs are our priority.  As part of our continuing mission to provide you with exceptional heart care, our providers are all part of one team.  This team includes your primary Cardiologist (physician) and Advanced Practice Providers or APPs (Physician Assistants and Nurse Practitioners) who all work together to provide you with the care you need, when you need it.   Your next appointment:   6 month(s)  VERLIN / DAYNA DUNN                                                          AND    Provider:  NEXT AVAILABLE WITH  EP / APP   Charlies Arthur, PA-C   Michael Andy Tillery, PA-C Or Daphne Barrack, NP     We recommend signing up for the patient portal called MyChart.  Sign up information is provided on this After Visit Summary.  MyChart is used to connect with patients for Virtual Visits (Telemedicine).  Patients are able to view lab/test results, encounter notes, upcoming appointments,  etc.  Non-urgent messages can be sent to your provider as well.   To learn more about what you can do with MyChart, go to [email or web address].    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 The word aneurysm refers to a bulge in an artery (blood vessel). Most people think of them in the context of an emergency, but yours was found incidentally. At this point there is nothing you need to do from a procedure standpoint, but there are some important things to keep in mind for day-to-day life.  Mainstays of therapy for aneurysms include very good blood pressure control, healthy lifestyle, and avoiding tobacco products and street drugs. Research has raised concern that antibiotics in the fluoroquinolone class could be associated with increased risk of having an aneurysm develop or tear. This includes medicines that end in floxacin, like Cipro or Levaquin. Make sure to discuss this information with other healthcare providers if you require antibiotics.  Since aneurysms can run in families, you should discuss your diagnosis with first degree relatives as they may need to be screened for this. Regular  mild-moderate physical exercise is important, but avoid heavy lifting/weight lifting over 30lbs, chopping wood, shoveling snow or digging heavy earth with a shovel. It is best to avoid activities that cause grunting or straining (medically referred to as a Valsalva maneuver). This happens when a person bears down against a closed throat to increase the strength of arm or abdominal muscles. There's often a tendency to do this when lifting heavy weights, doing sit-ups, push-ups or chin-ups, etc., but it may be harmful.  This is a finding I would expect to be monitored periodically by your cardiology team. Most unruptured thoracic aortic aneurysms cause no symptoms, so they are often found during exams for other conditions. Contact a health  care provider if you develop any discomfort in your upper back, neck, abdomen, trouble swallowing, cough or hoarseness, or unexplained weight loss. Get help right away if you develop severe pain in your upper back or abdomen that may move into your chest and arms, or any other concerning symptoms such as shortness of breath or fever.

## 2023-10-04 ENCOUNTER — Ambulatory Visit: Payer: Self-pay | Admitting: Physician Assistant

## 2023-10-04 DIAGNOSIS — I272 Pulmonary hypertension, unspecified: Secondary | ICD-10-CM | POA: Diagnosis not present

## 2023-10-04 DIAGNOSIS — E782 Mixed hyperlipidemia: Secondary | ICD-10-CM | POA: Diagnosis not present

## 2023-10-04 DIAGNOSIS — R911 Solitary pulmonary nodule: Secondary | ICD-10-CM | POA: Diagnosis not present

## 2023-10-04 DIAGNOSIS — I130 Hypertensive heart and chronic kidney disease with heart failure and stage 1 through stage 4 chronic kidney disease, or unspecified chronic kidney disease: Secondary | ICD-10-CM | POA: Diagnosis not present

## 2023-10-04 DIAGNOSIS — M069 Rheumatoid arthritis, unspecified: Secondary | ICD-10-CM | POA: Diagnosis not present

## 2023-10-04 DIAGNOSIS — N1832 Chronic kidney disease, stage 3b: Secondary | ICD-10-CM | POA: Diagnosis not present

## 2023-10-04 DIAGNOSIS — Z7982 Long term (current) use of aspirin: Secondary | ICD-10-CM | POA: Diagnosis not present

## 2023-10-04 DIAGNOSIS — N179 Acute kidney failure, unspecified: Secondary | ICD-10-CM | POA: Diagnosis not present

## 2023-10-04 DIAGNOSIS — J449 Chronic obstructive pulmonary disease, unspecified: Secondary | ICD-10-CM | POA: Diagnosis not present

## 2023-10-04 DIAGNOSIS — Z9181 History of falling: Secondary | ICD-10-CM | POA: Diagnosis not present

## 2023-10-04 DIAGNOSIS — I5033 Acute on chronic diastolic (congestive) heart failure: Secondary | ICD-10-CM | POA: Diagnosis not present

## 2023-10-04 DIAGNOSIS — J9601 Acute respiratory failure with hypoxia: Secondary | ICD-10-CM | POA: Diagnosis not present

## 2023-10-04 DIAGNOSIS — I25119 Atherosclerotic heart disease of native coronary artery with unspecified angina pectoris: Secondary | ICD-10-CM | POA: Diagnosis not present

## 2023-10-04 LAB — COMPREHENSIVE METABOLIC PANEL WITH GFR
ALT: 10 IU/L (ref 0–32)
AST: 19 IU/L (ref 0–40)
Albumin: 4.4 g/dL (ref 3.8–4.8)
Alkaline Phosphatase: 74 IU/L (ref 44–121)
BUN/Creatinine Ratio: 20 (ref 12–28)
BUN: 30 mg/dL — ABNORMAL HIGH (ref 8–27)
Bilirubin Total: 0.5 mg/dL (ref 0.0–1.2)
CO2: 23 mmol/L (ref 20–29)
Calcium: 9.4 mg/dL (ref 8.7–10.3)
Chloride: 99 mmol/L (ref 96–106)
Creatinine, Ser: 1.5 mg/dL — ABNORMAL HIGH (ref 0.57–1.00)
Globulin, Total: 2.8 g/dL (ref 1.5–4.5)
Glucose: 82 mg/dL (ref 70–99)
Potassium: 4.2 mmol/L (ref 3.5–5.2)
Sodium: 141 mmol/L (ref 134–144)
Total Protein: 7.2 g/dL (ref 6.0–8.5)
eGFR: 36 mL/min/{1.73_m2} — ABNORMAL LOW (ref >59)

## 2023-10-04 LAB — MAGNESIUM: Magnesium: 2.2 mg/dL (ref 1.6–2.3)

## 2023-10-04 LAB — LIPID PANEL
Chol/HDL Ratio: 2 ratio (ref 0.0–4.4)
Cholesterol, Total: 137 mg/dL (ref 100–199)
HDL: 70 mg/dL (ref >39)
LDL Chol Calc (NIH): 44 mg/dL (ref 0–99)
Triglycerides: 133 mg/dL (ref 0–149)
VLDL Cholesterol Cal: 23 mg/dL (ref 5–40)

## 2023-10-04 LAB — LDL CHOLESTEROL, DIRECT: LDL Direct: 45 mg/dL (ref 0–99)

## 2023-10-08 DIAGNOSIS — H26493 Other secondary cataract, bilateral: Secondary | ICD-10-CM | POA: Diagnosis not present

## 2023-10-08 DIAGNOSIS — H35033 Hypertensive retinopathy, bilateral: Secondary | ICD-10-CM | POA: Diagnosis not present

## 2023-10-10 ENCOUNTER — Ambulatory Visit (HOSPITAL_COMMUNITY)
Admission: RE | Admit: 2023-10-10 | Discharge: 2023-10-10 | Disposition: A | Discharge status: Home / Self Care | Source: Ambulatory Visit | Attending: Physician Assistant | Admitting: Physician Assistant

## 2023-10-10 DIAGNOSIS — J449 Chronic obstructive pulmonary disease, unspecified: Secondary | ICD-10-CM | POA: Insufficient documentation

## 2023-10-10 LAB — PULMONARY FUNCTION TEST
DL/VA % pred: 61 %
DL/VA: 2.55 ml/min/mmHg/L
DLCO unc % pred: 43 %
DLCO unc: 7.71 ml/min/mmHg
FEF 25-75 Post: 0.49 L/s
FEF 25-75 Pre: 0.47 L/s
FEF2575-%Change-Post: 5 %
FEF2575-%Pred-Post: 31 %
FEF2575-%Pred-Pre: 29 %
FEV1-%Change-Post: 0 %
FEV1-%Pred-Post: 53 %
FEV1-%Pred-Pre: 54 %
FEV1-Post: 1.05 L
FEV1-Pre: 1.06 L
FEV1FVC-%Change-Post: -2 %
FEV1FVC-%Pred-Pre: 76 %
FEV6-%Change-Post: 1 %
FEV6-%Pred-Post: 71 %
FEV6-%Pred-Pre: 70 %
FEV6-Post: 1.78 L
FEV6-Pre: 1.76 L
FEV6FVC-%Change-Post: -1 %
FEV6FVC-%Pred-Post: 99 %
FEV6FVC-%Pred-Pre: 101 %
FVC-%Change-Post: 2 %
FVC-%Pred-Post: 71 %
FVC-%Pred-Pre: 70 %
FVC-Post: 1.88 L
FVC-Pre: 1.83 L
Post FEV1/FVC ratio: 56 %
Post FEV6/FVC ratio: 95 %
Pre FEV1/FVC ratio: 58 %
Pre FEV6/FVC Ratio: 96 %
RV % pred: 153 %
RV: 3.32 L
TLC % pred: 108 %
TLC: 5.17 L

## 2023-10-10 MED ORDER — ALBUTEROL SULFATE (2.5 MG/3ML) 0.083% IN NEBU
2.5000 mg | INHALATION_SOLUTION | Freq: Once | RESPIRATORY_TRACT | Status: AC
  Administered 2023-10-10: 2.5 mg via RESPIRATORY_TRACT

## 2023-10-11 ENCOUNTER — Encounter: Payer: Self-pay | Admitting: Pulmonary Disease

## 2023-10-11 ENCOUNTER — Ambulatory Visit: Admitting: Pulmonary Disease

## 2023-10-11 ENCOUNTER — Ambulatory Visit: Payer: Self-pay | Admitting: Physician Assistant

## 2023-10-11 VITALS — BP 132/83 | HR 104 | Ht 62.0 in | Wt 184.0 lb

## 2023-10-11 DIAGNOSIS — I5032 Chronic diastolic (congestive) heart failure: Secondary | ICD-10-CM | POA: Diagnosis not present

## 2023-10-11 DIAGNOSIS — R942 Abnormal results of pulmonary function studies: Secondary | ICD-10-CM | POA: Diagnosis not present

## 2023-10-11 DIAGNOSIS — J449 Chronic obstructive pulmonary disease, unspecified: Secondary | ICD-10-CM

## 2023-10-11 MED ORDER — BREZTRI AEROSPHERE 160-9-4.8 MCG/ACT IN AERO: INHALATION_SPRAY | RESPIRATORY_TRACT | Status: DC

## 2023-10-11 NOTE — Progress Notes (Signed)
 Synopsis: Referred in July 2025 for COPD  Subjective:   PATIENT ID: [name] [name] GENDER: female DOB: [birth year], MRN: [id number]  HPI  Chief Complaint  Patient presents with   Consult   [name] [name] is a 73 year old woman, former smoker with GERD, CAD and hypertension who is referred to pulmonary clinic for COPD.   She was admitted 3/18 to 4/4 for respiratory failure due to heart failure exacerbation. She did have lower extremity edema which is much improved.  PFTs 10/10/23 showed moderate obstruction, air trapping and diffusion defect.  She has significant exertional dyspnea. She does have cough and wheezing. She has as needed albuterol  but denies much relief. Reports not being discharged on oxygen. She is working with home PT. She lives at home alone. Her roommate passed away a few months ago. She has neighbors who help her. She has food delivered to the home.  She has trouble sleeping at night due to dyspnea.   She is a former smoker. Quit in 2017. She has history of pulmonary nodules, which have been stable on imaging since 2022. Recommended follow up in 06/2024.   Past Medical History:  Diagnosis Date   Allergic rhinitis    ALLERGIC RHINITIS 05/08/2007   Qualifier: Diagnosis of  By: Delos, CMA, Cindy     Allergy    seasonal   Anxiety    Anxiety state 05/25/2014   Aortic valve disease    a. ? possible bicuspid AV.   CAD (coronary artery disease)    a. nonobstructive by prior cath   Cataract    bilateral   Chronic low back pain 03/09/2016   Complication of anesthesia 2003   Medication for block went up to my brain and I stopped breathing   COPD (chronic obstructive pulmonary disease) (HCC)    COPD (chronic obstructive pulmonary disease) with chronic bronchitis (HCC) 10/12/2014   Depression    Depression, major, single episode, moderate (HCC) 05/08/2007   Qualifier: Diagnosis of  By: Delos, CMA, Cindy     Dexamethasone adverse reaction    2002 normal   Dilated  aortic root (HCC)    a. mild dilated aortic root on prior echo, ascending aorta prominence on CT 2015.   Duodenal mass    GERD 05/08/2007   Qualifier: Diagnosis of  By: Delos, CMA, Cindy     Hyperlipidemia    Hypertension    Insomnia    Neck pain, chronic 03/09/2016   NICOTINE ADDICTION 05/08/2007   Qualifier: Diagnosis of  By: Johnny MD, Garnette A    Osteoarthritis    Overactive bladder    Pneumonia    once or twivce in past   Premature ventricular contractions    LBBB inferior axis PVCs   Thoracic back pain 05/30/2014   Tobacco abuse    Ulcer    duodenal     Family History  Problem Relation Age of Onset   CAD Brother    Kidney disease Brother    Emphysema Mother    Other Father        brain tumor   Throat cancer Brother    Hyperlipidemia Other    Hypertension Other    Kidney disease Other    Heart disease Other    Colon cancer Neg Hx    Esophageal cancer Neg Hx    Stomach cancer Neg Hx    Rectal cancer Neg Hx      Social History   Socioeconomic History   Marital status: Single  Spouse name: Not on file   Number of children: 0   Years of education: Not on file   Highest education level: High school graduate  Occupational History   Occupation: CNA   Occupation: retired  Tobacco Use   Smoking status: Former    Current packs/day: 0.00    Average packs/day: 0.5 packs/day for 54.0 years (24.3 ttl pk-yrs)    Types: Cigarettes    Start date: 01/02/1963    Quit date: 01/01/2017    Years since quitting: 6.7   Smokeless tobacco: Never   Tobacco comments:    1/2 pack per day or less  Vaping Use   Vaping status: Never Used  Substance and Sexual Activity   Alcohol use: No    Alcohol/week: 0.0 standard drinks of alcohol   Drug use: No   Sexual activity: Not on file  Other Topics Concern   Not on file  Social History Narrative   Works as Lawyer at [employer].   Social Drivers of Corporate investment banker Strain: Low Risk  (06/26/2023)   Overall Financial  Resource Strain (CARDIA)    Difficulty of Paying Living Expenses: Not very hard  Food Insecurity: No Food Insecurity (06/19/2023)   Hunger Vital Sign    Worried About Running Out of Food in the Last Year: Never true    Ran Out of Food in the Last Year: Never true  Transportation Needs: No Transportation Needs (06/26/2023)   PRAPARE - Administrator, Civil Service (Medical): No    Lack of Transportation (Non-Medical): No  Physical Activity: Inactive (05/14/2023)   Exercise Vital Sign    Days of Exercise per Week: 0 days    Minutes of Exercise per Session: 0 min  Stress: No Stress Concern Present (05/14/2023)   [employer] of Occupational Health - Occupational Stress Questionnaire    Feeling of Stress : Not at all  Social Connections: Socially Isolated (06/19/2023)   Social Connection and Isolation Panel    Frequency of Communication with Friends and Family: Once a week    Frequency of Social Gatherings with Friends and Family: Never    Attends Religious Services: Never    Database administrator or Organizations: No    Attends Banker Meetings: Never    Marital Status: Never married  Intimate Partner Violence: Not At Risk (06/19/2023)   Humiliation, Afraid, Rape, and Kick questionnaire    Fear of Current or Ex-Partner: No    Emotionally Abused: No    Physically Abused: No    Sexually Abused: No     Allergies  Allergen Reactions   Augmentin  [Amoxicillin -Pot Clavulanate] Nausea And Vomiting   Clindamycin/Lincomycin Nausea And Vomiting   Colchicine  Diarrhea   Flecainide  Other (See Comments)    Conjunctivitis    Hydroxychloroquine Rash     Outpatient Medications Prior to Visit  Medication Sig Dispense Refill   albuterol  (PROAIR  HFA) 108 (90 Base) MCG/ACT inhaler Inhale 2 puffs into the lungs every 6 (six) hours as needed for wheezing. 8.5 each 11   ALPRAZolam  (XANAX ) 0.25 MG tablet Take 1 tablet (0.25 mg total) by mouth 3 (three) times daily as needed  for anxiety. TAKE 3 TABLETS BY MOUTH EVERY 6 HOURS AS NEEDED 90 tablet 3   ARIPiprazole  (ABILIFY ) 2 MG tablet GIVE TAKE 1 TABLET BY MOUTH ONE TIME A DAY RELATED TO TO MAJOR DEPRESSIVE DISORDER. 30 tablet 11   aspirin  EC 81 MG tablet Take 1 tablet (81 mg total)  by mouth daily. 90 tablet 3   atorvastatin  (LIPITOR) 20 MG tablet Take 1 tablet (20 mg total) by mouth daily. 30 tablet 0   cetirizine (ZYRTEC) 10 MG tablet Take 10 mg by mouth daily as needed for allergies.      cholecalciferol (VITAMIN D3) 25 MCG (1000 UNIT) tablet Take 1,000 Units by mouth daily.     cyclobenzaprine  (FLEXERIL ) 10 MG tablet GIVE 0.5 TABLET BY MOUTH 3 TIMES A DAY FOR MUSCLE SPASM RELIEF 45 tablet 5   furosemide  (LASIX ) 40 MG tablet Take 40 mg by mouth daily.     lidocaine  (HM LIDOCAINE  PATCH) 4 % Place 1 patch onto the skin daily.     magnesium  oxide (MAG-OX) 400 (240 Mg) MG tablet TAKE 1 TABLET BY MOUTH EVERY DAY 90 tablet 3   MELATONIN PO Take by mouth at bedtime. OTC     metoprolol  succinate (TOPROL  XL) 25 MG 24 hr tablet Take 0.5 tablets (12.5 mg total) by mouth daily. 45 tablet 3   mexiletine (MEXITIL ) 150 MG capsule Take 1 capsule (150 mg total) by mouth every 12 (twelve) hours. 180 capsule 2   nitroGLYCERIN  (NITROSTAT ) 0.4 MG SL tablet Place 1 tablet (0.4 mg total) under the tongue every 5 (five) minutes as needed for chest pain. 25 tablet 3   senna-docusate (SENOKOT-S) 8.6-50 MG tablet Take 1 tablet by mouth 2 (two) times daily. 20 tablet 0   spironolactone  (ALDACTONE ) 25 MG tablet Take 1 tablet (25 mg total) by mouth daily. 90 tablet 2   traMADol  (ULTRAM ) 50 MG tablet Take 1 tablet (50 mg total) by mouth every 6 (six) hours as needed for moderate pain (pain score 4-6). 20 tablet 0   No facility-administered medications prior to visit.   Review of Systems  Constitutional:  Negative for chills, fever, malaise/fatigue and weight loss.  HENT:  Negative for congestion, sinus pain and sore throat.   Eyes: Negative.    Respiratory:  Positive for cough, shortness of breath and wheezing. Negative for hemoptysis and sputum production.   Cardiovascular:  Negative for chest pain, palpitations, orthopnea, claudication and leg swelling.  Gastrointestinal:  Negative for abdominal pain, heartburn, nausea and vomiting.  Genitourinary: Negative.   Musculoskeletal:  Negative for joint pain and myalgias.  Skin:  Negative for rash.  Neurological:  Negative for weakness.  Endo/Heme/Allergies: Negative.   Psychiatric/Behavioral: Negative.     Objective:   Vitals:   10/11/23 1420  BP: 132/83  Pulse: (!) 104  SpO2: 93%  Weight: 184 lb (83.5 kg)  Height: 5' 2 (1.575 m)   Physical Exam Constitutional:      General: She is not in acute distress.    Appearance: Normal appearance. She is obese.  Eyes:     General: No scleral icterus.    Conjunctiva/sclera: Conjunctivae normal.  Cardiovascular:     Rate and Rhythm: Normal rate and regular rhythm.  Pulmonary:     Breath sounds: No wheezing, rhonchi or rales.  Musculoskeletal:     Right lower leg: No edema.     Left lower leg: No edema.  Skin:    General: Skin is warm and dry.  Neurological:     General: No focal deficit present.    Resting   Supplemental oxygen during test? --  Resting Heart Rate 66  Resting Sp02 96  Lap 1 (250 feet)   HR 72  02 Sat 81  Lap 2 (250 feet)   HR --  02 Sat --  Lap  3 (250 feet)   HR --  02 Sat --  Tech Comments: pt ccould not do full lap w/ O2 dropping to 81% plaved on POC 2L , O2 back to 97%    CBC    Component Value Date/Time   WBC 8.4 06/22/2023 0305   RBC 4.24 06/22/2023 0305   HGB 13.6 06/22/2023 1647   HGB 14.6 01/28/2019 1648   HCT 40.0 06/22/2023 1647   HCT 44.9 01/28/2019 1648   PLT 299 06/22/2023 0305   PLT 299 01/28/2019 1648   MCV 97.4 06/22/2023 0305   MCV 94 01/28/2019 1648   MCH 30.2 06/22/2023 0305   MCHC 31.0 06/22/2023 0305   RDW 13.4 06/22/2023 0305   RDW 12.8 01/28/2019 1648    LYMPHSABS 2.1 06/22/2023 0305   MONOABS 0.9 06/22/2023 0305   EOSABS 0.5 06/22/2023 0305   BASOSABS 0.1 06/22/2023 0305      Latest Ref Rng & Units 10/03/2023    3:37 PM 08/10/2023    3:08 PM 08/01/2023    2:48 PM  BMP  Glucose 70 - 99 mg/dL 82  94  862   BUN 8 - 27 mg/dL 30  25  24    Creatinine 0.57 - 1.00 mg/dL 8.49  8.51  8.36   BUN/Creat Ratio 12 - 28 20     Sodium 134 - 144 mmol/L 141  138  136   Potassium 3.5 - 5.2 mmol/L 4.2  4.5  4.5   Chloride 96 - 106 mmol/L 99  102  98   CO2 20 - 29 mmol/L 23  24  28    Calcium  8.7 - 10.3 mg/dL 9.4  9.8  9.6    Chest imaging: CTA Chest 06/18/23 Mediastinum/Nodes: No enlarged mediastinal, hilar, or axillary lymph nodes. Thyroid  gland, trachea, and esophagus demonstrate no significant findings.   Lungs/Pleura: Scattered atelectasis or scarring is noted bilaterally. There are small bilateral pleural effusions with dependent atelectasis in the lower lobes. A nodular opacity is noted in the right middle lobe measuring 1.1 cm, versus 1.0 cm on the previous exam. There is a 7 mm nodule in the left lower lobe, versus 6 mm on the previous exam, axial image 72. No new nodule is seen. No pneumothorax.  PFT:    Latest Ref Rng & Units 10/10/2023    2:04 PM  PFT Results  FVC-Pre L 1.83   FVC-Predicted Pre % 70   FVC-Post L 1.88   FVC-Predicted Post % 71   Pre FEV1/FVC % % 58   Post FEV1/FCV % % 56   FEV1-Pre L 1.06   FEV1-Predicted Pre % 54   FEV1-Post L 1.05   DLCO uncorrected ml/min/mmHg 7.71   DLCO UNC% % 43   DLVA Predicted % 61   TLC L 5.17   TLC % Predicted % 108   RV % Predicted % 153     Labs:  Path:  Echo: LV EF 60-65%. Flattened interventricular septum. RV systolic function is mildly reduced  Heart Catheterization:    Assessment & Plan:   Chronic obstructive pulmonary disease, unspecified COPD type (HCC) - Plan: Pulse oximetry, overnight  Diffusion capacity of lung (dl), decreased  Chronic heart failure with  preserved ejection fraction (HCC)  Discussion: [name] [name] is a 73 year old woman, former smoker with GERD, CAD and hypertension who is referred to pulmonary clinic for COPD.   COPD - try breztri  inhaler, 2 puffs twice daily - rinse mouth out after each use - she is  to let us  know if she would like a prescription to continue on this medicaiton - use albuterol  inhaler as needed - check overnight oxygen study on room air - Patient desaturated below 88% with ambulation, titrated to 2L pulsed via POC  Lung nodules - plan for follow up CT Chest in 06/2024, will place order at follow up visit  HFrEF - managed by cardiology team  Follow up in 3 months.  Dorn Chill, MD [facility] Office: [phone]   Current Outpatient Medications:    albuterol  (PROAIR  HFA) 108 (90 Base) MCG/ACT inhaler, Inhale 2 puffs into the lungs every 6 (six) hours as needed for wheezing., Disp: 8.5 each, Rfl: 11   ALPRAZolam  (XANAX ) 0.25 MG tablet, Take 1 tablet (0.25 mg total) by mouth 3 (three) times daily as needed for anxiety. TAKE 3 TABLETS BY MOUTH EVERY 6 HOURS AS NEEDED, Disp: 90 tablet, Rfl: 3   ARIPiprazole  (ABILIFY ) 2 MG tablet, GIVE TAKE 1 TABLET BY MOUTH ONE TIME A DAY RELATED TO TO MAJOR DEPRESSIVE DISORDER., Disp: 30 tablet, Rfl: 11   aspirin  EC 81 MG tablet, Take 1 tablet (81 mg total) by mouth daily., Disp: 90 tablet, Rfl: 3   atorvastatin  (LIPITOR) 20 MG tablet, Take 1 tablet (20 mg total) by mouth daily., Disp: 30 tablet, Rfl: 0   cetirizine (ZYRTEC) 10 MG tablet, Take 10 mg by mouth daily as needed for allergies. , Disp: , Rfl:    cholecalciferol (VITAMIN D3) 25 MCG (1000 UNIT) tablet, Take 1,000 Units by mouth daily., Disp: , Rfl:    cyclobenzaprine  (FLEXERIL ) 10 MG tablet, GIVE 0.5 TABLET BY MOUTH 3 TIMES A DAY FOR MUSCLE SPASM RELIEF, Disp: 45 tablet, Rfl: 5   furosemide  (LASIX ) 40 MG tablet, Take 40 mg by mouth daily., Disp: , Rfl:    lidocaine  (HM LIDOCAINE   PATCH) 4 %, Place 1 patch onto the skin daily., Disp: , Rfl:    magnesium  oxide (MAG-OX) 400 (240 Mg) MG tablet, TAKE 1 TABLET BY MOUTH EVERY DAY, Disp: 90 tablet, Rfl: 3   MELATONIN PO, Take by mouth at bedtime. OTC, Disp: , Rfl:    metoprolol  succinate (TOPROL  XL) 25 MG 24 hr tablet, Take 0.5 tablets (12.5 mg total) by mouth daily., Disp: 45 tablet, Rfl: 3   mexiletine (MEXITIL ) 150 MG capsule, Take 1 capsule (150 mg total) by mouth every 12 (twelve) hours., Disp: 180 capsule, Rfl: 2   nitroGLYCERIN  (NITROSTAT ) 0.4 MG SL tablet, Place 1 tablet (0.4 mg total) under the tongue every 5 (five) minutes as needed for chest pain., Disp: 25 tablet, Rfl: 3   senna-docusate (SENOKOT-S) 8.6-50 MG tablet, Take 1 tablet by mouth 2 (two) times daily., Disp: 20 tablet, Rfl: 0   spironolactone  (ALDACTONE ) 25 MG tablet, Take 1 tablet (25 mg total) by mouth daily., Disp: 90 tablet, Rfl: 2   traMADol  (ULTRAM ) 50 MG tablet, Take 1 tablet (50 mg total) by mouth every 6 (six) hours as needed for moderate pain (pain score 4-6)., Disp: 20 tablet, Rfl: 0

## 2023-10-11 NOTE — Patient Instructions (Addendum)
 Try breztri  inhaler 2 puffs twice daily - rinse mouth out after each use  Use albuterol  inhaler 1-2 puffs every 4-6 hours as needed  We will check your oxygen levels at night when sleeping via overnight oxygen test  We will check your oxygen levels with activity today  Your breathing tests show moderate obstruction with air trapping and diffusion defect. - these changes can certainly make you short of breath  Follow up in 3 months

## 2023-10-13 ENCOUNTER — Other Ambulatory Visit: Payer: Self-pay | Admitting: Family Medicine

## 2023-10-15 DIAGNOSIS — M069 Rheumatoid arthritis, unspecified: Secondary | ICD-10-CM | POA: Diagnosis not present

## 2023-10-15 DIAGNOSIS — R911 Solitary pulmonary nodule: Secondary | ICD-10-CM | POA: Diagnosis not present

## 2023-10-15 DIAGNOSIS — Z9181 History of falling: Secondary | ICD-10-CM | POA: Diagnosis not present

## 2023-10-15 DIAGNOSIS — I272 Pulmonary hypertension, unspecified: Secondary | ICD-10-CM | POA: Diagnosis not present

## 2023-10-15 DIAGNOSIS — J449 Chronic obstructive pulmonary disease, unspecified: Secondary | ICD-10-CM | POA: Diagnosis not present

## 2023-10-15 DIAGNOSIS — I25119 Atherosclerotic heart disease of native coronary artery with unspecified angina pectoris: Secondary | ICD-10-CM | POA: Diagnosis not present

## 2023-10-15 DIAGNOSIS — Z7982 Long term (current) use of aspirin: Secondary | ICD-10-CM | POA: Diagnosis not present

## 2023-10-15 DIAGNOSIS — E782 Mixed hyperlipidemia: Secondary | ICD-10-CM | POA: Diagnosis not present

## 2023-10-15 DIAGNOSIS — N1832 Chronic kidney disease, stage 3b: Secondary | ICD-10-CM | POA: Diagnosis not present

## 2023-10-15 DIAGNOSIS — N179 Acute kidney failure, unspecified: Secondary | ICD-10-CM | POA: Diagnosis not present

## 2023-10-15 DIAGNOSIS — J9601 Acute respiratory failure with hypoxia: Secondary | ICD-10-CM | POA: Diagnosis not present

## 2023-10-15 DIAGNOSIS — I130 Hypertensive heart and chronic kidney disease with heart failure and stage 1 through stage 4 chronic kidney disease, or unspecified chronic kidney disease: Secondary | ICD-10-CM | POA: Diagnosis not present

## 2023-10-15 DIAGNOSIS — I5033 Acute on chronic diastolic (congestive) heart failure: Secondary | ICD-10-CM | POA: Diagnosis not present

## 2023-10-19 DIAGNOSIS — H26491 Other secondary cataract, right eye: Secondary | ICD-10-CM | POA: Diagnosis not present

## 2023-10-23 DIAGNOSIS — I272 Pulmonary hypertension, unspecified: Secondary | ICD-10-CM | POA: Diagnosis not present

## 2023-10-23 DIAGNOSIS — J449 Chronic obstructive pulmonary disease, unspecified: Secondary | ICD-10-CM | POA: Diagnosis not present

## 2023-10-23 DIAGNOSIS — Z7982 Long term (current) use of aspirin: Secondary | ICD-10-CM | POA: Diagnosis not present

## 2023-10-23 DIAGNOSIS — M069 Rheumatoid arthritis, unspecified: Secondary | ICD-10-CM | POA: Diagnosis not present

## 2023-10-23 DIAGNOSIS — N179 Acute kidney failure, unspecified: Secondary | ICD-10-CM | POA: Diagnosis not present

## 2023-10-23 DIAGNOSIS — J9601 Acute respiratory failure with hypoxia: Secondary | ICD-10-CM | POA: Diagnosis not present

## 2023-10-23 DIAGNOSIS — E782 Mixed hyperlipidemia: Secondary | ICD-10-CM | POA: Diagnosis not present

## 2023-10-23 DIAGNOSIS — Z9181 History of falling: Secondary | ICD-10-CM | POA: Diagnosis not present

## 2023-10-23 DIAGNOSIS — N1832 Chronic kidney disease, stage 3b: Secondary | ICD-10-CM | POA: Diagnosis not present

## 2023-10-23 DIAGNOSIS — R911 Solitary pulmonary nodule: Secondary | ICD-10-CM | POA: Diagnosis not present

## 2023-10-23 DIAGNOSIS — I25119 Atherosclerotic heart disease of native coronary artery with unspecified angina pectoris: Secondary | ICD-10-CM | POA: Diagnosis not present

## 2023-10-23 DIAGNOSIS — I130 Hypertensive heart and chronic kidney disease with heart failure and stage 1 through stage 4 chronic kidney disease, or unspecified chronic kidney disease: Secondary | ICD-10-CM | POA: Diagnosis not present

## 2023-10-23 DIAGNOSIS — I5033 Acute on chronic diastolic (congestive) heart failure: Secondary | ICD-10-CM | POA: Diagnosis not present

## 2023-10-25 DIAGNOSIS — H26493 Other secondary cataract, bilateral: Secondary | ICD-10-CM | POA: Diagnosis not present

## 2023-10-31 DIAGNOSIS — M069 Rheumatoid arthritis, unspecified: Secondary | ICD-10-CM | POA: Diagnosis not present

## 2023-10-31 DIAGNOSIS — E782 Mixed hyperlipidemia: Secondary | ICD-10-CM | POA: Diagnosis not present

## 2023-10-31 DIAGNOSIS — I5033 Acute on chronic diastolic (congestive) heart failure: Secondary | ICD-10-CM | POA: Diagnosis not present

## 2023-10-31 DIAGNOSIS — Z7982 Long term (current) use of aspirin: Secondary | ICD-10-CM | POA: Diagnosis not present

## 2023-10-31 DIAGNOSIS — N1832 Chronic kidney disease, stage 3b: Secondary | ICD-10-CM | POA: Diagnosis not present

## 2023-10-31 DIAGNOSIS — I25119 Atherosclerotic heart disease of native coronary artery with unspecified angina pectoris: Secondary | ICD-10-CM | POA: Diagnosis not present

## 2023-10-31 DIAGNOSIS — I272 Pulmonary hypertension, unspecified: Secondary | ICD-10-CM | POA: Diagnosis not present

## 2023-10-31 DIAGNOSIS — I130 Hypertensive heart and chronic kidney disease with heart failure and stage 1 through stage 4 chronic kidney disease, or unspecified chronic kidney disease: Secondary | ICD-10-CM | POA: Diagnosis not present

## 2023-10-31 DIAGNOSIS — J449 Chronic obstructive pulmonary disease, unspecified: Secondary | ICD-10-CM | POA: Diagnosis not present

## 2023-10-31 DIAGNOSIS — R911 Solitary pulmonary nodule: Secondary | ICD-10-CM | POA: Diagnosis not present

## 2023-10-31 DIAGNOSIS — Z9181 History of falling: Secondary | ICD-10-CM | POA: Diagnosis not present

## 2023-10-31 DIAGNOSIS — N179 Acute kidney failure, unspecified: Secondary | ICD-10-CM | POA: Diagnosis not present

## 2023-10-31 DIAGNOSIS — J9601 Acute respiratory failure with hypoxia: Secondary | ICD-10-CM | POA: Diagnosis not present

## 2023-11-04 DIAGNOSIS — G4730 Sleep apnea, unspecified: Secondary | ICD-10-CM | POA: Diagnosis not present

## 2023-11-04 DIAGNOSIS — R0902 Hypoxemia: Secondary | ICD-10-CM | POA: Diagnosis not present

## 2023-11-09 ENCOUNTER — Encounter: Payer: Self-pay | Admitting: Physician Assistant

## 2023-11-09 ENCOUNTER — Telehealth: Payer: Self-pay | Admitting: Internal Medicine

## 2023-11-09 ENCOUNTER — Ambulatory Visit: Attending: Physician Assistant

## 2023-11-09 ENCOUNTER — Ambulatory Visit: Attending: Physician Assistant | Admitting: Physician Assistant

## 2023-11-09 VITALS — BP 114/74 | HR 97 | Ht 62.0 in | Wt 185.8 lb

## 2023-11-09 DIAGNOSIS — I493 Ventricular premature depolarization: Secondary | ICD-10-CM

## 2023-11-09 NOTE — Progress Notes (Unsigned)
Enrolled patient for a 7 day Zio XT monitor to be mailed to patients home  ° °Lambert to read °

## 2023-11-09 NOTE — Telephone Encounter (Signed)
 Pt inquiring about scheduling an appt with [facility] per referral from PCP Sherel Slocumb). Pt came into office in attempts to sched appt - states she received vm to sched appt, but has been unsuccessful in getting in contact with someone.   JB, 11-09-23

## 2023-11-09 NOTE — Patient Instructions (Signed)
 Medication Instructions:   Your physician recommends that you continue on your current medications as directed. Please refer to the Current Medication list given to you today.'   *If you need a refill on your cardiac medications before your next appointment, please call your pharmacy*  Lab Work: NONE ORDERED  TODAY    If you have labs (blood work) drawn today and your tests are completely normal, you will receive your results only by: MyChart Message (if you have MyChart) OR A paper copy in the mail If you have any lab test that is abnormal or we need to change your treatment, we will call you to review the results.    Testing/Procedures: Your physician has recommended that you wear an event monitor. Event monitors are medical devices that record the heart's electrical activity. Doctors most often us  these monitors to diagnose arrhythmias. Arrhythmias are problems with the speed or rhythm of the heartbeat. The monitor is a small, portable device. You can wear one while you do your normal daily activities. This is usually used to diagnose what is causing palpitations/syncope (passing out).     Follow-Up: At [facility], you and your health needs are our priority.  As part of our continuing mission to provide you with exceptional heart care, our providers are all part of one team.  This team includes your primary Cardiologist (physician) and Advanced Practice Providers or APPs (Physician Assistants and Nurse Practitioners) who all work together to provide you with the care you need, when you need it.  Your next appointment:    2 month(s)  Provider:   Charlies Arthur, PA-C  ( CONTACT  CASSIE HALL/ ANGELINE HAMMER FOR EP SCHEDULING ISSUES )    We recommend signing up for the patient portal called MyChart.  Sign up information is provided on this After Visit Summary.  MyChart is used to connect with patients for Virtual Visits (Telemedicine).  Patients are able to view lab/test  results, encounter notes, upcoming appointments, etc.  Non-urgent messages can be sent to your provider as well.   To learn more about what you can do with MyChart, go to [email or web address].     Other Instructions   ZIO XT- Long Term Monitor Instructions  Your physician has requested you wear a ZIO patch monitor for  7  days.  This is a single patch monitor. [employer] supplies one patch monitor per enrollment. Additional stickers are not available. Please do not apply patch if you will be having a Nuclear Stress Test,  Echocardiogram, Cardiac CT, MRI, or Chest Xray during the period you would be wearing the  monitor. The patch cannot be worn during these tests. You cannot remove and re-apply the  ZIO XT patch monitor.  Your ZIO patch monitor will be mailed 3 day USPS to your address on file. It may take 3-5 days  to receive your monitor after you have been enrolled.  Once you have received your monitor, please review the enclosed instructions. Your monitor  has already been registered assigning a specific monitor serial # to you.  Billing and Patient Assistance Program Information  We have supplied [employer] with any of your insurance information on file for billing purposes. [employer] offers a sliding scale Patient Assistance Program for patients that do not have  insurance, or whose insurance does not completely cover the cost of the ZIO monitor.  You must apply for the Patient Assistance Program to qualify for this discounted rate.  To apply, please call [employer] at  [phone], select option 4, select option 2, ask to apply for  Patient Assistance Program. Meredeth will ask your household income, and how many people  are in your household. They will quote your out-of-pocket cost based on that information.  [employer] will also be able to set up a 59-month, interest-free payment plan if needed.  Applying the monitor   Shave hair from upper left chest.  Hold abrader disc by orange  tab. Rub abrader in 40 strokes over the upper left chest as  indicated in your monitor instructions.  Clean area with 4 enclosed alcohol pads. Let dry.  Apply patch as indicated in monitor instructions. Patch will be placed under collarbone on left  side of chest with arrow pointing upward.  Rub patch adhesive wings for 2 minutes. Remove white label marked 1. Remove the white  label marked 2. Rub patch adhesive wings for 2 additional minutes.  While looking in a mirror, press and release button in center of patch. A small green light will  flash 3-4 times. This will be your only indicator that the monitor has been turned on.  Do not shower for the first 24 hours. You may shower after the first 24 hours.  Press the button if you feel a symptom. You will hear a small click. Record Date, Time and  Symptom in the Patient Logbook.  When you are ready to remove the patch, follow instructions on the last 2 pages of Patient  Logbook. Stick patch monitor onto the last page of Patient Logbook.  Place Patient Logbook in the blue and white box. Use locking tab on box and tape box closed  securely. The blue and white box has prepaid postage on it. Please place it in the mailbox as  soon as possible. Your physician should have your test results approximately 7 days after the  monitor has been mailed back to [facility].  Call [facility] Customer Care at [phone] if you have questions regarding  your ZIO XT patch monitor. Call them immediately if you see an orange light blinking on your  monitor.  If your monitor falls off in less than 4 days, contact our Monitor department at [phone].  If your monitor becomes loose or falls off after 4 days call [employer] at [phone] for  suggestions on securing your monitor

## 2023-11-09 NOTE — Progress Notes (Signed)
 Cardiology Office Note:  .   Date:  11/09/2023  ID:  [name] [name], DOB January 11, 1951, MRN [id number] PCP: [name] [name] LABOR, MD  [facility] Providers Cardiologist:  Lonni Cash, MD { EP: Dr. Kelsie >>> plan for Dr. Cindie   History of Present Illness: .   [name] [name] is a 73 y.o. female w/PMHx of  COPD, p.HTN HLD, HTN, CKD (IIIa) CAD HFpEF PVCs  Last saw EP May 2021, doing well on flecainide      admitted 3/17-07/06/23 with dyspnea and hypoxia requiring BiPAP. This was felt due to acute on chronic HFpEF.  -- 2D echo showed EF 60-65%, G1DD, interventricular septum is flattened in systole and diastole, consistent with right ventricular pressure and volume overload, mildly reduced RV function,  -- aortic sclerosis. CTA neg for PE, + small pleural effusions, scattered pulmonary nodules, 4cm ascending TAA, coronary calcifications. With diuresis she developed AKI and hypotension so required fluid back.  -- Cardiac cath 06/22/23 showed 50-60% mLAD, moderate pHTN, preserved cardiac output, recommended for medical therapy  -- Flecainide  was transitioned to mexiletine by EP given CAD.   Looks like Dr. Cindie saw her for weekend coverage during this stay, though no formal EP consult noted  She saw D.Dunn, PA 10/03/23, reports she saw HF team in follow-up 4/30 who suspected residual edema was due to to amlodipine . She was not felt to be a candidate for SGLT2i with urinary retention. Pt noted mild edema, in general, significantly improved, + DOE Noted PVCs, planned for labs rec EP f/u  Today's visit is scheduled as a f/u ROS:   She is doing OK Says from her heart standpoint continues to feel like she is doing so much better No CP Infrequently has an awareness of a different heart beat. No rest SOB, + DOE at her new baseline (much improved) No dizzy spells, near syncope or syncope  She mentions that she recently failed her oxygen test that her levels dropped into  the 80's > has been rx PRN oxygen   She doesn't quite remember all the way back to when she got started on flecainide  years ago, but doesn't recall ever being particularly aware of palpitations or any overwhelming symptoms.  Arrhythmia/AAD hx PVCs Flecainide  started 2016, stopped March 2025 2/2 CAD Mexiletine started March 2025  Studies Reviewed: [name]    EKG not done today  06/19/23: TTE 1. Left ventricular ejection fraction, by estimation, is 60 to 65%. The  left ventricle has normal function. The left ventricle has no regional  wall motion abnormalities. Left ventricular diastolic parameters are  consistent with Grade I diastolic  dysfunction (impaired relaxation). There is the interventricular septum is  flattened in systole and diastole, consistent with right ventricular  pressure and volume overload.   2. Right ventricular systolic function is mildly reduced. The right  ventricular size is normal.   3. The mitral valve is normal in structure. No evidence of mitral valve  regurgitation. No evidence of mitral stenosis.   4. The aortic valve is tricuspid. Aortic valve regurgitation is trivial.  Aortic valve sclerosis is present, with no evidence of aortic valve  stenosis.   5. The inferior vena cava is normal in size with greater than 50%  respiratory variability, suggesting right atrial pressure of 3 mmHg.     LHC/RHC 06/22/2023 1.  Patent left main with no significant stenosis 2.  Patent LAD with diffuse calcification and moderate mid vessel stenosis of 50 to 60% 3.  Patent circumflex  with no significant stenosis 4.  Large, dominant RCA with diffuse calcification but no significant stenosis 5.  Normal LVEDP 6.  Moderate pulmonary hypertension with PA pressure 65/33 mean 45 mmHg, transpulmonary gradient 25 mmHg, PVR 6 Wood units 7.  Preserved cardiac output of 4 L/min with a cardiac index of 2.2   Recommend: medical therapy, no indication for PCI, further evaluation/treatment  of pulmonary HTN per inpatient team   Risk Assessment/Calculations:    Physical Exam:   VS:  There were no vitals taken for this visit.   Wt Readings from Last 3 Encounters:  10/11/23 184 lb (83.5 kg)  10/03/23 186 lb (84.4 kg)  08/01/23 181 lb 9.6 oz (82.4 kg)    GEN: Well nourished, well developed in no acute distress NECK: No JVD; No carotid bruits CARDIAC: RRR, she has occasional extrasystoles, no murmurs, rubs, gallops RESPIRATORY:  CTA b/l without rales, wheezing or rhonchi  ABDOMEN: Soft, non-tender, non-distended EXTREMITIES:  No edema; No deformity   ASSESSMENT AND PLAN: .    PVCs Not particularly symptomatic Tolerating mexiletine Would like to get a better idea of overall burden, plan for monitoring She has less today on exam then her last EKG, but not none Now using PRN Oxygen > may also help her PVCs   Dispo: back in 2 mo, sooner if needed pending monitor findings  Signed, Charlies Macario Arthur, PA-C

## 2023-11-15 DIAGNOSIS — N1832 Chronic kidney disease, stage 3b: Secondary | ICD-10-CM | POA: Diagnosis not present

## 2023-11-15 LAB — CBC AND DIFFERENTIAL: Hemoglobin: 14.4 (ref 12.0–16.0)

## 2023-11-15 LAB — MICROALBUMIN / CREATININE URINE RATIO: Microalb Creat Ratio: 167

## 2023-11-15 LAB — BASIC METABOLIC PANEL WITH GFR
Creatinine: 1.6 — AB (ref 0.5–1.1)
Potassium: 4.4 meq/L (ref 3.5–5.1)

## 2023-11-15 LAB — COMPREHENSIVE METABOLIC PANEL WITH GFR: eGFR: 35

## 2023-11-16 DIAGNOSIS — I130 Hypertensive heart and chronic kidney disease with heart failure and stage 1 through stage 4 chronic kidney disease, or unspecified chronic kidney disease: Secondary | ICD-10-CM | POA: Diagnosis not present

## 2023-11-16 DIAGNOSIS — J9601 Acute respiratory failure with hypoxia: Secondary | ICD-10-CM | POA: Diagnosis not present

## 2023-11-16 DIAGNOSIS — I25119 Atherosclerotic heart disease of native coronary artery with unspecified angina pectoris: Secondary | ICD-10-CM | POA: Diagnosis not present

## 2023-11-16 DIAGNOSIS — Z9181 History of falling: Secondary | ICD-10-CM | POA: Diagnosis not present

## 2023-11-16 DIAGNOSIS — N179 Acute kidney failure, unspecified: Secondary | ICD-10-CM | POA: Diagnosis not present

## 2023-11-16 DIAGNOSIS — Z7982 Long term (current) use of aspirin: Secondary | ICD-10-CM | POA: Diagnosis not present

## 2023-11-16 DIAGNOSIS — I5033 Acute on chronic diastolic (congestive) heart failure: Secondary | ICD-10-CM | POA: Diagnosis not present

## 2023-11-16 DIAGNOSIS — N1832 Chronic kidney disease, stage 3b: Secondary | ICD-10-CM | POA: Diagnosis not present

## 2023-11-16 DIAGNOSIS — J449 Chronic obstructive pulmonary disease, unspecified: Secondary | ICD-10-CM | POA: Diagnosis not present

## 2023-11-16 DIAGNOSIS — M069 Rheumatoid arthritis, unspecified: Secondary | ICD-10-CM | POA: Diagnosis not present

## 2023-11-16 DIAGNOSIS — I272 Pulmonary hypertension, unspecified: Secondary | ICD-10-CM | POA: Diagnosis not present

## 2023-11-16 DIAGNOSIS — R911 Solitary pulmonary nodule: Secondary | ICD-10-CM | POA: Diagnosis not present

## 2023-11-16 DIAGNOSIS — E782 Mixed hyperlipidemia: Secondary | ICD-10-CM | POA: Diagnosis not present

## 2023-11-19 ENCOUNTER — Telehealth: Payer: Self-pay

## 2023-11-19 ENCOUNTER — Telehealth: Payer: Self-pay | Admitting: Cardiovascular Disease

## 2023-11-19 NOTE — Telephone Encounter (Signed)
 Copied from CRM [id number]. Topic: Clinical - Home Health Verbal Orders >> Nov 16, 2023  4:55 PM Jayma L wrote: Caller/Agency: gracee from [facility]  Callback Number: [phone] Service Requested: home health physical therapy for recertification Frequency: 1 time a week for 9 weeks, callback number is [phone] Any new concerns about the patient? No

## 2023-11-20 ENCOUNTER — Encounter: Payer: Self-pay | Admitting: Pulmonary Disease

## 2023-11-20 DIAGNOSIS — I2510 Atherosclerotic heart disease of native coronary artery without angina pectoris: Secondary | ICD-10-CM | POA: Diagnosis not present

## 2023-11-20 DIAGNOSIS — I129 Hypertensive chronic kidney disease with stage 1 through stage 4 chronic kidney disease, or unspecified chronic kidney disease: Secondary | ICD-10-CM | POA: Diagnosis not present

## 2023-11-20 DIAGNOSIS — N2581 Secondary hyperparathyroidism of renal origin: Secondary | ICD-10-CM | POA: Diagnosis not present

## 2023-11-20 DIAGNOSIS — I5032 Chronic diastolic (congestive) heart failure: Secondary | ICD-10-CM | POA: Diagnosis not present

## 2023-11-20 DIAGNOSIS — N1832 Chronic kidney disease, stage 3b: Secondary | ICD-10-CM | POA: Diagnosis not present

## 2023-11-20 NOTE — Telephone Encounter (Signed)
Please okay the orders  

## 2023-11-21 NOTE — Telephone Encounter (Signed)
 Left a detailed message for Gracee advised of approval for VO requested

## 2023-11-26 NOTE — Telephone Encounter (Signed)
 Left another VM for Gracee advising of the approval of VO requested

## 2023-11-28 DIAGNOSIS — I493 Ventricular premature depolarization: Secondary | ICD-10-CM | POA: Diagnosis not present

## 2023-11-29 DIAGNOSIS — Z9181 History of falling: Secondary | ICD-10-CM | POA: Diagnosis not present

## 2023-11-29 DIAGNOSIS — N179 Acute kidney failure, unspecified: Secondary | ICD-10-CM | POA: Diagnosis not present

## 2023-11-29 DIAGNOSIS — J449 Chronic obstructive pulmonary disease, unspecified: Secondary | ICD-10-CM | POA: Diagnosis not present

## 2023-11-29 DIAGNOSIS — E782 Mixed hyperlipidemia: Secondary | ICD-10-CM | POA: Diagnosis not present

## 2023-11-29 DIAGNOSIS — J9601 Acute respiratory failure with hypoxia: Secondary | ICD-10-CM | POA: Diagnosis not present

## 2023-11-29 DIAGNOSIS — R911 Solitary pulmonary nodule: Secondary | ICD-10-CM | POA: Diagnosis not present

## 2023-11-29 DIAGNOSIS — N1832 Chronic kidney disease, stage 3b: Secondary | ICD-10-CM | POA: Diagnosis not present

## 2023-11-29 DIAGNOSIS — I5033 Acute on chronic diastolic (congestive) heart failure: Secondary | ICD-10-CM | POA: Diagnosis not present

## 2023-11-29 DIAGNOSIS — Z7982 Long term (current) use of aspirin: Secondary | ICD-10-CM | POA: Diagnosis not present

## 2023-11-29 DIAGNOSIS — I272 Pulmonary hypertension, unspecified: Secondary | ICD-10-CM | POA: Diagnosis not present

## 2023-11-29 DIAGNOSIS — I25119 Atherosclerotic heart disease of native coronary artery with unspecified angina pectoris: Secondary | ICD-10-CM | POA: Diagnosis not present

## 2023-11-29 DIAGNOSIS — M069 Rheumatoid arthritis, unspecified: Secondary | ICD-10-CM | POA: Diagnosis not present

## 2023-12-04 ENCOUNTER — Ambulatory Visit: Payer: Self-pay | Admitting: Physician Assistant

## 2023-12-04 DIAGNOSIS — I493 Ventricular premature depolarization: Secondary | ICD-10-CM | POA: Diagnosis not present

## 2023-12-05 ENCOUNTER — Telehealth: Payer: Self-pay | Admitting: Pulmonary Disease

## 2023-12-05 DIAGNOSIS — R911 Solitary pulmonary nodule: Secondary | ICD-10-CM | POA: Diagnosis not present

## 2023-12-05 DIAGNOSIS — I130 Hypertensive heart and chronic kidney disease with heart failure and stage 1 through stage 4 chronic kidney disease, or unspecified chronic kidney disease: Secondary | ICD-10-CM | POA: Diagnosis not present

## 2023-12-05 DIAGNOSIS — M069 Rheumatoid arthritis, unspecified: Secondary | ICD-10-CM | POA: Diagnosis not present

## 2023-12-05 DIAGNOSIS — E782 Mixed hyperlipidemia: Secondary | ICD-10-CM | POA: Diagnosis not present

## 2023-12-05 DIAGNOSIS — N1832 Chronic kidney disease, stage 3b: Secondary | ICD-10-CM | POA: Diagnosis not present

## 2023-12-05 DIAGNOSIS — J449 Chronic obstructive pulmonary disease, unspecified: Secondary | ICD-10-CM | POA: Diagnosis not present

## 2023-12-05 DIAGNOSIS — Z9181 History of falling: Secondary | ICD-10-CM | POA: Diagnosis not present

## 2023-12-05 DIAGNOSIS — I5033 Acute on chronic diastolic (congestive) heart failure: Secondary | ICD-10-CM | POA: Diagnosis not present

## 2023-12-05 DIAGNOSIS — Z7982 Long term (current) use of aspirin: Secondary | ICD-10-CM | POA: Diagnosis not present

## 2023-12-05 DIAGNOSIS — I25119 Atherosclerotic heart disease of native coronary artery with unspecified angina pectoris: Secondary | ICD-10-CM | POA: Diagnosis not present

## 2023-12-05 DIAGNOSIS — N179 Acute kidney failure, unspecified: Secondary | ICD-10-CM | POA: Diagnosis not present

## 2023-12-05 DIAGNOSIS — J9601 Acute respiratory failure with hypoxia: Secondary | ICD-10-CM | POA: Diagnosis not present

## 2023-12-05 DIAGNOSIS — I272 Pulmonary hypertension, unspecified: Secondary | ICD-10-CM | POA: Diagnosis not present

## 2023-12-05 NOTE — Telephone Encounter (Signed)
 ONO Results:  She spent 1 hr 21 minutes with an SpO2 88% or less. Recommend using 2L of oxygen with sleep. Please send in DME order if she does not already have home O2 concentrator.  Thanks, JD

## 2023-12-06 MED ORDER — MEXILETINE HCL 150 MG PO CAPS: 150.0000 mg | ORAL_CAPSULE | Freq: Three times a day (TID) | ORAL | 2 refills | Status: AC

## 2023-12-06 NOTE — Telephone Encounter (Signed)
 Spoke w/ PT VBU, already has a concentrator at home will start 2L at nigfht see you at next OV.   -NFN

## 2023-12-06 NOTE — Telephone Encounter (Signed)
 Spoke with patient who is aware that  per [facility] she can take 150 mg three times a day verses 200 mg which would be a new Rx. Patient was happy due to cost of over $100 she has already spent. New Rx sent to pharmacy for updated dose.

## 2023-12-12 DIAGNOSIS — E782 Mixed hyperlipidemia: Secondary | ICD-10-CM | POA: Diagnosis not present

## 2023-12-12 DIAGNOSIS — I25119 Atherosclerotic heart disease of native coronary artery with unspecified angina pectoris: Secondary | ICD-10-CM | POA: Diagnosis not present

## 2023-12-12 DIAGNOSIS — N1832 Chronic kidney disease, stage 3b: Secondary | ICD-10-CM | POA: Diagnosis not present

## 2023-12-12 DIAGNOSIS — Z7982 Long term (current) use of aspirin: Secondary | ICD-10-CM | POA: Diagnosis not present

## 2023-12-12 DIAGNOSIS — R911 Solitary pulmonary nodule: Secondary | ICD-10-CM | POA: Diagnosis not present

## 2023-12-12 DIAGNOSIS — M069 Rheumatoid arthritis, unspecified: Secondary | ICD-10-CM | POA: Diagnosis not present

## 2023-12-12 DIAGNOSIS — I5033 Acute on chronic diastolic (congestive) heart failure: Secondary | ICD-10-CM | POA: Diagnosis not present

## 2023-12-12 DIAGNOSIS — I130 Hypertensive heart and chronic kidney disease with heart failure and stage 1 through stage 4 chronic kidney disease, or unspecified chronic kidney disease: Secondary | ICD-10-CM | POA: Diagnosis not present

## 2023-12-12 DIAGNOSIS — J9601 Acute respiratory failure with hypoxia: Secondary | ICD-10-CM | POA: Diagnosis not present

## 2023-12-12 DIAGNOSIS — J449 Chronic obstructive pulmonary disease, unspecified: Secondary | ICD-10-CM | POA: Diagnosis not present

## 2023-12-12 DIAGNOSIS — Z9181 History of falling: Secondary | ICD-10-CM | POA: Diagnosis not present

## 2023-12-12 DIAGNOSIS — N179 Acute kidney failure, unspecified: Secondary | ICD-10-CM | POA: Diagnosis not present

## 2023-12-12 DIAGNOSIS — I272 Pulmonary hypertension, unspecified: Secondary | ICD-10-CM | POA: Diagnosis not present

## 2023-12-17 ENCOUNTER — Other Ambulatory Visit: Payer: Self-pay | Admitting: Family Medicine

## 2023-12-18 DIAGNOSIS — I130 Hypertensive heart and chronic kidney disease with heart failure and stage 1 through stage 4 chronic kidney disease, or unspecified chronic kidney disease: Secondary | ICD-10-CM | POA: Diagnosis not present

## 2023-12-18 DIAGNOSIS — Z7982 Long term (current) use of aspirin: Secondary | ICD-10-CM | POA: Diagnosis not present

## 2023-12-18 DIAGNOSIS — R911 Solitary pulmonary nodule: Secondary | ICD-10-CM | POA: Diagnosis not present

## 2023-12-18 DIAGNOSIS — E782 Mixed hyperlipidemia: Secondary | ICD-10-CM | POA: Diagnosis not present

## 2023-12-18 DIAGNOSIS — Z9181 History of falling: Secondary | ICD-10-CM | POA: Diagnosis not present

## 2023-12-18 DIAGNOSIS — I272 Pulmonary hypertension, unspecified: Secondary | ICD-10-CM | POA: Diagnosis not present

## 2023-12-18 DIAGNOSIS — I5033 Acute on chronic diastolic (congestive) heart failure: Secondary | ICD-10-CM | POA: Diagnosis not present

## 2023-12-18 DIAGNOSIS — J9601 Acute respiratory failure with hypoxia: Secondary | ICD-10-CM | POA: Diagnosis not present

## 2023-12-18 DIAGNOSIS — M069 Rheumatoid arthritis, unspecified: Secondary | ICD-10-CM | POA: Diagnosis not present

## 2023-12-18 DIAGNOSIS — I25119 Atherosclerotic heart disease of native coronary artery with unspecified angina pectoris: Secondary | ICD-10-CM | POA: Diagnosis not present

## 2023-12-18 DIAGNOSIS — J449 Chronic obstructive pulmonary disease, unspecified: Secondary | ICD-10-CM | POA: Diagnosis not present

## 2023-12-18 DIAGNOSIS — N1832 Chronic kidney disease, stage 3b: Secondary | ICD-10-CM | POA: Diagnosis not present

## 2023-12-18 DIAGNOSIS — N179 Acute kidney failure, unspecified: Secondary | ICD-10-CM | POA: Diagnosis not present

## 2023-12-25 DIAGNOSIS — J9601 Acute respiratory failure with hypoxia: Secondary | ICD-10-CM | POA: Diagnosis not present

## 2023-12-25 DIAGNOSIS — I5033 Acute on chronic diastolic (congestive) heart failure: Secondary | ICD-10-CM | POA: Diagnosis not present

## 2023-12-25 DIAGNOSIS — E782 Mixed hyperlipidemia: Secondary | ICD-10-CM | POA: Diagnosis not present

## 2023-12-25 DIAGNOSIS — M069 Rheumatoid arthritis, unspecified: Secondary | ICD-10-CM | POA: Diagnosis not present

## 2023-12-25 DIAGNOSIS — J449 Chronic obstructive pulmonary disease, unspecified: Secondary | ICD-10-CM | POA: Diagnosis not present

## 2023-12-25 DIAGNOSIS — N1832 Chronic kidney disease, stage 3b: Secondary | ICD-10-CM | POA: Diagnosis not present

## 2023-12-25 DIAGNOSIS — I25119 Atherosclerotic heart disease of native coronary artery with unspecified angina pectoris: Secondary | ICD-10-CM | POA: Diagnosis not present

## 2023-12-25 DIAGNOSIS — I130 Hypertensive heart and chronic kidney disease with heart failure and stage 1 through stage 4 chronic kidney disease, or unspecified chronic kidney disease: Secondary | ICD-10-CM | POA: Diagnosis not present

## 2023-12-25 DIAGNOSIS — Z9181 History of falling: Secondary | ICD-10-CM | POA: Diagnosis not present

## 2023-12-25 DIAGNOSIS — R911 Solitary pulmonary nodule: Secondary | ICD-10-CM | POA: Diagnosis not present

## 2023-12-25 DIAGNOSIS — Z7982 Long term (current) use of aspirin: Secondary | ICD-10-CM | POA: Diagnosis not present

## 2023-12-25 DIAGNOSIS — N179 Acute kidney failure, unspecified: Secondary | ICD-10-CM | POA: Diagnosis not present

## 2023-12-25 DIAGNOSIS — I272 Pulmonary hypertension, unspecified: Secondary | ICD-10-CM | POA: Diagnosis not present

## 2024-01-07 NOTE — Progress Notes (Unsigned)
 Cardiology Office Note:  .   Date:  01/07/2024  ID:  [name] [name], DOB [birth year], MRN [id number] PCP: [name] [name] LABOR, MD  [name] Providers Cardiologist:  [name] Cash, MD { EP: Dr. Kelsie >>> plan for Dr. Cindie   History of Present Illness: .   [name] [name] is a 73 y.o. female w/PMHx of  COPD, p.HTN HLD, HTN, CKD (IIIa) CAD HFpEF PVCs  Last saw EP May 2021, doing well on flecainide      admitted 3/17-07/06/23 with dyspnea and hypoxia requiring BiPAP. This was felt due to acute on chronic HFpEF.  -- 2D echo showed EF 60-65%, G1DD, interventricular septum is flattened in systole and diastole, consistent with right ventricular pressure and volume overload, mildly reduced RV function,  -- aortic sclerosis. CTA neg for PE, + small pleural effusions, scattered pulmonary nodules, 4cm ascending TAA, coronary calcifications. With diuresis she developed AKI and hypotension so required fluid back.  -- Cardiac cath 06/22/23 showed 50-60% mLAD, moderate pHTN, preserved cardiac output, recommended for medical therapy  -- Flecainide  was transitioned to mexiletine by EP given CAD.   Looks like Dr. Cindie saw her for weekend coverage during this stay, though no formal EP consult noted  She saw D.Dunn, PA 10/03/23, reports she saw HF team in follow-up 4/30 who suspected residual edema was due to to amlodipine . She was not felt to be a candidate for SGLT2i with urinary retention. Pt noted mild edema, in general, significantly improved, + DOE Noted PVCs, planned for labs rec EP f/u  I saw her Aug 2025 She is doing OK Says from her heart standpoint continues to feel like she is doing so much better No CP Infrequently has an awareness of a different heart beat. No rest SOB, + DOE at her new baseline (much improved) No dizzy spells, near syncope or syncope She mentions that she recently failed her oxygen test that her levels dropped into the 80's > has been rx PRN  oxygen  She doesn't quite remember all the way back to when she got started on flecainide  years ago, but doesn't recall ever being particularly aware of palpitations or any overwhelming symptoms.  Planned for monitor to get an idea of PVC burden Perhaps with O2, burden would be improved as well.  PVC burden was 27.7% > mexiletine increased to 200mg  BID  Today's visit is scheduled to f/u monitor findings ROS:   *** PVCs? *** tol increased dose? *** did she get the message to increase her dose?  Arrhythmia/AAD hx PVCs Flecainide  started 2016, stopped March 2025 2/2 CAD Mexiletine started March 2025  Studies Reviewed: [name]    EKG done today and reviewed by myself ***  06/19/23: TTE 1. Left ventricular ejection fraction, by estimation, is 60 to 65%. The  left ventricle has normal function. The left ventricle has no regional  wall motion abnormalities. Left ventricular diastolic parameters are  consistent with Grade I diastolic  dysfunction (impaired relaxation). There is the interventricular septum is  flattened in systole and diastole, consistent with right ventricular  pressure and volume overload.   2. Right ventricular systolic function is mildly reduced. The right  ventricular size is normal.   3. The mitral valve is normal in structure. No evidence of mitral valve  regurgitation. No evidence of mitral stenosis.   4. The aortic valve is tricuspid. Aortic valve regurgitation is trivial.  Aortic valve sclerosis is present, with no evidence of aortic valve  stenosis.   5. The  inferior vena cava is normal in size with greater than 50%  respiratory variability, suggesting right atrial pressure of 3 mmHg.     LHC/RHC 06/22/2023 1.  Patent left main with no significant stenosis 2.  Patent LAD with diffuse calcification and moderate mid vessel stenosis of 50 to 60% 3.  Patent circumflex with no significant stenosis 4.  Large, dominant RCA with diffuse calcification but no  significant stenosis 5.  Normal LVEDP 6.  Moderate pulmonary hypertension with PA pressure 65/33 mean 45 mmHg, transpulmonary gradient 25 mmHg, PVR 6 Wood units 7.  Preserved cardiac output of 4 L/min with a cardiac index of 2.2   Recommend: medical therapy, no indication for PCI, further evaluation/treatment of pulmonary HTN per inpatient team   Risk Assessment/Calculations:    Physical Exam:   VS:  There were no vitals taken for this visit.   Wt Readings from Last 3 Encounters:  11/09/23 185 lb 12.8 oz (84.3 kg)  10/11/23 184 lb (83.5 kg)  10/03/23 186 lb (84.4 kg)    GEN: Well nourished, well developed in no acute distress NECK: No JVD; No carotid bruits CARDIAC: *** RRR, she has occasional extrasystoles, no murmurs, rubs, gallops RESPIRATORY: *** CTA b/l without rales, wheezing or rhonchi  ABDOMEN: Soft, non-tender, non-distended EXTREMITIES: ***  No edema; No deformity   ASSESSMENT AND PLAN: .    PVCs Not particularly symptomatic Tolerating mexiletine ***    Dispo: back in *** mo, sooner if needed pending monitor findings  Signed, [name] Macario Arthur, PA-C

## 2024-01-09 ENCOUNTER — Ambulatory Visit: Attending: Cardiology | Admitting: Physician Assistant

## 2024-01-09 VITALS — BP 117/76 | HR 101 | Ht 62.0 in | Wt 183.0 lb

## 2024-01-09 DIAGNOSIS — I2510 Atherosclerotic heart disease of native coronary artery without angina pectoris: Secondary | ICD-10-CM | POA: Diagnosis not present

## 2024-01-09 DIAGNOSIS — I5033 Acute on chronic diastolic (congestive) heart failure: Secondary | ICD-10-CM | POA: Diagnosis not present

## 2024-01-09 DIAGNOSIS — I493 Ventricular premature depolarization: Secondary | ICD-10-CM | POA: Diagnosis not present

## 2024-01-09 MED ORDER — METOPROLOL SUCCINATE ER 25 MG PO TB24: 25.0000 mg | ORAL_TABLET | Freq: Every day | ORAL | 1 refills | Status: DC

## 2024-01-09 NOTE — Patient Instructions (Addendum)
 Medication Instructions:   START TAKING : METOPROLOL   25 MG ONCE A DAY     *If you need a refill on your cardiac medications before your next appointment, please call your pharmacy*   Lab Work: NONE ORDERED  TODAY    If you have labs (blood work) drawn today and your tests are completely normal, you will receive your results only by: MyChart Message (if you have MyChart) OR A paper copy in the mail If you have any lab test that is abnormal or we need to change your treatment, we will call you to review the results.  Testing/Procedures: NONE ORDERED  TODAY    Follow-Up: At [facility], you and your health needs are our priority.  As part of our continuing mission to provide you with exceptional heart care, our providers are all part of one team.  This team includes your primary Cardiologist (physician) and Advanced Practice Providers or APPs (Physician Assistants and Nurse Practitioners) who all work together to provide you with the care you need, when you need it.  Your next appointment:  6 -8 week(s)  Provider:  Charlies Arthur, PA-C   ( CONTACT  CASSIE HALL/ ANGELINE HAMMER FOR EP SCHEDULING ISSUES )   We recommend signing up for the patient portal called MyChart.  Sign up information is provided on this After Visit Summary.  MyChart is used to connect with patients for Virtual Visits (Telemedicine).  Patients are able to view lab/test results, encounter notes, upcoming appointments, etc.  Non-urgent messages can be sent to your provider as well.   To learn more about what you can do with MyChart, go to [email or web address].   Other Instructions

## 2024-01-11 ENCOUNTER — Other Ambulatory Visit: Payer: Self-pay

## 2024-01-14 MED ORDER — MAGNESIUM OXIDE -MG SUPPLEMENT 400 (240 MG) MG PO TABS: 1.0000 | ORAL_TABLET | Freq: Every day | ORAL | 2 refills | Status: AC

## 2024-01-23 ENCOUNTER — Other Ambulatory Visit (HOSPITAL_COMMUNITY): Payer: Self-pay

## 2024-01-23 ENCOUNTER — Encounter: Payer: Self-pay | Admitting: Pulmonary Disease

## 2024-01-23 ENCOUNTER — Telehealth: Payer: Self-pay | Admitting: Pulmonary Disease

## 2024-01-23 ENCOUNTER — Ambulatory Visit (INDEPENDENT_AMBULATORY_CARE_PROVIDER_SITE_OTHER): Admitting: Pulmonary Disease

## 2024-01-23 ENCOUNTER — Telehealth: Payer: Self-pay

## 2024-01-23 VITALS — BP 144/79 | HR 55 | Ht 62.0 in | Wt 182.0 lb

## 2024-01-23 DIAGNOSIS — R918 Other nonspecific abnormal finding of lung field: Secondary | ICD-10-CM

## 2024-01-23 DIAGNOSIS — J9611 Chronic respiratory failure with hypoxia: Secondary | ICD-10-CM

## 2024-01-23 DIAGNOSIS — I2510 Atherosclerotic heart disease of native coronary artery without angina pectoris: Secondary | ICD-10-CM | POA: Diagnosis not present

## 2024-01-23 DIAGNOSIS — Z87891 Personal history of nicotine dependence: Secondary | ICD-10-CM | POA: Diagnosis not present

## 2024-01-23 DIAGNOSIS — J449 Chronic obstructive pulmonary disease, unspecified: Secondary | ICD-10-CM | POA: Diagnosis not present

## 2024-01-23 DIAGNOSIS — K219 Gastro-esophageal reflux disease without esophagitis: Secondary | ICD-10-CM

## 2024-01-23 MED ORDER — BREZTRI AEROSPHERE 160-9-4.8 MCG/ACT IN AERO: INHALATION_SPRAY | RESPIRATORY_TRACT | Status: DC

## 2024-01-23 NOTE — Telephone Encounter (Signed)
 Thank you so much

## 2024-01-23 NOTE — Telephone Encounter (Signed)
 Please check which ICS/LABA and LAMA inhalers are best covered by her insurance.  Also Check is Trelegy or Breztri  are covered.  Thanks, JD

## 2024-01-23 NOTE — Patient Instructions (Addendum)
 Try breztri  inhaler 2 puffs twice daily - rinse mouth out after each use  Use albuterol  inhaler 1-2 puffs every 4-6 hours as needed  We will be in touch once we hear from our pharmacy team about inhaler costs  Follow up in 6 months

## 2024-01-23 NOTE — Progress Notes (Signed)
 Synopsis: Referred in July 2025 for COPD  Subjective:   PATIENT ID: [name] [name] GENDER: female DOB: Apr 28, 1950, MRN: [id number]  HPI  Chief Complaint  Patient presents with   Medical Management of Chronic Issues    Pt states same stuff feels like can't breath at times    [name] [name] is a 73 year old woman, former smoker with GERD, CAD and hypertension who returns to pulmonary clinic for COPD.   Initial OV 10/11/23 She was admitted 3/18 to 4/4 for respiratory failure due to heart failure exacerbation. She did have lower extremity edema which is much improved.  PFTs 10/10/23 showed moderate obstruction, air trapping and diffusion defect.  She has significant exertional dyspnea. She does have cough and wheezing. She has as needed albuterol  but denies much relief. Reports not being discharged on oxygen. She is working with home PT. She lives at home alone. Her roommate passed away a few months ago. She has neighbors who help her. She has food delivered to the home.  She has trouble sleeping at night due to dyspnea.   She is a former smoker. Quit in 2017. She has history of pulmonary nodules, which have been stable on imaging since 2022. Recommended follow up in 06/2024.  OV 01/23/24 She reports having on going issues with shortness of breath. She has been using POC. She did note benefit from breztri  from last visit. She does report some relief from albuterol  rescue inhaler.    Past Medical History:  Diagnosis Date   Allergic rhinitis    ALLERGIC RHINITIS 05/08/2007   Qualifier: Diagnosis of  By: Delos, CMA, Cindy     Allergy    seasonal   Anxiety    Anxiety state 05/25/2014   Aortic valve disease    a. ? possible bicuspid AV.   CAD (coronary artery disease)    a. nonobstructive by prior cath   Cataract    bilateral   Chronic low back pain 03/09/2016   Complication of anesthesia 2003   Medication for block went up to my brain and I stopped breathing   COPD (chronic  obstructive pulmonary disease) (HCC)    COPD (chronic obstructive pulmonary disease) with chronic bronchitis (HCC) 10/12/2014   Depression    Depression, major, single episode, moderate (HCC) 05/08/2007   Qualifier: Diagnosis of  By: Delos, CMA, Cindy     Dexamethasone adverse reaction    2002 normal   Dilated aortic root    a. mild dilated aortic root on prior echo, ascending aorta prominence on CT 2015.   Duodenal mass    GERD 05/08/2007   Qualifier: Diagnosis of  By: Delos, CMA, Cindy     Hyperlipidemia    Hypertension    Insomnia    Neck pain, chronic 03/09/2016   NICOTINE ADDICTION 05/08/2007   Qualifier: Diagnosis of  By: Johnny MD, Garnette LABOR    Osteoarthritis    Overactive bladder    Pneumonia    once or twivce in past   Premature ventricular contractions    LBBB inferior axis PVCs   Thoracic back pain 05/30/2014   Tobacco abuse    Ulcer    duodenal     Family History  Problem Relation Age of Onset   CAD Brother    Kidney disease Brother    Emphysema Mother    Other Father        brain tumor   Throat cancer Brother    Hyperlipidemia Other    Hypertension Other  Kidney disease Other    Heart disease Other    Colon cancer Neg Hx    Esophageal cancer Neg Hx    Stomach cancer Neg Hx    Rectal cancer Neg Hx      Social History   Socioeconomic History   Marital status: Single    Spouse name: Not on file   Number of children: 0   Years of education: Not on file   Highest education level: High school graduate  Occupational History   Occupation: CNA   Occupation: retired  Tobacco Use   Smoking status: Former    Current packs/day: 0.00    Average packs/day: 0.5 packs/day for 54.0 years (24.3 ttl pk-yrs)    Types: Cigarettes    Start date: 01/02/1963    Quit date: 01/01/2017    Years since quitting: 7.0   Smokeless tobacco: Never   Tobacco comments:    1/2 pack per day or less  Vaping Use   Vaping status: Never Used  Substance and Sexual Activity   Alcohol  use: No    Alcohol/week: 0.0 standard drinks of alcohol   Drug use: No   Sexual activity: Not on file  Other Topics Concern   Not on file  Social History Narrative   Works as LAWYER at [employer].   Social Drivers of Corporate Investment Banker Strain: Low Risk  (06/26/2023)   Overall Financial Resource Strain (CARDIA)    Difficulty of Paying Living Expenses: Not very hard  Food Insecurity: No Food Insecurity (06/19/2023)   Hunger Vital Sign    Worried About Running Out of Food in the Last Year: Never true    Ran Out of Food in the Last Year: Never true  Transportation Needs: No Transportation Needs (06/26/2023)   PRAPARE - Administrator, Civil Service (Medical): No    Lack of Transportation (Non-Medical): No  Physical Activity: Inactive (05/14/2023)   Exercise Vital Sign    Days of Exercise per Week: 0 days    Minutes of Exercise per Session: 0 min  Stress: No Stress Concern Present (05/14/2023)   [employer] of Occupational Health - Occupational Stress Questionnaire    Feeling of Stress : Not at all  Social Connections: Socially Isolated (06/19/2023)   Social Connection and Isolation Panel    Frequency of Communication with Friends and Family: Once a week    Frequency of Social Gatherings with Friends and Family: Never    Attends Religious Services: Never    Database Administrator or Organizations: No    Attends Banker Meetings: Never    Marital Status: Never married  Intimate Partner Violence: Not At Risk (06/19/2023)   Humiliation, Afraid, Rape, and Kick questionnaire    Fear of Current or Ex-Partner: No    Emotionally Abused: No    Physically Abused: No    Sexually Abused: No     Allergies  Allergen Reactions   Augmentin  [Amoxicillin -Pot Clavulanate] Nausea And Vomiting   Clindamycin/Lincomycin Nausea And Vomiting   Colchicine  Diarrhea   Flecainide  Other (See Comments)    Conjunctivitis    Hydroxychloroquine Rash     Outpatient  Medications Prior to Visit  Medication Sig Dispense Refill   albuterol  (PROAIR  HFA) 108 (90 Base) MCG/ACT inhaler Inhale 2 puffs into the lungs every 6 (six) hours as needed for wheezing. 8.5 each 11   ALPRAZolam  (XANAX ) 0.25 MG tablet TAKE 1 TABLET (0.25 MG TOTAL) BY MOUTH 3 (THREE) TIMES DAILY  AS NEEDED FOR ANXIETY. TAKE 3 TABLETS BY MOUTH EVERY 6 HOURS AS NEEDED 90 tablet 5   ARIPiprazole  (ABILIFY ) 2 MG tablet GIVE TAKE 1 TABLET BY MOUTH ONE TIME A DAY RELATED TO TO MAJOR DEPRESSIVE DISORDER. 30 tablet 11   aspirin  EC 81 MG tablet Take 1 tablet (81 mg total) by mouth daily. 90 tablet 3   atorvastatin  (LIPITOR) 20 MG tablet Take 1 tablet (20 mg total) by mouth daily. 30 tablet 0   cetirizine (ZYRTEC) 10 MG tablet Take 10 mg by mouth daily as needed for allergies.      cholecalciferol (VITAMIN D3) 25 MCG (1000 UNIT) tablet Take 1,000 Units by mouth daily.     cyclobenzaprine  (FLEXERIL ) 10 MG tablet GIVE 0.5 TABLET BY MOUTH 3 TIMES A DAY FOR MUSCLE SPASM RELIEF 45 tablet 5   furosemide  (LASIX ) 40 MG tablet Take 40 mg by mouth daily.     lidocaine  (HM LIDOCAINE  PATCH) 4 % Place 1 patch onto the skin daily.     magnesium  oxide (MAG-OX) 400 (240 Mg) MG tablet Take 1 tablet (400 mg total) by mouth daily. 90 tablet 2   MELATONIN PO Take by mouth at bedtime. OTC     metoprolol  succinate (TOPROL  XL) 25 MG 24 hr tablet Take 1 tablet (25 mg total) by mouth daily. 90 tablet 1   mexiletine (MEXITIL ) 150 MG capsule Take 1 capsule (150 mg total) by mouth 3 (three) times daily. 270 capsule 2   nitroGLYCERIN  (NITROSTAT ) 0.4 MG SL tablet Place 1 tablet (0.4 mg total) under the tongue every 5 (five) minutes as needed for chest pain. 25 tablet 3   senna-docusate (SENOKOT-S) 8.6-50 MG tablet Take 1 tablet by mouth 2 (two) times daily. 20 tablet 0   spironolactone  (ALDACTONE ) 25 MG tablet Take 1 tablet (25 mg total) by mouth daily. 90 tablet 2   traMADol  (ULTRAM ) 50 MG tablet Take 1 tablet (50 mg total) by mouth  every 6 (six) hours as needed for moderate pain (pain score 4-6). 20 tablet 0   budesonide -glycopyrrolate -formoterol  (BREZTRI  AEROSPHERE) 160-9-4.8 MCG/ACT AERO inhaler 4 samples     No facility-administered medications prior to visit.   Review of Systems  Constitutional:  Negative for chills, fever, malaise/fatigue and weight loss.  HENT:  Negative for congestion, sinus pain and sore throat.   Eyes: Negative.   Respiratory:  Positive for cough, sputum production, shortness of breath and wheezing. Negative for hemoptysis.   Cardiovascular:  Negative for chest pain, palpitations, orthopnea, claudication and leg swelling.  Gastrointestinal:  Negative for abdominal pain, heartburn, nausea and vomiting.  Genitourinary: Negative.   Musculoskeletal:  Negative for joint pain and myalgias.  Skin:  Negative for rash.  Neurological:  Negative for weakness.  Endo/Heme/Allergies: Negative.   Psychiatric/Behavioral: Negative.     Objective:   Vitals:   01/23/24 1415  BP: (!) 144/79  Pulse: (!) 55  SpO2: 94%  Weight: 182 lb (82.6 kg)  Height: 5' 2 (1.575 m)   Physical Exam Constitutional:      General: She is not in acute distress.    Appearance: Normal appearance. She is obese.  Eyes:     General: No scleral icterus.    Conjunctiva/sclera: Conjunctivae normal.  Cardiovascular:     Rate and Rhythm: Normal rate and regular rhythm.  Pulmonary:     Breath sounds: No wheezing, rhonchi or rales.  Musculoskeletal:     Right lower leg: No edema.     Left lower leg: No  edema.  Skin:    General: Skin is warm and dry.  Neurological:     General: No focal deficit present.     CBC    Component Value Date/Time   WBC 8.4 06/22/2023 0305   RBC 4.24 06/22/2023 0305   HGB 14.4 11/15/2023 0000   HGB 14.6 01/28/2019 1648   HCT 40.0 06/22/2023 1647   HCT 44.9 01/28/2019 1648   PLT 299 06/22/2023 0305   PLT 299 01/28/2019 1648   MCV 97.4 06/22/2023 0305   MCV 94 01/28/2019 1648   MCH  30.2 06/22/2023 0305   MCHC 31.0 06/22/2023 0305   RDW 13.4 06/22/2023 0305   RDW 12.8 01/28/2019 1648   LYMPHSABS 2.1 06/22/2023 0305   MONOABS 0.9 06/22/2023 0305   EOSABS 0.5 06/22/2023 0305   BASOSABS 0.1 06/22/2023 0305      Latest Ref Rng & Units 11/15/2023   12:00 AM 10/03/2023    3:37 PM 08/10/2023    3:08 PM  BMP  Glucose 70 - 99 mg/dL  82  94   BUN 8 - 27 mg/dL  30  25   Creatinine 0.5 - 1.1 1.6     1.50  1.48   BUN/Creat Ratio 12 - 28  20    Sodium 134 - 144 mmol/L  141  138   Potassium 3.5 - 5.1 mEq/L 4.4     4.2  4.5   Chloride 96 - 106 mmol/L  99  102   CO2 20 - 29 mmol/L  23  24   Calcium  8.7 - 10.3 mg/dL  9.4  9.8      This result is from an external source.   Chest imaging: CTA Chest 06/18/23 Mediastinum/Nodes: No enlarged mediastinal, hilar, or axillary lymph nodes. Thyroid  gland, trachea, and esophagus demonstrate no significant findings.   Lungs/Pleura: Scattered atelectasis or scarring is noted bilaterally. There are small bilateral pleural effusions with dependent atelectasis in the lower lobes. A nodular opacity is noted in the right middle lobe measuring 1.1 cm, versus 1.0 cm on the previous exam. There is a 7 mm nodule in the left lower lobe, versus 6 mm on the previous exam, axial image 72. No new nodule is seen. No pneumothorax.  PFT:    Latest Ref Rng & Units 10/10/2023    2:04 PM  PFT Results  FVC-Pre L 1.83   FVC-Predicted Pre % 70   FVC-Post L 1.88   FVC-Predicted Post % 71   Pre FEV1/FVC % % 58   Post FEV1/FCV % % 56   FEV1-Pre L 1.06   FEV1-Predicted Pre % 54   FEV1-Post L 1.05   DLCO uncorrected ml/min/mmHg 7.71   DLCO UNC% % 43   DLVA Predicted % 61   TLC L 5.17   TLC % Predicted % 108   RV % Predicted % 153     Labs:  Path:  Echo: LV EF 60-65%. Flattened interventricular septum. RV systolic function is mildly reduced  Heart Catheterization:    Assessment & Plan:   Chronic obstructive pulmonary disease, unspecified  COPD type (HCC) - Plan: CT Chest Wo Contrast, budesonide -glycopyrrolate -formoterol  (BREZTRI  AEROSPHERE) 160-9-4.8 MCG/ACT AERO inhaler  Chronic hypoxemic respiratory failure (HCC)  Discussion: Caili Escalera is a 73 year old woman, former smoker with GERD, CAD and hypertension who is referred to pulmonary clinic for COPD.   COPD - use breztri  inhaler, 2 puffs twice daily - rinse mouth out after each use - inhaler cost review performed, documented  in phone note 01/23/24 - use albuterol  inhaler as needed - She is to use 2L O2 at night based on ONO - Continue 2L pulsed via POC with ambulation  Lung nodules - plan for follow up CT Chest in 06/2024, will place order at follow up visit  HFrEF - managed by cardiology team  Follow up in 6 months.  Dorn Chill, MD [facility] Office: [phone]   Current Outpatient Medications:    albuterol  (PROAIR  HFA) 108 (90 Base) MCG/ACT inhaler, Inhale 2 puffs into the lungs every 6 (six) hours as needed for wheezing., Disp: 8.5 each, Rfl: 11   ALPRAZolam  (XANAX ) 0.25 MG tablet, TAKE 1 TABLET (0.25 MG TOTAL) BY MOUTH 3 (THREE) TIMES DAILY AS NEEDED FOR ANXIETY. TAKE 3 TABLETS BY MOUTH EVERY 6 HOURS AS NEEDED, Disp: 90 tablet, Rfl: 5   ARIPiprazole  (ABILIFY ) 2 MG tablet, GIVE TAKE 1 TABLET BY MOUTH ONE TIME A DAY RELATED TO TO MAJOR DEPRESSIVE DISORDER., Disp: 30 tablet, Rfl: 11   aspirin  EC 81 MG tablet, Take 1 tablet (81 mg total) by mouth daily., Disp: 90 tablet, Rfl: 3   atorvastatin  (LIPITOR) 20 MG tablet, Take 1 tablet (20 mg total) by mouth daily., Disp: 30 tablet, Rfl: 0   budesonide -glycopyrrolate -formoterol  (BREZTRI  AEROSPHERE) 160-9-4.8 MCG/ACT AERO inhaler, Inhale 2 puffs into the lungs in the morning and at bedtime., Disp: 10.7 g, Rfl: 11   cetirizine (ZYRTEC) 10 MG tablet, Take 10 mg by mouth daily as needed for allergies. , Disp: , Rfl:    cholecalciferol (VITAMIN D3) 25 MCG (1000 UNIT) tablet, Take 1,000 Units by  mouth daily., Disp: , Rfl:    cyclobenzaprine  (FLEXERIL ) 10 MG tablet, GIVE 0.5 TABLET BY MOUTH 3 TIMES A DAY FOR MUSCLE SPASM RELIEF, Disp: 45 tablet, Rfl: 5   furosemide  (LASIX ) 40 MG tablet, Take 40 mg by mouth daily., Disp: , Rfl:    lidocaine  (HM LIDOCAINE  PATCH) 4 %, Place 1 patch onto the skin daily., Disp: , Rfl:    magnesium  oxide (MAG-OX) 400 (240 Mg) MG tablet, Take 1 tablet (400 mg total) by mouth daily., Disp: 90 tablet, Rfl: 2   MELATONIN PO, Take by mouth at bedtime. OTC, Disp: , Rfl:    metoprolol  succinate (TOPROL  XL) 25 MG 24 hr tablet, Take 1 tablet (25 mg total) by mouth daily., Disp: 90 tablet, Rfl: 1   mexiletine (MEXITIL ) 150 MG capsule, Take 1 capsule (150 mg total) by mouth 3 (three) times daily., Disp: 270 capsule, Rfl: 2   nitroGLYCERIN  (NITROSTAT ) 0.4 MG SL tablet, Place 1 tablet (0.4 mg total) under the tongue every 5 (five) minutes as needed for chest pain., Disp: 25 tablet, Rfl: 3   senna-docusate (SENOKOT-S) 8.6-50 MG tablet, Take 1 tablet by mouth 2 (two) times daily., Disp: 20 tablet, Rfl: 0   spironolactone  (ALDACTONE ) 25 MG tablet, Take 1 tablet (25 mg total) by mouth daily., Disp: 90 tablet, Rfl: 2   traMADol  (ULTRAM ) 50 MG tablet, Take 1 tablet (50 mg total) by mouth every 6 (six) hours as needed for moderate pain (pain score 4-6)., Disp: 20 tablet, Rfl: 0

## 2024-01-24 ENCOUNTER — Telehealth: Payer: Self-pay

## 2024-01-24 NOTE — Telephone Encounter (Signed)
 Spoke w/ patient about cost of breztri  VBU.   -NFN

## 2024-01-28 ENCOUNTER — Encounter: Payer: Self-pay | Admitting: Pulmonary Disease

## 2024-01-28 MED ORDER — BREZTRI AEROSPHERE 160-9-4.8 MCG/ACT IN AERO: 2.0000 | INHALATION_SPRAY | Freq: Two times a day (BID) | RESPIRATORY_TRACT | 11 refills | Status: AC

## 2024-01-28 NOTE — Telephone Encounter (Signed)
 Copied from CRM #[id number]. Topic: Clinical - Prescription Issue >> Jan 24, 2024  3:45 PM Rilla B wrote: Reason for CRM: Patient calling regarding the Breztri  inhaler and states it hasn't been send to pharmacy.  Asking that we send the prescription to [facility]. Please call patient to confirm prescription was sent [phone].  Called and spoke with the pt regarding Breztri  inhaler sent to pharmacy.  Nfn

## 2024-02-25 ENCOUNTER — Encounter (HOSPITAL_BASED_OUTPATIENT_CLINIC_OR_DEPARTMENT_OTHER): Payer: Self-pay

## 2024-02-26 ENCOUNTER — Encounter: Payer: Self-pay | Admitting: Cardiology

## 2024-02-26 ENCOUNTER — Ambulatory Visit: Attending: Physician Assistant | Admitting: Cardiology

## 2024-02-26 VITALS — BP 132/68 | HR 93 | Ht 62.0 in | Wt 188.0 lb

## 2024-02-26 DIAGNOSIS — I2510 Atherosclerotic heart disease of native coronary artery without angina pectoris: Secondary | ICD-10-CM | POA: Diagnosis not present

## 2024-02-26 DIAGNOSIS — I5033 Acute on chronic diastolic (congestive) heart failure: Secondary | ICD-10-CM | POA: Diagnosis not present

## 2024-02-26 DIAGNOSIS — J449 Chronic obstructive pulmonary disease, unspecified: Secondary | ICD-10-CM | POA: Diagnosis not present

## 2024-02-26 DIAGNOSIS — I493 Ventricular premature depolarization: Secondary | ICD-10-CM

## 2024-02-26 MED ORDER — METOPROLOL SUCCINATE ER 25 MG PO TB24: 25.0000 mg | ORAL_TABLET | Freq: Two times a day (BID) | ORAL | 3 refills | Status: DC

## 2024-02-26 NOTE — Patient Instructions (Addendum)
 Medication Instructions:    START TAKING :  METOPROLOL   25 MG TWICE  A  DAY     *If you need a refill on your cardiac medications before your next appointment, please call your pharmacy*    Lab Work: NONE ORDERED  TODAY     If you have labs (blood work) drawn today and your tests are completely normal, you will receive your results only by: MyChart Message (if you have MyChart) OR A paper copy in the mail If you have any lab test that is abnormal or we need to change your treatment, we will call you to review the results.    Testing/Procedures: NONE ORDERED  TODAY      Follow-Up: At [facility], you and your health needs are our priority.  As part of our continuing mission to provide you with exceptional heart care, our providers are all part of one team.  This team includes your primary Cardiologist (physician) and Advanced Practice Providers or APPs (Physician Assistants and Nurse Practitioners) who all work together to provide you with the care you need, when you need it.  Your next appointment:  4-6  WEEKS    Provider:   You may see   Charlies Arthur, PA-C   / ARTIST TRUDY  PA-C  ( CONTACT  CASSIE HALL/ ANGELINE HAMMER FOR EP SCHEDULING ISSUES )      We recommend signing up for the patient portal called MyChart.  Sign up information is provided on this After Visit Summary.  MyChart is used to connect with patients for Virtual Visits (Telemedicine).  Patients are able to view lab/test results, encounter notes, upcoming appointments, etc.  Non-urgent messages can be sent to your provider as well.   To learn more about what you can do with MyChart, go to [email or web address].   Other Instructions

## 2024-02-26 NOTE — Progress Notes (Signed)
 Electrophysiology Office Note:   Date:  02/26/2024  ID:  [name] [name], DOB [birth year], MRN [id number]  Primary Cardiologist: Lonni Cash, MD Electrophysiologist: None        History of Present Illness:   [name] [name] is a 73 y.o. female with h/o  nonobstructive CAD, chronic HFpEF, PVCs, ascending TAA (4cm 06/2023), HTN, HLD (managed by primary care), GERD, OA, depression/anxiety, fomer tobacco abuse, CKD stage IIIb, COPD, pulmonary nodules seen today for routine electrophysiology followup.   Patient followed by EP in the past, on Flecainide  for PVCs. Appears she was lost to EP follow-up starting from 2021. In March of this year, she was admitted with shortness of breath and hypoxia, required BiPAP. Symptoms attributed to acute on chronic HFpEF and she had an echo that admission showing preserved LVEF. Heart catheterization showed CAD with 50-60% stenosis of mid LAD, moderate pulmonary hypertension. Given newly noted CAD, Flecainide  was discontinued and Mexiletine started for PVC suppression.   EP saw patient for follow up with this medication change. A heart monitor was placed to assess PVC burden and patient found to have ~27% ventricular ectopy. Mexiletine was increased to 150mg  TID. In follow up after this change, patient reported intermittent awareness of her PVCs and was tolerating increased Mexiletine. Her Metoprolol  Succinate was increased to 25mg  given trigeminy on ECG in clinic.   Since last being seen in our clinic the patient reports doing about the same. She still regularly feels her PVCs, described as my heart dancing. Does not have associated dyspnea, chest tightness, or dizziness.   Regarding HF, reports stable volume status on current doses of Spironolactone  and Lasix .   Continues with chronic dyspnea due to COPD, though is noting improvement since starting Breztri . She now uses 2L O2 at night.  she denies chest pain, PND, orthopnea, nausea, vomiting,  dizziness, syncope, edema, or early satiety.   Review of systems complete and found to be negative unless listed in HPI.   EP Information / Studies Reviewed:    EKG is ordered today. Personal review as below.  EKG Interpretation Date/Time:  Tuesday February 26 2024 14:25:06 EST Ventricular Rate:  93 PR Interval:  170 QRS Duration:  70 QT Interval:  372 QTC Calculation: 462 R Axis:   20  Text Interpretation: Sinus rhythm with frequent Premature ventricular complexes in a pattern of bigeminy Confirmed by Trudy Birmingham [phone]) on 02/26/2024 2:33:23 PM    Arrhythmia/Device History No specialty comments available.   Physical Exam:   VS:  BP 132/68   Pulse 93   Ht 5' 2 (1.575 m)   Wt 188 lb (85.3 kg)   SpO2 93%   BMI 34.39 kg/m    Wt Readings from Last 3 Encounters:  02/26/24 188 lb (85.3 kg)  01/23/24 182 lb (82.6 kg)  01/09/24 183 lb (83 kg)     GEN: No acute distress NECK: No JVD; No carotid bruits CARDIAC: Regular rate and rhythm (auscultation consistent with bigeminy as seen on ECG), no murmurs, rubs, gallops RESPIRATORY:  Slightly distant but clear to auscultation without rales, wheezing or rhonchi  ABDOMEN: Soft, non-tender, non-distended EXTREMITIES:  No edema; No deformity   ASSESSMENT AND PLAN:    PVCs Ventricular Bigeminy Patient with history of PVCs has had increased frequency over the last several months despite starting Mexiletine and uptitrating beta blocker. Today's ECG shows ventricular bigeminy. Despite high burden of PVCs, patient's symptoms are relatively mild at this point, primarily sensation of palpitations. March 2025 TTE with preserved  LVEF. However long-term concern for LVEF reduction if PVC burden remains high. Medication options for treating her PVCs limited by co-morbidities. Amiodarone is a poor option with COPD. Sotalol is not a viable option with her CKD. Flecainide  contraindicated with her CAD. Patient reviewed with Dr. Almetta today, who  was also able to meet patient. He feels that PVC ablation could be a viable means of treatment and offered this as an option going forward. Patient wishes to think about this more before making a final decision. In the meantime, will increase her Toprol  to 25mg  BID. Continue Mexiletine 150mg  TID.   HFpEF Patient appears euvolemic today. Continue Lasix  and Spironolactone  per general cardiology.   CAD LHC this March noted 50-60% mLAD stenosis. No anginal symptoms reported. Continue ASA and statin.   COPD Patient now following with pulmonology, doing better with daily Breztri . Now using 2L O2 at night.      Follow up with EP APP 4-6 weeks or sooner if symptoms change.   Signed, Artist Pouch, PA-C

## 2024-03-06 NOTE — Telephone Encounter (Signed)
 NFN

## 2024-03-10 ENCOUNTER — Other Ambulatory Visit: Payer: Self-pay | Admitting: Family Medicine

## 2024-03-13 ENCOUNTER — Other Ambulatory Visit: Payer: Self-pay | Admitting: Family Medicine

## 2024-04-08 NOTE — Progress Notes (Unsigned)
 " Cardiology Office Note:  .   Date:  04/08/2024  ID:  [name] [name], DOB July 27, 1950, MRN [id number] PCP: [name] LABOR, MD  [facility] Providers Cardiologist:  Lonni Cash, MD { EP: Dr. Kelsie >>> plan for Dr. Cindie > ultimately Dr. Almetta   History of Present Illness: .   [name] [name] is a 74 y.o. female w/PMHx of  COPD, p.HTN HLD, HTN, CKD (IIIa) CAD HFpEF PVCs  Last saw EP May 2021, doing well on flecainide      admitted 3/17-07/06/23 with dyspnea and hypoxia requiring BiPAP. This was felt due to acute on chronic HFpEF.  -- 2D echo showed EF 60-65%, G1DD, interventricular septum is flattened in systole and diastole, consistent with right ventricular pressure and volume overload, mildly reduced RV function,  -- aortic sclerosis. CTA neg for PE, + small pleural effusions, scattered pulmonary nodules, 4cm ascending TAA, coronary calcifications. With diuresis she developed AKI and hypotension so required fluid back.  -- Cardiac cath 06/22/23 showed 50-60% mLAD, moderate pHTN, preserved cardiac output, recommended for medical therapy  -- Flecainide  was transitioned to mexiletine by EP given CAD.   Looks like Dr. Cindie saw her for weekend coverage during this stay, though no formal EP consult noted  She saw D.Dunn, PA 10/03/23, reports she saw HF team in follow-up 4/30 who suspected residual edema was due to to amlodipine . She was not felt to be a candidate for SGLT2i with urinary retention. Pt noted mild edema, in general, significantly improved, + DOE Noted PVCs, planned for labs rec EP f/u  I saw her Aug 2025 She is doing OK Says from her heart standpoint continues to feel like she is doing so much better No CP Infrequently has an awareness of a different heart beat. No rest SOB, + DOE at her new baseline (much improved) No dizzy spells, near syncope or syncope She mentions that she recently failed her oxygen test that her levels dropped into the  80's > has been rx PRN oxygen  She doesn't quite remember all the way back to when she got started on flecainide  years ago, but doesn't recall ever being particularly aware of palpitations or any overwhelming symptoms.  Planned for monitor to get an idea of PVC burden Perhaps with O2, burden would be improved as well.  PVC burden was 27.7% > mexiletine increased to 150mg  TID  I saw her 01/09/24 She is tolerating the mexiletine TID, though really only started that dosing schedule a week or so ago, with a delay 2/2 her insurance. She never really has much energy Using O2 at HS now as well as PRN No CP Intermittently aware of her PVCs, not always No near syncope or syncope Her magnesium  is OTC that she takes, uncertain, but thinks it is mag ox Her EKG PVCs/trigeminal, none appreciated on exam Her metoprolol  increased  She saw Artist 11/25.25 Reported regular palpitations heart dancing No cardiac symptoms or concerns otherwise Reported since starting Breztri , her COPD also doing better EKG w/V bigeminy Dr. Almetta saw her, discussed limitations of AAD given her CAD, COPD, and some CKD, felt she would be an ablation candidate if she wanted to pursue procedural strategy (she wanted to think about that) Her Toprol  increased further  Today's visit is scheduled to f/u monitor findings ROS:   Generally no energy, poor stamina. No CP, infrequent palpitations or awareness of her heart beat. No near syncope or syncope  She remains very weary of an ablation/procedural strategies, her  brother died during a heart cath procedure of some kind.  Arrhythmia/AAD hx PVCs Flecainide  started 2016, stopped March 2025 2/2 CAD Mexiletine started March 2025  Studies Reviewed: [name]    EKG done today and reviewed by myself SR w/V bigemeny  06/19/23: TTE 1. Left ventricular ejection fraction, by estimation, is 60 to 65%. The  left ventricle has normal function. The left ventricle has no regional  wall  motion abnormalities. Left ventricular diastolic parameters are  consistent with Grade I diastolic  dysfunction (impaired relaxation). There is the interventricular septum is  flattened in systole and diastole, consistent with right ventricular  pressure and volume overload.   2. Right ventricular systolic function is mildly reduced. The right  ventricular size is normal.   3. The mitral valve is normal in structure. No evidence of mitral valve  regurgitation. No evidence of mitral stenosis.   4. The aortic valve is tricuspid. Aortic valve regurgitation is trivial.  Aortic valve sclerosis is present, with no evidence of aortic valve  stenosis.   5. The inferior vena cava is normal in size with greater than 50%  respiratory variability, suggesting right atrial pressure of 3 mmHg.     LHC/RHC 06/22/2023 1.  Patent left main with no significant stenosis 2.  Patent LAD with diffuse calcification and moderate mid vessel stenosis of 50 to 60% 3.  Patent circumflex with no significant stenosis 4.  Large, dominant RCA with diffuse calcification but no significant stenosis 5.  Normal LVEDP 6.  Moderate pulmonary hypertension with PA pressure 65/33 mean 45 mmHg, transpulmonary gradient 25 mmHg, PVR 6 Wood units 7.  Preserved cardiac output of 4 L/min with a cardiac index of 2.2   Recommend: medical therapy, no indication for PCI, further evaluation/treatment of pulmonary HTN per inpatient team   Risk Assessment/Calculations:    Physical Exam:   VS:  There were no vitals taken for this visit.   Wt Readings from Last 3 Encounters:  02/26/24 188 lb (85.3 kg)  01/23/24 182 lb (82.6 kg)  01/09/24 183 lb (83 kg)    GEN: Well nourished, well developed in no acute distress NECK: No JVD; No carotid bruits CARDIAC: *** RRR, she has *** NO extrasystoles, no murmurs, rubs, gallops RESPIRATORY: CTA b/l without rales, wheezing or rhonchi  ABDOMEN: Soft, non-tender, non-distended EXTREMITIES: ***   No edema; No deformity   ASSESSMENT AND PLAN: .    PVCs intermittent awareness of palpitations Tolerating mexiletine/metoprolol   PVC burden seems to be worse if anything the last couple visits. Significant fatigue likely her PVCs and the BB She is really scared to pursue procedural strategy/ablation. Limited AAD options Will increase her Toprol  to 25mg  AM, 50mg  PM  Will have her see Dr. Almetta next, ~ 2 mo   3. HFpEF No symptoms or exam findings of volume OL C/w Dr. McAlhany/team  4.  CAD No anginal symptoms C/w Dr. McAlhany/team   Dispo: back in 2 mo, sooner if needed pending symptoms, medication tolerance  Signed, Charlies Macario Arthur, PA-C   "

## 2024-04-09 ENCOUNTER — Ambulatory Visit: Attending: Cardiovascular Disease | Admitting: Physician Assistant

## 2024-04-09 VITALS — BP 124/72 | HR 91 | Ht 62.0 in | Wt 191.0 lb

## 2024-04-09 DIAGNOSIS — I5033 Acute on chronic diastolic (congestive) heart failure: Secondary | ICD-10-CM

## 2024-04-09 DIAGNOSIS — I2510 Atherosclerotic heart disease of native coronary artery without angina pectoris: Secondary | ICD-10-CM | POA: Diagnosis not present

## 2024-04-09 DIAGNOSIS — I493 Ventricular premature depolarization: Secondary | ICD-10-CM

## 2024-04-09 MED ORDER — METOPROLOL SUCCINATE ER 25 MG PO TB24: 75.0000 mg | ORAL_TABLET | Freq: Every day | ORAL | 3 refills | Status: AC

## 2024-04-09 NOTE — Patient Instructions (Addendum)
 Medication Instructions:    START  TAKING :  METOPROLOL  25 MG IN THE MORNING     START TAKING  :  METOPROLOL   50  MG IN THE  EVENING    *If you need a refill on your cardiac medications before your next appointment, please call your pharmacy*  Lab Work: NONE ORDERED  TODAY   If you have labs (blood work) drawn today and your tests are completely normal, you will receive your results only by: MyChart Message (if you have MyChart) OR A paper copy in the mail If you have any lab test that is abnormal or we need to change your treatment, we will call you to review the results.  Testing/Procedures: NONE ORDERED  TODAY    Follow-Up: At [facility], you and your health needs are our priority.  As part of our continuing mission to provide you with exceptional heart care, our providers are all part of one team.  This team includes your primary Cardiologist (physician) and Advanced Practice Providers or APPs (Physician Assistants and Nurse Practitioners) who all work together to provide you with the care you need, when you need it.  Your next appointment:   2-3  month(s)  Provider:   Donnice Primus, MD  ( CONTACT  CASSIE HALL/ ANGELINE HAMMER FOR EP SCHEDULING ISSUES )       We recommend signing up for the patient portal called MyChart.  Sign up information is provided on this After Visit Summary.  MyChart is used to connect with patients for Virtual Visits (Telemedicine).  Patients are able to view lab/test results, encounter notes, upcoming appointments, etc.  Non-urgent messages can be sent to your provider as well.   To learn more about what you can do with MyChart, go to [email or web address].   Other Instructions

## 2024-04-10 ENCOUNTER — Encounter: Payer: Self-pay | Admitting: Cardiovascular Disease

## 2024-05-16 ENCOUNTER — Ambulatory Visit: Payer: Medicare Other
